# Patient Record
Sex: Female | Born: 1935
Health system: Southern US, Community
[De-identification: ages and names within clinical notes are randomized; demographics above are authoritative.]

## PROBLEM LIST (undated history)

## (undated) DIAGNOSIS — R110 Nausea: Secondary | ICD-10-CM

## (undated) DIAGNOSIS — D649 Anemia, unspecified: Secondary | ICD-10-CM

## (undated) DIAGNOSIS — K219 Gastro-esophageal reflux disease without esophagitis: Secondary | ICD-10-CM

## (undated) DIAGNOSIS — C801 Malignant (primary) neoplasm, unspecified: Secondary | ICD-10-CM

## (undated) DIAGNOSIS — M1990 Unspecified osteoarthritis, unspecified site: Secondary | ICD-10-CM

## (undated) DIAGNOSIS — T7840XA Allergy, unspecified, initial encounter: Secondary | ICD-10-CM

## (undated) DIAGNOSIS — N6012 Diffuse cystic mastopathy of left breast: Secondary | ICD-10-CM

## (undated) DIAGNOSIS — I10 Essential (primary) hypertension: Secondary | ICD-10-CM

## (undated) DIAGNOSIS — C4490 Unspecified malignant neoplasm of skin, unspecified: Secondary | ICD-10-CM

## (undated) DIAGNOSIS — K589 Irritable bowel syndrome without diarrhea: Secondary | ICD-10-CM

## (undated) DIAGNOSIS — E119 Type 2 diabetes mellitus without complications: Secondary | ICD-10-CM

## (undated) DIAGNOSIS — N6011 Diffuse cystic mastopathy of right breast: Secondary | ICD-10-CM

## (undated) DIAGNOSIS — F32A Depression, unspecified: Secondary | ICD-10-CM

## (undated) DIAGNOSIS — F329 Major depressive disorder, single episode, unspecified: Secondary | ICD-10-CM

## (undated) DIAGNOSIS — Z972 Presence of dental prosthetic device (complete) (partial): Secondary | ICD-10-CM

## (undated) DIAGNOSIS — M797 Fibromyalgia: Secondary | ICD-10-CM

## (undated) DIAGNOSIS — N3021 Other chronic cystitis with hematuria: Secondary | ICD-10-CM

## (undated) HISTORY — PX: ESOPHAGOGASTRODUODENOSCOPY: SHX1529

## (undated) HISTORY — PX: APPENDECTOMY: SHX54

## (undated) HISTORY — DX: Unspecified malignant neoplasm of skin, unspecified: C44.90

## (undated) HISTORY — PX: COLONOSCOPY: SHX174

## (undated) HISTORY — DX: Anemia, unspecified: D64.9

## (undated) HISTORY — PX: HERNIA REPAIR: SHX51

## (undated) HISTORY — PX: EYE SURGERY: SHX253

## (undated) HISTORY — PX: ABDOMINAL HYSTERECTOMY: SHX81

## (undated) HISTORY — PX: CHOLECYSTECTOMY: SHX55

---

## 1999-06-07 HISTORY — PX: BREAST BIOPSY: SHX20

## 1999-12-07 ENCOUNTER — Encounter: Admission: RE | Admit: 1999-12-07 | Discharge: 1999-12-07 | Payer: Self-pay | Admitting: General Surgery

## 1999-12-07 ENCOUNTER — Other Ambulatory Visit: Admission: RE | Admit: 1999-12-07 | Discharge: 1999-12-07 | Payer: Self-pay | Admitting: General Surgery

## 1999-12-07 ENCOUNTER — Encounter: Payer: Self-pay | Admitting: General Surgery

## 2000-12-11 ENCOUNTER — Encounter: Admission: RE | Admit: 2000-12-11 | Discharge: 2000-12-11 | Payer: Self-pay | Admitting: Radiology

## 2000-12-11 ENCOUNTER — Encounter: Payer: Self-pay | Admitting: Radiology

## 2001-12-12 ENCOUNTER — Encounter: Payer: Self-pay | Admitting: Family Medicine

## 2001-12-12 ENCOUNTER — Encounter: Admission: RE | Admit: 2001-12-12 | Discharge: 2001-12-12 | Payer: Self-pay | Admitting: Family Medicine

## 2002-12-11 ENCOUNTER — Encounter: Admission: RE | Admit: 2002-12-11 | Discharge: 2002-12-11 | Payer: Self-pay

## 2003-09-10 ENCOUNTER — Encounter: Admission: RE | Admit: 2003-09-10 | Discharge: 2003-09-10 | Payer: Self-pay | Admitting: Internal Medicine

## 2003-12-16 ENCOUNTER — Encounter: Admission: RE | Admit: 2003-12-16 | Discharge: 2003-12-16 | Payer: Self-pay | Admitting: Internal Medicine

## 2004-08-26 ENCOUNTER — Ambulatory Visit: Payer: Self-pay | Admitting: Internal Medicine

## 2004-12-16 ENCOUNTER — Ambulatory Visit: Payer: Self-pay | Admitting: Internal Medicine

## 2004-12-17 ENCOUNTER — Encounter: Admission: RE | Admit: 2004-12-17 | Discharge: 2004-12-17 | Payer: Self-pay | Admitting: Internal Medicine

## 2005-02-10 ENCOUNTER — Ambulatory Visit: Payer: Self-pay | Admitting: Unknown Physician Specialty

## 2005-02-15 ENCOUNTER — Ambulatory Visit: Payer: Self-pay | Admitting: Unknown Physician Specialty

## 2005-12-19 ENCOUNTER — Encounter: Admission: RE | Admit: 2005-12-19 | Discharge: 2005-12-19 | Payer: Self-pay | Admitting: Internal Medicine

## 2006-05-12 ENCOUNTER — Ambulatory Visit: Payer: Self-pay | Admitting: Internal Medicine

## 2006-10-16 ENCOUNTER — Ambulatory Visit: Payer: Self-pay | Admitting: Unknown Physician Specialty

## 2006-12-13 ENCOUNTER — Other Ambulatory Visit: Payer: Self-pay

## 2006-12-13 ENCOUNTER — Emergency Department: Payer: Self-pay | Admitting: Emergency Medicine

## 2006-12-21 ENCOUNTER — Encounter: Admission: RE | Admit: 2006-12-21 | Discharge: 2006-12-21 | Payer: Self-pay | Admitting: Internal Medicine

## 2007-12-24 ENCOUNTER — Encounter: Admission: RE | Admit: 2007-12-24 | Discharge: 2007-12-24 | Payer: Self-pay | Admitting: Internal Medicine

## 2008-12-24 ENCOUNTER — Encounter: Admission: RE | Admit: 2008-12-24 | Discharge: 2008-12-24 | Payer: Self-pay | Admitting: Internal Medicine

## 2009-02-12 ENCOUNTER — Ambulatory Visit: Payer: Self-pay | Admitting: Internal Medicine

## 2009-10-06 ENCOUNTER — Ambulatory Visit: Payer: Self-pay | Admitting: Unknown Physician Specialty

## 2009-11-03 ENCOUNTER — Ambulatory Visit: Payer: Self-pay | Admitting: Unknown Physician Specialty

## 2010-01-12 ENCOUNTER — Encounter: Admission: RE | Admit: 2010-01-12 | Discharge: 2010-01-12 | Payer: Self-pay | Admitting: Internal Medicine

## 2010-03-18 ENCOUNTER — Ambulatory Visit: Payer: Self-pay | Admitting: Internal Medicine

## 2010-12-22 ENCOUNTER — Other Ambulatory Visit: Payer: Self-pay | Admitting: Internal Medicine

## 2010-12-22 DIAGNOSIS — Z1231 Encounter for screening mammogram for malignant neoplasm of breast: Secondary | ICD-10-CM

## 2011-01-19 ENCOUNTER — Ambulatory Visit: Payer: Self-pay

## 2011-01-26 ENCOUNTER — Ambulatory Visit
Admission: RE | Admit: 2011-01-26 | Discharge: 2011-01-26 | Disposition: A | Discharge status: Home / Self Care | Payer: Medicare Other | Source: Ambulatory Visit | Attending: Internal Medicine | Admitting: Internal Medicine

## 2011-01-26 DIAGNOSIS — Z1231 Encounter for screening mammogram for malignant neoplasm of breast: Secondary | ICD-10-CM

## 2011-12-21 ENCOUNTER — Other Ambulatory Visit: Payer: Self-pay | Admitting: Internal Medicine

## 2011-12-21 DIAGNOSIS — Z1231 Encounter for screening mammogram for malignant neoplasm of breast: Secondary | ICD-10-CM

## 2012-02-01 ENCOUNTER — Ambulatory Visit
Admission: RE | Admit: 2012-02-01 | Discharge: 2012-02-01 | Disposition: A | Discharge status: Home / Self Care | Payer: Medicare Other | Source: Ambulatory Visit | Attending: Internal Medicine | Admitting: Internal Medicine

## 2012-02-01 DIAGNOSIS — Z1231 Encounter for screening mammogram for malignant neoplasm of breast: Secondary | ICD-10-CM

## 2012-03-22 ENCOUNTER — Other Ambulatory Visit: Payer: Self-pay | Admitting: Internal Medicine

## 2012-03-22 DIAGNOSIS — M549 Dorsalgia, unspecified: Secondary | ICD-10-CM

## 2012-03-29 ENCOUNTER — Ambulatory Visit
Admission: RE | Admit: 2012-03-29 | Discharge: 2012-03-29 | Disposition: A | Discharge status: Home / Self Care | Payer: Medicare Other | Source: Ambulatory Visit | Attending: Internal Medicine | Admitting: Internal Medicine

## 2012-03-29 DIAGNOSIS — M549 Dorsalgia, unspecified: Secondary | ICD-10-CM

## 2012-11-22 ENCOUNTER — Ambulatory Visit: Payer: Self-pay | Admitting: Internal Medicine

## 2012-12-05 ENCOUNTER — Ambulatory Visit: Payer: Self-pay | Admitting: Internal Medicine

## 2013-02-22 ENCOUNTER — Ambulatory Visit: Payer: Self-pay | Admitting: Orthopedic Surgery

## 2013-03-12 ENCOUNTER — Other Ambulatory Visit: Payer: Self-pay

## 2013-03-12 DIAGNOSIS — Z1231 Encounter for screening mammogram for malignant neoplasm of breast: Secondary | ICD-10-CM

## 2013-04-03 ENCOUNTER — Ambulatory Visit: Payer: Medicare Other

## 2013-04-05 ENCOUNTER — Ambulatory Visit
Admission: RE | Admit: 2013-04-05 | Discharge: 2013-04-05 | Disposition: A | Discharge status: Home / Self Care | Payer: Medicare Other | Source: Ambulatory Visit

## 2013-04-05 DIAGNOSIS — Z1231 Encounter for screening mammogram for malignant neoplasm of breast: Secondary | ICD-10-CM

## 2014-03-17 ENCOUNTER — Other Ambulatory Visit: Payer: Self-pay

## 2014-03-17 DIAGNOSIS — Z1231 Encounter for screening mammogram for malignant neoplasm of breast: Secondary | ICD-10-CM

## 2014-04-07 ENCOUNTER — Ambulatory Visit
Admission: RE | Admit: 2014-04-07 | Discharge: 2014-04-07 | Disposition: A | Discharge status: Home / Self Care | Payer: Commercial Managed Care - HMO | Source: Ambulatory Visit

## 2014-04-07 DIAGNOSIS — Z1231 Encounter for screening mammogram for malignant neoplasm of breast: Secondary | ICD-10-CM

## 2014-07-09 ENCOUNTER — Ambulatory Visit: Payer: Self-pay | Admitting: Internal Medicine

## 2014-12-25 ENCOUNTER — Other Ambulatory Visit: Payer: Self-pay | Admitting: Internal Medicine

## 2014-12-25 ENCOUNTER — Ambulatory Visit
Admission: RE | Admit: 2014-12-25 | Discharge: 2014-12-25 | Disposition: A | Discharge status: Home / Self Care | Payer: Medicare PPO | Source: Ambulatory Visit | Attending: Internal Medicine | Admitting: Internal Medicine

## 2014-12-25 DIAGNOSIS — M7989 Other specified soft tissue disorders: Secondary | ICD-10-CM | POA: Insufficient documentation

## 2014-12-25 DIAGNOSIS — I82812 Embolism and thrombosis of superficial veins of left lower extremities: Secondary | ICD-10-CM | POA: Insufficient documentation

## 2014-12-25 DIAGNOSIS — L97929 Non-pressure chronic ulcer of unspecified part of left lower leg with unspecified severity: Secondary | ICD-10-CM | POA: Diagnosis present

## 2015-02-05 ENCOUNTER — Other Ambulatory Visit: Payer: Self-pay | Admitting: Internal Medicine

## 2015-02-05 DIAGNOSIS — R1013 Epigastric pain: Secondary | ICD-10-CM

## 2015-02-13 ENCOUNTER — Ambulatory Visit
Admission: RE | Admit: 2015-02-13 | Discharge: 2015-02-13 | Disposition: A | Discharge status: Home / Self Care | Payer: Medicare PPO | Source: Ambulatory Visit | Attending: Internal Medicine | Admitting: Internal Medicine

## 2015-02-13 DIAGNOSIS — R1013 Epigastric pain: Secondary | ICD-10-CM | POA: Diagnosis present

## 2015-02-13 DIAGNOSIS — I700 Atherosclerosis of aorta: Secondary | ICD-10-CM | POA: Insufficient documentation

## 2015-02-13 DIAGNOSIS — Z9049 Acquired absence of other specified parts of digestive tract: Secondary | ICD-10-CM | POA: Diagnosis not present

## 2015-02-13 DIAGNOSIS — I728 Aneurysm of other specified arteries: Secondary | ICD-10-CM | POA: Diagnosis not present

## 2015-02-13 MED ORDER — IOHEXOL 300 MG/ML  SOLN
100.0000 mL | Freq: Once | INTRAMUSCULAR | Status: AC | PRN
  Administered 2015-02-13: 100 mL via INTRAVENOUS

## 2015-04-17 ENCOUNTER — Other Ambulatory Visit: Payer: Self-pay

## 2015-04-17 DIAGNOSIS — Z1231 Encounter for screening mammogram for malignant neoplasm of breast: Secondary | ICD-10-CM

## 2015-05-14 DIAGNOSIS — N3941 Urge incontinence: Secondary | ICD-10-CM | POA: Insufficient documentation

## 2015-05-14 DIAGNOSIS — N3020 Other chronic cystitis without hematuria: Secondary | ICD-10-CM | POA: Insufficient documentation

## 2015-05-25 ENCOUNTER — Ambulatory Visit
Admission: RE | Admit: 2015-05-25 | Discharge: 2015-05-25 | Disposition: A | Discharge status: Home / Self Care | Payer: Medicare PPO | Source: Ambulatory Visit

## 2015-05-25 DIAGNOSIS — Z1231 Encounter for screening mammogram for malignant neoplasm of breast: Secondary | ICD-10-CM

## 2015-07-28 ENCOUNTER — Other Ambulatory Visit: Payer: Self-pay | Admitting: Internal Medicine

## 2015-07-28 DIAGNOSIS — R1011 Right upper quadrant pain: Secondary | ICD-10-CM

## 2015-07-28 DIAGNOSIS — K5900 Constipation, unspecified: Secondary | ICD-10-CM

## 2015-07-28 DIAGNOSIS — D649 Anemia, unspecified: Secondary | ICD-10-CM

## 2015-07-30 ENCOUNTER — Other Ambulatory Visit: Payer: Self-pay | Admitting: *Deleted

## 2015-07-30 DIAGNOSIS — R1011 Right upper quadrant pain: Secondary | ICD-10-CM

## 2015-07-30 DIAGNOSIS — K219 Gastro-esophageal reflux disease without esophagitis: Secondary | ICD-10-CM | POA: Insufficient documentation

## 2015-07-31 ENCOUNTER — Ambulatory Visit: Payer: Medicare PPO

## 2015-08-07 ENCOUNTER — Ambulatory Visit
Admission: RE | Admit: 2015-08-07 | Discharge: 2015-08-07 | Disposition: A | Discharge status: Home / Self Care | Payer: Medicare PPO | Source: Ambulatory Visit | Attending: *Deleted | Admitting: *Deleted

## 2015-08-07 DIAGNOSIS — R1011 Right upper quadrant pain: Secondary | ICD-10-CM | POA: Diagnosis present

## 2015-08-07 DIAGNOSIS — Z9049 Acquired absence of other specified parts of digestive tract: Secondary | ICD-10-CM | POA: Diagnosis not present

## 2015-08-26 ENCOUNTER — Encounter: Payer: Self-pay | Admitting: *Deleted

## 2015-08-27 ENCOUNTER — Ambulatory Visit: Payer: Medicare PPO | Admitting: Anesthesiology

## 2015-08-27 ENCOUNTER — Encounter: Payer: Self-pay | Admitting: *Deleted

## 2015-08-27 ENCOUNTER — Encounter
Admission: RE | Disposition: A | Discharge status: Home / Self Care | Payer: Self-pay | Source: Ambulatory Visit | Attending: Unknown Physician Specialty

## 2015-08-27 ENCOUNTER — Ambulatory Visit
Admission: RE | Admit: 2015-08-27 | Discharge: 2015-08-27 | Disposition: A | Discharge status: Home / Self Care | Payer: Medicare PPO | Source: Ambulatory Visit | Attending: Unknown Physician Specialty | Admitting: Unknown Physician Specialty

## 2015-08-27 DIAGNOSIS — Z9049 Acquired absence of other specified parts of digestive tract: Secondary | ICD-10-CM | POA: Insufficient documentation

## 2015-08-27 DIAGNOSIS — Z90710 Acquired absence of both cervix and uterus: Secondary | ICD-10-CM | POA: Insufficient documentation

## 2015-08-27 DIAGNOSIS — Z7984 Long term (current) use of oral hypoglycemic drugs: Secondary | ICD-10-CM | POA: Diagnosis not present

## 2015-08-27 DIAGNOSIS — E119 Type 2 diabetes mellitus without complications: Secondary | ICD-10-CM | POA: Diagnosis not present

## 2015-08-27 DIAGNOSIS — R110 Nausea: Secondary | ICD-10-CM | POA: Diagnosis not present

## 2015-08-27 DIAGNOSIS — Z881 Allergy status to other antibiotic agents status: Secondary | ICD-10-CM | POA: Insufficient documentation

## 2015-08-27 DIAGNOSIS — Z825 Family history of asthma and other chronic lower respiratory diseases: Secondary | ICD-10-CM | POA: Insufficient documentation

## 2015-08-27 DIAGNOSIS — K209 Esophagitis, unspecified: Secondary | ICD-10-CM | POA: Insufficient documentation

## 2015-08-27 DIAGNOSIS — R1011 Right upper quadrant pain: Secondary | ICD-10-CM | POA: Diagnosis not present

## 2015-08-27 DIAGNOSIS — Z79899 Other long term (current) drug therapy: Secondary | ICD-10-CM | POA: Insufficient documentation

## 2015-08-27 DIAGNOSIS — K210 Gastro-esophageal reflux disease with esophagitis: Secondary | ICD-10-CM | POA: Diagnosis not present

## 2015-08-27 DIAGNOSIS — F329 Major depressive disorder, single episode, unspecified: Secondary | ICD-10-CM | POA: Insufficient documentation

## 2015-08-27 DIAGNOSIS — K219 Gastro-esophageal reflux disease without esophagitis: Secondary | ICD-10-CM | POA: Diagnosis not present

## 2015-08-27 DIAGNOSIS — K589 Irritable bowel syndrome without diarrhea: Secondary | ICD-10-CM | POA: Diagnosis not present

## 2015-08-27 DIAGNOSIS — K640 First degree hemorrhoids: Secondary | ICD-10-CM | POA: Diagnosis not present

## 2015-08-27 DIAGNOSIS — Z833 Family history of diabetes mellitus: Secondary | ICD-10-CM | POA: Diagnosis not present

## 2015-08-27 DIAGNOSIS — Z85828 Personal history of other malignant neoplasm of skin: Secondary | ICD-10-CM | POA: Insufficient documentation

## 2015-08-27 DIAGNOSIS — Z87891 Personal history of nicotine dependence: Secondary | ICD-10-CM | POA: Diagnosis not present

## 2015-08-27 DIAGNOSIS — K644 Residual hemorrhoidal skin tags: Secondary | ICD-10-CM | POA: Diagnosis not present

## 2015-08-27 DIAGNOSIS — K2960 Other gastritis without bleeding: Secondary | ICD-10-CM | POA: Insufficient documentation

## 2015-08-27 DIAGNOSIS — R1310 Dysphagia, unspecified: Secondary | ICD-10-CM | POA: Insufficient documentation

## 2015-08-27 DIAGNOSIS — Z801 Family history of malignant neoplasm of trachea, bronchus and lung: Secondary | ICD-10-CM | POA: Insufficient documentation

## 2015-08-27 DIAGNOSIS — Z7951 Long term (current) use of inhaled steroids: Secondary | ICD-10-CM | POA: Diagnosis not present

## 2015-08-27 DIAGNOSIS — Z820 Family history of epilepsy and other diseases of the nervous system: Secondary | ICD-10-CM | POA: Diagnosis not present

## 2015-08-27 DIAGNOSIS — D120 Benign neoplasm of cecum: Secondary | ICD-10-CM | POA: Insufficient documentation

## 2015-08-27 DIAGNOSIS — G43909 Migraine, unspecified, not intractable, without status migrainosus: Secondary | ICD-10-CM | POA: Diagnosis not present

## 2015-08-27 DIAGNOSIS — N3020 Other chronic cystitis without hematuria: Secondary | ICD-10-CM | POA: Diagnosis not present

## 2015-08-27 DIAGNOSIS — I10 Essential (primary) hypertension: Secondary | ICD-10-CM | POA: Diagnosis not present

## 2015-08-27 DIAGNOSIS — Z8249 Family history of ischemic heart disease and other diseases of the circulatory system: Secondary | ICD-10-CM | POA: Diagnosis not present

## 2015-08-27 DIAGNOSIS — N6019 Diffuse cystic mastopathy of unspecified breast: Secondary | ICD-10-CM | POA: Insufficient documentation

## 2015-08-27 DIAGNOSIS — M1990 Unspecified osteoarthritis, unspecified site: Secondary | ICD-10-CM | POA: Insufficient documentation

## 2015-08-27 DIAGNOSIS — Z882 Allergy status to sulfonamides status: Secondary | ICD-10-CM | POA: Diagnosis not present

## 2015-08-27 DIAGNOSIS — M797 Fibromyalgia: Secondary | ICD-10-CM | POA: Insufficient documentation

## 2015-08-27 HISTORY — DX: Malignant (primary) neoplasm, unspecified: C80.1

## 2015-08-27 HISTORY — DX: Fibromyalgia: M79.7

## 2015-08-27 HISTORY — DX: Type 2 diabetes mellitus without complications: E11.9

## 2015-08-27 HISTORY — DX: Irritable bowel syndrome, unspecified: K58.9

## 2015-08-27 HISTORY — DX: Diffuse cystic mastopathy of right breast: N60.11

## 2015-08-27 HISTORY — PX: ESOPHAGOGASTRODUODENOSCOPY (EGD) WITH PROPOFOL: SHX5813

## 2015-08-27 HISTORY — DX: Depression, unspecified: F32.A

## 2015-08-27 HISTORY — DX: Gastro-esophageal reflux disease without esophagitis: K21.9

## 2015-08-27 HISTORY — PX: COLONOSCOPY WITH PROPOFOL: SHX5780

## 2015-08-27 HISTORY — DX: Major depressive disorder, single episode, unspecified: F32.9

## 2015-08-27 HISTORY — DX: Unspecified osteoarthritis, unspecified site: M19.90

## 2015-08-27 HISTORY — DX: Diffuse cystic mastopathy of left breast: N60.12

## 2015-08-27 HISTORY — DX: Essential (primary) hypertension: I10

## 2015-08-27 HISTORY — DX: Diffuse cystic mastopathy of right breast: N60.12

## 2015-08-27 LAB — GLUCOSE, CAPILLARY: Glucose-Capillary: 171 mg/dL — ABNORMAL HIGH (ref 65–99)

## 2015-08-27 SURGERY — COLONOSCOPY WITH PROPOFOL
Anesthesia: General

## 2015-08-27 MED ORDER — FENTANYL CITRATE (PF) 100 MCG/2ML IJ SOLN
INTRAMUSCULAR | Status: DC | PRN
  Administered 2015-08-27: 50 ug via INTRAVENOUS

## 2015-08-27 MED ORDER — PROPOFOL 500 MG/50ML IV EMUL
INTRAVENOUS | Status: DC | PRN
  Administered 2015-08-27: 180 ug/kg/min via INTRAVENOUS

## 2015-08-27 MED ORDER — LIDOCAINE HCL (CARDIAC) 20 MG/ML IV SOLN
INTRAVENOUS | Status: DC | PRN
  Administered 2015-08-27: 30 mg via INTRAVENOUS

## 2015-08-27 MED ORDER — MIDAZOLAM HCL 5 MG/5ML IJ SOLN
INTRAMUSCULAR | Status: DC | PRN
  Administered 2015-08-27: 1 mg via INTRAVENOUS

## 2015-08-27 MED ORDER — PROPOFOL 10 MG/ML IV BOLUS
INTRAVENOUS | Status: DC | PRN
  Administered 2015-08-27: 50 mg via INTRAVENOUS

## 2015-08-27 MED ORDER — SODIUM CHLORIDE 0.9 % IV SOLN: INTRAVENOUS | Status: DC

## 2015-08-27 MED ORDER — SODIUM CHLORIDE 0.9 % IV SOLN
INTRAVENOUS | Status: DC
  Administered 2015-08-27: 08:00:00 via INTRAVENOUS

## 2015-08-27 NOTE — Op Note (Signed)
[facility] Gastroenterology Patient Name: Kim Ballard Procedure Date: 08/27/2015 8:48 AM MRN: [id number] Account #: [id number] Date of Birth: [birth year] Admit Type: Outpatient Age: 81 Room: [facility] ENDO ROOM 1 Gender: Female Note Status: Finalized Procedure:            Upper GI endoscopy Indications:          Dysphagia, Heartburn Providers:            Manya Silvas, MD Referring MD:         Tracie Harrier, MD (Referring MD) Medicines:            Propofol per Anesthesia Complications:        No immediate complications. Procedure:            Pre-Anesthesia Assessment:                       - After reviewing the risks and benefits, the patient                        was deemed in satisfactory condition to undergo the                        procedure.                       After obtaining informed consent, the endoscope was                        passed under direct vision. Throughout the procedure,                        the patient's blood pressure, pulse, and oxygen                        saturations were monitored continuously. The Endoscope                        was introduced through the mouth, and advanced to the                        second part of duodenum. The upper GI endoscopy was                        accomplished without difficulty. The patient tolerated                        the procedure well. Findings:      LA Grade A (one or more mucosal breaks less than 5 mm, not extending       between tops of 2 mucosal folds) esophagitis with no bleeding was found       36 cm from the incisors. Biopsies were taken with a cold forceps for       histology.      Diffuse mildly erythematous mucosa without bleeding was found in the       gastric body and in the gastric antrum. Biopsies were taken with a cold       forceps for histology. Biopsies were taken with a cold forceps for       Helicobacter pylori testing.      The examined duodenum was normal.   Due to dysphagia I passed  a guide wire into small bowel and then a 21F       Savary dilator over the wire with mild resistance. Impression:           - LA Grade A reflux esophagitis. Rule out Barrett's                        esophagus. Biopsied.                       - Erythematous mucosa in the gastric body and antrum.                        Biopsied.                       - Normal examined duodenum. Recommendation:       - Perform a colonoscopy as previously scheduled. Manya Silvas, MD 08/27/2015 9:04:53 AM This report has been signed electronically. Number of Addenda: 0 Note Initiated On: 08/27/2015 8:48 AM      [facility]

## 2015-08-27 NOTE — Anesthesia Postprocedure Evaluation (Signed)
Anesthesia Post Note  Patient: Kim Ballard  Procedure(s) Performed: Procedure(s) (LRB): COLONOSCOPY WITH PROPOFOL (N/A) ESOPHAGOGASTRODUODENOSCOPY (EGD) WITH PROPOFOL (N/A)  Patient location during evaluation: PACU Anesthesia Type: General Level of consciousness: awake and alert Pain management: pain level controlled Vital Signs Assessment: post-procedure vital signs reviewed and stable Respiratory status: spontaneous breathing and respiratory function stable Cardiovascular status: blood pressure returned to baseline and stable Anesthetic complications: no    Last Vitals:  Filed Vitals:   08/27/15 0950 08/27/15 0958  BP: 133/65 162/74  Pulse: 65 66  Temp:    Resp: 13 12    Last Pain: There were no vitals filed for this visit.               KEPHART,WILLIAM K

## 2015-08-27 NOTE — Anesthesia Preprocedure Evaluation (Signed)
Anesthesia Evaluation  Patient identified by MRN, date of birth, ID band Patient awake    Reviewed: Allergy & Precautions, NPO status , Patient's Chart, lab work & pertinent test results  History of Anesthesia Complications Negative for: history of anesthetic complications  Airway Mallampati: II       Dental  (+) Upper Dentures, Lower Dentures   Pulmonary neg pulmonary ROS, former smoker,           Cardiovascular hypertension, Pt. on medications      Neuro/Psych Depression negative neurological ROS     GI/Hepatic Neg liver ROS, GERD  Medicated and Poorly Controlled,  Endo/Other  diabetes, Type 2, Oral Hypoglycemic Agents  Renal/GU Renal disease (UTIs)negative Renal ROS     Musculoskeletal  (+) Arthritis , Osteoarthritis,  Fibromyalgia -, narcotic dependent  Abdominal   Peds  Hematology   Anesthesia Other Findings   Reproductive/Obstetrics                             Anesthesia Physical Anesthesia Plan  ASA: II  Anesthesia Plan: General   Post-op Pain Management:    Induction: Intravenous  Airway Management Planned: Nasal Cannula  Additional Equipment:   Intra-op Plan:   Post-operative Plan:   Informed Consent: I have reviewed the patients History and Physical, chart, labs and discussed the procedure including the risks, benefits and alternatives for the proposed anesthesia with the patient or authorized representative who has indicated his/her understanding and acceptance.     Plan Discussed with:   Anesthesia Plan Comments:         Anesthesia Quick Evaluation

## 2015-08-27 NOTE — Transfer of Care (Signed)
Immediate Anesthesia Transfer of Care Note  Patient: Kim Ballard  Procedure(s) Performed: Procedure(s): COLONOSCOPY WITH PROPOFOL (N/A) ESOPHAGOGASTRODUODENOSCOPY (EGD) WITH PROPOFOL (N/A)  Patient Location: PACU  Anesthesia Type:General  Level of Consciousness: sedated  Airway & Oxygen Therapy: Patient Spontanous Breathing  Post-op Assessment: Report given to RN  Post vital signs: Reviewed  Last Vitals:  Filed Vitals:   08/27/15 0934 08/27/15 0940  BP: 106/58 106/58  Pulse: 80   Temp: 36 C   Resp: 14     Complications: No apparent anesthesia complications

## 2015-08-27 NOTE — H&P (Signed)
Primary Care Physician:  Tracie Harrier, MD Primary Gastroenterologist:  Dr. Vira Agar  Pre-Procedure History & Physical: HPI:  Kim Ballard is a 81 y.o. female is here for an endoscopy and colonoscopy.   Past Medical History  Diagnosis Date  . Depression   . Diabetes mellitus without complication ([place])   . Fibromyalgia   . Fibrocystic disease of both breasts   . GERD (gastroesophageal reflux disease)   . Hypertension   . IBS (irritable bowel syndrome)   . Headache   . Arthritis   . Cancer [facility])     skin cancers    Past Surgical History  Procedure Laterality Date  . Abdominal hysterectomy    . Cholecystectomy    . Hernia repair    . Appendectomy    . Colonoscopy    . Esophagogastroduodenoscopy      Prior to Admission medications   Medication Sig Start Date End Date Taking? Authorizing Provider  albuterol (PROVENTIL HFA;VENTOLIN HFA) 108 (90 Base) MCG/ACT inhaler Inhale into the lungs every 6 (six) hours as needed for wheezing or shortness of breath.   Yes Historical Provider, MD  clidinium-chlordiazePOXIDE (LIBRAX) 5-2.5 MG capsule Take 1 capsule by mouth.   Yes Historical Provider, MD  CRANBERRY EXTRACT PO Take by mouth.   Yes Historical Provider, MD  ferrous sulfate 325 (65 FE) MG tablet Take 325 mg by mouth daily with breakfast.   Yes Historical Provider, MD  fluticasone (FLONASE) 50 MCG/ACT nasal spray Place into both nostrils daily.   Yes Historical Provider, MD  gemfibrozil (LOPID) 600 MG tablet Take 600 mg by mouth 2 (two) times daily before a meal.   Yes Historical Provider, MD  glipiZIDE (GLUCOTROL) 5 MG tablet Take by mouth daily before breakfast.   Yes Historical Provider, MD  hydrochlorothiazide (HYDRODIURIL) 25 MG tablet Take 25 mg by mouth daily.   Yes Historical Provider, MD  Lactobacillus Rhamnosus, GG, ([employer] PROBIOTIC, LACTOBACILLUS, PO) Take by mouth.   Yes Historical Provider, MD  metFORMIN (GLUCOPHAGE) 500 MG tablet Take by mouth 2 (two) times daily  with a meal.   Yes Historical Provider, MD  Multiple Vitamins-Minerals (MULTIVITAMIN WITH MINERALS) tablet Take 1 tablet by mouth daily.   Yes Historical Provider, MD  OMEGA-3 FATTY ACIDS PO Take by mouth.   Yes Historical Provider, MD  sucralfate (CARAFATE) 1 g tablet Take 1 g by mouth 4 (four) times daily -  with meals and at bedtime.   Yes Historical Provider, MD  vitamin B-12 (CYANOCOBALAMIN) 100 MCG tablet Take 100 mcg by mouth daily.   Yes Historical Provider, MD  vitamin E 100 UNIT capsule Take 50 Units by mouth daily.   Yes Historical Provider, MD    Allergies as of 08/13/2015  . (Not on File)    History reviewed. No pertinent family history.  Social History   Social History  . Marital Status: Married    Spouse Name: N/A  . Number of Children: N/A  . Years of Education: N/A   Occupational History  . Not on file.   Social History Main Topics  . Smoking status: Former Research scientist (life sciences)  . Smokeless tobacco: Not on file  . Alcohol Use: No  . Drug Use: No  . Sexual Activity: Not on file   Other Topics Concern  . Not on file   Social History Narrative    Review of Systems: See HPI, otherwise negative ROS  Physical Exam: BP 165/74 mmHg  Pulse 73  Temp(Src) 97.8 F (36.6 C) (Oral)  Resp 18  Ht 5' 7.5" (1.715 m)  Wt 70.308 kg (155 lb)  BMI 23.90 kg/m2  SpO2 100% General:   Alert,  pleasant and cooperative in NAD Head:  Normocephalic and atraumatic. Neck:  Supple; no masses or thyromegaly. Lungs:  Clear throughout to auscultation.    Heart:  Regular rate and rhythm. Abdomen:  Soft, nontender and nondistended. Normal bowel sounds, without guarding, and without rebound.   Neurologic:  Alert and  oriented x4;  grossly normal neurologically.  Impression/Plan: Kim Ballard is here for an endoscopy and colonoscopy to be performed for [facility] colon polyps AND FOR gerd, RUQ abd pain.  Risks, benefits, limitations, and alternatives regarding  endoscopy and colonoscopy have been  reviewed with the patient.  Questions have been answered.  All parties agreeable.   Gaylyn Cheers, MD  08/27/2015, 8:44 AM

## 2015-08-27 NOTE — Op Note (Signed)
[facility] Gastroenterology Patient Name: Kim Ballard Procedure Date: 08/27/2015 8:47 AM MRN: [id number] Account #: [id number] Date of Birth: 1935/04/04 Admit Type: Outpatient Age: 81 Room: [facility] ENDO ROOM 1 Gender: Female Note Status: Finalized Procedure:            Colonoscopy Indications:          Abdominal pain in the right upper quadrant Providers:            Manya Silvas, MD Referring MD:         Tracie Harrier, MD (Referring MD) Medicines:            Propofol per Anesthesia Complications:        No immediate complications. Procedure:            Pre-Anesthesia Assessment:                       - After reviewing the risks and benefits, the patient                        was deemed in satisfactory condition to undergo the                        procedure.                       After obtaining informed consent, the colonoscope was                        passed under direct vision. Throughout the procedure,                        the patient's blood pressure, pulse, and oxygen                        saturations were monitored continuously. The                        Colonoscope was introduced through the anus and                        advanced to the the cecum, identified by appendiceal                        orifice and ileocecal valve. The colonoscopy was                        performed without difficulty. The patient tolerated the                        procedure well. The quality of the bowel preparation                        was good. Findings:      A medium polyp was found in the cecum. The polyp was sessile. The polyp       was removed with a hot snare. Resection and retrieval were complete.      The exam was otherwise without abnormality.      One clip applied to the site.      External hemorrhoids were found during endoscopy. The hemorrhoids were       large and  Grade I (internal hemorrhoids that do not prolapse). Impression:           -  One medium polyp in the cecum, removed with a hot                        snare. Resected and retrieved.                       - The examination was otherwise normal. Recommendation:       - Await pathology results. Manya Silvas, MD 08/27/2015 9:32:05 AM This report has been signed electronically. Number of Addenda: 0 Note Initiated On: 08/27/2015 8:47 AM Scope Withdrawal Time: 0 hours 13 minutes 35 seconds  Total Procedure Duration: 0 hours 21 minutes 2 seconds       [facility]

## 2015-08-28 LAB — SURGICAL PATHOLOGY

## 2015-08-29 ENCOUNTER — Encounter: Payer: Self-pay | Admitting: Unknown Physician Specialty

## 2015-10-21 ENCOUNTER — Other Ambulatory Visit: Payer: Self-pay | Admitting: Unknown Physician Specialty

## 2015-10-21 DIAGNOSIS — R1084 Generalized abdominal pain: Secondary | ICD-10-CM

## 2015-10-28 ENCOUNTER — Ambulatory Visit
Admission: RE | Admit: 2015-10-28 | Discharge: 2015-10-28 | Disposition: A | Discharge status: Home / Self Care | Payer: Medicare PPO | Source: Ambulatory Visit | Attending: Unknown Physician Specialty | Admitting: Unknown Physician Specialty

## 2015-10-28 DIAGNOSIS — K5790 Diverticulosis of intestine, part unspecified, without perforation or abscess without bleeding: Secondary | ICD-10-CM | POA: Diagnosis not present

## 2015-10-28 DIAGNOSIS — Z9049 Acquired absence of other specified parts of digestive tract: Secondary | ICD-10-CM | POA: Diagnosis not present

## 2015-10-28 DIAGNOSIS — I700 Atherosclerosis of aorta: Secondary | ICD-10-CM | POA: Diagnosis not present

## 2015-10-28 DIAGNOSIS — R1084 Generalized abdominal pain: Secondary | ICD-10-CM | POA: Insufficient documentation

## 2015-10-28 DIAGNOSIS — K449 Diaphragmatic hernia without obstruction or gangrene: Secondary | ICD-10-CM | POA: Insufficient documentation

## 2015-10-28 MED ORDER — IOPAMIDOL (ISOVUE-300) INJECTION 61%
85.0000 mL | Freq: Once | INTRAVENOUS | Status: AC | PRN
  Administered 2015-10-28: 85 mL via INTRAVENOUS

## 2016-01-17 NOTE — Progress Notes (Signed)
[facility] day:  01/18/2016  Chief Complaint: Kim Ballard is a 81 y.o. female with pancytopenia who is referred by Dr. Tracie Harrier for assessment and management.  HPI:  The patient notes a history of an up and down platelet count.   She states that years ago she was sent to [facility] for thrombocytopenia.  She had an EBV infection. They did not find anything wrong. Platelet count has ranged between and 125,000 and 152,000 between 11/12/2013 and 12/15/2015 without trend.  She denied any bruising or bleeding.    She has had anemia since 12/24/2014.  Hematocrit was hovering between 32.3 - 34 until recently. She been on oral iron for the past 2 months. Iron causes constipation.  She previously took over-the-counter iron for 1 -  2 years.  She underwent EGD and colonoscopy on 08/27/2015 by Dr. Vira Agar.  She had one medium polyp in the cecum (tubulovillous adenoma).  She had LA grade A reflux esophagitis and erythematous mucosa in the gastric body and antrum.  Gastric biopsy revealed healing mucosal injury and focal intestinal metaplasia.   There was iron pill gastritis.  She states her diet is "okay".  She eats chicken or beef every other day. She eats green leafy vegetables. She denies any cravings.  CBC on 08/18/2015 revealed a hematocrit of 33, hemoglobin 11.1, MCV 92.7, platelets 152,000 WBC 5200 with an ANC of 3390.  Differential  Was unremarkable.    CBC on 12/15/2015 revealed a hematocrit 29.0, hemoglobin 10, MCV 90.6, platelets 137,000, white count 3900 with an ANC of 2390. Differential included 61% segs, 23% lymphs, 8% monocytes, 6% eosinophils, and 1% basophils. Absolute lymphocyte count was 910 (slightly low).  Abdominal and pelvic CT scan on 10/28/2015 revealed no hepatosplenomegaly.  Symptomatically, she feels tired but alright. She states that she has been under a lot of stress. Her husband has been bedridden for 3 years. She is the sole  provider. She has poor sleep.  She denies any B symptoms.  She notes fibromyalgia.     Past Medical History:  Diagnosis Date  . Anemia   . Arthritis   . Cancer ([place])    skin cancers  . Depression   . Diabetes mellitus without complication ([place])   . Fibrocystic disease of both breasts   . Fibromyalgia   . GERD (gastroesophageal reflux disease)   . Headache   . Hypertension   . IBS (irritable bowel syndrome)   . Skin cancer     Past Surgical History:  Procedure Laterality Date  . ABDOMINAL HYSTERECTOMY    . APPENDECTOMY    . CHOLECYSTECTOMY    . COLONOSCOPY    . COLONOSCOPY WITH PROPOFOL N/A 08/27/2015   Procedure: COLONOSCOPY WITH PROPOFOL;  Surgeon: Manya Silvas, MD;  Location: [facility] ENDOSCOPY;  Service: Endoscopy;  Laterality: N/A;  . ESOPHAGOGASTRODUODENOSCOPY    . ESOPHAGOGASTRODUODENOSCOPY (EGD) WITH PROPOFOL N/A 08/27/2015   Procedure: ESOPHAGOGASTRODUODENOSCOPY (EGD) WITH PROPOFOL;  Surgeon: Manya Silvas, MD;  Location: [facility] ENDOSCOPY;  Service: Endoscopy;  Laterality: N/A;  . HERNIA REPAIR      Family History  Problem Relation Age of Onset  . Hypertension Mother   . Ovarian cancer Paternal Grandmother     Social History:  reports that she has quit smoking. She does not have any smokeless tobacco history on file. She reports that she does not drink alcohol or use drugs.  She lives in [place].  Her husband has been bedridden  for 3 years. He has Parkinson's and dementia.  She is his primary caregiver.  The patient is alone today.  Allergies:  Allergies  Allergen Reactions  . Biaxin [Clarithromycin] Other (See Comments)    GI UPSET   . Brompheniramine Maleate   . Ceclor [Cefaclor]   . Ciprofloxacin   . Clinoril [Sulindac]   . Codeine Sulfate   . Diovan [Valsartan]   . Doxycycline   . Erythromycin   . Esomeprazole   . Guaifenesin & Derivatives   . Levaquin [Levofloxacin In D5w]   . Lisinopril   . Lodine [Etodolac]   . Macrodantin [Nitrofurantoin  Macrocrystal]   . Medrol [Methylprednisolone]   . Mobic [Meloxicam]   . Motrin [Ibuprofen]   . Nexium [Esomeprazole Magnesium]   . Norvasc [Amlodipine Besylate]   . Omnicef [Cefdinir]   . Penicillins   . Prozac [Fluoxetine Hcl] Other (See Comments)    Makes her feel crazy   . Pseudoephedrine   . Reglan [Metoclopramide]   . Seldane [Terfenadine]   . Toprol Xl [Metoprolol Succinate Er]   . Triamterene   . Tussionex Pennkinetic Er [Hydrocod Polst-Cpm Polst Er]   . Vibramycin [Doxycycline Calcium]     Current Medications: Current Outpatient Prescriptions  Medication Sig Dispense Refill  . albuterol (PROVENTIL HFA;VENTOLIN HFA) 108 (90 Base) MCG/ACT inhaler Inhale into the lungs every 6 (six) hours as needed for wheezing or shortness of breath.    . cephALEXin (KEFLEX) 250 MG capsule Take by mouth.    . clidinium-chlordiazePOXIDE (LIBRAX) 5-2.5 MG capsule Take 1 capsule by mouth.    Drusilla Kanner EXTRACT PO Take by mouth.    . fluticasone (FLONASE) 50 MCG/ACT nasal spray Place into both nostrils daily.    Marland Kitchen gemfibrozil (LOPID) 600 MG tablet Take 600 mg by mouth 2 (two) times daily before a meal.    . glipiZIDE (GLUCOTROL) 5 MG tablet Take by mouth daily before breakfast.    . hydrochlorothiazide (HYDRODIURIL) 25 MG tablet Take 25 mg by mouth daily.    . iron polysaccharides (NIFEREX) 150 MG capsule Take by mouth.    . Lactobacillus Rhamnosus, GG, ([employer] PROBIOTIC, LACTOBACILLUS, PO) Take by mouth.    . metFORMIN (GLUCOPHAGE) 500 MG tablet Take by mouth 2 (two) times daily with a meal.    . Multiple Vitamins-Minerals (MULTIVITAMIN WITH MINERALS) tablet Take 1 tablet by mouth daily.    . OMEGA-3 FATTY ACIDS PO Take by mouth.    . pantoprazole (PROTONIX) 40 MG tablet Take 40 mg by mouth 2 (two) times daily.    . sucralfate (CARAFATE) 1 g tablet Take 1 g by mouth 4 (four) times daily -  with meals and at bedtime.    Marland Kitchen UNABLE TO FIND TEST EVERY DAY AS DIRECTED    . vitamin B-12  (CYANOCOBALAMIN) 100 MCG tablet Take 100 mcg by mouth daily.    . vitamin E 100 UNIT capsule Take 50 Units by mouth daily.     No current facility-administered medications for this visit.     Review of Systems:  GENERAL:  Fatigue.  No fevers, sweats or weight loss. PERFORMANCE STATUS (ECOG):  1 HEENT:  Cataracts.  No visual changes, runny nose, sore throat, mouth sores or tenderness. Lungs: No shortness of breath or cough.  No hemoptysis. Cardiac:  No chest pain, palpitations, orthopnea, or PND. GI:  Constipation.  No nausea, vomiting, diarrhea, melena or hematochezia. GU:  No urgency, frequency, dysuria, or hematuria. Musculoskeletal:  Fibromyalgia.  No back pain.  No joint pain.  No muscle tenderness. Extremities:  No pain or swelling. Skin:  No rashes or skin changes. Neuro:  No headache, numbness or weakness, balance or coordination issues. Endocrine:  Diabetes.  No thyroid issues, hot flashes or night sweats. Psych:  Lots of stress.  No mood changes, depression or anxiety. Pain:  No focal pain. Review of systems:  All other systems reviewed and found to be negative.  Physical Exam: Blood pressure (!) 165/82, pulse 74, temperature (!) 96.4 F (35.8 C), temperature source Tympanic, resp. rate 18, SpO2 99 %. GENERAL:  Well developed, well nourished, sitting comfortably in the exam room in no acute distress. MENTAL STATUS:  Alert and oriented to person, place and time. HEAD:  Short dark hair.  Normocephalic, atraumatic, face symmetric, no Cushingoid features. EYES:  Green eyes.  Pupils equal round and reactive to light and accomodation.  No conjunctivitis or scleral icterus. ENT:  Oropharynx clear without lesion.  Dentures.  Tongue normal. Mucous membranes moist.  RESPIRATORY:  Clear to auscultation without rales, wheezes or rhonchi. CARDIOVASCULAR:  Regular rate and rhythm without murmur, rub or gallop. ABDOMEN:  Soft, non-tender, with active bowel sounds, and no  hepatosplenomegaly.  No masses. SKIN:  Pale.  No rashes, ulcers or lesions. EXTREMITIES: Arthritic changes.  No edema, no skin discoloration or tenderness.  No palpable cords. LYMPH NODES: No palpable cervical, supraclavicular, axillary or inguinal adenopathy  NEUROLOGICAL: Unremarkable. PSYCH:  Appropriate.   Appointment on 01/18/2016  Component Date Value Ref Range Status  . WBC 01/18/2016 4.1  3.6 - 11.0 K/uL Final  . RBC 01/18/2016 3.38* 3.80 - 5.20 MIL/uL Final  . Hemoglobin 01/18/2016 10.5* 12.0 - 16.0 g/dL Final  . HCT 01/18/2016 29.9* 35.0 - 47.0 % Final  . MCV 01/18/2016 88.3  80.0 - 100.0 fL Final  . MCH 01/18/2016 31.0  26.0 - 34.0 pg Final  . MCHC 01/18/2016 35.1  32.0 - 36.0 g/dL Final  . RDW 01/18/2016 13.1  11.5 - 14.5 % Final  . Platelets 01/18/2016 152  150 - 440 K/uL Final  . Neutrophils Relative % 01/18/2016 62%  % Final  . Neutro Abs 01/18/2016 2.6  1.4 - 6.5 K/uL Final  . Lymphocytes Relative 01/18/2016 24%  % Final  . Lymphs Abs 01/18/2016 1.0  1.0 - 3.6 K/uL Final  . Monocytes Relative 01/18/2016 8%  % Final  . Monocytes Absolute 01/18/2016 0.3  0.2 - 0.9 K/uL Final  . Eosinophils Relative 01/18/2016 5%  % Final  . Eosinophils Absolute 01/18/2016 0.2  0 - 0.7 K/uL Final  . Basophils Relative 01/18/2016 1%  % Final  . Basophils Absolute 01/18/2016 0.0  0 - 0.1 K/uL Final  . Retic Ct Pct 01/18/2016 2.8  0.4 - 3.1 % Final  . RBC. 01/18/2016 3.38* 3.80 - 5.20 MIL/uL Final  . Retic Count, Manual 01/18/2016 94.6  19.0 - 183.0 K/uL Final  . Anit Nuclear Antibody(ANA) 01/19/2016 Negative  Negative Final   Comment: (NOTE) Performed At: [facility] [place], [place] [id number] Lindon Romp MD [id number]   . Rhuematoid fact SerPl-aCnc 01/19/2016 <10.0  0.0 - 13.9 IU/mL Final   Comment: (NOTE) Performed At: [facility] [place], [place] [id number] Lindon Romp MD [id number]   . Total Protein ELP  01/19/2016 6.4  6.0 - 8.5 g/dL Final  . Albumin ELP 01/19/2016 3.7  2.9 - 4.4 g/dL Final  . Alpha-1-Globulin 01/19/2016 0.2  0.0 - 0.4 g/dL Final  .  Alpha-2-Globulin 01/19/2016 0.9  0.4 - 1.0 g/dL Final  . Beta Globulin 01/19/2016 1.3  0.7 - 1.3 g/dL Final  . Gamma Globulin 01/19/2016 0.3* 0.4 - 1.8 g/dL Final  . M-Spike, % 01/19/2016 Not Observed  Not Observed g/dL Final  . SPE Interp. 01/19/2016 Comment   Final   Comment: (NOTE) The SPE pattern reflects hypogammaglobulinemia. Lowered gamma globulin levels may be found with monoclonal gammopathies, B-cell deficiency states, protein losing diseases, or may be transient in children. Serum immunofixation and examination of the urine for Bence Jones protein may be indicated if warranted by the clinical picture. Performed At: [facility] [place], [place] [id number] Lindon Romp MD [id number]   . Comment 01/19/2016 Comment   Final   Comment: (NOTE) Protein electrophoresis scan will follow via computer, mail, or courier delivery.   Marland Kitchen GLOBULIN, TOTAL 01/19/2016 2.7  2.2 - 3.9 g/dL Corrected  . A/G Ratio 01/19/2016 1.4  0.7 - 1.7 Corrected  . Polyspecific AHG test 01/18/2016 NEG   Final  . Ferritin 01/19/2016 38  11 - 307 ng/mL Final  . Iron 01/19/2016 64  28 - 170 ug/dL Final  . TIBC 01/19/2016 462* 250 - 450 ug/dL Final  . Saturation Ratios 01/19/2016 14  10.4 - 31.8 % Final  . UIBC 01/19/2016 398  ug/dL Final  . Vitamin B-12 01/19/2016 4654* 180 - 914 pg/mL Final   Comment: (NOTE) This assay is not validated for testing neonatal or myeloproliferative syndrome specimens for Vitamin B12 levels. Performed at [facility]   . Folate 01/19/2016 39.1  >5.9 ng/mL Final  . Kappa free light chain 01/19/2016 11.7  3.3 - 19.4 mg/L Final  . Lamda free light chains 01/19/2016 10.8  5.7 - 26.3 mg/L Final  . Kappa, lamda light chain ratio 01/19/2016 1.08  0.26 - 1.65 Final   Comment: (NOTE) Performed At: [facility] [address] [place], [place] [id number] Lindon Romp MD [id number]     Assessment:  Kim Ballard is a 81 y.o. female with pancytopenia.  She has had mild thrombocytopenia.  Platelet count has ranged between  125,000 and 152,000 from 11/12/2013 to 12/15/2015 without trend.  She has had anemia since 12/24/2014.  Hematocrit was hovering between 32.3 - 34 until recently. She been on oral iron for the past 2 months. Iron causes constipation.  She previously took over-the-counter iron for 1 - 2 years.  EGD on 08/27/2015 revealed LA grade A reflux esophagitis and erythematous mucosa in the gastric body and antrum.  Gastric biopsy revealed healing mucosal injury and focal intestinal metaplasia.   There was iron pill gastritis.  Colonoscopy revealed 1 medium polyp in the cecum (tubulovillous adenoma).  Diet is fair.  She eats chicken or beef every other day. She eats green leafy vegetables. She denies any cravings.  She denies any problems with infections.  CBC on 12/15/2015 revealed a white count of 3900 with an [place] of 2390. Differential included 61% segs, 23% lymphs, 8% monocytes, 6% eosinophils, and 1% basophils.  Absolute lymphocyte count was 910 (slightly low).  She denies any new medications or herbal products.  Symptomatically, she is fatigued.   Exam reveals no adenopathy or hepatosplenomegaly.  She denies any melena or hematochezia.  Plan: 1.  Review available labs.  She has had a progressive anemia which appears related to her diet.  She has had mild thrombocytopenia dating back at least 2 years possibly due to immune mediated thrombocytopenic purpura (ITP).  She had a slightly low white count in 12/2015 without etiology.  She denies any new medications or herbal products.   We discussed a workup. 2.  Labs today:  CBC with differential, ferritin, iron studies, B12, folate retic, [place], ANA with reflex, rheumatoid factor, SPEP, free light chains. 3.  Continue oral  iron. 4.  RTC in 1-2 weeks for MD assessment and review of labs and discussion regarding direction of therapy.   Lequita Asal, MD  01/18/2016, 3:34 PM

## 2016-01-18 ENCOUNTER — Encounter: Payer: Self-pay | Admitting: Hematology and Oncology

## 2016-01-18 ENCOUNTER — Inpatient Hospital Stay: Payer: Medicare PPO | Attending: Hematology and Oncology | Admitting: Hematology and Oncology

## 2016-01-18 ENCOUNTER — Ambulatory Visit: Payer: Medicare PPO

## 2016-01-18 DIAGNOSIS — N6012 Diffuse cystic mastopathy of left breast: Secondary | ICD-10-CM | POA: Diagnosis not present

## 2016-01-18 DIAGNOSIS — N6011 Diffuse cystic mastopathy of right breast: Secondary | ICD-10-CM

## 2016-01-18 DIAGNOSIS — Z87891 Personal history of nicotine dependence: Secondary | ICD-10-CM | POA: Diagnosis not present

## 2016-01-18 DIAGNOSIS — Z85828 Personal history of other malignant neoplasm of skin: Secondary | ICD-10-CM | POA: Insufficient documentation

## 2016-01-18 DIAGNOSIS — D509 Iron deficiency anemia, unspecified: Secondary | ICD-10-CM | POA: Insufficient documentation

## 2016-01-18 DIAGNOSIS — Z7984 Long term (current) use of oral hypoglycemic drugs: Secondary | ICD-10-CM | POA: Diagnosis not present

## 2016-01-18 DIAGNOSIS — D61818 Other pancytopenia: Secondary | ICD-10-CM

## 2016-01-18 DIAGNOSIS — Z79899 Other long term (current) drug therapy: Secondary | ICD-10-CM

## 2016-01-18 DIAGNOSIS — F329 Major depressive disorder, single episode, unspecified: Secondary | ICD-10-CM | POA: Diagnosis not present

## 2016-01-18 DIAGNOSIS — K219 Gastro-esophageal reflux disease without esophagitis: Secondary | ICD-10-CM | POA: Diagnosis not present

## 2016-01-18 DIAGNOSIS — K210 Gastro-esophageal reflux disease with esophagitis: Secondary | ICD-10-CM

## 2016-01-18 DIAGNOSIS — I10 Essential (primary) hypertension: Secondary | ICD-10-CM | POA: Insufficient documentation

## 2016-01-18 DIAGNOSIS — E119 Type 2 diabetes mellitus without complications: Secondary | ICD-10-CM

## 2016-01-18 DIAGNOSIS — G4700 Insomnia, unspecified: Secondary | ICD-10-CM | POA: Insufficient documentation

## 2016-01-18 DIAGNOSIS — K5900 Constipation, unspecified: Secondary | ICD-10-CM | POA: Insufficient documentation

## 2016-01-18 DIAGNOSIS — R5383 Other fatigue: Secondary | ICD-10-CM | POA: Diagnosis not present

## 2016-01-18 DIAGNOSIS — K589 Irritable bowel syndrome without diarrhea: Secondary | ICD-10-CM | POA: Insufficient documentation

## 2016-01-18 DIAGNOSIS — M129 Arthropathy, unspecified: Secondary | ICD-10-CM

## 2016-01-18 DIAGNOSIS — Z636 Dependent relative needing care at home: Secondary | ICD-10-CM | POA: Insufficient documentation

## 2016-01-18 DIAGNOSIS — D649 Anemia, unspecified: Secondary | ICD-10-CM | POA: Insufficient documentation

## 2016-01-18 DIAGNOSIS — D696 Thrombocytopenia, unspecified: Secondary | ICD-10-CM | POA: Insufficient documentation

## 2016-01-18 DIAGNOSIS — D120 Benign neoplasm of cecum: Secondary | ICD-10-CM | POA: Diagnosis not present

## 2016-01-18 DIAGNOSIS — M797 Fibromyalgia: Secondary | ICD-10-CM | POA: Insufficient documentation

## 2016-01-18 LAB — CBC WITH DIFFERENTIAL/PLATELET
Basophils Absolute: 0 10*3/uL (ref 0–0.1)
Basophils Relative: 1 %
Eosinophils Absolute: 0.2 10*3/uL (ref 0–0.7)
Eosinophils Relative: 5 %
HCT: 29.9 % — ABNORMAL LOW (ref 35.0–47.0)
Hemoglobin: 10.5 g/dL — ABNORMAL LOW (ref 12.0–16.0)
Lymphocytes Relative: 24 %
Lymphs Abs: 1 10*3/uL (ref 1.0–3.6)
MCH: 31 pg (ref 26.0–34.0)
MCHC: 35.1 g/dL (ref 32.0–36.0)
MCV: 88.3 fL (ref 80.0–100.0)
Monocytes Absolute: 0.3 10*3/uL (ref 0.2–0.9)
Monocytes Relative: 8 %
Neutro Abs: 2.6 10*3/uL (ref 1.4–6.5)
Neutrophils Relative %: 62 %
Platelets: 152 10*3/uL (ref 150–440)
RBC: 3.38 MIL/uL — ABNORMAL LOW (ref 3.80–5.20)
RDW: 13.1 % (ref 11.5–14.5)
WBC: 4.1 10*3/uL (ref 3.6–11.0)

## 2016-01-18 LAB — DAT, POLYSPECIFIC AHG (ARMC ONLY): Polyspecific AHG test: NEGATIVE

## 2016-01-18 LAB — RETICULOCYTES
RBC.: 3.38 MIL/uL — ABNORMAL LOW (ref 3.80–5.20)
Retic Count, Absolute: 94.6 10*3/uL (ref 19.0–183.0)
Retic Ct Pct: 2.8 % (ref 0.4–3.1)

## 2016-01-18 NOTE — Progress Notes (Signed)
Patient is here for a new patient appointment. 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

## 2016-01-19 LAB — ANA W/REFLEX IF POSITIVE: Anti Nuclear Antibody(ANA): NEGATIVE

## 2016-01-19 LAB — PROTEIN ELECTROPHORESIS, SERUM
A/G Ratio: 1.4 (ref 0.7–1.7)
Albumin ELP: 3.7 g/dL (ref 2.9–4.4)
Alpha-1-Globulin: 0.2 g/dL (ref 0.0–0.4)
Alpha-2-Globulin: 0.9 g/dL (ref 0.4–1.0)
Beta Globulin: 1.3 g/dL (ref 0.7–1.3)
Gamma Globulin: 0.3 g/dL — ABNORMAL LOW (ref 0.4–1.8)
Globulin, Total: 2.7 g/dL (ref 2.2–3.9)
Total Protein ELP: 6.4 g/dL (ref 6.0–8.5)

## 2016-01-19 LAB — IRON AND TIBC
Iron: 64 ug/dL (ref 28–170)
Saturation Ratios: 14 % (ref 10.4–31.8)
TIBC: 462 ug/dL — ABNORMAL HIGH (ref 250–450)
UIBC: 398 ug/dL

## 2016-01-19 LAB — KAPPA/LAMBDA LIGHT CHAINS
Kappa free light chain: 11.7 mg/L (ref 3.3–19.4)
Kappa, lambda light chain ratio: 1.08 (ref 0.26–1.65)
Lambda free light chains: 10.8 mg/L (ref 5.7–26.3)

## 2016-01-19 LAB — FOLATE: Folate: 39.1 ng/mL

## 2016-01-19 LAB — FERRITIN: Ferritin: 38 ng/mL (ref 11–307)

## 2016-01-19 LAB — RHEUMATOID FACTOR: Rhuematoid fact SerPl-aCnc: <10 IU/mL (ref 0.0–13.9)

## 2016-01-19 LAB — VITAMIN B12: Vitamin B-12: 4654 pg/mL — ABNORMAL HIGH (ref 180–914)

## 2016-02-01 ENCOUNTER — Encounter: Payer: Self-pay | Admitting: Hematology and Oncology

## 2016-02-01 ENCOUNTER — Inpatient Hospital Stay (HOSPITAL_BASED_OUTPATIENT_CLINIC_OR_DEPARTMENT_OTHER): Payer: Medicare PPO | Admitting: Hematology and Oncology

## 2016-02-01 VITALS — BP 170/82 | HR 80 | Temp 97.0°F | Resp 18 | Wt 161.0 lb

## 2016-02-01 DIAGNOSIS — F329 Major depressive disorder, single episode, unspecified: Secondary | ICD-10-CM

## 2016-02-01 DIAGNOSIS — D61818 Other pancytopenia: Secondary | ICD-10-CM | POA: Diagnosis not present

## 2016-02-01 DIAGNOSIS — Z7984 Long term (current) use of oral hypoglycemic drugs: Secondary | ICD-10-CM

## 2016-02-01 DIAGNOSIS — G4700 Insomnia, unspecified: Secondary | ICD-10-CM

## 2016-02-01 DIAGNOSIS — Z87891 Personal history of nicotine dependence: Secondary | ICD-10-CM

## 2016-02-01 DIAGNOSIS — K219 Gastro-esophageal reflux disease without esophagitis: Secondary | ICD-10-CM

## 2016-02-01 DIAGNOSIS — M129 Arthropathy, unspecified: Secondary | ICD-10-CM

## 2016-02-01 DIAGNOSIS — M797 Fibromyalgia: Secondary | ICD-10-CM

## 2016-02-01 DIAGNOSIS — E119 Type 2 diabetes mellitus without complications: Secondary | ICD-10-CM

## 2016-02-01 DIAGNOSIS — N6011 Diffuse cystic mastopathy of right breast: Secondary | ICD-10-CM

## 2016-02-01 DIAGNOSIS — D120 Benign neoplasm of cecum: Secondary | ICD-10-CM

## 2016-02-01 DIAGNOSIS — Z79899 Other long term (current) drug therapy: Secondary | ICD-10-CM

## 2016-02-01 DIAGNOSIS — D509 Iron deficiency anemia, unspecified: Secondary | ICD-10-CM

## 2016-02-01 DIAGNOSIS — K5900 Constipation, unspecified: Secondary | ICD-10-CM | POA: Diagnosis not present

## 2016-02-01 DIAGNOSIS — K210 Gastro-esophageal reflux disease with esophagitis: Secondary | ICD-10-CM

## 2016-02-01 DIAGNOSIS — K589 Irritable bowel syndrome without diarrhea: Secondary | ICD-10-CM

## 2016-02-01 DIAGNOSIS — R5383 Other fatigue: Secondary | ICD-10-CM

## 2016-02-01 DIAGNOSIS — Z85828 Personal history of other malignant neoplasm of skin: Secondary | ICD-10-CM

## 2016-02-01 DIAGNOSIS — I10 Essential (primary) hypertension: Secondary | ICD-10-CM

## 2016-02-01 DIAGNOSIS — N6012 Diffuse cystic mastopathy of left breast: Secondary | ICD-10-CM

## 2016-02-01 DIAGNOSIS — Z636 Dependent relative needing care at home: Secondary | ICD-10-CM

## 2016-02-01 NOTE — Progress Notes (Signed)
Patient is here for follow up,  

## 2016-02-02 ENCOUNTER — Other Ambulatory Visit: Payer: Self-pay | Admitting: *Deleted

## 2016-02-02 DIAGNOSIS — D696 Thrombocytopenia, unspecified: Secondary | ICD-10-CM

## 2016-02-02 DIAGNOSIS — D509 Iron deficiency anemia, unspecified: Secondary | ICD-10-CM

## 2016-02-08 ENCOUNTER — Encounter: Payer: Self-pay | Admitting: Hematology and Oncology

## 2016-02-08 DIAGNOSIS — D509 Iron deficiency anemia, unspecified: Secondary | ICD-10-CM | POA: Insufficient documentation

## 2016-02-08 NOTE — Progress Notes (Signed)
[facility] day:  02/01/2016  Chief Complaint: Kim Ballard is a 81 y.o. female with pancytopenia who is seen for review of work-up and discussion regarding direction of therapy.  HPI:  The patient was last seen in medical oncology clinic on 01/18/2016.  At that time, she was seen for initial consultation. She had mild thrombocytopenia dating back for 2 years. Last CBC prior to evaluation revealed a low white count. She had a progressive normocytic anemia.  She underwent a workup.  CBC revealed a hematocrit 29.9, hemoglobin 10.5, MCV 88.3, platelets 152,000, white count 4100 with an ANC of 2600. Reticulocyte count was 2.8%.  ANA was negative.  Rheumatoid factor was less than 10.  SPEP revealed no monoclonal protein.  Kappa free light chains were 11.7, lambda free light chains 10.8 and a ratio of 1.08 (normal).  Coombs was negative. B12 was 4654. Folate was 39.1. Ferritin was 38 (low) . Iron studies included a saturation of 14% and TIBC of 462 (high) consistent with iron deficiency anemia.  Symptomatically, she feels "about the same".  She feels tired out because of taking care of her husband.   Past Medical History:  Diagnosis Date  . Anemia   . Arthritis   . Cancer ([place])    skin cancers  . Depression   . Diabetes mellitus without complication ([place])   . Fibrocystic disease of both breasts   . Fibromyalgia   . GERD (gastroesophageal reflux disease)   . Headache   . Hypertension   . IBS (irritable bowel syndrome)   . Skin cancer     Past Surgical History:  Procedure Laterality Date  . ABDOMINAL HYSTERECTOMY    . APPENDECTOMY    . CHOLECYSTECTOMY    . COLONOSCOPY    . COLONOSCOPY WITH PROPOFOL N/A 08/27/2015   Procedure: COLONOSCOPY WITH PROPOFOL;  Surgeon: Manya Silvas, MD;  Location: [facility] ENDOSCOPY;  Service: Endoscopy;  Laterality: N/A;  . ESOPHAGOGASTRODUODENOSCOPY    . ESOPHAGOGASTRODUODENOSCOPY (EGD) WITH PROPOFOL N/A  08/27/2015   Procedure: ESOPHAGOGASTRODUODENOSCOPY (EGD) WITH PROPOFOL;  Surgeon: Manya Silvas, MD;  Location: [facility] ENDOSCOPY;  Service: Endoscopy;  Laterality: N/A;  . HERNIA REPAIR      Family History  Problem Relation Age of Onset  . Hypertension Mother   . Ovarian cancer Paternal Grandmother     Social History:  reports that she has quit smoking. She does not have any smokeless tobacco history on file. She reports that she does not drink alcohol or use drugs.  She lives in [place].  Her husband has been bedridden for 3 years. He has Parkinson's and dementia.  She is his primary caregiver.  The patient is alone today.  Allergies:  Allergies  Allergen Reactions  . Biaxin [Clarithromycin] Other (See Comments)    GI UPSET   . Brompheniramine Maleate   . Ceclor [Cefaclor]   . Ciprofloxacin   . Clinoril [Sulindac]   . Codeine Sulfate   . Diovan [Valsartan]   . Doxycycline   . Erythromycin   . Esomeprazole   . Guaifenesin & Derivatives   . Levaquin [Levofloxacin In D5w]   . Lisinopril   . Lodine [Etodolac]   . Macrodantin [Nitrofurantoin Macrocrystal]   . Medrol [Methylprednisolone]   . Mobic [Meloxicam]   . Motrin [Ibuprofen]   . Nexium [Esomeprazole Magnesium]   . Norvasc [Amlodipine Besylate]   . Omnicef [Cefdinir]   . Penicillins   . Prozac [Fluoxetine Hcl] Other (See  Comments)    Makes her feel crazy   . Pseudoephedrine   . Reglan [Metoclopramide]   . Seldane [Terfenadine]   . Toprol Xl [Metoprolol Succinate Er]   . Triamterene   . Tussionex Pennkinetic Er [Hydrocod Polst-Cpm Polst Er]   . Vibramycin [Doxycycline Calcium]     Current Medications: Current Outpatient Prescriptions  Medication Sig Dispense Refill  . albuterol (PROVENTIL HFA;VENTOLIN HFA) 108 (90 Base) MCG/ACT inhaler Inhale into the lungs every 6 (six) hours as needed for wheezing or shortness of breath.    . cephALEXin (KEFLEX) 250 MG capsule Take by mouth.    .  clidinium-chlordiazePOXIDE (LIBRAX) 5-2.5 MG capsule Take 1 capsule by mouth.    Drusilla Kanner EXTRACT PO Take by mouth.    . fluticasone (FLONASE) 50 MCG/ACT nasal spray Place into both nostrils daily.    Marland Kitchen gemfibrozil (LOPID) 600 MG tablet Take 600 mg by mouth 2 (two) times daily before a meal.    . glipiZIDE (GLUCOTROL) 5 MG tablet Take by mouth daily before breakfast.    . hydrochlorothiazide (HYDRODIURIL) 25 MG tablet Take 25 mg by mouth daily.    . iron polysaccharides (NIFEREX) 150 MG capsule Take by mouth.    . Lactobacillus Rhamnosus, GG, ([employer] PROBIOTIC, LACTOBACILLUS, PO) Take by mouth.    . metFORMIN (GLUCOPHAGE) 500 MG tablet Take by mouth 2 (two) times daily with a meal.    . Multiple Vitamins-Minerals (MULTIVITAMIN WITH MINERALS) tablet Take 1 tablet by mouth daily.    . OMEGA-3 FATTY ACIDS PO Take by mouth.    . pantoprazole (PROTONIX) 40 MG tablet Take 40 mg by mouth 2 (two) times daily.    . sucralfate (CARAFATE) 1 g tablet Take 1 g by mouth 4 (four) times daily -  with meals and at bedtime.    Marland Kitchen UNABLE TO FIND TEST EVERY DAY AS DIRECTED    . vitamin B-12 (CYANOCOBALAMIN) 100 MCG tablet Take 100 mcg by mouth daily.    . vitamin E 100 UNIT capsule Take 50 Units by mouth daily.     No current facility-administered medications for this visit.     Review of Systems:  GENERAL:  Tired out taking care of her husband.  No fevers or sweats.  Weight up 6 pounds from 08/27/2015. PERFORMANCE STATUS (ECOG):  1 HEENT:  Cataracts.  No visual changes, runny nose, sore throat, mouth sores or tenderness. Lungs: No shortness of breath or cough.  No hemoptysis. Cardiac:  No chest pain, palpitations, orthopnea, or PND. GI:  Constipation.  No nausea, vomiting, diarrhea, melena or hematochezia. GU:  No urgency, frequency, dysuria, or hematuria. Musculoskeletal:  Fibromyalgia.  No back pain.  No joint pain.  No muscle tenderness. Extremities:  No pain or swelling. Skin:  No rashes or skin  changes. Neuro:  No headache, numbness or weakness, balance or coordination issues. Endocrine:  Diabetes.  No thyroid issues, hot flashes or night sweats. Psych:  Lots of stress.  No mood changes, depression or anxiety. Pain:  No focal pain. Review of systems:  All other systems reviewed and found to be negative.  Physical Exam: Blood pressure (!) 170/82, pulse 80, temperature 97 F (36.1 C), temperature source Tympanic, resp. rate 18, weight 161 lb 0.7 oz (73.1 kg). GENERAL:  Well developed, well nourished, sitting comfortably in the exam room in no acute distress. MENTAL STATUS:  Alert and oriented to person, place and time. HEAD:  Short dark hair.  Normocephalic, atraumatic, face symmetric, no Cushingoid features.  EYES:  Green eyes.  No conjunctivitis or scleral icterus. NEUROLOGICAL: Unremarkable. PSYCH:  Appropriate.   No visits with results within 3 Day(s) from this visit.  Latest known visit with results is:  Appointment on 01/18/2016  Component Date Value Ref Range Status  . WBC 01/18/2016 4.1  3.6 - 11.0 K/uL Final  . RBC 01/18/2016 3.38* 3.80 - 5.20 MIL/uL Final  . Hemoglobin 01/18/2016 10.5* 12.0 - 16.0 g/dL Final  . HCT 01/18/2016 29.9* 35.0 - 47.0 % Final  . MCV 01/18/2016 88.3  80.0 - 100.0 fL Final  . MCH 01/18/2016 31.0  26.0 - 34.0 pg Final  . MCHC 01/18/2016 35.1  32.0 - 36.0 g/dL Final  . RDW 01/18/2016 13.1  11.5 - 14.5 % Final  . Platelets 01/18/2016 152  150 - 440 K/uL Final  . Neutrophils Relative % 01/18/2016 62%  % Final  . Neutro Abs 01/18/2016 2.6  1.4 - 6.5 K/uL Final  . Lymphocytes Relative 01/18/2016 24%  % Final  . Lymphs Abs 01/18/2016 1.0  1.0 - 3.6 K/uL Final  . Monocytes Relative 01/18/2016 8%  % Final  . Monocytes Absolute 01/18/2016 0.3  0.2 - 0.9 K/uL Final  . Eosinophils Relative 01/18/2016 5%  % Final  . Eosinophils Absolute 01/18/2016 0.2  0 - 0.7 K/uL Final  . Basophils Relative 01/18/2016 1%  % Final  . Basophils Absolute 01/18/2016  0.0  0 - 0.1 K/uL Final  . Retic Ct Pct 01/18/2016 2.8  0.4 - 3.1 % Final  . RBC. 01/18/2016 3.38* 3.80 - 5.20 MIL/uL Final  . Retic Count, Manual 01/18/2016 94.6  19.0 - 183.0 K/uL Final  . Anit Nuclear Antibody(ANA) 01/19/2016 Negative  Negative Final   Comment: (NOTE) Performed At: [facility] [place], [place] [id number] Lindon Romp MD [id number]   . Rhuematoid fact SerPl-aCnc 01/19/2016 <10.0  0.0 - 13.9 IU/mL Final   Comment: (NOTE) Performed At: [facility] [place], [place] [id number] Lindon Romp MD BZ:[id number]   . Total Protein ELP 01/19/2016 6.4  6.0 - 8.5 g/dL Final  . Albumin ELP 01/19/2016 3.7  2.9 - 4.4 g/dL Final  . Alpha-1-Globulin 01/19/2016 0.2  0.0 - 0.4 g/dL Final  . Alpha-2-Globulin 01/19/2016 0.9  0.4 - 1.0 g/dL Final  . Beta Globulin 01/19/2016 1.3  0.7 - 1.3 g/dL Final  . Gamma Globulin 01/19/2016 0.3* 0.4 - 1.8 g/dL Final  . M-Spike, % 01/19/2016 Not Observed  Not Observed g/dL Final  . SPE Interp. 01/19/2016 Comment   Final   Comment: (NOTE) The SPE pattern reflects hypogammaglobulinemia. Lowered gamma globulin levels may be found with monoclonal gammopathies, B-cell deficiency states, protein losing diseases, or may be transient in children. Serum immunofixation and examination of the urine for Bence Jones protein may be indicated if warranted by the clinical picture. Performed At: [facility] [place], [place] [id number] Lindon Romp MD [id number]   . Comment 01/19/2016 Comment   Final   Comment: (NOTE) Protein electrophoresis scan will follow via computer, mail, or courier delivery.   Marland Kitchen GLOBULIN, TOTAL 01/19/2016 2.7  2.2 - 3.9 g/dL Corrected  . A/G Ratio 01/19/2016 1.4  0.7 - 1.7 Corrected  . Polyspecific AHG test 01/18/2016 NEG   Final  . Ferritin 01/19/2016 38  11 - 307 ng/mL Final  . Iron 01/19/2016 64  28 - 170 ug/dL Final  . TIBC 01/19/2016 462* 250  - 450 ug/dL Final  .  Saturation Ratios 01/19/2016 14  10.4 - 31.8 % Final  . UIBC 01/19/2016 398  ug/dL Final  . Vitamin B-12 01/19/2016 4654* 180 - 914 pg/mL Final   Comment: (NOTE) This assay is not validated for testing neonatal or myeloproliferative syndrome specimens for Vitamin B12 levels. Performed at [facility]   . Folate 01/19/2016 39.1  >5.9 ng/mL Final  . Kappa free light chain 01/19/2016 11.7  3.3 - 19.4 mg/L Final  . Lamda free light chains 01/19/2016 10.8  5.7 - 26.3 mg/L Final  . Kappa, lamda light chain ratio 01/19/2016 1.08  0.26 - 1.65 Final   Comment: (NOTE) Performed At: [facility] [address] [place], [place] [id number] Lindon Romp MD PI:[id number]     Assessment:  Kim Ballard is a 81 y.o. female with iron deficiency anemia.  She has had anemia since 12/24/2014.  Hematocrit previously ranged between 32.3 - 34. She been on oral iron for the past 2 months. Iron causes constipation.    EGD on 08/27/2015 revealed LA grade A reflux esophagitis and erythematous mucosa in the gastric body and antrum.  Gastric biopsy revealed healing mucosal injury and focal intestinal metaplasia.   There was iron pill gastritis.  Colonoscopy on 08/27/2015 revealed 1 medium polyp in the cecum (tubulovillous adenoma).  Diet is fair.  She eats chicken or beef every other day. She eats green leafy vegetables. She denies any cravings.  Workup on 01/19/2016 revealed a hematocrit 29.9, hemoglobin 10.5, MCV 88.3, platelets 152,000, white count 4100 with an ANC of 2600. Reticulocyte count was 2.8%.  The following studies were normal:  ANA, rheumatoid factor, SPEP, free light chain ratio, Coombs, B12, folate.  Ferritin was 38 (low) . Iron studies included a saturation of 14% and TIBC of 462 (high) consistent with iron deficiency anemia.  She has had mild thrombocytopenia possibly secondary to ITP.  Platelet count has ranged between  125,000 and 152,000 from 11/12/2013 to  present without trend.  Abdominal and pelvic CT scan on 10/28/2015 revealed no hepatosplenomegaly.  Symptomatically, she is fatigued.   Exam reveals no adenopathy or hepatosplenomegaly.  She denies any melena or hematochezia.  Plan: 1.  Review work-up.  Platelet count and WBC are normal.  She has isolated anemia.  Work-up reveals iron deficiency.  The etiology is felt secondary to her diet.  Currently, there is no evidence of bleeding. She had a EGD and colonoscopy earlier this year.  Discuss continuation of oral iron. If she is unable to replete her iron stores or she has problems tolerating oral iron, we discussed IV iron. 2.  Continue oral iron.  Ferritin goal 100. 3.  Preauth Venofer 4.  RTC in 6 weeks for labs (CBC with diff, ferritin, ESR, retic) 5.  RTC in 12 weeks for MD assessment, labs (CBC with diff, ferritin- day before) and +/- Venofer   Lequita Asal, MD  02/01/2016

## 2016-02-19 ENCOUNTER — Encounter (INDEPENDENT_AMBULATORY_CARE_PROVIDER_SITE_OTHER): Payer: Self-pay

## 2016-02-19 DIAGNOSIS — E785 Hyperlipidemia, unspecified: Secondary | ICD-10-CM | POA: Insufficient documentation

## 2016-02-19 DIAGNOSIS — E139 Other specified diabetes mellitus without complications: Secondary | ICD-10-CM

## 2016-02-19 DIAGNOSIS — E119 Type 2 diabetes mellitus without complications: Secondary | ICD-10-CM | POA: Insufficient documentation

## 2016-02-19 DIAGNOSIS — K551 Chronic vascular disorders of intestine: Secondary | ICD-10-CM | POA: Insufficient documentation

## 2016-03-07 ENCOUNTER — Other Ambulatory Visit: Payer: Self-pay

## 2016-03-07 NOTE — Addendum Note (Signed)
Addended by: Tobi Bastos on: 03/07/2016 07:07 PM   Modules accepted: Miquel Dunn

## 2016-03-07 NOTE — Telephone Encounter (Signed)
This encounter was created in error - please disregard.

## 2016-03-14 ENCOUNTER — Other Ambulatory Visit: Payer: Self-pay

## 2016-03-14 ENCOUNTER — Inpatient Hospital Stay: Payer: Medicare PPO | Attending: Hematology and Oncology

## 2016-03-14 DIAGNOSIS — D509 Iron deficiency anemia, unspecified: Secondary | ICD-10-CM

## 2016-03-14 DIAGNOSIS — Z79899 Other long term (current) drug therapy: Secondary | ICD-10-CM | POA: Diagnosis not present

## 2016-03-14 DIAGNOSIS — D696 Thrombocytopenia, unspecified: Secondary | ICD-10-CM

## 2016-03-14 LAB — CBC WITH DIFFERENTIAL/PLATELET
Basophils Absolute: 0 10*3/uL (ref 0–0.1)
Basophils Relative: 1 %
Eosinophils Absolute: 0.2 10*3/uL (ref 0–0.7)
Eosinophils Relative: 4 %
HCT: 31.8 % — ABNORMAL LOW (ref 35.0–47.0)
Hemoglobin: 11 g/dL — ABNORMAL LOW (ref 12.0–16.0)
Lymphocytes Relative: 25 %
Lymphs Abs: 1 10*3/uL (ref 1.0–3.6)
MCH: 29.9 pg (ref 26.0–34.0)
MCHC: 34.6 g/dL (ref 32.0–36.0)
MCV: 86.3 fL (ref 80.0–100.0)
Monocytes Absolute: 0.3 10*3/uL (ref 0.2–0.9)
Monocytes Relative: 8 %
Neutro Abs: 2.6 10*3/uL (ref 1.4–6.5)
Neutrophils Relative %: 62 %
Platelets: 133 10*3/uL — ABNORMAL LOW (ref 150–440)
RBC: 3.69 MIL/uL — ABNORMAL LOW (ref 3.80–5.20)
RDW: 14.5 % (ref 11.5–14.5)
WBC: 4.2 10*3/uL (ref 3.6–11.0)

## 2016-03-14 LAB — RETICULOCYTES
RBC.: 3.69 MIL/uL — ABNORMAL LOW (ref 3.80–5.20)
Retic Count, Absolute: 114.4 10*3/uL (ref 19.0–183.0)
Retic Ct Pct: 3.1 % (ref 0.4–3.1)

## 2016-03-14 LAB — FERRITIN: Ferritin: 27 ng/mL (ref 11–307)

## 2016-03-14 LAB — SEDIMENTATION RATE: Sed Rate: 56 mm/hr — ABNORMAL HIGH (ref 0–30)

## 2016-03-15 ENCOUNTER — Telehealth: Payer: Self-pay | Admitting: *Deleted

## 2016-03-15 NOTE — Telephone Encounter (Signed)
-----   Message from Lequita Asal, MD sent at 03/15/2016  3:25 AM EDT ----- Regarding: Please call patient  Hematocrit improved slightly.  Ferritin still low.  We can start IV iron or wait till f/u appt in 6 weeks.  M

## 2016-03-15 NOTE — Telephone Encounter (Signed)
I called pt and let her know about the ferritin levels low and hct improved and pt agreeable to venofer instead of waiting 6 weeks.  I told her that the scheduler would call her with appt..  It will be 2 doses and 1 week apart and pt states her husband is   In hospital and she needs appt next week to start venofer but he has appt for f/u and so I said would next Friday be ok and she was agreeable to the appt starting10/13. And pt called about the 2 appt for venofer.

## 2016-03-16 ENCOUNTER — Other Ambulatory Visit (INDEPENDENT_AMBULATORY_CARE_PROVIDER_SITE_OTHER): Payer: Self-pay | Admitting: Vascular Surgery

## 2016-03-16 DIAGNOSIS — K551 Chronic vascular disorders of intestine: Secondary | ICD-10-CM

## 2016-03-21 ENCOUNTER — Encounter (INDEPENDENT_AMBULATORY_CARE_PROVIDER_SITE_OTHER): Payer: Self-pay

## 2016-03-21 ENCOUNTER — Ambulatory Visit (INDEPENDENT_AMBULATORY_CARE_PROVIDER_SITE_OTHER): Payer: Self-pay | Admitting: Vascular Surgery

## 2016-03-21 ENCOUNTER — Encounter (INDEPENDENT_AMBULATORY_CARE_PROVIDER_SITE_OTHER): Payer: Medicare PPO

## 2016-03-25 ENCOUNTER — Inpatient Hospital Stay: Payer: Medicare PPO

## 2016-03-25 DIAGNOSIS — D509 Iron deficiency anemia, unspecified: Secondary | ICD-10-CM | POA: Diagnosis not present

## 2016-03-25 DIAGNOSIS — D500 Iron deficiency anemia secondary to blood loss (chronic): Secondary | ICD-10-CM

## 2016-03-25 MED ORDER — IRON SUCROSE 20 MG/ML IV SOLN
200.0000 mg | Freq: Once | INTRAVENOUS | Status: AC
  Administered 2016-03-25: 200 mg via INTRAVENOUS
  Filled 2016-03-25 (×2): qty 10

## 2016-03-25 MED ORDER — SODIUM CHLORIDE 0.9 % IV SOLN
Freq: Once | INTRAVENOUS | Status: AC
  Administered 2016-03-25: 14:00:00 via INTRAVENOUS
  Filled 2016-03-25: qty 1000

## 2016-03-25 MED ORDER — SODIUM CHLORIDE 0.9 % IV SOLN: 200.0000 mg | Freq: Once | INTRAVENOUS | Status: DC

## 2016-04-01 ENCOUNTER — Inpatient Hospital Stay: Payer: Medicare PPO

## 2016-04-01 VITALS — BP 164/70 | HR 80 | Temp 97.0°F | Resp 18

## 2016-04-01 DIAGNOSIS — D500 Iron deficiency anemia secondary to blood loss (chronic): Secondary | ICD-10-CM

## 2016-04-01 DIAGNOSIS — D509 Iron deficiency anemia, unspecified: Secondary | ICD-10-CM | POA: Diagnosis not present

## 2016-04-01 MED ORDER — SODIUM CHLORIDE 0.9 % IV SOLN: 200.0000 mg | Freq: Once | INTRAVENOUS | Status: DC

## 2016-04-01 MED ORDER — SODIUM CHLORIDE 0.9 % IV SOLN
Freq: Once | INTRAVENOUS | Status: AC
  Administered 2016-04-01: 14:00:00 via INTRAVENOUS
  Filled 2016-04-01: qty 1000

## 2016-04-01 MED ORDER — IRON SUCROSE 20 MG/ML IV SOLN
200.0000 mg | Freq: Once | INTRAVENOUS | Status: AC
  Administered 2016-04-01: 200 mg via INTRAVENOUS
  Filled 2016-04-01: qty 10

## 2016-04-01 MED ORDER — IRON SUCROSE 20 MG/ML IV SOLN
200.0000 mg | Freq: Once | INTRAVENOUS | Status: DC
  Filled 2016-04-01: qty 10

## 2016-04-22 ENCOUNTER — Inpatient Hospital Stay: Payer: Medicare PPO | Attending: Hematology and Oncology

## 2016-04-22 DIAGNOSIS — K219 Gastro-esophageal reflux disease without esophagitis: Secondary | ICD-10-CM | POA: Insufficient documentation

## 2016-04-22 DIAGNOSIS — F329 Major depressive disorder, single episode, unspecified: Secondary | ICD-10-CM | POA: Insufficient documentation

## 2016-04-22 DIAGNOSIS — R5383 Other fatigue: Secondary | ICD-10-CM | POA: Insufficient documentation

## 2016-04-22 DIAGNOSIS — Z79899 Other long term (current) drug therapy: Secondary | ICD-10-CM | POA: Diagnosis not present

## 2016-04-22 DIAGNOSIS — E119 Type 2 diabetes mellitus without complications: Secondary | ICD-10-CM | POA: Insufficient documentation

## 2016-04-22 DIAGNOSIS — N6011 Diffuse cystic mastopathy of right breast: Secondary | ICD-10-CM | POA: Diagnosis not present

## 2016-04-22 DIAGNOSIS — Z86010 Personal history of colonic polyps: Secondary | ICD-10-CM | POA: Insufficient documentation

## 2016-04-22 DIAGNOSIS — Z23 Encounter for immunization: Secondary | ICD-10-CM | POA: Insufficient documentation

## 2016-04-22 DIAGNOSIS — N6012 Diffuse cystic mastopathy of left breast: Secondary | ICD-10-CM | POA: Insufficient documentation

## 2016-04-22 DIAGNOSIS — Z87891 Personal history of nicotine dependence: Secondary | ICD-10-CM | POA: Insufficient documentation

## 2016-04-22 DIAGNOSIS — Z9049 Acquired absence of other specified parts of digestive tract: Secondary | ICD-10-CM | POA: Diagnosis not present

## 2016-04-22 DIAGNOSIS — I10 Essential (primary) hypertension: Secondary | ICD-10-CM | POA: Insufficient documentation

## 2016-04-22 DIAGNOSIS — M129 Arthropathy, unspecified: Secondary | ICD-10-CM | POA: Diagnosis not present

## 2016-04-22 DIAGNOSIS — Z8041 Family history of malignant neoplasm of ovary: Secondary | ICD-10-CM | POA: Insufficient documentation

## 2016-04-22 DIAGNOSIS — K589 Irritable bowel syndrome without diarrhea: Secondary | ICD-10-CM | POA: Insufficient documentation

## 2016-04-22 DIAGNOSIS — D509 Iron deficiency anemia, unspecified: Secondary | ICD-10-CM | POA: Diagnosis not present

## 2016-04-22 DIAGNOSIS — Z85828 Personal history of other malignant neoplasm of skin: Secondary | ICD-10-CM | POA: Insufficient documentation

## 2016-04-22 DIAGNOSIS — D61818 Other pancytopenia: Secondary | ICD-10-CM | POA: Insufficient documentation

## 2016-04-22 DIAGNOSIS — Z8719 Personal history of other diseases of the digestive system: Secondary | ICD-10-CM | POA: Diagnosis not present

## 2016-04-22 DIAGNOSIS — Z7984 Long term (current) use of oral hypoglycemic drugs: Secondary | ICD-10-CM | POA: Insufficient documentation

## 2016-04-22 DIAGNOSIS — D696 Thrombocytopenia, unspecified: Secondary | ICD-10-CM

## 2016-04-22 DIAGNOSIS — Z90710 Acquired absence of both cervix and uterus: Secondary | ICD-10-CM | POA: Insufficient documentation

## 2016-04-22 DIAGNOSIS — M797 Fibromyalgia: Secondary | ICD-10-CM | POA: Diagnosis not present

## 2016-04-22 DIAGNOSIS — R531 Weakness: Secondary | ICD-10-CM | POA: Insufficient documentation

## 2016-04-22 LAB — CBC WITH DIFFERENTIAL/PLATELET
Basophils Absolute: 0 10*3/uL (ref 0–0.1)
Basophils Relative: 1 %
Eosinophils Absolute: 0.1 10*3/uL (ref 0–0.7)
Eosinophils Relative: 3 %
HCT: 29.6 % — ABNORMAL LOW (ref 35.0–47.0)
Hemoglobin: 10.2 g/dL — ABNORMAL LOW (ref 12.0–16.0)
Lymphocytes Relative: 28 %
Lymphs Abs: 1 10*3/uL (ref 1.0–3.6)
MCH: 30.8 pg (ref 26.0–34.0)
MCHC: 34.5 g/dL (ref 32.0–36.0)
MCV: 89.1 fL (ref 80.0–100.0)
Monocytes Absolute: 0.3 10*3/uL (ref 0.2–0.9)
Monocytes Relative: 9 %
Neutro Abs: 2 10*3/uL (ref 1.4–6.5)
Neutrophils Relative %: 59 %
Platelets: 105 10*3/uL — ABNORMAL LOW (ref 150–440)
RBC: 3.32 MIL/uL — ABNORMAL LOW (ref 3.80–5.20)
RDW: 15.3 % — ABNORMAL HIGH (ref 11.5–14.5)
WBC: 3.4 10*3/uL — ABNORMAL LOW (ref 3.6–11.0)

## 2016-04-22 LAB — FERRITIN: Ferritin: 65 ng/mL (ref 11–307)

## 2016-04-25 ENCOUNTER — Ambulatory Visit: Payer: Medicare PPO | Admitting: Hematology and Oncology

## 2016-04-25 ENCOUNTER — Ambulatory Visit: Payer: Medicare PPO

## 2016-04-26 ENCOUNTER — Inpatient Hospital Stay: Payer: Medicare PPO

## 2016-04-26 ENCOUNTER — Encounter: Payer: Self-pay | Admitting: Hematology and Oncology

## 2016-04-26 ENCOUNTER — Inpatient Hospital Stay (HOSPITAL_BASED_OUTPATIENT_CLINIC_OR_DEPARTMENT_OTHER): Payer: Medicare PPO | Admitting: Hematology and Oncology

## 2016-04-26 ENCOUNTER — Other Ambulatory Visit: Payer: Self-pay

## 2016-04-26 VITALS — BP 146/66 | HR 72 | Temp 94.9°F | Resp 18 | Wt 157.8 lb

## 2016-04-26 DIAGNOSIS — Z0000 Encounter for general adult medical examination without abnormal findings: Secondary | ICD-10-CM

## 2016-04-26 DIAGNOSIS — D72819 Decreased white blood cell count, unspecified: Secondary | ICD-10-CM | POA: Insufficient documentation

## 2016-04-26 DIAGNOSIS — K219 Gastro-esophageal reflux disease without esophagitis: Secondary | ICD-10-CM

## 2016-04-26 DIAGNOSIS — D61818 Other pancytopenia: Secondary | ICD-10-CM | POA: Diagnosis not present

## 2016-04-26 DIAGNOSIS — D500 Iron deficiency anemia secondary to blood loss (chronic): Secondary | ICD-10-CM

## 2016-04-26 DIAGNOSIS — D509 Iron deficiency anemia, unspecified: Secondary | ICD-10-CM | POA: Diagnosis not present

## 2016-04-26 DIAGNOSIS — Z8719 Personal history of other diseases of the digestive system: Secondary | ICD-10-CM

## 2016-04-26 DIAGNOSIS — N6012 Diffuse cystic mastopathy of left breast: Secondary | ICD-10-CM

## 2016-04-26 DIAGNOSIS — Z23 Encounter for immunization: Secondary | ICD-10-CM

## 2016-04-26 DIAGNOSIS — R531 Weakness: Secondary | ICD-10-CM

## 2016-04-26 DIAGNOSIS — M129 Arthropathy, unspecified: Secondary | ICD-10-CM

## 2016-04-26 DIAGNOSIS — Z79899 Other long term (current) drug therapy: Secondary | ICD-10-CM

## 2016-04-26 DIAGNOSIS — F329 Major depressive disorder, single episode, unspecified: Secondary | ICD-10-CM

## 2016-04-26 DIAGNOSIS — Z86010 Personal history of colonic polyps: Secondary | ICD-10-CM

## 2016-04-26 DIAGNOSIS — K589 Irritable bowel syndrome without diarrhea: Secondary | ICD-10-CM

## 2016-04-26 DIAGNOSIS — Z9049 Acquired absence of other specified parts of digestive tract: Secondary | ICD-10-CM

## 2016-04-26 DIAGNOSIS — Z87891 Personal history of nicotine dependence: Secondary | ICD-10-CM

## 2016-04-26 DIAGNOSIS — I10 Essential (primary) hypertension: Secondary | ICD-10-CM

## 2016-04-26 DIAGNOSIS — Z90710 Acquired absence of both cervix and uterus: Secondary | ICD-10-CM

## 2016-04-26 DIAGNOSIS — E119 Type 2 diabetes mellitus without complications: Secondary | ICD-10-CM

## 2016-04-26 DIAGNOSIS — Z85828 Personal history of other malignant neoplasm of skin: Secondary | ICD-10-CM

## 2016-04-26 DIAGNOSIS — N6011 Diffuse cystic mastopathy of right breast: Secondary | ICD-10-CM

## 2016-04-26 DIAGNOSIS — M797 Fibromyalgia: Secondary | ICD-10-CM

## 2016-04-26 DIAGNOSIS — Z8041 Family history of malignant neoplasm of ovary: Secondary | ICD-10-CM

## 2016-04-26 DIAGNOSIS — R5383 Other fatigue: Secondary | ICD-10-CM | POA: Diagnosis not present

## 2016-04-26 DIAGNOSIS — Z7984 Long term (current) use of oral hypoglycemic drugs: Secondary | ICD-10-CM

## 2016-04-26 MED ORDER — INFLUENZA VAC SPLIT QUAD 0.5 ML IM SUSY
0.5000 mL | PREFILLED_SYRINGE | Freq: Once | INTRAMUSCULAR | Status: AC
  Administered 2016-04-26: 0.5 mL via INTRAMUSCULAR
  Filled 2016-04-26: qty 0.5

## 2016-04-26 NOTE — Progress Notes (Signed)
Patient having colonoscopy on 27th.  All vitamins on hold until after procedure. Patient states she has pain in her right abdomen.  She has adhesions from previous surgeries. Patient's husband is bedridden and under home hospice care, therefore, patient does not get much sleep at a time.

## 2016-04-26 NOTE — Progress Notes (Signed)
[facility] day:  04/26/2016   Chief Complaint: Kim Ballard is a 81 y.o. female with pancytopenia who is seen for 3 month assessment.  HPI:  The patient was last seen in medical oncology clinic on 02/01/2016.  At that time, work-up was reviewed.  She had isolated anemia.  Ferritin was 38 (low).  Iron studies included a saturation of 14% and TIBC of 462 (high) consistent with iron deficiency anemia.  Symptomatically she was fatigued.  The etiology of her anemia was felt secondary to her diet.  There was no evidence of bleeding. She had a EGD and colonoscopy in 2017.  We discussed continuation of oral iron.  If she was unable to replete her iron stores or she had problems tolerating oral iron, we discussed IV iron.  Labs on 03/14/2016 revealed a hematocrit of 31.8, hemoglobin 11.0, MCV 86.3, platelets 133,000, WBC 4200 with an ANC of 2600.  Ferritin was 27.  Retic was 3.1%.  ESR was 56.  She received Venofer 200 mg IV x 2 (03/25/2016 and 04/01/2016).  CBC on 04/22/2016 revealed a hematocrit of 29.6, hemoglobin 10.2, MCV 89.1, platelets 105,000, WBC 3400 with an ANC of 2000.  Differential was unremarkable.  Ferritin was 65.  She is scheduled to have a colonoscopy on 05/02/2016.  Symptomatcally, she feels tired/exhausted.  She notes a prior history of EBV.  She saw Dr. Ginette Pitman yesterday.  She notes being sick after "IV iron push".  She has a "lot of problem with eating".   Past Medical History:  Diagnosis Date  . Anemia   . Arthritis   . Cancer ([place])    skin cancers  . Depression   . Diabetes mellitus without complication ([place])   . Fibrocystic disease of both breasts   . Fibromyalgia   . GERD (gastroesophageal reflux disease)   . Headache   . Hypertension   . IBS (irritable bowel syndrome)   . Skin cancer     Past Surgical History:  Procedure Laterality Date  . ABDOMINAL HYSTERECTOMY    . APPENDECTOMY    . CHOLECYSTECTOMY    .  COLONOSCOPY    . COLONOSCOPY WITH PROPOFOL N/A 08/27/2015   Procedure: COLONOSCOPY WITH PROPOFOL;  Surgeon: Manya Silvas, MD;  Location: [facility] ENDOSCOPY;  Service: Endoscopy;  Laterality: N/A;  . ESOPHAGOGASTRODUODENOSCOPY    . ESOPHAGOGASTRODUODENOSCOPY (EGD) WITH PROPOFOL N/A 08/27/2015   Procedure: ESOPHAGOGASTRODUODENOSCOPY (EGD) WITH PROPOFOL;  Surgeon: Manya Silvas, MD;  Location: [facility] ENDOSCOPY;  Service: Endoscopy;  Laterality: N/A;  . HERNIA REPAIR      Family History  Problem Relation Age of Onset  . Hypertension Mother   . Ovarian cancer Paternal Grandmother   . COPD Father   . Hyperlipidemia Father   . Kidney disease Father   . Diabetes Maternal Grandmother     Social History:  reports that she has quit smoking. She has never used smokeless tobacco. She reports that she does not drink alcohol or use drugs.  She lives in [place].  Her husband has been bedridden for 3 years. He has Parkinson's and dementia.  She is his primary caregiver.  The patient is alone today.  Allergies:  Allergies  Allergen Reactions  . Biaxin [Clarithromycin] Other (See Comments)    GI UPSET   . Brompheniramine Maleate   . Ceclor [Cefaclor]   . Ciprofloxacin   . Clinoril [Sulindac]   . Codeine Sulfate   . Diovan [Valsartan]   .  Doxycycline   . Erythromycin   . Esomeprazole   . Guaifenesin & Derivatives   . Levaquin [Levofloxacin In D5w]   . Lisinopril   . Lodine [Etodolac]   . Macrodantin [Nitrofurantoin Macrocrystal]   . Medrol [Methylprednisolone]   . Mobic [Meloxicam]   . Motrin [Ibuprofen]   . Nexium [Esomeprazole Magnesium]   . Norvasc [Amlodipine Besylate]   . Omnicef [Cefdinir]   . Penicillins   . Prozac [Fluoxetine Hcl] Other (See Comments)    Makes her feel crazy   . Pseudoephedrine   . Reglan [Metoclopramide]   . Seldane [Terfenadine]   . Toprol Xl [Metoprolol Succinate Er]   . Triamterene   . Tussionex Pennkinetic Er [Hydrocod Polst-Cpm Polst Er]   .  Vibramycin [Doxycycline Calcium]     Current Medications: Current Outpatient Prescriptions  Medication Sig Dispense Refill  . albuterol (PROVENTIL HFA;VENTOLIN HFA) 108 (90 Base) MCG/ACT inhaler Inhale into the lungs every 6 (six) hours as needed for wheezing or shortness of breath.    . clidinium-chlordiazePOXIDE (LIBRAX) 5-2.5 MG capsule Take 1 capsule by mouth.    Drusilla Kanner EXTRACT PO Take by mouth.    . fluticasone (FLONASE) 50 MCG/ACT nasal spray Place into both nostrils daily.    Marland Kitchen gemfibrozil (LOPID) 600 MG tablet Take 600 mg by mouth 2 (two) times daily before a meal.    . glipiZIDE (GLUCOTROL) 5 MG tablet Take by mouth daily before breakfast.    . hydrochlorothiazide (HYDRODIURIL) 25 MG tablet Take 25 mg by mouth daily.    . Lactobacillus Rhamnosus, GG, (CVS PROBIOTIC, LACTOBACILLUS, PO) Take by mouth.    . metFORMIN (GLUCOPHAGE) 500 MG tablet Take by mouth 2 (two) times daily with a meal.    . pantoprazole (PROTONIX) 40 MG tablet Take 40 mg by mouth 2 (two) times daily.    . cephALEXin (KEFLEX) 250 MG capsule Take by mouth.    . iron polysaccharides (NIFEREX) 150 MG capsule Take by mouth.    . Multiple Vitamins-Minerals (MULTIVITAMIN WITH MINERALS) tablet Take 1 tablet by mouth daily.    . OMEGA-3 FATTY ACIDS PO Take by mouth.    . sucralfate (CARAFATE) 1 g tablet Take 1 g by mouth 4 (four) times daily -  with meals and at bedtime.    Marland Kitchen UNABLE TO FIND TEST EVERY DAY AS DIRECTED    . vitamin B-12 (CYANOCOBALAMIN) 100 MCG tablet Take 100 mcg by mouth daily.    . vitamin E 100 UNIT capsule Take 50 Units by mouth daily.     No current facility-administered medications for this visit.    Facility-Administered Medications Ordered in Other Visits  Medication Dose Route Frequency Provider Last Rate Last Dose  . iron sucrose (VENOFER) injection 200 mg  200 mg Intravenous Once Lequita Asal, MD        Review of Systems:  GENERAL:  Tired out taking care of her husband.  No  fevers or sweats.  Weight up 6 pounds from 08/27/2015. PERFORMANCE STATUS (ECOG):  1 HEENT:  Cataracts.  No visual changes, runny nose, sore throat, mouth sores or tenderness. Lungs: No shortness of breath or cough.  No hemoptysis. Cardiac:  No chest pain, palpitations, orthopnea, or PND. GI:  Constipation.  Right sided abdominal pain.  No nausea, vomiting, diarrhea, melena or hematochezia. GU:  No urgency, frequency, dysuria, or hematuria. Musculoskeletal:  Fibromyalgia.  No back pain.  No joint pain.  No muscle tenderness. Extremities:  No pain or swelling. Skin:  No rashes or skin changes. Neuro:  No headache, numbness or weakness, balance or coordination issues. Endocrine:  Diabetes.  No thyroid issues, hot flashes or night sweats. Psych:  Lots of stress.  Poor sleep.  No mood changes, depression or anxiety. Pain:  No focal pain. Review of systems:  All other systems reviewed and found to be negative.  Physical Exam: Blood pressure (!) 146/66, pulse 72, temperature (!) 94.9 F (34.9 C), temperature source Tympanic, resp. rate 18, weight 157 lb 13.6 oz (71.6 kg). GENERAL:  Well developed, well nourished, sitting comfortably in the exam room in no acute distress. MENTAL STATUS:  Alert and oriented to person, place and time. HEAD:  Short dark hair.  Normocephalic, atraumatic, face symmetric, no Cushingoid features. EYES:  Green eyes.  Pupils equal round and reactive to light and accomodation.  No conjunctivitis or scleral icterus. ENT:  Oropharynx clear without lesion.  Dentures.  Tongue normal. Mucous membranes moist.  RESPIRATORY:  Clear to auscultation without rales, wheezes or rhonchi. CARDIOVASCULAR:  Regular rate and rhythm without murmur, rub or gallop. ABDOMEN:  Soft, non-tender, with active bowel sounds, and no hepatosplenomegaly.  No masses. SKIN:  Pale.  No rashes, ulcers or lesions. EXTREMITIES: Arthritic changes.  No edema, no skin discoloration or tenderness.  No palpable  cords. LYMPH NODES: No palpable cervical, supraclavicular, axillary or inguinal adenopathy  NEUROLOGICAL: Unremarkable. PSYCH:  Appropriate.   No visits with results within 3 Day(s) from this visit.  Latest known visit with results is:  Appointment on 04/22/2016  Component Date Value Ref Range Status  . WBC 04/22/2016 3.4* 3.6 - 11.0 K/uL Final  . RBC 04/22/2016 3.32* 3.80 - 5.20 MIL/uL Final  . Hemoglobin 04/22/2016 10.2* 12.0 - 16.0 g/dL Final  . HCT 04/22/2016 29.6* 35.0 - 47.0 % Final  . MCV 04/22/2016 89.1  80.0 - 100.0 fL Final  . MCH 04/22/2016 30.8  26.0 - 34.0 pg Final  . MCHC 04/22/2016 34.5  32.0 - 36.0 g/dL Final  . RDW 04/22/2016 15.3* 11.5 - 14.5 % Final  . Platelets 04/22/2016 105* 150 - 440 K/uL Final  . Neutrophils Relative % 04/22/2016 59  % Final  . Neutro Abs 04/22/2016 2.0  1.4 - 6.5 K/uL Final  . Lymphocytes Relative 04/22/2016 28  % Final  . Lymphs Abs 04/22/2016 1.0  1.0 - 3.6 K/uL Final  . Monocytes Relative 04/22/2016 9  % Final  . Monocytes Absolute 04/22/2016 0.3  0.2 - 0.9 K/uL Final  . Eosinophils Relative 04/22/2016 3  % Final  . Eosinophils Absolute 04/22/2016 0.1  0 - 0.7 K/uL Final  . Basophils Relative 04/22/2016 1  % Final  . Basophils Absolute 04/22/2016 0.0  0 - 0.1 K/uL Final  . Ferritin 04/22/2016 65  11 - 307 ng/mL Final    Assessment:  Kim Ballard is a 81 y.o. female with iron deficiency anemia.  She has had anemia since 12/24/2014.  Hematocrit previously ranged between 32.3 - 34. She been on oral iron for the past 2 months. Iron causes constipation.    EGD on 08/27/2015 revealed LA grade A reflux esophagitis and erythematous mucosa in the gastric body and antrum.  Gastric biopsy revealed healing mucosal injury and focal intestinal metaplasia.   There was iron pill gastritis.  Colonoscopy on 08/27/2015 revealed 1 medium polyp in the cecum (tubulovillous adenoma).  Diet is fair.  She eats chicken or beef every other day. She eats green  leafy vegetables. She denies any cravings.  Workup on 01/19/2016 revealed a hematocrit 29.9, hemoglobin 10.5, MCV 88.3, platelets 152,000, white count 4100 with an ANC of 2600. Reticulocyte count was 2.8%.  The following studies were normal:  ANA, rheumatoid factor, SPEP, free light chain ratio, Coombs, B12, folate.  Ferritin was 38 (low) . Iron studies included a saturation of 14% and TIBC of 462 (high) consistent with iron deficiency anemia.  She received Venofer 200 mg IV x 2 (03/25/2016 and 04/01/2016).  She has had mild thrombocytopenia possibly secondary to ITP.  Platelet count has ranged between  125,000 and 152,000 from 11/12/2013 to present without trend.  Abdominal and pelvic CT scan on 10/28/2015 revealed no hepatosplenomegaly.  Symptomatically, she remains fatigued.   Exam reveals no adenopathy or hepatosplenomegaly.  She denies any melena or hematochezia.  Hematocrit is 29.6.  Ferritin is 65.  Plan: 1.  Review labs from 04/22/2016.  Hematocrit decreased despite iron.  Ferritin increased.  Platelet count decreased.  Discuss potential other etiologies.  She may require a bone marrow aspirate and biopsy. 2.  No IV iron today. 3.  Influenza vaccine today. 4.  Mammogram. 5.  RTC in 1 month for MD assess and labs (CBC with diff, CMP, hepatitis B core antibody total, hepatitis B surface antigen hepatitis C antibody, ESR, iron studies).   Lequita Asal, MD  04/26/2016, 12:25 PM

## 2016-04-27 ENCOUNTER — Encounter: Payer: Self-pay | Admitting: *Deleted

## 2016-05-02 ENCOUNTER — Encounter
Admission: RE | Disposition: A | Discharge status: Home / Self Care | Payer: Self-pay | Source: Ambulatory Visit | Attending: Unknown Physician Specialty

## 2016-05-02 ENCOUNTER — Ambulatory Visit: Payer: Medicare PPO | Admitting: Anesthesiology

## 2016-05-02 ENCOUNTER — Ambulatory Visit
Admission: RE | Admit: 2016-05-02 | Discharge: 2016-05-02 | Disposition: A | Discharge status: Home / Self Care | Payer: Medicare PPO | Source: Ambulatory Visit | Attending: Unknown Physician Specialty | Admitting: Unknown Physician Specialty

## 2016-05-02 DIAGNOSIS — Z1211 Encounter for screening for malignant neoplasm of colon: Secondary | ICD-10-CM | POA: Insufficient documentation

## 2016-05-02 DIAGNOSIS — I10 Essential (primary) hypertension: Secondary | ICD-10-CM | POA: Diagnosis not present

## 2016-05-02 DIAGNOSIS — Z9049 Acquired absence of other specified parts of digestive tract: Secondary | ICD-10-CM | POA: Insufficient documentation

## 2016-05-02 DIAGNOSIS — Z79899 Other long term (current) drug therapy: Secondary | ICD-10-CM | POA: Diagnosis not present

## 2016-05-02 DIAGNOSIS — D649 Anemia, unspecified: Secondary | ICD-10-CM | POA: Insufficient documentation

## 2016-05-02 DIAGNOSIS — Z7984 Long term (current) use of oral hypoglycemic drugs: Secondary | ICD-10-CM | POA: Insufficient documentation

## 2016-05-02 DIAGNOSIS — K641 Second degree hemorrhoids: Secondary | ICD-10-CM | POA: Insufficient documentation

## 2016-05-02 DIAGNOSIS — K644 Residual hemorrhoidal skin tags: Secondary | ICD-10-CM | POA: Insufficient documentation

## 2016-05-02 DIAGNOSIS — K219 Gastro-esophageal reflux disease without esophagitis: Secondary | ICD-10-CM | POA: Insufficient documentation

## 2016-05-02 DIAGNOSIS — Z85828 Personal history of other malignant neoplasm of skin: Secondary | ICD-10-CM | POA: Insufficient documentation

## 2016-05-02 DIAGNOSIS — D122 Benign neoplasm of ascending colon: Secondary | ICD-10-CM | POA: Insufficient documentation

## 2016-05-02 DIAGNOSIS — Z86010 Personal history of colonic polyps: Secondary | ICD-10-CM | POA: Diagnosis not present

## 2016-05-02 DIAGNOSIS — D120 Benign neoplasm of cecum: Secondary | ICD-10-CM | POA: Insufficient documentation

## 2016-05-02 DIAGNOSIS — E119 Type 2 diabetes mellitus without complications: Secondary | ICD-10-CM | POA: Diagnosis not present

## 2016-05-02 DIAGNOSIS — K5730 Diverticulosis of large intestine without perforation or abscess without bleeding: Secondary | ICD-10-CM | POA: Insufficient documentation

## 2016-05-02 DIAGNOSIS — Z87891 Personal history of nicotine dependence: Secondary | ICD-10-CM | POA: Diagnosis not present

## 2016-05-02 DIAGNOSIS — M797 Fibromyalgia: Secondary | ICD-10-CM | POA: Diagnosis not present

## 2016-05-02 HISTORY — DX: Allergy, unspecified, initial encounter: T78.40XA

## 2016-05-02 HISTORY — PX: COLONOSCOPY WITH PROPOFOL: SHX5780

## 2016-05-02 HISTORY — DX: Other chronic cystitis with hematuria: N30.21

## 2016-05-02 HISTORY — DX: Nausea: R11.0

## 2016-05-02 LAB — GLUCOSE, CAPILLARY: Glucose-Capillary: 235 mg/dL — ABNORMAL HIGH (ref 65–99)

## 2016-05-02 SURGERY — COLONOSCOPY WITH PROPOFOL
Anesthesia: General

## 2016-05-02 MED ORDER — LIDOCAINE 2% (20 MG/ML) 5 ML SYRINGE
INTRAMUSCULAR | Status: DC | PRN
  Administered 2016-05-02: 40 mg via INTRAVENOUS

## 2016-05-02 MED ORDER — SODIUM CHLORIDE 0.9 % IV SOLN: INTRAVENOUS | Status: DC

## 2016-05-02 MED ORDER — PROPOFOL 500 MG/50ML IV EMUL
INTRAVENOUS | Status: DC | PRN
  Administered 2016-05-02: 140 ug/kg/min via INTRAVENOUS

## 2016-05-02 MED ORDER — FENTANYL CITRATE (PF) 100 MCG/2ML IJ SOLN
INTRAMUSCULAR | Status: DC | PRN
  Administered 2016-05-02: 50 ug via INTRAVENOUS

## 2016-05-02 MED ORDER — SODIUM CHLORIDE 0.9 % IV SOLN
INTRAVENOUS | Status: DC
  Administered 2016-05-02: 08:00:00 via INTRAVENOUS

## 2016-05-02 MED ORDER — MIDAZOLAM HCL 5 MG/5ML IJ SOLN
INTRAMUSCULAR | Status: DC | PRN
  Administered 2016-05-02: 1 mg via INTRAVENOUS

## 2016-05-02 MED ORDER — PROPOFOL 10 MG/ML IV BOLUS
INTRAVENOUS | Status: DC | PRN
  Administered 2016-05-02: 100 mg via INTRAVENOUS

## 2016-05-02 NOTE — Anesthesia Postprocedure Evaluation (Signed)
Anesthesia Post Note  Patient: Kim Ballard  Procedure(s) Performed: Procedure(s) (LRB): COLONOSCOPY WITH PROPOFOL (N/A)  Patient location during evaluation: Endoscopy Anesthesia Type: General Level of consciousness: awake and alert Pain management: pain level controlled Vital Signs Assessment: post-procedure vital signs reviewed and stable Respiratory status: spontaneous breathing and respiratory function stable Cardiovascular status: stable Anesthetic complications: no    Last Vitals:  Vitals:   05/02/16 0803  BP: (!) 176/81  Pulse: 77  Resp: 18  Temp: (!) 35.7 C    Last Pain:  Vitals:   05/02/16 0803  TempSrc: Tympanic                 KEPHART,WILLIAM K

## 2016-05-02 NOTE — Op Note (Signed)
[facility] Gastroenterology Patient Name: Kim Ballard Procedure Date: 05/02/2016 8:24 AM MRN: [id number] Account #: [id number] Date of Birth: 1934-10-21 Admit Type: Outpatient Age: 81 Room: [facility] ENDO ROOM 4 Gender: Female Note Status: Finalized Procedure:            Colonoscopy Indications:          High risk colon cancer surveillance: Personal history                        of colonic polyps Providers:            Manya Silvas, MD Referring MD:         Tracie Harrier, MD (Referring MD) Medicines:            Propofol per Anesthesia Complications:        No immediate complications. Procedure:            Pre-Anesthesia Assessment:                       - After reviewing the risks and benefits, the patient                        was deemed in satisfactory condition to undergo the                        procedure.                       After obtaining informed consent, the colonoscope was                        passed under direct vision. Throughout the procedure,                        the patient's blood pressure, pulse, and oxygen                        saturations were monitored continuously. The                        Colonoscope was introduced through the anus and                        advanced to the the cecum, identified by appendiceal                        orifice and ileocecal valve. The colonoscopy was                        performed without difficulty. The patient tolerated the                        procedure well. The quality of the bowel preparation                        was good. Findings:      Two sessile polyps were found in the ascending colon and cecum. The       polyps were diminutive in size. These polyps were removed with a jumbo       cold forceps. Resection and retrieval were complete.      Multiple  small and large-mouthed diverticula were found in the sigmoid       colon, descending colon, transverse colon and ascending  colon.      External and internal hemorrhoids were found during endoscopy. The       hemorrhoids were medium-sized and Grade II (internal hemorrhoids that       prolapse but reduce spontaneously).      The exam was otherwise without abnormality. Impression:           - Two diminutive polyps in the ascending colon and in                        the cecum, removed with a jumbo cold forceps. Resected                        and retrieved.                       - Diverticulosis in the sigmoid colon, in the                        descending colon, in the transverse colon and in the                        ascending colon.                       - External and internal hemorrhoids.                       - The examination was otherwise normal. Recommendation:       - Await pathology results. Manya Silvas, MD 05/02/2016 8:52:09 AM This report has been signed electronically. Number of Addenda: 0 Note Initiated On: 05/02/2016 8:24 AM Scope Withdrawal Time: 0 hours 12 minutes 41 seconds  Total Procedure Duration: 0 hours 19 minutes 18 seconds       [facility]

## 2016-05-02 NOTE — Anesthesia Preprocedure Evaluation (Signed)
Anesthesia Evaluation  Patient identified by MRN, date of birth, ID band Patient awake    Reviewed: Allergy & Precautions, NPO status , Patient's Chart, lab work & pertinent test results  History of Anesthesia Complications Negative for: history of anesthetic complications  Airway Mallampati: II       Dental  (+) Upper Dentures, Lower Dentures   Pulmonary former smoker,  On inhalers for allergies          Cardiovascular hypertension, Pt. on medications      Neuro/Psych Depression    GI/Hepatic GERD  Medicated,  Endo/Other  diabetes, Type 2, Oral Hypoglycemic Agents  Renal/GU      Musculoskeletal  (+) Arthritis , Fibromyalgia -  Abdominal   Peds  Hematology  (+) anemia ,   Anesthesia Other Findings   Reproductive/Obstetrics                             Anesthesia Physical Anesthesia Plan  ASA: III  Anesthesia Plan: General   Post-op Pain Management:    Induction: Intravenous  Airway Management Planned: Nasal Cannula  Additional Equipment:   Intra-op Plan:   Post-operative Plan:   Informed Consent: I have reviewed the patients History and Physical, chart, labs and discussed the procedure including the risks, benefits and alternatives for the proposed anesthesia with the patient or authorized representative who has indicated his/her understanding and acceptance.     Plan Discussed with:   Anesthesia Plan Comments:         Anesthesia Quick Evaluation

## 2016-05-02 NOTE — H&P (Signed)
Primary Care Physician:  Idelle Crouch, MD Primary Gastroenterologist:  Dr. Vira Agar  Pre-Procedure History & Physical: HPI:  Kim Ballard is a 81 y.o. female is here for Kim colonoscopy.   Past Medical History:  Diagnosis Date  . Allergic state   . Anemia   . Arthritis   . Cancer ([place])    skin cancers  . Chronic cystitis with hematuria   . Depression   . Diabetes mellitus without complication ([place])   . Fibrocystic disease of both breasts   . Fibromyalgia   . GERD (gastroesophageal reflux disease)   . Hypertension   . IBS (irritable bowel syndrome)   . Nausea   . Skin cancer     Past Surgical History:  Procedure Laterality Date  . ABDOMINAL HYSTERECTOMY    . APPENDECTOMY    . CHOLECYSTECTOMY    . COLONOSCOPY    . COLONOSCOPY WITH PROPOFOL N/A 08/27/2015   Procedure: COLONOSCOPY WITH PROPOFOL;  Surgeon: Manya Silvas, MD;  Location: [facility] ENDOSCOPY;  Service: Endoscopy;  Laterality: N/A;  . ESOPHAGOGASTRODUODENOSCOPY    . ESOPHAGOGASTRODUODENOSCOPY (EGD) WITH PROPOFOL N/A 08/27/2015   Procedure: ESOPHAGOGASTRODUODENOSCOPY (EGD) WITH PROPOFOL;  Surgeon: Manya Silvas, MD;  Location: [facility] ENDOSCOPY;  Service: Endoscopy;  Laterality: N/A;  . HERNIA REPAIR      Prior to Admission medications   Medication Sig Start Date End Date Taking? Authorizing Provider  albuterol (PROVENTIL HFA;VENTOLIN HFA) 108 (90 Base) MCG/ACT inhaler Inhale into the lungs every 6 (six) hours as needed for wheezing or shortness of breath.   Yes Historical Provider, MD  beclomethasone (BECONASE-AQ) 42 MCG/SPRAY nasal spray Place into both nostrils 2 (two) times daily. Dose is for each nostril.   Yes Historical Provider, MD  clidinium-chlordiazePOXIDE (LIBRAX) 5-2.5 MG capsule Take 1 capsule by mouth.   Yes Historical Provider, MD  CRANBERRY EXTRACT PO Take by mouth.   Yes Historical Provider, MD  gemfibrozil (LOPID) 600 MG tablet Take 600 mg by mouth 2 (two) times daily before a meal.   Yes  Historical Provider, MD  glipiZIDE (GLUCOTROL) 5 MG tablet Take by mouth daily before breakfast.   Yes Historical Provider, MD  hydrochlorothiazide (HYDRODIURIL) 25 MG tablet Take 25 mg by mouth daily.   Yes Historical Provider, MD  iron polysaccharides (NIFEREX) 150 MG capsule Take by mouth. 12/22/15 12/21/16 Yes Historical Provider, MD  metFORMIN (GLUCOPHAGE) 500 MG tablet Take by mouth 2 (two) times daily with a meal.   Yes Historical Provider, MD  Multiple Vitamins-Minerals (MULTIVITAMIN WITH MINERALS) tablet Take 1 tablet by mouth daily.   Yes Historical Provider, MD  OMEGA-3 FATTY ACIDS PO Take by mouth.   Yes Historical Provider, MD  vitamin B-12 (CYANOCOBALAMIN) 100 MCG tablet Take 100 mcg by mouth daily.   Yes Historical Provider, MD  vitamin E 100 UNIT capsule Take 50 Units by mouth daily.   Yes Historical Provider, MD  cephALEXin (KEFLEX) 250 MG capsule Take by mouth. 12/03/15   Historical Provider, MD  fluticasone (FLONASE) 50 MCG/ACT nasal spray Place into both nostrils daily.    Historical Provider, MD  Lactobacillus Rhamnosus, GG, (CVS PROBIOTIC, LACTOBACILLUS, PO) Take by mouth.    Historical Provider, MD  nitrofurantoin, macrocrystal-monohydrate, (MACROBID) 100 MG capsule Take 100 mg by mouth 2 (two) times daily.    Historical Provider, MD  pantoprazole (PROTONIX) 40 MG tablet Take 40 mg by mouth 2 (two) times daily.    Historical Provider, MD  sucralfate (CARAFATE) 1 g tablet Take 1 g  by mouth 4 (four) times daily -  with meals and at bedtime.    Historical Provider, MD  UNABLE TO FIND TEST EVERY DAY AS DIRECTED 09/01/14   Historical Provider, MD    Allergies as of 04/21/2016 - Review Complete 02/19/2016  Allergen Reaction Noted  . Biaxin [clarithromycin] Other (See Comments) 08/26/2015  . Brompheniramine maleate  08/26/2015  . Ceclor [cefaclor]  08/26/2015  . Ciprofloxacin  08/26/2015  . Clinoril [sulindac]  08/26/2015  . Codeine sulfate  08/26/2015  . Diovan [valsartan]   08/26/2015  . Doxycycline  08/26/2015  . Erythromycin  08/26/2015  . Esomeprazole  08/26/2015  . Guaifenesin & derivatives  08/26/2015  . Levaquin [levofloxacin in d5w]  08/26/2015  . Lisinopril  08/26/2015  . Lodine [etodolac]  08/26/2015  . Macrodantin [nitrofurantoin macrocrystal]  08/26/2015  . Medrol [methylprednisolone]  08/26/2015  . Mobic [meloxicam]  08/26/2015  . Motrin [ibuprofen]  08/26/2015  . Nexium [esomeprazole magnesium]  08/26/2015  . Norvasc [amlodipine besylate]  08/26/2015  . Omnicef [cefdinir]  08/26/2015  . Penicillins  08/26/2015  . Prozac [fluoxetine hcl] Other (See Comments) 08/26/2015  . Pseudoephedrine  08/26/2015  . Reglan [metoclopramide]  08/26/2015  . Seldane [terfenadine]  08/26/2015  . Toprol xl [metoprolol succinate er]  08/26/2015  . Triamterene  08/26/2015  . Tussionex pennkinetic er [hydrocod polst-cpm polst er]  08/26/2015  . Vibramycin [doxycycline calcium]  08/26/2015    Family History  Problem Relation Age of Onset  . Hypertension Mother   . Ovarian cancer Paternal Grandmother   . COPD Father   . Hyperlipidemia Father   . Kidney disease Father   . Diabetes Maternal Grandmother     Social History   Social History  . Marital status: Married    Spouse name: N/A  . Number of children: N/A  . Years of education: N/A   Occupational History  . Not on file.   Social History Main Topics  . Smoking status: Former Research scientist (life sciences)  . Smokeless tobacco: Never Used  . Alcohol use No  . Drug use: No  . Sexual activity: No   Other Topics Concern  . Not on file   Social History Narrative  . No narrative on file    Review of Systems: See HPI, otherwise negative ROS  Physical Exam: BP (!) 176/81   Pulse 77   Temp (!) 96.3 F (35.7 C) (Tympanic)   Resp 18   Ht 5' 7.5" (1.715 m)   Wt 70.8 kg (156 lb)   SpO2 100%   BMI 24.07 kg/m  General:   Alert,  pleasant and cooperative in NAD Head:  Normocephalic and atraumatic. Neck:   Supple; no masses or thyromegaly. Lungs:  Clear throughout to auscultation.    Heart:  Regular rate and rhythm. Abdomen:  Soft, nontender and nondistended. Normal bowel sounds, without guarding, and without rebound.   Neurologic:  Alert and  oriented x4;  grossly normal neurologically.  Impression/Plan: Kim Ballard is here for Kim colonoscopy to be performed for follow up polyp with high grade dysplasia  Risks, benefits, limitations, and alternatives regarding  colonoscopy have been reviewed with the patient.  Questions have been answered.  All parties agreeable.   Gaylyn Cheers, MD  05/02/2016, 8:24 AM

## 2016-05-02 NOTE — Transfer of Care (Signed)
Immediate Anesthesia Transfer of Care Note  Patient: Kim Ballard  Procedure(s) Performed: Procedure(s): COLONOSCOPY WITH PROPOFOL (N/A)  Patient Location: PACU and Endoscopy Unit  Anesthesia Type:General  Level of Consciousness: sedated  Airway & Oxygen Therapy: Patient Spontanous Breathing and Patient connected to nasal cannula oxygen  Post-op Assessment: Report given to RN and Post -op Vital signs reviewed and stable  Post vital signs: Reviewed and stable  Last Vitals:  Vitals:   05/02/16 0803  BP: (!) 176/81  Pulse: 77  Resp: 18  Temp: (!) 35.7 C    Last Pain:  Vitals:   05/02/16 0803  TempSrc: Tympanic         Complications: No apparent anesthesia complications

## 2016-05-03 ENCOUNTER — Encounter: Payer: Self-pay | Admitting: Unknown Physician Specialty

## 2016-05-04 LAB — SURGICAL PATHOLOGY

## 2016-05-20 ENCOUNTER — Other Ambulatory Visit: Payer: Medicare PPO

## 2016-05-20 ENCOUNTER — Ambulatory Visit: Payer: Medicare PPO | Admitting: Hematology and Oncology

## 2016-06-14 ENCOUNTER — Ambulatory Visit
Admission: RE | Admit: 2016-06-14 | Discharge: 2016-06-14 | Disposition: A | Discharge status: Home / Self Care | Payer: Medicare HMO | Source: Ambulatory Visit | Attending: Hematology and Oncology | Admitting: Hematology and Oncology

## 2016-06-14 DIAGNOSIS — Z1231 Encounter for screening mammogram for malignant neoplasm of breast: Secondary | ICD-10-CM | POA: Insufficient documentation

## 2016-06-14 DIAGNOSIS — D61818 Other pancytopenia: Secondary | ICD-10-CM

## 2016-06-14 DIAGNOSIS — Z0000 Encounter for general adult medical examination without abnormal findings: Secondary | ICD-10-CM

## 2016-06-14 DIAGNOSIS — D500 Iron deficiency anemia secondary to blood loss (chronic): Secondary | ICD-10-CM

## 2016-06-14 DIAGNOSIS — Z23 Encounter for immunization: Secondary | ICD-10-CM

## 2016-06-16 ENCOUNTER — Encounter: Payer: Self-pay | Admitting: Hematology and Oncology

## 2016-06-16 ENCOUNTER — Inpatient Hospital Stay: Payer: Medicare HMO | Attending: Hematology and Oncology

## 2016-06-16 ENCOUNTER — Inpatient Hospital Stay (HOSPITAL_BASED_OUTPATIENT_CLINIC_OR_DEPARTMENT_OTHER): Payer: Medicare HMO | Admitting: Hematology and Oncology

## 2016-06-16 VITALS — BP 152/82 | HR 70 | Temp 94.5°F | Resp 18 | Wt 160.1 lb

## 2016-06-16 DIAGNOSIS — M797 Fibromyalgia: Secondary | ICD-10-CM | POA: Diagnosis not present

## 2016-06-16 DIAGNOSIS — N6011 Diffuse cystic mastopathy of right breast: Secondary | ICD-10-CM | POA: Insufficient documentation

## 2016-06-16 DIAGNOSIS — K589 Irritable bowel syndrome without diarrhea: Secondary | ICD-10-CM | POA: Insufficient documentation

## 2016-06-16 DIAGNOSIS — E119 Type 2 diabetes mellitus without complications: Secondary | ICD-10-CM

## 2016-06-16 DIAGNOSIS — Z79899 Other long term (current) drug therapy: Secondary | ICD-10-CM | POA: Insufficient documentation

## 2016-06-16 DIAGNOSIS — R531 Weakness: Secondary | ICD-10-CM

## 2016-06-16 DIAGNOSIS — Z90710 Acquired absence of both cervix and uterus: Secondary | ICD-10-CM | POA: Insufficient documentation

## 2016-06-16 DIAGNOSIS — Z9049 Acquired absence of other specified parts of digestive tract: Secondary | ICD-10-CM | POA: Insufficient documentation

## 2016-06-16 DIAGNOSIS — Z1231 Encounter for screening mammogram for malignant neoplasm of breast: Secondary | ICD-10-CM | POA: Insufficient documentation

## 2016-06-16 DIAGNOSIS — D509 Iron deficiency anemia, unspecified: Secondary | ICD-10-CM | POA: Diagnosis not present

## 2016-06-16 DIAGNOSIS — Z85828 Personal history of other malignant neoplasm of skin: Secondary | ICD-10-CM | POA: Diagnosis not present

## 2016-06-16 DIAGNOSIS — Z7984 Long term (current) use of oral hypoglycemic drugs: Secondary | ICD-10-CM | POA: Insufficient documentation

## 2016-06-16 DIAGNOSIS — Z86010 Personal history of colonic polyps: Secondary | ICD-10-CM | POA: Insufficient documentation

## 2016-06-16 DIAGNOSIS — R5383 Other fatigue: Secondary | ICD-10-CM | POA: Insufficient documentation

## 2016-06-16 DIAGNOSIS — N3021 Other chronic cystitis with hematuria: Secondary | ICD-10-CM

## 2016-06-16 DIAGNOSIS — N6012 Diffuse cystic mastopathy of left breast: Secondary | ICD-10-CM | POA: Diagnosis not present

## 2016-06-16 DIAGNOSIS — Z0000 Encounter for general adult medical examination without abnormal findings: Secondary | ICD-10-CM

## 2016-06-16 DIAGNOSIS — D61818 Other pancytopenia: Secondary | ICD-10-CM

## 2016-06-16 DIAGNOSIS — Z8041 Family history of malignant neoplasm of ovary: Secondary | ICD-10-CM | POA: Diagnosis not present

## 2016-06-16 DIAGNOSIS — K5730 Diverticulosis of large intestine without perforation or abscess without bleeding: Secondary | ICD-10-CM | POA: Diagnosis not present

## 2016-06-16 DIAGNOSIS — Z23 Encounter for immunization: Secondary | ICD-10-CM

## 2016-06-16 DIAGNOSIS — Z87891 Personal history of nicotine dependence: Secondary | ICD-10-CM | POA: Diagnosis not present

## 2016-06-16 DIAGNOSIS — M129 Arthropathy, unspecified: Secondary | ICD-10-CM | POA: Diagnosis not present

## 2016-06-16 DIAGNOSIS — R109 Unspecified abdominal pain: Secondary | ICD-10-CM

## 2016-06-16 DIAGNOSIS — K219 Gastro-esophageal reflux disease without esophagitis: Secondary | ICD-10-CM | POA: Insufficient documentation

## 2016-06-16 DIAGNOSIS — I10 Essential (primary) hypertension: Secondary | ICD-10-CM | POA: Diagnosis not present

## 2016-06-16 DIAGNOSIS — D500 Iron deficiency anemia secondary to blood loss (chronic): Secondary | ICD-10-CM

## 2016-06-16 DIAGNOSIS — D696 Thrombocytopenia, unspecified: Secondary | ICD-10-CM

## 2016-06-16 DIAGNOSIS — F329 Major depressive disorder, single episode, unspecified: Secondary | ICD-10-CM | POA: Diagnosis not present

## 2016-06-16 LAB — COMPREHENSIVE METABOLIC PANEL
ALT: 16 U/L (ref 14–54)
AST: 10 U/L — ABNORMAL LOW (ref 15–41)
Albumin: 4.4 g/dL (ref 3.5–5.0)
Alkaline Phosphatase: 68 U/L (ref 38–126)
Anion gap: 6 (ref 5–15)
BUN: 20 mg/dL (ref 6–20)
CO2: 27 mmol/L (ref 22–32)
Calcium: 9.4 mg/dL (ref 8.9–10.3)
Chloride: 103 mmol/L (ref 101–111)
Creatinine, Ser: 0.81 mg/dL (ref 0.44–1.00)
GFR calc Af Amer: >60 mL/min (ref >60)
GFR calc non Af Amer: >60 mL/min (ref >60)
Glucose, Bld: 254 mg/dL — ABNORMAL HIGH (ref 65–99)
Potassium: 3.9 mmol/L (ref 3.5–5.1)
Sodium: 136 mmol/L (ref 135–145)
Total Bilirubin: 0.5 mg/dL (ref 0.3–1.2)
Total Protein: 6.8 g/dL (ref 6.5–8.1)

## 2016-06-16 LAB — CBC WITH DIFFERENTIAL/PLATELET
Basophils Absolute: 0 10*3/uL (ref 0–0.1)
Basophils Relative: 1 %
Eosinophils Absolute: 0.2 10*3/uL (ref 0–0.7)
Eosinophils Relative: 5 %
HCT: 32.2 % — ABNORMAL LOW (ref 35.0–47.0)
Hemoglobin: 11.2 g/dL — ABNORMAL LOW (ref 12.0–16.0)
Lymphocytes Relative: 25 %
Lymphs Abs: 0.9 10*3/uL — ABNORMAL LOW (ref 1.0–3.6)
MCH: 31.3 pg (ref 26.0–34.0)
MCHC: 34.9 g/dL (ref 32.0–36.0)
MCV: 89.7 fL (ref 80.0–100.0)
Monocytes Absolute: 0.3 10*3/uL (ref 0.2–0.9)
Monocytes Relative: 8 %
Neutro Abs: 2.3 10*3/uL (ref 1.4–6.5)
Neutrophils Relative %: 61 %
Platelets: 113 10*3/uL — ABNORMAL LOW (ref 150–440)
RBC: 3.59 MIL/uL — ABNORMAL LOW (ref 3.80–5.20)
RDW: 13 % (ref 11.5–14.5)
WBC: 3.8 10*3/uL (ref 3.6–11.0)

## 2016-06-16 LAB — FERRITIN: Ferritin: 39 ng/mL (ref 11–307)

## 2016-06-16 LAB — IRON AND TIBC
Iron: 80 ug/dL (ref 28–170)
Saturation Ratios: 18 % (ref 10.4–31.8)
TIBC: 451 ug/dL — ABNORMAL HIGH (ref 250–450)
UIBC: 371 ug/dL

## 2016-06-16 LAB — SEDIMENTATION RATE: Sed Rate: 36 mm/hr — ABNORMAL HIGH (ref 0–30)

## 2016-06-16 NOTE — Progress Notes (Signed)
[facility] day:  06/16/2016   Chief Complaint: Kim Ballard is a 82 y.o. female with pancytopenia who is seen for 6 week assessment.  HPI:  The patient was last seen in medical oncology clinic on 04/26/2016.  At that time, she noted ongoing fatigue and poor sleep secondary to caring for her husband.  Hematocrit and platelet count had decreased despite IV iron.  We discussed evaluation for other etiologies of her low counts.  She received the influenza vaccine.  She was scheduled for colonoscopy.    Colonoscopy on 05/02/2016 by Dr. Gaylyn Cheers revealed 2 diminutive polyps in the ascending colon and in the cecum, removed with a jumbo cold forceps.  Pathology revealed tubular adenoma negative for high grade dysplasia and malignancy.  There was diverticulosis in the sigmoid colon, in the descending colon, in the transverse colon and in the ascending colon.  There were external and internal hemorrhoids.  Bilateral screening mammogram on 06/14/2016 revealed no evidence of malignancy.  Symptomatically, she notes being "tired as usual".  Her diverticulosis is worse.  She comments that her husband's health is "going downhill".   She notes some right sided abdominal discomfort felt possibly due to adhesions.   Past Medical History:  Diagnosis Date  . Allergic state   . Anemia   . Arthritis   . Cancer ([place])    skin cancers  . Chronic cystitis with hematuria   . Depression   . Diabetes mellitus without complication ([place])   . Fibrocystic disease of both breasts   . Fibromyalgia   . GERD (gastroesophageal reflux disease)   . Hypertension   . IBS (irritable bowel syndrome)   . Nausea   . Skin cancer     Past Surgical History:  Procedure Laterality Date  . ABDOMINAL HYSTERECTOMY    . APPENDECTOMY    . BREAST BIOPSY Right 2001   surgical bx   . CHOLECYSTECTOMY    . COLONOSCOPY    . COLONOSCOPY WITH PROPOFOL N/A 08/27/2015   Procedure:  COLONOSCOPY WITH PROPOFOL;  Surgeon: Manya Silvas, MD;  Location: [facility] ENDOSCOPY;  Service: Endoscopy;  Laterality: N/A;  . COLONOSCOPY WITH PROPOFOL N/A 05/02/2016   Procedure: COLONOSCOPY WITH PROPOFOL;  Surgeon: Manya Silvas, MD;  Location: [facility] ENDOSCOPY;  Service: Endoscopy;  Laterality: N/A;  . ESOPHAGOGASTRODUODENOSCOPY    . ESOPHAGOGASTRODUODENOSCOPY (EGD) WITH PROPOFOL N/A 08/27/2015   Procedure: ESOPHAGOGASTRODUODENOSCOPY (EGD) WITH PROPOFOL;  Surgeon: Manya Silvas, MD;  Location: [facility] ENDOSCOPY;  Service: Endoscopy;  Laterality: N/A;  . HERNIA REPAIR      Family History  Problem Relation Age of Onset  . Hypertension Mother   . Ovarian cancer Paternal Grandmother   . COPD Father   . Hyperlipidemia Father   . Kidney disease Father   . Diabetes Maternal Grandmother     Social History:  reports that she has quit smoking. She has never used smokeless tobacco. She reports that she does not drink alcohol or use drugs.  She lives in [place].  Her husband has been bedridden for 3 years. He has Parkinson's and dementia.  His health is declining.  She is his primary caregiver.  The patient is alone today.  Allergies:  Allergies  Allergen Reactions  . Biaxin [Clarithromycin] Other (See Comments)    GI UPSET   . Brompheniramine Maleate   . Ceclor [Cefaclor]   . Ciprofloxacin   . Clinoril [Sulindac]   . Codeine Sulfate   .  Diovan [Valsartan]   . Doxycycline   . Erythromycin   . Esomeprazole   . Fluoxetine Other (See Comments)    UNKNOWN  . Guaifenesin & Derivatives   . Levaquin [Levofloxacin In D5w]   . Lisinopril   . Lodine [Etodolac]   . Macrodantin [Nitrofurantoin Macrocrystal]   . Medrol [Methylprednisolone]   . Mobic [Meloxicam]   . Motrin [Ibuprofen]   . Nexium [Esomeprazole Magnesium]   . Norvasc [Amlodipine Besylate]   . Omnicef [Cefdinir]   . Penicillins   . Prozac [Fluoxetine Hcl] Other (See Comments)    Makes her feel crazy   .  Pseudoephedrine   . Reglan [Metoclopramide]   . Seldane [Terfenadine]   . Sulfa Antibiotics Other (See Comments)  . Toprol Xl [Metoprolol Tartrate]   . Triamterene   . Tussionex Pennkinetic Er [Hydrocod Polst-Cpm Polst Er]   . Vibramycin [Doxycycline Calcium]     Current Medications: Current Outpatient Prescriptions  Medication Sig Dispense Refill  . albuterol (PROVENTIL HFA;VENTOLIN HFA) 108 (90 Base) MCG/ACT inhaler Inhale into the lungs every 6 (six) hours as needed for wheezing or shortness of breath.    . beclomethasone (BECONASE-AQ) 42 MCG/SPRAY nasal spray Place into both nostrils 2 (two) times daily. Dose is for each nostril.    . clidinium-chlordiazePOXIDE (LIBRAX) 5-2.5 MG capsule Take 1 capsule by mouth.    Tilman Neat EXTRACT PO Take by mouth.    . fluticasone (FLONASE) 50 MCG/ACT nasal spray Place into both nostrils daily.    Marland Kitchen gemfibrozil (LOPID) 600 MG tablet Take 600 mg by mouth 2 (two) times daily before a meal.    . glipiZIDE (GLUCOTROL) 5 MG tablet Take by mouth daily before breakfast.    . hydrochlorothiazide (HYDRODIURIL) 25 MG tablet Take 25 mg by mouth daily.    . iron polysaccharides (NIFEREX) 150 MG capsule Take by mouth.    . Lactobacillus Rhamnosus, GG, ([employer] PROBIOTIC, LACTOBACILLUS, PO) Take by mouth.    . metFORMIN (GLUCOPHAGE) 500 MG tablet Take by mouth 2 (two) times daily with a meal.    . Multiple Vitamins-Minerals (MULTIVITAMIN WITH MINERALS) tablet Take 1 tablet by mouth daily.    . nitrofurantoin, macrocrystal-monohydrate, (MACROBID) 100 MG capsule Take 100 mg by mouth 2 (two) times daily.    . OMEGA-3 FATTY ACIDS PO Take by mouth.    . pantoprazole (PROTONIX) 40 MG tablet Take 40 mg by mouth 2 (two) times daily.    . sucralfate (CARAFATE) 1 g tablet Take 1 g by mouth 4 (four) times daily -  with meals and at bedtime.    . vitamin B-12 (CYANOCOBALAMIN) 100 MCG tablet Take 100 mcg by mouth daily.    . vitamin E 100 UNIT capsule Take 50 Units by mouth  daily.    . cephALEXin (KEFLEX) 250 MG capsule Take by mouth.    Marland Kitchen UNABLE TO FIND TEST EVERY DAY AS DIRECTED     No current facility-administered medications for this visit.    Facility-Administered Medications Ordered in Other Visits  Medication Dose Route Frequency Provider Last Rate Last Dose  . iron sucrose (VENOFER) injection 200 mg  200 mg Intravenous Once Rosey Bath, MD        Review of Systems:  GENERAL:  Fatigue.  No fevers or sweats.  Weight up 3 pounds from 04/26/2016. PERFORMANCE STATUS (ECOG):  1 HEENT:  Cataracts.  No visual changes, runny nose, sore throat, mouth sores or tenderness. Lungs: No shortness of breath or cough.  No  hemoptysis. Cardiac:  No chest pain, palpitations, orthopnea, or PND. GI:  Right sided abdominal pain.  Diverticulosis.  No nausea, vomiting, diarrhea, melena or hematochezia. GU:  No urgency, frequency, dysuria, or hematuria. Musculoskeletal:  Fibromyalgia.  No back pain.  No joint pain.  No muscle tenderness. Extremities:  No pain or swelling. Skin:  No rashes or skin changes. Neuro:  No headache, numbness or weakness, balance or coordination issues. Endocrine:  Diabetes.  No thyroid issues, hot flashes or night sweats. Psych:  Lots of stress.  Poor sleep.  No mood changes, depression or anxiety. Pain:  No focal pain. Review of systems:  All other systems reviewed and found to be negative.  Physical Exam: Blood pressure (!) 165/79, pulse 69, temperature (!) 94.5 F (34.7 C), temperature source Tympanic, resp. rate 18, weight 160 lb 0.9 oz (72.6 kg). GENERAL:  Well developed, well nourished, woman sitting comfortably in the exam room in no acute distress. MENTAL STATUS:  Alert and oriented to person, place and time. HEAD:  Short black hair.  Normocephalic, atraumatic, face symmetric, no Cushingoid features. EYES:  Glasses.  Blue eyes.  Pupils equal round and reactive to light and accomodation.  No conjunctivitis or scleral  icterus. ENT:  Oropharynx clear without lesion.  Dentures.  Tongue normal. Mucous membranes moist.  RESPIRATORY:  Clear to auscultation without rales, wheezes or rhonchi. CARDIOVASCULAR:  Regular rate and rhythm without murmur, rub or gallop. ABDOMEN:  Soft, non-tender, with active bowel sounds, and no hepatosplenomegaly.  No guarding or rebound tenderness.  No masses. SKIN:  Pale.  No rashes, ulcers or lesions. EXTREMITIES: Arthritic changes.  No edema, no skin discoloration or tenderness.  No palpable cords. LYMPH NODES: No palpable cervical, supraclavicular, axillary or inguinal adenopathy  NEUROLOGICAL: Unremarkable. PSYCH:  Appropriate.   Appointment on 06/16/2016  Component Date Value Ref Range Status  . WBC 06/16/2016 3.8  3.6 - 11.0 K/uL Final  . RBC 06/16/2016 3.59* 3.80 - 5.20 MIL/uL Final  . Hemoglobin 06/16/2016 11.2* 12.0 - 16.0 g/dL Final  . HCT 06/16/2016 32.2* 35.0 - 47.0 % Final  . MCV 06/16/2016 89.7  80.0 - 100.0 fL Final  . MCH 06/16/2016 31.3  26.0 - 34.0 pg Final  . MCHC 06/16/2016 34.9  32.0 - 36.0 g/dL Final  . RDW 06/16/2016 13.0  11.5 - 14.5 % Final  . Platelets 06/16/2016 113* 150 - 440 K/uL Final  . Neutrophils Relative % 06/16/2016 61  % Final  . Neutro Abs 06/16/2016 2.3  1.4 - 6.5 K/uL Final  . Lymphocytes Relative 06/16/2016 25  % Final  . Lymphs Abs 06/16/2016 0.9* 1.0 - 3.6 K/uL Final  . Monocytes Relative 06/16/2016 8  % Final  . Monocytes Absolute 06/16/2016 0.3  0.2 - 0.9 K/uL Final  . Eosinophils Relative 06/16/2016 5  % Final  . Eosinophils Absolute 06/16/2016 0.2  0 - 0.7 K/uL Final  . Basophils Relative 06/16/2016 1  % Final  . Basophils Absolute 06/16/2016 0.0  0 - 0.1 K/uL Final  . Sodium 06/16/2016 136  135 - 145 mmol/L Final  . Potassium 06/16/2016 3.9  3.5 - 5.1 mmol/L Final  . Chloride 06/16/2016 103  101 - 111 mmol/L Final  . CO2 06/16/2016 27  22 - 32 mmol/L Final  . Glucose, Bld 06/16/2016 254* 65 - 99 mg/dL Final  . BUN  06/16/2016 20  6 - 20 mg/dL Final  . Creatinine, Ser 06/16/2016 0.81  0.44 - 1.00 mg/dL Final  . Calcium  06/16/2016 9.4  8.9 - 10.3 mg/dL Final  . Total Protein 06/16/2016 6.8  6.5 - 8.1 g/dL Final  . Albumin 06/16/2016 4.4  3.5 - 5.0 g/dL Final  . AST 06/16/2016 10* 15 - 41 U/L Final  . ALT 06/16/2016 16  14 - 54 U/L Final  . Alkaline Phosphatase 06/16/2016 68  38 - 126 U/L Final  . Total Bilirubin 06/16/2016 0.5  0.3 - 1.2 mg/dL Final  . GFR calc non Af Amer 06/16/2016 >60  >60 mL/min Final  . GFR calc Af Amer 06/16/2016 >60  >60 mL/min Final   Comment: (NOTE) The eGFR has been calculated using the CKD EPI equation. This calculation has not been validated in all clinical situations. eGFR's persistently <60 mL/min signify possible Chronic Kidney Disease.   . Anion gap 06/16/2016 6  5 - 15 Final  . Sed Rate 06/16/2016 36* 0 - 30 mm/hr Final    Assessment:  Kim Ballard is a 82 y.o. female with iron deficiency anemia.  She has had anemia since 12/24/2014.  Hematocrit previously ranged between 32.3 - 34. She is on oral iron. Iron causes constipation.    EGD on 08/27/2015 revealed LA grade A reflux esophagitis and erythematous mucosa in the gastric body and antrum.  Gastric biopsy revealed healing mucosal injury and focal intestinal metaplasia.   There was iron pill gastritis.    Colonoscopy on 08/27/2015 revealed 1 medium polyp in the cecum (tubulovillous adenoma).  Colonoscopy on 05/02/2016 revealed 2 diminutive polyps in the ascending colon and in the cecum.  Pathology revealed tubular adenoma negative for high grade dysplasia and malignancy.   Diet is fair.  She eats chicken or beef every other day. She eats green leafy vegetables. She denies any cravings.  Workup on 01/19/2016 revealed a hematocrit 29.9, hemoglobin 10.5, MCV 88.3, platelets 152,000, white count 4100 with an ANC of 2600. Reticulocyte count was 2.8%.  The following studies were normal:  ANA, rheumatoid factor, SPEP,  free light chain ratio, [place], B12, folate.  Ferritin was 38 (low) . Iron studies included a saturation of 14% and TIBC of 462 (high) consistent with iron deficiency anemia.  She received Venofer 200 mg IV x 2 (03/25/2016 and 04/01/2016).  She has had mild thrombocytopenia possibly secondary to ITP.  Platelet count has ranged between 105,000 and 152,000 from 11/12/2013 to present without trend.  Abdominal and pelvic CT scan on 10/28/2015 revealed no hepatosplenomegaly.  Bilateral screening mammogram on 06/14/2016 revealed no evidence of malignancy.  Symptomatically, she remains fatigued.   She cares for her husband.  Exam reveals no adenopathy or hepatosplenomegaly.  Hematocrit has improved from 29.6 to 32.2.  Platelets have improved to 113,000.  WBC is normal.  Ferritin is 39.  Plan: 1.  Labs today:  CBC with diff, CMP, hepatitis B core antibody total, hepatitis B surface antigen, hepatitis C antibody, ESR, ferritin, iron studies. 2.  Discuss improved counts.  No need for intervention.  Continue to monitor. 3.  Continue oral iron. 4.  RTC in 3 months for labs (CBC with diff, ferritin). 5.  RTC in 6 months for MD assessment and labs (CBC with diff, ferritin, iron studies).    Lequita Asal, MD  06/16/2016, 3:25 PM

## 2016-06-16 NOTE — Progress Notes (Signed)
Patient states her last venofer infusion was a push instead of 30 minute drip.  She was very sick afterwards and does not ever want to have her venofer pushed again.

## 2016-06-17 LAB — HEPATITIS B SURFACE ANTIGEN: Hepatitis B Surface Ag: NEGATIVE

## 2016-06-17 LAB — HEPATITIS B CORE ANTIBODY, TOTAL: Hep B Core Total Ab: NEGATIVE

## 2016-06-17 LAB — HEPATITIS C ANTIBODY: HCV Ab: <0.1 s/co ratio (ref 0.0–0.9)

## 2016-06-19 ENCOUNTER — Encounter: Payer: Self-pay | Admitting: Hematology and Oncology

## 2016-06-30 DIAGNOSIS — H52223 Regular astigmatism, bilateral: Secondary | ICD-10-CM | POA: Diagnosis not present

## 2016-06-30 DIAGNOSIS — H2513 Age-related nuclear cataract, bilateral: Secondary | ICD-10-CM | POA: Diagnosis not present

## 2016-06-30 DIAGNOSIS — H5201 Hypermetropia, right eye: Secondary | ICD-10-CM | POA: Diagnosis not present

## 2016-06-30 DIAGNOSIS — E119 Type 2 diabetes mellitus without complications: Secondary | ICD-10-CM | POA: Diagnosis not present

## 2016-06-30 DIAGNOSIS — H5212 Myopia, left eye: Secondary | ICD-10-CM | POA: Diagnosis not present

## 2016-08-19 DIAGNOSIS — I10 Essential (primary) hypertension: Secondary | ICD-10-CM | POA: Diagnosis not present

## 2016-08-19 DIAGNOSIS — Z0000 Encounter for general adult medical examination without abnormal findings: Secondary | ICD-10-CM | POA: Diagnosis not present

## 2016-08-19 DIAGNOSIS — F3289 Other specified depressive episodes: Secondary | ICD-10-CM | POA: Diagnosis not present

## 2016-08-19 DIAGNOSIS — E119 Type 2 diabetes mellitus without complications: Secondary | ICD-10-CM | POA: Diagnosis not present

## 2016-08-19 DIAGNOSIS — R140 Abdominal distension (gaseous): Secondary | ICD-10-CM | POA: Diagnosis not present

## 2016-08-23 DIAGNOSIS — D61818 Other pancytopenia: Secondary | ICD-10-CM | POA: Diagnosis not present

## 2016-08-23 DIAGNOSIS — F3289 Other specified depressive episodes: Secondary | ICD-10-CM | POA: Diagnosis not present

## 2016-08-23 DIAGNOSIS — Z0000 Encounter for general adult medical examination without abnormal findings: Secondary | ICD-10-CM | POA: Diagnosis not present

## 2016-08-23 DIAGNOSIS — K219 Gastro-esophageal reflux disease without esophagitis: Secondary | ICD-10-CM | POA: Diagnosis not present

## 2016-08-23 DIAGNOSIS — E119 Type 2 diabetes mellitus without complications: Secondary | ICD-10-CM | POA: Diagnosis not present

## 2016-08-23 DIAGNOSIS — R140 Abdominal distension (gaseous): Secondary | ICD-10-CM | POA: Diagnosis not present

## 2016-09-14 ENCOUNTER — Inpatient Hospital Stay: Payer: Medicare HMO

## 2016-09-21 ENCOUNTER — Inpatient Hospital Stay: Payer: Medicare HMO | Attending: Hematology and Oncology

## 2016-09-21 DIAGNOSIS — R8290 Unspecified abnormal findings in urine: Secondary | ICD-10-CM | POA: Diagnosis not present

## 2016-09-21 DIAGNOSIS — L570 Actinic keratosis: Secondary | ICD-10-CM | POA: Diagnosis not present

## 2016-09-21 DIAGNOSIS — D500 Iron deficiency anemia secondary to blood loss (chronic): Secondary | ICD-10-CM | POA: Diagnosis not present

## 2016-09-21 DIAGNOSIS — L821 Other seborrheic keratosis: Secondary | ICD-10-CM | POA: Diagnosis not present

## 2016-09-21 DIAGNOSIS — D485 Neoplasm of uncertain behavior of skin: Secondary | ICD-10-CM | POA: Diagnosis not present

## 2016-09-21 DIAGNOSIS — Z79899 Other long term (current) drug therapy: Secondary | ICD-10-CM | POA: Insufficient documentation

## 2016-09-21 DIAGNOSIS — X32XXXA Exposure to sunlight, initial encounter: Secondary | ICD-10-CM | POA: Diagnosis not present

## 2016-09-21 DIAGNOSIS — D696 Thrombocytopenia, unspecified: Secondary | ICD-10-CM

## 2016-09-21 LAB — CBC WITH DIFFERENTIAL/PLATELET
Basophils Absolute: 0 10*3/uL (ref 0–0.1)
Basophils Relative: 1 %
Eosinophils Absolute: 0.1 10*3/uL (ref 0–0.7)
Eosinophils Relative: 3 %
HCT: 32.5 % — ABNORMAL LOW (ref 35.0–47.0)
Hemoglobin: 11.6 g/dL — ABNORMAL LOW (ref 12.0–16.0)
Lymphocytes Relative: 23 %
Lymphs Abs: 1.1 10*3/uL (ref 1.0–3.6)
MCH: 31.7 pg (ref 26.0–34.0)
MCHC: 35.8 g/dL (ref 32.0–36.0)
MCV: 88.6 fL (ref 80.0–100.0)
Monocytes Absolute: 0.3 10*3/uL (ref 0.2–0.9)
Monocytes Relative: 7 %
Neutro Abs: 3.1 10*3/uL (ref 1.4–6.5)
Neutrophils Relative %: 66 %
Platelets: 127 10*3/uL — ABNORMAL LOW (ref 150–440)
RBC: 3.66 MIL/uL — ABNORMAL LOW (ref 3.80–5.20)
RDW: 13.2 % (ref 11.5–14.5)
WBC: 4.7 10*3/uL (ref 3.6–11.0)

## 2016-09-21 LAB — FERRITIN: Ferritin: 36 ng/mL (ref 11–307)

## 2016-09-30 ENCOUNTER — Inpatient Hospital Stay: Payer: Medicare HMO

## 2016-09-30 VITALS — BP 138/74 | HR 80 | Temp 96.2°F | Resp 20

## 2016-09-30 DIAGNOSIS — D500 Iron deficiency anemia secondary to blood loss (chronic): Secondary | ICD-10-CM | POA: Diagnosis not present

## 2016-09-30 DIAGNOSIS — D508 Other iron deficiency anemias: Secondary | ICD-10-CM

## 2016-09-30 DIAGNOSIS — M7989 Other specified soft tissue disorders: Secondary | ICD-10-CM | POA: Diagnosis not present

## 2016-09-30 DIAGNOSIS — M25571 Pain in right ankle and joints of right foot: Secondary | ICD-10-CM | POA: Diagnosis not present

## 2016-09-30 DIAGNOSIS — Z79899 Other long term (current) drug therapy: Secondary | ICD-10-CM | POA: Diagnosis not present

## 2016-09-30 MED ORDER — SODIUM CHLORIDE 0.9 % IV SOLN: 200.0000 mg | Freq: Once | INTRAVENOUS | Status: DC

## 2016-09-30 MED ORDER — IRON SUCROSE 20 MG/ML IV SOLN
200.0000 mg | Freq: Once | INTRAVENOUS | Status: AC
  Administered 2016-09-30: 200 mg via INTRAVENOUS
  Filled 2016-09-30: qty 10

## 2016-09-30 MED ORDER — SODIUM CHLORIDE 0.9 % IV SOLN
Freq: Once | INTRAVENOUS | Status: AC
  Administered 2016-09-30: 14:00:00 via INTRAVENOUS
  Filled 2016-09-30: qty 1000

## 2016-10-05 DIAGNOSIS — M25571 Pain in right ankle and joints of right foot: Secondary | ICD-10-CM | POA: Diagnosis not present

## 2016-10-07 ENCOUNTER — Inpatient Hospital Stay: Payer: Medicare HMO | Attending: Hematology and Oncology

## 2016-10-07 VITALS — BP 135/73 | HR 78 | Temp 97.4°F | Resp 18

## 2016-10-07 DIAGNOSIS — D509 Iron deficiency anemia, unspecified: Secondary | ICD-10-CM

## 2016-10-07 DIAGNOSIS — Z79899 Other long term (current) drug therapy: Secondary | ICD-10-CM | POA: Insufficient documentation

## 2016-10-07 DIAGNOSIS — D500 Iron deficiency anemia secondary to blood loss (chronic): Secondary | ICD-10-CM | POA: Insufficient documentation

## 2016-10-07 MED ORDER — IRON SUCROSE 20 MG/ML IV SOLN
200.0000 mg | Freq: Once | INTRAVENOUS | Status: AC
  Administered 2016-10-07: 200 mg via INTRAVENOUS
  Filled 2016-10-07: qty 10

## 2016-10-07 MED ORDER — SODIUM CHLORIDE 0.9 % IV SOLN
Freq: Once | INTRAVENOUS | Status: AC
  Administered 2016-10-07: 14:00:00 via INTRAVENOUS
  Filled 2016-10-07: qty 1000

## 2016-10-07 MED ORDER — SODIUM CHLORIDE 0.9 % IV SOLN: 200.0000 mg | Freq: Once | INTRAVENOUS | Status: DC

## 2016-10-11 DIAGNOSIS — L851 Acquired keratosis [keratoderma] palmaris et plantaris: Secondary | ICD-10-CM | POA: Diagnosis not present

## 2016-10-11 DIAGNOSIS — E1142 Type 2 diabetes mellitus with diabetic polyneuropathy: Secondary | ICD-10-CM | POA: Diagnosis not present

## 2016-10-11 DIAGNOSIS — M7671 Peroneal tendinitis, right leg: Secondary | ICD-10-CM | POA: Diagnosis not present

## 2016-10-17 ENCOUNTER — Telehealth: Payer: Self-pay | Admitting: *Deleted

## 2016-10-17 NOTE — Telephone Encounter (Signed)
Called to inquire if she needs another iron infusion. States she was instructed to call back Friday to find out. Please advise

## 2016-10-17 NOTE — Telephone Encounter (Signed)
Kim Ballard, I called patient back with MD instructions.

## 2016-10-17 NOTE — Telephone Encounter (Signed)
Called patient to inform her that per MD she does not need iron infusion at this time.  Ferritin and hgb are good right now.  Keep next scheduled appointment and call in the meantime if she does not begin to feel better.  Patient verbalized understanding.

## 2016-10-19 ENCOUNTER — Other Ambulatory Visit: Payer: Self-pay | Admitting: Podiatry

## 2016-10-19 DIAGNOSIS — M7671 Peroneal tendinitis, right leg: Secondary | ICD-10-CM

## 2016-10-20 DIAGNOSIS — D485 Neoplasm of uncertain behavior of skin: Secondary | ICD-10-CM | POA: Diagnosis not present

## 2016-10-20 DIAGNOSIS — D0439 Carcinoma in situ of skin of other parts of face: Secondary | ICD-10-CM | POA: Diagnosis not present

## 2016-10-25 DIAGNOSIS — M9901 Segmental and somatic dysfunction of cervical region: Secondary | ICD-10-CM | POA: Diagnosis not present

## 2016-10-25 DIAGNOSIS — M5413 Radiculopathy, cervicothoracic region: Secondary | ICD-10-CM | POA: Diagnosis not present

## 2016-10-27 DIAGNOSIS — M9901 Segmental and somatic dysfunction of cervical region: Secondary | ICD-10-CM | POA: Diagnosis not present

## 2016-10-27 DIAGNOSIS — M5413 Radiculopathy, cervicothoracic region: Secondary | ICD-10-CM | POA: Diagnosis not present

## 2016-11-01 ENCOUNTER — Ambulatory Visit
Admission: RE | Admit: 2016-11-01 | Discharge: 2016-11-01 | Disposition: A | Discharge status: Home / Self Care | Payer: Medicare HMO | Source: Ambulatory Visit | Attending: Podiatry | Admitting: Podiatry

## 2016-11-01 DIAGNOSIS — M5413 Radiculopathy, cervicothoracic region: Secondary | ICD-10-CM | POA: Diagnosis not present

## 2016-11-01 DIAGNOSIS — M7671 Peroneal tendinitis, right leg: Secondary | ICD-10-CM | POA: Diagnosis not present

## 2016-11-01 DIAGNOSIS — M65871 Other synovitis and tenosynovitis, right ankle and foot: Secondary | ICD-10-CM | POA: Diagnosis not present

## 2016-11-01 DIAGNOSIS — M25471 Effusion, right ankle: Secondary | ICD-10-CM | POA: Diagnosis not present

## 2016-11-01 DIAGNOSIS — M9901 Segmental and somatic dysfunction of cervical region: Secondary | ICD-10-CM | POA: Diagnosis not present

## 2016-11-03 DIAGNOSIS — M5413 Radiculopathy, cervicothoracic region: Secondary | ICD-10-CM | POA: Diagnosis not present

## 2016-11-03 DIAGNOSIS — M9901 Segmental and somatic dysfunction of cervical region: Secondary | ICD-10-CM | POA: Diagnosis not present

## 2016-11-07 DIAGNOSIS — M5413 Radiculopathy, cervicothoracic region: Secondary | ICD-10-CM | POA: Diagnosis not present

## 2016-11-07 DIAGNOSIS — M9901 Segmental and somatic dysfunction of cervical region: Secondary | ICD-10-CM | POA: Diagnosis not present

## 2016-11-10 DIAGNOSIS — M5413 Radiculopathy, cervicothoracic region: Secondary | ICD-10-CM | POA: Diagnosis not present

## 2016-11-10 DIAGNOSIS — M9901 Segmental and somatic dysfunction of cervical region: Secondary | ICD-10-CM | POA: Diagnosis not present

## 2016-11-14 DIAGNOSIS — M5413 Radiculopathy, cervicothoracic region: Secondary | ICD-10-CM | POA: Diagnosis not present

## 2016-11-14 DIAGNOSIS — M9901 Segmental and somatic dysfunction of cervical region: Secondary | ICD-10-CM | POA: Diagnosis not present

## 2016-11-16 DIAGNOSIS — D0439 Carcinoma in situ of skin of other parts of face: Secondary | ICD-10-CM | POA: Diagnosis not present

## 2016-11-16 DIAGNOSIS — T498X5A Adverse effect of other topical agents, initial encounter: Secondary | ICD-10-CM | POA: Diagnosis not present

## 2016-11-28 DIAGNOSIS — E1142 Type 2 diabetes mellitus with diabetic polyneuropathy: Secondary | ICD-10-CM | POA: Diagnosis not present

## 2016-11-28 DIAGNOSIS — E538 Deficiency of other specified B group vitamins: Secondary | ICD-10-CM | POA: Diagnosis not present

## 2016-11-28 DIAGNOSIS — E1165 Type 2 diabetes mellitus with hyperglycemia: Secondary | ICD-10-CM | POA: Diagnosis not present

## 2016-12-15 ENCOUNTER — Inpatient Hospital Stay (HOSPITAL_BASED_OUTPATIENT_CLINIC_OR_DEPARTMENT_OTHER): Payer: Medicare HMO | Admitting: Hematology and Oncology

## 2016-12-15 ENCOUNTER — Encounter: Payer: Self-pay | Admitting: Hematology and Oncology

## 2016-12-15 ENCOUNTER — Inpatient Hospital Stay: Payer: Medicare HMO | Attending: Hematology and Oncology

## 2016-12-15 VITALS — BP 130/69 | HR 73 | Temp 96.6°F | Resp 18 | Wt 154.2 lb

## 2016-12-15 DIAGNOSIS — I10 Essential (primary) hypertension: Secondary | ICD-10-CM

## 2016-12-15 DIAGNOSIS — D696 Thrombocytopenia, unspecified: Secondary | ICD-10-CM

## 2016-12-15 DIAGNOSIS — R197 Diarrhea, unspecified: Secondary | ICD-10-CM | POA: Diagnosis not present

## 2016-12-15 DIAGNOSIS — N6012 Diffuse cystic mastopathy of left breast: Secondary | ICD-10-CM | POA: Diagnosis not present

## 2016-12-15 DIAGNOSIS — Z87891 Personal history of nicotine dependence: Secondary | ICD-10-CM

## 2016-12-15 DIAGNOSIS — E119 Type 2 diabetes mellitus without complications: Secondary | ICD-10-CM

## 2016-12-15 DIAGNOSIS — M797 Fibromyalgia: Secondary | ICD-10-CM | POA: Insufficient documentation

## 2016-12-15 DIAGNOSIS — Z86010 Personal history of colonic polyps: Secondary | ICD-10-CM | POA: Diagnosis not present

## 2016-12-15 DIAGNOSIS — Z79899 Other long term (current) drug therapy: Secondary | ICD-10-CM

## 2016-12-15 DIAGNOSIS — N6011 Diffuse cystic mastopathy of right breast: Secondary | ICD-10-CM | POA: Diagnosis not present

## 2016-12-15 DIAGNOSIS — Z8041 Family history of malignant neoplasm of ovary: Secondary | ICD-10-CM | POA: Insufficient documentation

## 2016-12-15 DIAGNOSIS — D500 Iron deficiency anemia secondary to blood loss (chronic): Secondary | ICD-10-CM | POA: Insufficient documentation

## 2016-12-15 DIAGNOSIS — Z7984 Long term (current) use of oral hypoglycemic drugs: Secondary | ICD-10-CM | POA: Insufficient documentation

## 2016-12-15 DIAGNOSIS — F329 Major depressive disorder, single episode, unspecified: Secondary | ICD-10-CM | POA: Diagnosis not present

## 2016-12-15 DIAGNOSIS — Z85828 Personal history of other malignant neoplasm of skin: Secondary | ICD-10-CM

## 2016-12-15 DIAGNOSIS — R5383 Other fatigue: Secondary | ICD-10-CM

## 2016-12-15 DIAGNOSIS — K219 Gastro-esophageal reflux disease without esophagitis: Secondary | ICD-10-CM | POA: Diagnosis not present

## 2016-12-15 DIAGNOSIS — K589 Irritable bowel syndrome without diarrhea: Secondary | ICD-10-CM | POA: Insufficient documentation

## 2016-12-15 DIAGNOSIS — Z8719 Personal history of other diseases of the digestive system: Secondary | ICD-10-CM

## 2016-12-15 LAB — CBC WITH DIFFERENTIAL/PLATELET
Basophils Absolute: 0 10*3/uL (ref 0–0.1)
Basophils Relative: 1 %
Eosinophils Absolute: 0.2 10*3/uL (ref 0–0.7)
Eosinophils Relative: 4 %
HCT: 33.2 % — ABNORMAL LOW (ref 35.0–47.0)
Hemoglobin: 11.8 g/dL — ABNORMAL LOW (ref 12.0–16.0)
Lymphocytes Relative: 23 %
Lymphs Abs: 1.1 10*3/uL (ref 1.0–3.6)
MCH: 32.2 pg (ref 26.0–34.0)
MCHC: 35.7 g/dL (ref 32.0–36.0)
MCV: 90.3 fL (ref 80.0–100.0)
Monocytes Absolute: 0.4 10*3/uL (ref 0.2–0.9)
Monocytes Relative: 8 %
Neutro Abs: 3 10*3/uL (ref 1.4–6.5)
Neutrophils Relative %: 64 %
Platelets: 119 10*3/uL — ABNORMAL LOW (ref 150–440)
RBC: 3.67 MIL/uL — ABNORMAL LOW (ref 3.80–5.20)
RDW: 13.3 % (ref 11.5–14.5)
WBC: 4.7 10*3/uL (ref 3.6–11.0)

## 2016-12-15 LAB — IRON AND TIBC
Iron: 103 ug/dL (ref 28–170)
Saturation Ratios: 23 % (ref 10.4–31.8)
TIBC: 451 ug/dL — ABNORMAL HIGH (ref 250–450)
UIBC: 348 ug/dL

## 2016-12-15 LAB — FERRITIN: Ferritin: 128 ng/mL (ref 11–307)

## 2016-12-15 NOTE — Progress Notes (Signed)
[facility] day:  12/15/2016   Chief Complaint: Kim Ballard is a 82 y.o. female with pancytopenia who is seen for 6 month assessment.  HPI:  The patient was last seen in medical oncology clinic on 06/16/2016.  At that time, she remained fatigued.   She cared for her husband.  Exam revealed no adenopathy or hepatosplenomegaly.  Hematocrit had improved from 29.6 to 32.2.  Platelets had  improved to 113,000.  WBC was normal.  Ferritin was 39.  Hepatitis B and C testing was negative.  She continued oral iron.  CBC on 09/21/2016 revealed a hematocrit 32.5, hemoglobin 11.6, MCV 88.6, platelets 127,000, white count 4700 with an ANC of 3100. Ferritin was 36.  She received Venofer on 09/30/2016 and 10/07/2016.  Symptomatically, she is felt better since her better for her. She notes some issues with a "rolling stomach". She has had some diarrhea. Her glipizide was increased. She had some "cancer spots" frozen off her face. She blistered after 5 fluorouracil cream. She continues to struggle following the loss of her husband.   Past Medical History:  Diagnosis Date  . Allergic state   . Anemia   . Arthritis   . Cancer ([place])    skin cancers  . Chronic cystitis with hematuria   . Depression   . Diabetes mellitus without complication ([place])   . Fibrocystic disease of both breasts   . Fibromyalgia   . GERD (gastroesophageal reflux disease)   . Hypertension   . IBS (irritable bowel syndrome)   . Nausea   . Skin cancer     Past Surgical History:  Procedure Laterality Date  . ABDOMINAL HYSTERECTOMY    . APPENDECTOMY    . BREAST BIOPSY Right 2001   surgical bx   . CHOLECYSTECTOMY    . COLONOSCOPY    . COLONOSCOPY WITH PROPOFOL N/A 08/27/2015   Procedure: COLONOSCOPY WITH PROPOFOL;  Surgeon: Manya Silvas, MD;  Location: [facility] ENDOSCOPY;  Service: Endoscopy;  Laterality: N/A;  . COLONOSCOPY WITH PROPOFOL N/A 05/02/2016   Procedure: COLONOSCOPY  WITH PROPOFOL;  Surgeon: Manya Silvas, MD;  Location: [facility] ENDOSCOPY;  Service: Endoscopy;  Laterality: N/A;  . ESOPHAGOGASTRODUODENOSCOPY    . ESOPHAGOGASTRODUODENOSCOPY (EGD) WITH PROPOFOL N/A 08/27/2015   Procedure: ESOPHAGOGASTRODUODENOSCOPY (EGD) WITH PROPOFOL;  Surgeon: Manya Silvas, MD;  Location: [facility] ENDOSCOPY;  Service: Endoscopy;  Laterality: N/A;  . HERNIA REPAIR      Family History  Problem Relation Age of Onset  . Hypertension Mother   . Ovarian cancer Paternal Grandmother   . COPD Father   . Hyperlipidemia Father   . Kidney disease Father   . Diabetes Maternal Grandmother     Social History:  reports that she has quit smoking. She has never used smokeless tobacco. She reports that she does not drink alcohol or use drugs.  She lives in [place].  Her husband was bedridden for 3 years. He had Parkinson's and dementia.  He died in 10/24/16.  She was his primary caregiver.  The patient is alone today.  Allergies:  Allergies  Allergen Reactions  . Biaxin [Clarithromycin] Other (See Comments)    GI UPSET   . Brompheniramine Maleate   . Ceclor [Cefaclor]   . Ciprofloxacin   . Clinoril [Sulindac]   . Codeine Sulfate   . Diovan [Valsartan]   . Doxycycline   . Erythromycin   . Esomeprazole   . Fluoxetine Other (See Comments)  UNKNOWN  . Guaifenesin & Derivatives   . Levaquin [Levofloxacin In D5w]   . Lisinopril   . Lodine [Etodolac]   . Macrodantin [Nitrofurantoin Macrocrystal]   . Medrol [Methylprednisolone]   . Mobic [Meloxicam]   . Motrin [Ibuprofen]   . Nexium [Esomeprazole Magnesium]   . Nitrofurantoin Other (See Comments)  . Norvasc [Amlodipine Besylate]   . Omnicef [Cefdinir]   . Penicillins   . Prozac [Fluoxetine Hcl] Other (See Comments)    Makes her feel crazy   . Pseudoephedrine   . Reglan [Metoclopramide]   . Seldane [Terfenadine]   . Sulfa Antibiotics Other (See Comments)  . Toprol Xl [Metoprolol Tartrate]   . Triamterene   .  Tussionex Pennkinetic Er [Hydrocod Polst-Cpm Polst Er]   . Vibramycin [Doxycycline Calcium]     Current Medications: Current Outpatient Prescriptions  Medication Sig Dispense Refill  . albuterol (PROVENTIL HFA;VENTOLIN HFA) 108 (90 Base) MCG/ACT inhaler Inhale into the lungs every 6 (six) hours as needed for wheezing or shortness of breath.    . beclomethasone (BECONASE-AQ) 42 MCG/SPRAY nasal spray Place into both nostrils 2 (two) times daily. Dose is for each nostril.    . clidinium-chlordiazePOXIDE (LIBRAX) 5-2.5 MG capsule Take 1 capsule by mouth.    Drusilla Kanner EXTRACT PO Take by mouth.    . fluticasone (FLONASE) 50 MCG/ACT nasal spray Place into both nostrils daily.    Marland Kitchen gemfibrozil (LOPID) 600 MG tablet Take 600 mg by mouth 2 (two) times daily before a meal.    . glipiZIDE (GLUCOTROL) 5 MG tablet Take by mouth daily before breakfast.    . hydrochlorothiazide (HYDRODIURIL) 25 MG tablet Take 25 mg by mouth daily.    . iron polysaccharides (NIFEREX) 150 MG capsule Take by mouth.    . iron polysaccharides (NIFEREX) 150 MG capsule Take 150 mg by mouth daily.    . Lactobacillus Rhamnosus, GG, ([employer] PROBIOTIC, LACTOBACILLUS, PO) Take by mouth.    . metFORMIN (GLUCOPHAGE-XR) 500 MG 24 hr tablet Take 500 mg by mouth 3 (three) times daily.    . Multiple Vitamins-Minerals (MULTIVITAMIN WITH MINERALS) tablet Take 1 tablet by mouth daily.    . OMEGA-3 FATTY ACIDS PO Take by mouth.    . pantoprazole (PROTONIX) 40 MG tablet Take 40 mg by mouth 2 (two) times daily.    . sucralfate (CARAFATE) 1 g tablet Take 1 g by mouth 4 (four) times daily -  with meals and at bedtime.    Marland Kitchen UNABLE TO FIND TEST EVERY DAY AS DIRECTED    . vitamin B-12 (CYANOCOBALAMIN) 100 MCG tablet Take 100 mcg by mouth daily.    . vitamin B-12 (CYANOCOBALAMIN) 1000 MCG tablet Take 1,000 mg by mouth 2 (two) times daily.    . vitamin E 100 UNIT capsule Take 50 Units by mouth daily.    . cephALEXin (KEFLEX) 250 MG capsule Take by  mouth.    . nitrofurantoin, macrocrystal-monohydrate, (MACROBID) 100 MG capsule Take 100 mg by mouth 2 (two) times daily.     No current facility-administered medications for this visit.    Facility-Administered Medications Ordered in Other Visits  Medication Dose Route Frequency Provider Last Rate Last Dose  . iron sucrose (VENOFER) injection 200 mg  200 mg Intravenous Once Lequita Asal, MD        Review of Systems:  GENERAL:  Feels "ok".  No fevers or sweats.  Weight down 6 pounds. PERFORMANCE STATUS (ECOG):  1 HEENT:  Cataracts.  No visual changes,  runny nose, sore throat, mouth sores or tenderness. Lungs: No shortness of breath or cough.  No hemoptysis. Cardiac:  No chest pain, palpitations, orthopnea, or PND. GI:  Diverticulosis.  "Rolling stomach".  Diarrhea.  No nausea, vomiting, melena or hematochezia. GU:  No urgency, frequency, dysuria, or hematuria. Musculoskeletal:  Fibromyalgia.  No back pain.  No joint pain.  No muscle tenderness. Extremities:  No pain or swelling. Skin:  Skin cancer frozen off; s/p 5FU cream.  No rashes or skin changes. Neuro:  No headache, numbness or weakness, balance or coordination issues. Endocrine:  Diabetes.  No thyroid issues, hot flashes or night sweats. Psych:  Lots of stress.  Poor sleep.  No mood changes, depression or anxiety. Pain:  No focal pain. Review of systems:  All other systems reviewed and found to be negative.  Physical Exam: Blood pressure 130/69, pulse 73, temperature (!) 96.6 F (35.9 C), temperature source Tympanic, resp. rate 18, weight 154 lb 3 oz (69.9 kg). GENERAL:  Well developed, well nourished, woman sitting comfortably in the exam room in no acute distress. MENTAL STATUS:  Alert and oriented to person, place and time. HEAD:  Short black hair.  Normocephalic, atraumatic, face symmetric, no Cushingoid features. EYES:  Glasses.  Blue eyes.  Pupils equal round and reactive to light and accomodation.  No  conjunctivitis or scleral icterus. ENT:  Oropharynx clear without lesion.  Dentures.  Tongue normal. Mucous membranes moist.  RESPIRATORY:  Clear to auscultation without rales, wheezes or rhonchi. CARDIOVASCULAR:  Regular rate and rhythm without murmur, rub or gallop. ABDOMEN:  Soft, non-tender, with active bowel sounds, and no hepatosplenomegaly.  No guarding or rebound tenderness.  No masses. SKIN:  s/p 5FU cream.  No rashes, ulcers or lesions. EXTREMITIES: Arthritic changes.  No edema, no skin discoloration or tenderness.  No palpable cords. LYMPH NODES: No palpable cervical, supraclavicular, axillary or inguinal adenopathy  NEUROLOGICAL: Unremarkable. PSYCH:  Appropriate.   Appointment on 12/15/2016  Component Date Value Ref Range Status  . WBC 12/15/2016 4.7  3.6 - 11.0 K/uL Final  . RBC 12/15/2016 3.67* 3.80 - 5.20 MIL/uL Final  . Hemoglobin 12/15/2016 11.8* 12.0 - 16.0 g/dL Final  . HCT 12/15/2016 33.2* 35.0 - 47.0 % Final  . MCV 12/15/2016 90.3  80.0 - 100.0 fL Final  . MCH 12/15/2016 32.2  26.0 - 34.0 pg Final  . MCHC 12/15/2016 35.7  32.0 - 36.0 g/dL Final  . RDW 12/15/2016 13.3  11.5 - 14.5 % Final  . Platelets 12/15/2016 119* 150 - 440 K/uL Final  . Neutrophils Relative % 12/15/2016 64  % Final  . Neutro Abs 12/15/2016 3.0  1.4 - 6.5 K/uL Final  . Lymphocytes Relative 12/15/2016 23  % Final  . Lymphs Abs 12/15/2016 1.1  1.0 - 3.6 K/uL Final  . Monocytes Relative 12/15/2016 8  % Final  . Monocytes Absolute 12/15/2016 0.4  0.2 - 0.9 K/uL Final  . Eosinophils Relative 12/15/2016 4  % Final  . Eosinophils Absolute 12/15/2016 0.2  0 - 0.7 K/uL Final  . Basophils Relative 12/15/2016 1  % Final  . Basophils Absolute 12/15/2016 0.0  0 - 0.1 K/uL Final    Assessment:  Kim Ballard is a 82 y.o. female with iron deficiency anemia.  She has had anemia since 12/24/2014.  Hematocrit previously ranged between 32.3 - 34. She was on oral iron. Iron causes constipation.    EGD on  08/27/2015 revealed LA grade A reflux esophagitis and erythematous mucosa  in the gastric body and antrum.  Gastric biopsy revealed healing mucosal injury and focal intestinal metaplasia.   There was iron pill gastritis.    Colonoscopy on 08/27/2015 revealed 1 medium polyp in the cecum (tubulovillous adenoma).  Colonoscopy on 05/02/2016 revealed 2 diminutive polyps in the ascending colon and in the cecum.  Pathology revealed tubular adenoma negative for high grade dysplasia and malignancy.   Diet is fair.  She eats chicken or beef every other day. She eats green leafy vegetables. She denies any cravings.  Workup on 01/19/2016 revealed a hematocrit 29.9, hemoglobin 10.5, MCV 88.3, platelets 152,000, white count 4100 with an ANC of 2600. Reticulocyte count was 2.8%.  The following studies were normal:  ANA, rheumatoid factor, SPEP, free light chain ratio, [place], B12, folate.  Ferritin was 38 (low) . Iron studies included a saturation of 14% and TIBC of 462 (high) consistent with iron deficiency anemia.  She received Venofer 200 mg IV x 2 (03/25/2016 and 04/01/2016) and x 2 (09/30/2016 and 10/07/2016).  She has had mild thrombocytopenia possibly secondary to ITP.  Platelet count has ranged between 105,000 and 152,000 from 11/12/2013 to present without trend.  Abdominal and pelvic CT scan on 10/28/2015 revealed no hepatosplenomegaly.  Bilateral screening mammogram on 06/14/2016 revealed no evidence of malignancy.  Symptomatically, she has struggled since the loss of her husband.  Exam reveals no adenopathy or hepatosplenomegaly.  Hematocrit has improved to 33.2.  Platelets have improved to 119,000.  WBC is normal.  Ferritin is 128.  Plan: 1.  Labs today:  CBC with diff, ferritin, iron studies. 2.  Call patient with ferritin. 3.  RTC in 3 months for labs (CBC with diff, ferritin). 4.  RTC in 6 months for MD assessment and labs (CBC with diff, ferritin, iron studies).   Lequita Asal, MD   12/15/2016, 2:24 PM

## 2016-12-15 NOTE — Progress Notes (Signed)
Patient recently saw PCP.  Metformin was changed to ER.  Patient not sleeping well.  Otherwise, no complaints.

## 2016-12-19 DIAGNOSIS — R140 Abdominal distension (gaseous): Secondary | ICD-10-CM | POA: Diagnosis not present

## 2016-12-19 DIAGNOSIS — E119 Type 2 diabetes mellitus without complications: Secondary | ICD-10-CM | POA: Diagnosis not present

## 2016-12-19 DIAGNOSIS — D61818 Other pancytopenia: Secondary | ICD-10-CM | POA: Diagnosis not present

## 2016-12-19 DIAGNOSIS — E538 Deficiency of other specified B group vitamins: Secondary | ICD-10-CM | POA: Diagnosis not present

## 2016-12-19 DIAGNOSIS — F3289 Other specified depressive episodes: Secondary | ICD-10-CM | POA: Diagnosis not present

## 2016-12-19 DIAGNOSIS — K219 Gastro-esophageal reflux disease without esophagitis: Secondary | ICD-10-CM | POA: Diagnosis not present

## 2016-12-26 DIAGNOSIS — R300 Dysuria: Secondary | ICD-10-CM | POA: Diagnosis not present

## 2016-12-26 DIAGNOSIS — Z0000 Encounter for general adult medical examination without abnormal findings: Secondary | ICD-10-CM | POA: Diagnosis not present

## 2016-12-26 DIAGNOSIS — I10 Essential (primary) hypertension: Secondary | ICD-10-CM | POA: Diagnosis not present

## 2016-12-26 DIAGNOSIS — K219 Gastro-esophageal reflux disease without esophagitis: Secondary | ICD-10-CM | POA: Diagnosis not present

## 2016-12-26 DIAGNOSIS — F3289 Other specified depressive episodes: Secondary | ICD-10-CM | POA: Diagnosis not present

## 2016-12-26 DIAGNOSIS — E119 Type 2 diabetes mellitus without complications: Secondary | ICD-10-CM | POA: Diagnosis not present

## 2017-01-05 DIAGNOSIS — L821 Other seborrheic keratosis: Secondary | ICD-10-CM | POA: Diagnosis not present

## 2017-01-05 DIAGNOSIS — D044 Carcinoma in situ of skin of scalp and neck: Secondary | ICD-10-CM | POA: Diagnosis not present

## 2017-01-11 DIAGNOSIS — H2513 Age-related nuclear cataract, bilateral: Secondary | ICD-10-CM | POA: Diagnosis not present

## 2017-01-11 DIAGNOSIS — H5212 Myopia, left eye: Secondary | ICD-10-CM | POA: Diagnosis not present

## 2017-01-11 DIAGNOSIS — H5201 Hypermetropia, right eye: Secondary | ICD-10-CM | POA: Diagnosis not present

## 2017-01-11 DIAGNOSIS — H52223 Regular astigmatism, bilateral: Secondary | ICD-10-CM | POA: Diagnosis not present

## 2017-01-11 DIAGNOSIS — H1851 Endothelial corneal dystrophy: Secondary | ICD-10-CM | POA: Diagnosis not present

## 2017-01-11 DIAGNOSIS — E119 Type 2 diabetes mellitus without complications: Secondary | ICD-10-CM | POA: Diagnosis not present

## 2017-01-25 DIAGNOSIS — H2513 Age-related nuclear cataract, bilateral: Secondary | ICD-10-CM | POA: Diagnosis not present

## 2017-02-16 DIAGNOSIS — H2511 Age-related nuclear cataract, right eye: Secondary | ICD-10-CM | POA: Diagnosis not present

## 2017-02-21 DIAGNOSIS — E1142 Type 2 diabetes mellitus with diabetic polyneuropathy: Secondary | ICD-10-CM | POA: Diagnosis not present

## 2017-02-23 DIAGNOSIS — N3020 Other chronic cystitis without hematuria: Secondary | ICD-10-CM | POA: Diagnosis not present

## 2017-02-23 DIAGNOSIS — N3941 Urge incontinence: Secondary | ICD-10-CM | POA: Diagnosis not present

## 2017-02-23 DIAGNOSIS — R339 Retention of urine, unspecified: Secondary | ICD-10-CM | POA: Diagnosis not present

## 2017-02-28 DIAGNOSIS — E538 Deficiency of other specified B group vitamins: Secondary | ICD-10-CM | POA: Diagnosis not present

## 2017-02-28 DIAGNOSIS — E11649 Type 2 diabetes mellitus with hypoglycemia without coma: Secondary | ICD-10-CM | POA: Diagnosis not present

## 2017-03-01 ENCOUNTER — Encounter: Payer: Self-pay | Admitting: *Deleted

## 2017-03-02 NOTE — Discharge Instructions (Signed)

## 2017-03-08 ENCOUNTER — Ambulatory Visit
Admission: RE | Admit: 2017-03-08 | Discharge: 2017-03-08 | Disposition: A | Discharge status: Home / Self Care | Payer: Medicare HMO | Source: Ambulatory Visit | Attending: Ophthalmology | Admitting: Ophthalmology

## 2017-03-08 ENCOUNTER — Encounter
Admission: RE | Disposition: A | Discharge status: Home / Self Care | Payer: Self-pay | Source: Ambulatory Visit | Attending: Ophthalmology

## 2017-03-08 ENCOUNTER — Ambulatory Visit: Payer: Medicare HMO | Admitting: Anesthesiology

## 2017-03-08 DIAGNOSIS — E119 Type 2 diabetes mellitus without complications: Secondary | ICD-10-CM | POA: Diagnosis not present

## 2017-03-08 DIAGNOSIS — H2511 Age-related nuclear cataract, right eye: Secondary | ICD-10-CM | POA: Insufficient documentation

## 2017-03-08 DIAGNOSIS — Z90710 Acquired absence of both cervix and uterus: Secondary | ICD-10-CM | POA: Insufficient documentation

## 2017-03-08 DIAGNOSIS — M797 Fibromyalgia: Secondary | ICD-10-CM | POA: Insufficient documentation

## 2017-03-08 DIAGNOSIS — Z9889 Other specified postprocedural states: Secondary | ICD-10-CM | POA: Insufficient documentation

## 2017-03-08 DIAGNOSIS — Z8589 Personal history of malignant neoplasm of other organs and systems: Secondary | ICD-10-CM | POA: Diagnosis not present

## 2017-03-08 DIAGNOSIS — J42 Unspecified chronic bronchitis: Secondary | ICD-10-CM | POA: Insufficient documentation

## 2017-03-08 DIAGNOSIS — Z9049 Acquired absence of other specified parts of digestive tract: Secondary | ICD-10-CM | POA: Diagnosis not present

## 2017-03-08 DIAGNOSIS — Z79899 Other long term (current) drug therapy: Secondary | ICD-10-CM | POA: Diagnosis not present

## 2017-03-08 DIAGNOSIS — D696 Thrombocytopenia, unspecified: Secondary | ICD-10-CM | POA: Insufficient documentation

## 2017-03-08 DIAGNOSIS — Z87891 Personal history of nicotine dependence: Secondary | ICD-10-CM | POA: Diagnosis not present

## 2017-03-08 DIAGNOSIS — I10 Essential (primary) hypertension: Secondary | ICD-10-CM | POA: Diagnosis not present

## 2017-03-08 HISTORY — PX: CATARACT EXTRACTION W/PHACO: SHX586

## 2017-03-08 HISTORY — DX: Presence of dental prosthetic device (complete) (partial): Z97.2

## 2017-03-08 LAB — GLUCOSE, CAPILLARY
Glucose-Capillary: 111 mg/dL — ABNORMAL HIGH (ref 65–99)
Glucose-Capillary: 161 mg/dL — ABNORMAL HIGH (ref 65–99)

## 2017-03-08 SURGERY — PHACOEMULSIFICATION, CATARACT, WITH IOL INSERTION
Anesthesia: Monitor Anesthesia Care | Laterality: Right | Wound class: Clean

## 2017-03-08 MED ORDER — MIDAZOLAM HCL 2 MG/2ML IJ SOLN
INTRAMUSCULAR | Status: DC | PRN
  Administered 2017-03-08: 1.5 mg via INTRAVENOUS

## 2017-03-08 MED ORDER — EPINEPHRINE PF 1 MG/ML IJ SOLN
INTRAOCULAR | Status: DC | PRN
  Administered 2017-03-08: 57 mL via OPHTHALMIC

## 2017-03-08 MED ORDER — LACTATED RINGERS IV SOLN: INTRAVENOUS | Status: DC

## 2017-03-08 MED ORDER — ARMC OPHTHALMIC DILATING DROPS
1.0000 "application " | OPHTHALMIC | Status: DC | PRN
  Administered 2017-03-08 (×3): 1 via OPHTHALMIC

## 2017-03-08 MED ORDER — MOXIFLOXACIN HCL 0.5 % OP SOLN
1.0000 [drp] | OPHTHALMIC | Status: DC | PRN
  Administered 2017-03-08 (×3): 1 [drp] via OPHTHALMIC

## 2017-03-08 MED ORDER — BALANCED SALT IO SOLN
INTRAOCULAR | Status: DC | PRN
  Administered 2017-03-08: 07:00:00
  Administered 2017-03-08: 1 mL

## 2017-03-08 MED ORDER — NA HYALUR & NA CHOND-NA HYALUR 0.4-0.35 ML IO KIT
PACK | INTRAOCULAR | Status: DC | PRN
  Administered 2017-03-08: 1 mL via INTRAOCULAR

## 2017-03-08 MED ORDER — BRIMONIDINE TARTRATE-TIMOLOL 0.2-0.5 % OP SOLN
OPHTHALMIC | Status: DC | PRN
  Administered 2017-03-08: 1 [drp] via OPHTHALMIC

## 2017-03-08 MED ORDER — MOXIFLOXACIN HCL 0.5 % OP SOLN
OPHTHALMIC | Status: DC | PRN
  Administered 2017-03-08: 0.2 mL via OPHTHALMIC

## 2017-03-08 MED ORDER — FENTANYL CITRATE (PF) 100 MCG/2ML IJ SOLN
INTRAMUSCULAR | Status: DC | PRN
  Administered 2017-03-08: 50 ug via INTRAVENOUS

## 2017-03-08 SURGICAL SUPPLY — 25 items
CANNULA ANT/CHMB 27GA (MISCELLANEOUS) ×2 IMPLANT
CARTRIDGE ABBOTT (MISCELLANEOUS) IMPLANT
GLOVE SURG LX 7.5 STRW (GLOVE) ×1
GLOVE SURG LX STRL 7.5 STRW (GLOVE) ×1 IMPLANT
GLOVE SURG TRIUMPH 8.0 PF LTX (GLOVE) ×2 IMPLANT
GOWN STRL REUS W/ TWL LRG LVL3 (GOWN DISPOSABLE) ×2 IMPLANT
GOWN STRL REUS W/TWL LRG LVL3 (GOWN DISPOSABLE) ×2
MARKER SKIN DUAL TIP RULER LAB (MISCELLANEOUS) ×2 IMPLANT
NDL RETROBULBAR .5 NSTRL (NEEDLE) IMPLANT
NEEDLE FILTER BLUNT 18X 1/2SAF (NEEDLE) ×1
NEEDLE FILTER BLUNT 18X1 1/2 (NEEDLE) ×1 IMPLANT
PACK CATARACT BRASINGTON (MISCELLANEOUS) ×2 IMPLANT
PACK EYE AFTER SURG (MISCELLANEOUS) ×2 IMPLANT
PACK OPTHALMIC (MISCELLANEOUS) ×2 IMPLANT
RING MALYGIN 7.0 (MISCELLANEOUS) IMPLANT
SUT ETHILON 10-0 CS-B-6CS-B-6 (SUTURE)
SUT VICRYL  9 0 (SUTURE)
SUT VICRYL 9 0 (SUTURE) IMPLANT
SUTURE EHLN 10-0 CS-B-6CS-B-6 (SUTURE) IMPLANT
SYR 3ML LL SCALE MARK (SYRINGE) ×2 IMPLANT
SYR 5ML LL (SYRINGE) ×2 IMPLANT
SYR TB 1ML LUER SLIP (SYRINGE) ×2 IMPLANT
TORIC ASTIGMATISM IOL SINGLE PIECE ACRYLIC FOLDABL (Intraocular Lens) ×2 IMPLANT
WATER STERILE IRR 250ML POUR (IV SOLUTION) ×2 IMPLANT
WIPE NON LINTING 3.25X3.25 (MISCELLANEOUS) ×2 IMPLANT

## 2017-03-08 NOTE — Anesthesia Procedure Notes (Signed)
Procedure Name: MAC Performed by: Mayme Genta Pre-anesthesia Checklist: Patient identified, Emergency Drugs available, Suction available, Timeout performed and Patient being monitored Patient Re-evaluated:Patient Re-evaluated prior to induction Oxygen Delivery Method: Nasal cannula Placement Confirmation: positive ETCO2

## 2017-03-08 NOTE — Anesthesia Postprocedure Evaluation (Signed)
Anesthesia Post Note  Patient: Kim Ballard  Procedure(s) Performed: CATARACT EXTRACTION PHACO AND INTRAOCULAR LENS PLACEMENT (IOC) RIGHT DIABETIC TORIC (Right )  Patient location during evaluation: PACU Anesthesia Type: MAC Level of consciousness: awake and alert Pain management: pain level controlled Vital Signs Assessment: post-procedure vital signs reviewed and stable Respiratory status: spontaneous breathing, nonlabored ventilation, respiratory function stable and patient connected to nasal cannula oxygen Cardiovascular status: stable and blood pressure returned to baseline Postop Assessment: no apparent nausea or vomiting Anesthetic complications: no    Alisa Graff

## 2017-03-08 NOTE — H&P (Signed)
The History and Physical notes are on paper, have been signed, and are to be scanned. The patient remains stable and unchanged from the H&P.   Previous H&P reviewed, patient examined, and there are no changes.  Kim Ballard 03/08/2017 7:39 AM

## 2017-03-08 NOTE — Transfer of Care (Signed)
Immediate Anesthesia Transfer of Care Note  Patient: Kim Ballard  Procedure(s) Performed: CATARACT EXTRACTION PHACO AND INTRAOCULAR LENS PLACEMENT (IOC) RIGHT DIABETIC TORIC (Right )  Patient Location: PACU  Anesthesia Type: MAC  Level of Consciousness: awake, alert  and patient cooperative  Airway and Oxygen Therapy: Patient Spontanous Breathing and Patient connected to supplemental oxygen  Post-op Assessment: Post-op Vital signs reviewed, Patient's Cardiovascular Status Stable, Respiratory Function Stable, Patent Airway and No signs of Nausea or vomiting  Post-op Vital Signs: Reviewed and stable  Complications: No apparent anesthesia complications

## 2017-03-08 NOTE — Op Note (Signed)
LOCATION:  [place]   PREOPERATIVE DIAGNOSIS:  Nuclear sclerotic cataract of the right eye.  H25.11   POSTOPERATIVE DIAGNOSIS:  Nuclear sclerotic cataract of the right eye.   PROCEDURE:  Phacoemulsification with Toric posterior chamber intraocular lens placement of the right eye.   LENS:   Implant Name Type Inv. Item Serial No. Manufacturer Lot No. LRB No. Used  TORIC ASTIGMATISM IOL SINGLE PIECE ACRYLIC FOLDABLE LENSE Intraocular Lens   [id number] ALCON   Right 1     [id number] 5.5 Toric intraocular lens with 1.50 diopters of cylindrical power with axis orientation at 23 degrees.   ULTRASOUND TIME: 16 % of 1 minutes, 11 seconds.  CDE 11.8   SURGEON:  Wyonia Hough, MD   ANESTHESIA: Topical with tetracaine drops and 2% Xylocaine jelly, augmented with 1% preservative-free intracameral lidocaine. .   COMPLICATIONS:  None.   DESCRIPTION OF PROCEDURE:  The patient was identified in the holding room and transported to the operating suite and placed in the supine position under the operating microscope.  The right eye was identified as the operative eye, and it was prepped and draped in the usual sterile ophthalmic fashion.    A clear-corneal paracentesis incision was made at the 12:00 position.  0.5 ml of preservative-free 1% lidocaine was injected into the anterior chamber. The anterior chamber was filled with Viscoat.  A 2.4 millimeter near clear corneal incision was then made at the 9:00 position.  A cystotome and capsulorrhexis forceps were then used to make a curvilinear capsulorrhexis.  Hydrodissection and hydrodelineation were then performed using balanced salt solution.   Phacoemulsification was then used in stop and chop fashion to remove the lens, nucleus and epinucleus.  The remaining cortex was aspirated using the irrigation and aspiration handpiece.  Provisc viscoelastic was then placed into the capsular bag to distend it for lens placement.  The Verion digital  marker was used to align the implant at the intended axis.   A Toric lens was then injected into the capsular bag.  It was rotated clockwise until the axis marks on the lens were approximately 15 degrees in the counterclockwise direction to the intended alignment.  The viscoelastic was aspirated from the eye using the irrigation aspiration handpiece.  Then, a Koch spatula through the sideport incision was used to rotate the lens in a clockwise direction until the axis markings of the intraocular lens were lined up with the Verion alignment.  Balanced salt solution was then used to hydrate the wounds. Vigamox 0.2 ml of a 1mg  per ml solution was injected into the anterior chamber for a dose of 0.2 mg of intracameral antibiotic at the completion of the case.    The eye was noted to have a physiologic pressure and there was no wound leak noted.   Timolol and Brimonidine drops were applied to the eye.  The patient was taken to the recovery room in stable condition having had no complications of anesthesia or surgery.  Kim Ballard 03/08/2017, 8:05 AM

## 2017-03-08 NOTE — Anesthesia Preprocedure Evaluation (Signed)
Anesthesia Evaluation  Patient identified by MRN, date of birth, ID band Patient awake    Reviewed: Allergy & Precautions, H&P , NPO status , Patient's Chart, lab work & pertinent test results, reviewed documented beta blocker date and time   Airway Mallampati: II  TM Distance: >3 FB Neck ROM: full    Dental no notable dental hx.    Pulmonary former smoker,    Pulmonary exam normal breath sounds clear to auscultation       Cardiovascular Exercise Tolerance: Good hypertension,  Rhythm:regular Rate:Normal     Neuro/Psych negative neurological ROS  negative psych ROS   GI/Hepatic Neg liver ROS, GERD  ,  Endo/Other  diabetes  Renal/GU negative Renal ROS  negative genitourinary   Musculoskeletal   Abdominal   Peds  Hematology  (+) anemia ,   Anesthesia Other Findings   Reproductive/Obstetrics negative OB ROS                             Anesthesia Physical Anesthesia Plan  ASA: II  Anesthesia Plan: MAC   Post-op Pain Management:    Induction:   PONV Risk Score and Plan:   Airway Management Planned:   Additional Equipment:   Intra-op Plan:   Post-operative Plan:   Informed Consent: I have reviewed the patients History and Physical, chart, labs and discussed the procedure including the risks, benefits and alternatives for the proposed anesthesia with the patient or authorized representative who has indicated his/her understanding and acceptance.   Dental Advisory Given  Plan Discussed with: CRNA  Anesthesia Plan Comments:         Anesthesia Quick Evaluation

## 2017-03-09 ENCOUNTER — Encounter: Payer: Self-pay | Admitting: Ophthalmology

## 2017-03-16 ENCOUNTER — Inpatient Hospital Stay: Payer: Medicare HMO

## 2017-03-23 ENCOUNTER — Inpatient Hospital Stay: Payer: Medicare HMO | Attending: Hematology and Oncology

## 2017-03-23 ENCOUNTER — Other Ambulatory Visit: Payer: Self-pay

## 2017-03-23 DIAGNOSIS — D500 Iron deficiency anemia secondary to blood loss (chronic): Secondary | ICD-10-CM

## 2017-03-23 LAB — CBC WITH DIFFERENTIAL/PLATELET
Basophils Absolute: 0 10*3/uL (ref 0–0.1)
Basophils Relative: 1 %
Eosinophils Absolute: 0.1 10*3/uL (ref 0–0.7)
Eosinophils Relative: 3 %
HCT: 34.6 % — ABNORMAL LOW (ref 35.0–47.0)
Hemoglobin: 12 g/dL (ref 12.0–16.0)
Lymphocytes Relative: 25 %
Lymphs Abs: 1.2 10*3/uL (ref 1.0–3.6)
MCH: 32.3 pg (ref 26.0–34.0)
MCHC: 34.7 g/dL (ref 32.0–36.0)
MCV: 93.1 fL (ref 80.0–100.0)
Monocytes Absolute: 0.3 10*3/uL (ref 0.2–0.9)
Monocytes Relative: 7 %
Neutro Abs: 3 10*3/uL (ref 1.4–6.5)
Neutrophils Relative %: 64 %
Platelets: 125 10*3/uL — ABNORMAL LOW (ref 150–440)
RBC: 3.72 MIL/uL — ABNORMAL LOW (ref 3.80–5.20)
RDW: 12.3 % (ref 11.5–14.5)
WBC: 4.7 10*3/uL (ref 3.6–11.0)

## 2017-03-23 LAB — FERRITIN: Ferritin: 94 ng/mL (ref 11–307)

## 2017-03-28 DIAGNOSIS — H2512 Age-related nuclear cataract, left eye: Secondary | ICD-10-CM | POA: Diagnosis not present

## 2017-04-14 NOTE — Discharge Instructions (Signed)

## 2017-04-17 ENCOUNTER — Encounter
Admission: RE | Disposition: A | Discharge status: Home / Self Care | Payer: Self-pay | Source: Ambulatory Visit | Attending: Ophthalmology

## 2017-04-17 ENCOUNTER — Ambulatory Visit: Payer: Medicare HMO | Admitting: Anesthesiology

## 2017-04-17 ENCOUNTER — Ambulatory Visit
Admission: RE | Admit: 2017-04-17 | Discharge: 2017-04-17 | Disposition: A | Discharge status: Home / Self Care | Payer: Medicare HMO | Source: Ambulatory Visit | Attending: Ophthalmology | Admitting: Ophthalmology

## 2017-04-17 DIAGNOSIS — E119 Type 2 diabetes mellitus without complications: Secondary | ICD-10-CM | POA: Diagnosis not present

## 2017-04-17 DIAGNOSIS — Z9889 Other specified postprocedural states: Secondary | ICD-10-CM | POA: Diagnosis not present

## 2017-04-17 DIAGNOSIS — Z90710 Acquired absence of both cervix and uterus: Secondary | ICD-10-CM | POA: Insufficient documentation

## 2017-04-17 DIAGNOSIS — K219 Gastro-esophageal reflux disease without esophagitis: Secondary | ICD-10-CM | POA: Diagnosis not present

## 2017-04-17 DIAGNOSIS — H2512 Age-related nuclear cataract, left eye: Secondary | ICD-10-CM | POA: Insufficient documentation

## 2017-04-17 DIAGNOSIS — Z79899 Other long term (current) drug therapy: Secondary | ICD-10-CM | POA: Diagnosis not present

## 2017-04-17 DIAGNOSIS — Z793 Long term (current) use of hormonal contraceptives: Secondary | ICD-10-CM | POA: Insufficient documentation

## 2017-04-17 DIAGNOSIS — Z87891 Personal history of nicotine dependence: Secondary | ICD-10-CM | POA: Diagnosis not present

## 2017-04-17 DIAGNOSIS — D649 Anemia, unspecified: Secondary | ICD-10-CM | POA: Diagnosis not present

## 2017-04-17 DIAGNOSIS — Z85828 Personal history of other malignant neoplasm of skin: Secondary | ICD-10-CM | POA: Diagnosis not present

## 2017-04-17 DIAGNOSIS — F329 Major depressive disorder, single episode, unspecified: Secondary | ICD-10-CM | POA: Diagnosis not present

## 2017-04-17 DIAGNOSIS — J42 Unspecified chronic bronchitis: Secondary | ICD-10-CM | POA: Diagnosis not present

## 2017-04-17 DIAGNOSIS — D696 Thrombocytopenia, unspecified: Secondary | ICD-10-CM | POA: Diagnosis not present

## 2017-04-17 DIAGNOSIS — I10 Essential (primary) hypertension: Secondary | ICD-10-CM | POA: Diagnosis not present

## 2017-04-17 DIAGNOSIS — Z7951 Long term (current) use of inhaled steroids: Secondary | ICD-10-CM | POA: Insufficient documentation

## 2017-04-17 DIAGNOSIS — M797 Fibromyalgia: Secondary | ICD-10-CM | POA: Diagnosis not present

## 2017-04-17 DIAGNOSIS — Z9049 Acquired absence of other specified parts of digestive tract: Secondary | ICD-10-CM | POA: Insufficient documentation

## 2017-04-17 DIAGNOSIS — M1990 Unspecified osteoarthritis, unspecified site: Secondary | ICD-10-CM | POA: Diagnosis not present

## 2017-04-17 HISTORY — PX: CATARACT EXTRACTION W/PHACO: SHX586

## 2017-04-17 LAB — GLUCOSE, CAPILLARY
Glucose-Capillary: 145 mg/dL — ABNORMAL HIGH (ref 65–99)
Glucose-Capillary: 139 mg/dL — ABNORMAL HIGH (ref 65–99)

## 2017-04-17 SURGERY — PHACOEMULSIFICATION, CATARACT, WITH IOL INSERTION
Anesthesia: Monitor Anesthesia Care | Laterality: Left | Wound class: Clean

## 2017-04-17 MED ORDER — MIDAZOLAM HCL 2 MG/2ML IJ SOLN
INTRAMUSCULAR | Status: DC | PRN
  Administered 2017-04-17: 2 mg via INTRAVENOUS

## 2017-04-17 MED ORDER — LACTATED RINGERS IV SOLN: INTRAVENOUS | Status: DC

## 2017-04-17 MED ORDER — ARMC OPHTHALMIC DILATING DROPS
1.0000 "application " | OPHTHALMIC | Status: DC | PRN
  Administered 2017-04-17 (×3): 1 via OPHTHALMIC

## 2017-04-17 MED ORDER — NA HYALUR & NA CHOND-NA HYALUR 0.4-0.35 ML IO KIT
PACK | INTRAOCULAR | Status: DC | PRN
  Administered 2017-04-17: 1 mL via INTRAOCULAR

## 2017-04-17 MED ORDER — FENTANYL CITRATE (PF) 100 MCG/2ML IJ SOLN
INTRAMUSCULAR | Status: DC | PRN
  Administered 2017-04-17: 50 ug via INTRAVENOUS

## 2017-04-17 MED ORDER — MOXIFLOXACIN HCL 0.5 % OP SOLN
OPHTHALMIC | Status: DC | PRN
  Administered 2017-04-17: 0.2 mL via OPHTHALMIC

## 2017-04-17 MED ORDER — BSS IO SOLN
INTRAOCULAR | Status: DC | PRN
  Administered 2017-04-17: 82 mL via OPHTHALMIC

## 2017-04-17 MED ORDER — MOXIFLOXACIN HCL 0.5 % OP SOLN
1.0000 [drp] | OPHTHALMIC | Status: DC | PRN
  Administered 2017-04-17 (×3): 1 [drp] via OPHTHALMIC

## 2017-04-17 MED ORDER — BALANCED SALT IO SOLN
INTRAOCULAR | Status: DC | PRN
  Administered 2017-04-17: 1 mL via INTRAMUSCULAR

## 2017-04-17 SURGICAL SUPPLY — 26 items
CANNULA ANT/CHMB 27GA (MISCELLANEOUS) ×2 IMPLANT
CARTRIDGE ABBOTT (MISCELLANEOUS) IMPLANT
GLOVE SURG LX 7.5 STRW (GLOVE) ×1
GLOVE SURG LX STRL 7.5 STRW (GLOVE) ×1 IMPLANT
GLOVE SURG TRIUMPH 8.0 PF LTX (GLOVE) ×2 IMPLANT
GOWN STRL REUS W/ TWL LRG LVL3 (GOWN DISPOSABLE) ×2 IMPLANT
GOWN STRL REUS W/TWL LRG LVL3 (GOWN DISPOSABLE) ×2
LENS IOL ACRSF IQ ULTRA 14.5 (Intraocular Lens) ×1 IMPLANT
LENS IOL ACRYSOF IQ 14.5 (Intraocular Lens) ×2 IMPLANT
MARKER SKIN DUAL TIP RULER LAB (MISCELLANEOUS) ×2 IMPLANT
NDL RETROBULBAR .5 NSTRL (NEEDLE) IMPLANT
NEEDLE FILTER BLUNT 18X 1/2SAF (NEEDLE) ×1
NEEDLE FILTER BLUNT 18X1 1/2 (NEEDLE) ×1 IMPLANT
PACK CATARACT BRASINGTON (MISCELLANEOUS) ×2 IMPLANT
PACK EYE AFTER SURG (MISCELLANEOUS) ×2 IMPLANT
PACK OPTHALMIC (MISCELLANEOUS) ×2 IMPLANT
RING MALYGIN 7.0 (MISCELLANEOUS) IMPLANT
SUT ETHILON 10-0 CS-B-6CS-B-6 (SUTURE)
SUT VICRYL  9 0 (SUTURE)
SUT VICRYL 9 0 (SUTURE) IMPLANT
SUTURE EHLN 10-0 CS-B-6CS-B-6 (SUTURE) IMPLANT
SYR 3ML LL SCALE MARK (SYRINGE) ×2 IMPLANT
SYR 5ML LL (SYRINGE) ×2 IMPLANT
SYR TB 1ML LUER SLIP (SYRINGE) ×2 IMPLANT
WATER STERILE IRR 250ML POUR (IV SOLUTION) ×2 IMPLANT
WIPE NON LINTING 3.25X3.25 (MISCELLANEOUS) ×2 IMPLANT

## 2017-04-17 NOTE — Op Note (Signed)
OPERATIVE NOTE  Kim Ballard [id number] 04/17/2017   PREOPERATIVE DIAGNOSIS:  Nuclear sclerotic cataract left eye. H25.12   POSTOPERATIVE DIAGNOSIS:    Nuclear sclerotic cataract left eye.     PROCEDURE:  Phacoemusification with posterior chamber intraocular lens placement of the left eye   LENS:   Implant Name Type Inv. Item Serial No. Manufacturer Lot No. LRB No. Used  LENS IOL ACRYSOF IQ 14.5 - [id number] Intraocular Lens LENS IOL ACRYSOF IQ 14.5 [id number] ALCON  Left 1        ULTRASOUND TIME: 8  % of 1 minutes 9 seconds, CDE 5.9  SURGEON:  Wyonia Hough, MD   ANESTHESIA:  Topical with tetracaine drops and 2% Xylocaine jelly, augmented with 1% preservative-free intracameral lidocaine.    COMPLICATIONS:  None.   DESCRIPTION OF PROCEDURE:  The patient was identified in the holding room and transported to the operating room and placed in the supine position under the operating microscope.  The left eye was identified as the operative eye and it was prepped and draped in the usual sterile ophthalmic fashion.   A 1 millimeter clear-corneal paracentesis was made at the 1:30 position.  0.5 ml of preservative-free 1% lidocaine was injected into the anterior chamber.  The anterior chamber was filled with Viscoat viscoelastic.  A 2.4 millimeter keratome was used to make a near-clear corneal incision at the 10:30 position.  .  A curvilinear capsulorrhexis was made with a cystotome and capsulorrhexis forceps.  Balanced salt solution was used to hydrodissect and hydrodelineate the nucleus.   Phacoemulsification was then used in stop and chop fashion to remove the lens nucleus and epinucleus.  The remaining cortex was then removed using the irrigation and aspiration handpiece. Provisc was then placed into the capsular bag to distend it for lens placement.  A lens was then injected into the capsular bag.  The remaining viscoelastic was aspirated.   Wounds were hydrated with  balanced salt solution.  The anterior chamber was inflated to a physiologic pressure with balanced salt solution.  No wound leaks were noted. Vigamox 0.2 ml of a 1mg  per ml solution was injected into the anterior chamber for a dose of 0.2 mg of intracameral antibiotic at the completion of the case. The patient was taken to the recovery room in stable condition without complications of anesthesia or surgery.  Kim Ballard 04/17/2017, 8:07 AM

## 2017-04-17 NOTE — Anesthesia Procedure Notes (Signed)
Performed by: Chantay Whitelock, CRNA Pre-anesthesia Checklist: Patient identified, Emergency Drugs available, Suction available, Timeout performed and Patient being monitored Patient Re-evaluated:Patient Re-evaluated prior to induction Oxygen Delivery Method: Nasal cannula Placement Confirmation: positive ETCO2       

## 2017-04-17 NOTE — H&P (Signed)
The History and Physical notes are on paper, have been signed, and are to be scanned. The patient remains stable and unchanged from the H&P.   Previous H&P reviewed, patient examined, and there are no changes.  Kim Ballard 04/17/2017 7:38 AM

## 2017-04-17 NOTE — Transfer of Care (Signed)
Immediate Anesthesia Transfer of Care Note  Patient: Kim Ballard  Procedure(s) Performed: CATARACT EXTRACTION PHACO AND INTRAOCULAR LENS PLACEMENT (IOC) LEFT DIABETIC (Left )  Patient Location: PACU  Anesthesia Type: MAC  Level of Consciousness: awake, alert  and patient cooperative  Airway and Oxygen Therapy: Patient Spontanous Breathing and Patient connected to supplemental oxygen  Post-op Assessment: Post-op Vital signs reviewed, Patient's Cardiovascular Status Stable, Respiratory Function Stable, Patent Airway and No signs of Nausea or vomiting  Post-op Vital Signs: Reviewed and stable  Complications: No apparent anesthesia complications

## 2017-04-17 NOTE — Anesthesia Preprocedure Evaluation (Addendum)
Anesthesia Evaluation  Patient identified by MRN, date of birth, ID band  Reviewed: Allergy & Precautions, NPO status , Patient's Chart, lab work & pertinent test results, reviewed documented beta blocker date and time   Airway Mallampati: II  TM Distance: >3 FB Neck ROM: Full    Dental  (+) Upper Dentures, Lower Dentures   Pulmonary former smoker,    Pulmonary exam normal breath sounds clear to auscultation       Cardiovascular hypertension, Normal cardiovascular exam Rhythm:Regular Rate:Normal     Neuro/Psych Depression    GI/Hepatic GERD  ,  Endo/Other  diabetes  Renal/GU   negative genitourinary   Musculoskeletal  (+) Arthritis ,   Abdominal Normal abdominal exam  (+)   Peds  Hematology  (+) anemia ,   Anesthesia Other Findings   Reproductive/Obstetrics                            Anesthesia Physical Anesthesia Plan  ASA: II  Anesthesia Plan: MAC   Post-op Pain Management:    Induction: Intravenous  PONV Risk Score and Plan:   Airway Management Planned: Nasal Cannula  Additional Equipment: None  Intra-op Plan:   Post-operative Plan:   Informed Consent: I have reviewed the patients History and Physical, chart, labs and discussed the procedure including the risks, benefits and alternatives for the proposed anesthesia with the patient or authorized representative who has indicated his/her understanding and acceptance.     Plan Discussed with: CRNA, Anesthesiologist and Surgeon  Anesthesia Plan Comments:         Anesthesia Quick Evaluation

## 2017-04-17 NOTE — Anesthesia Postprocedure Evaluation (Signed)
Anesthesia Post Note  Patient: Kim Ballard  Procedure(s) Performed: CATARACT EXTRACTION PHACO AND INTRAOCULAR LENS PLACEMENT (IOC) LEFT DIABETIC (Left )  Patient location during evaluation: PACU Anesthesia Type: MAC Level of consciousness: awake Pain management: pain level controlled Vital Signs Assessment: post-procedure vital signs reviewed and stable Respiratory status: spontaneous breathing Cardiovascular status: blood pressure returned to baseline Postop Assessment: no headache Anesthetic complications: no    Lavonna Monarch

## 2017-04-18 ENCOUNTER — Encounter: Payer: Self-pay | Admitting: Ophthalmology

## 2017-06-01 DIAGNOSIS — E1142 Type 2 diabetes mellitus with diabetic polyneuropathy: Secondary | ICD-10-CM | POA: Diagnosis not present

## 2017-06-01 DIAGNOSIS — E538 Deficiency of other specified B group vitamins: Secondary | ICD-10-CM | POA: Diagnosis not present

## 2017-06-01 DIAGNOSIS — E11649 Type 2 diabetes mellitus with hypoglycemia without coma: Secondary | ICD-10-CM | POA: Diagnosis not present

## 2017-06-12 DIAGNOSIS — Z23 Encounter for immunization: Secondary | ICD-10-CM | POA: Diagnosis not present

## 2017-06-12 DIAGNOSIS — D61818 Other pancytopenia: Secondary | ICD-10-CM | POA: Diagnosis not present

## 2017-06-12 DIAGNOSIS — I10 Essential (primary) hypertension: Secondary | ICD-10-CM | POA: Diagnosis not present

## 2017-06-12 DIAGNOSIS — R1011 Right upper quadrant pain: Secondary | ICD-10-CM | POA: Diagnosis not present

## 2017-06-12 DIAGNOSIS — E119 Type 2 diabetes mellitus without complications: Secondary | ICD-10-CM | POA: Diagnosis not present

## 2017-06-12 DIAGNOSIS — R195 Other fecal abnormalities: Secondary | ICD-10-CM | POA: Diagnosis not present

## 2017-06-12 DIAGNOSIS — F3289 Other specified depressive episodes: Secondary | ICD-10-CM | POA: Diagnosis not present

## 2017-06-14 DIAGNOSIS — R195 Other fecal abnormalities: Secondary | ICD-10-CM | POA: Diagnosis not present

## 2017-06-15 ENCOUNTER — Encounter: Payer: Self-pay | Admitting: Hematology and Oncology

## 2017-06-15 ENCOUNTER — Inpatient Hospital Stay (HOSPITAL_BASED_OUTPATIENT_CLINIC_OR_DEPARTMENT_OTHER): Payer: Medicare HMO | Admitting: Hematology and Oncology

## 2017-06-15 ENCOUNTER — Inpatient Hospital Stay: Payer: Medicare HMO | Attending: Hematology and Oncology

## 2017-06-15 ENCOUNTER — Other Ambulatory Visit: Payer: Self-pay

## 2017-06-15 ENCOUNTER — Telehealth: Payer: Self-pay | Admitting: *Deleted

## 2017-06-15 VITALS — BP 156/74 | HR 70 | Temp 97.0°F | Resp 12 | Ht 67.5 in | Wt 155.7 lb

## 2017-06-15 DIAGNOSIS — D509 Iron deficiency anemia, unspecified: Secondary | ICD-10-CM

## 2017-06-15 DIAGNOSIS — D696 Thrombocytopenia, unspecified: Secondary | ICD-10-CM | POA: Insufficient documentation

## 2017-06-15 DIAGNOSIS — D500 Iron deficiency anemia secondary to blood loss (chronic): Secondary | ICD-10-CM

## 2017-06-15 DIAGNOSIS — D61818 Other pancytopenia: Secondary | ICD-10-CM

## 2017-06-15 LAB — IRON AND TIBC
Iron: 93 ug/dL (ref 28–170)
Saturation Ratios: 22 % (ref 10.4–31.8)
TIBC: 429 ug/dL (ref 250–450)
UIBC: 336 ug/dL

## 2017-06-15 LAB — CBC WITH DIFFERENTIAL/PLATELET
Basophils Absolute: 0 10*3/uL (ref 0–0.1)
Basophils Relative: 1 %
Eosinophils Absolute: 0.2 10*3/uL (ref 0–0.7)
Eosinophils Relative: 5 %
HCT: 35.2 % (ref 35.0–47.0)
Hemoglobin: 11.9 g/dL — ABNORMAL LOW (ref 12.0–16.0)
Lymphocytes Relative: 24 %
Lymphs Abs: 1.2 10*3/uL (ref 1.0–3.6)
MCH: 31.6 pg (ref 26.0–34.0)
MCHC: 33.9 g/dL (ref 32.0–36.0)
MCV: 93.2 fL (ref 80.0–100.0)
Monocytes Absolute: 0.4 10*3/uL (ref 0.2–0.9)
Monocytes Relative: 8 %
Neutro Abs: 3 10*3/uL (ref 1.4–6.5)
Neutrophils Relative %: 62 %
Platelets: 142 10*3/uL — ABNORMAL LOW (ref 150–440)
RBC: 3.78 MIL/uL — ABNORMAL LOW (ref 3.80–5.20)
RDW: 12.5 % (ref 11.5–14.5)
WBC: 4.8 10*3/uL (ref 3.6–11.0)

## 2017-06-15 LAB — FERRITIN: Ferritin: 92 ng/mL (ref 11–307)

## 2017-06-15 NOTE — Progress Notes (Signed)
See follow up. 

## 2017-06-15 NOTE — Progress Notes (Signed)
[facility] day:  06/15/2017   Chief Complaint: Kim Ballard is a 83 y.o. female with mild pancytopenia who is seen for 6 month assessment.  HPI:  The patient was last seen in medical oncology clinic on 12/15/2016.  At that time, she had some issues with a "rolling stomach".   Exam revealed no adenopathy or hepatosplenomegaly.  Hematocrit had improved to 33.2.  Platelets had improved to 119,000.  WBC was normal.  Ferritin was 128.  CBC on 03/23/2017 revealed a hematocrit of 34.6, hemoglobin 12.0, MCV 93.1, platelets 125,000, WBC 4700 with an ANC of 3000.  Ferritin was 94.  She underwent right (03/08/2017) and left (04/17/2017) cataract removal.  During the interim, patient has been having "spells" with her kidneys. She was having dysuria. She had an a refill of Macrobid at the pharmacy. She took the antibiotics and her symptoms improved. Patient has a medical history of diverticulosis. She notes that she was having GI symptoms. PCP sent stool studies. She notes that she ate a salad, which caused her stomach to "roll and keep on rolling".   Patient eating well and has maintained her weight.    Past Medical History:  Diagnosis Date  . Allergic state   . Anemia   . Arthritis   . Cancer ([place])    skin cancers  . Chronic cystitis with hematuria   . Depression   . Diabetes mellitus without complication ([name])   . Fibrocystic disease of both breasts   . Fibromyalgia   . GERD (gastroesophageal reflux disease)   . Hypertension   . IBS (irritable bowel syndrome)   . Nausea   . Skin cancer   . Wears dentures    full upper and lower    Past Surgical History:  Procedure Laterality Date  . ABDOMINAL HYSTERECTOMY    . APPENDECTOMY    . BREAST BIOPSY Right 2001   surgical bx   . CATARACT EXTRACTION W/PHACO Right 03/08/2017   Procedure: CATARACT EXTRACTION PHACO AND INTRAOCULAR LENS PLACEMENT ([place]) RIGHT DIABETIC TORIC;  Surgeon: Leandrew Koyanagi, MD;  Location: [facility];  Service: Ophthalmology;  Laterality: Right;  Diabetic - oral meds  . CATARACT EXTRACTION W/PHACO Left 04/17/2017   Procedure: CATARACT EXTRACTION PHACO AND INTRAOCULAR LENS PLACEMENT ([place]) LEFT DIABETIC;  Surgeon: Leandrew Koyanagi, MD;  Location: [facility];  Service: Ophthalmology;  Laterality: Left;  diabetic-oral meds  . CHOLECYSTECTOMY    . COLONOSCOPY    . COLONOSCOPY WITH PROPOFOL N/A 08/27/2015   Procedure: COLONOSCOPY WITH PROPOFOL;  Surgeon: Manya Silvas, MD;  Location: [facility] ENDOSCOPY;  Service: Endoscopy;  Laterality: N/A;  . COLONOSCOPY WITH PROPOFOL N/A 05/02/2016   Procedure: COLONOSCOPY WITH PROPOFOL;  Surgeon: Manya Silvas, MD;  Location: [facility] ENDOSCOPY;  Service: Endoscopy;  Laterality: N/A;  . ESOPHAGOGASTRODUODENOSCOPY    . ESOPHAGOGASTRODUODENOSCOPY (EGD) WITH PROPOFOL N/A 08/27/2015   Procedure: ESOPHAGOGASTRODUODENOSCOPY (EGD) WITH PROPOFOL;  Surgeon: Manya Silvas, MD;  Location: [facility] ENDOSCOPY;  Service: Endoscopy;  Laterality: N/A;  . HERNIA REPAIR      Family History  Problem Relation Age of Onset  . Hypertension Mother   . Ovarian cancer Paternal Grandmother   . COPD Father   . Hyperlipidemia Father   . Kidney disease Father   . Diabetes Maternal Grandmother     Social History:  reports that she quit smoking about 59 years ago. she has never used smokeless tobacco. She reports that she does not  drink alcohol or use drugs.  She lives in [place].  Her husband was bedridden for 3 years. He had Parkinson's and dementia.  He died in 10-26-2016.  She was his primary caregiver.  The patient is alone today.  Allergies:  Allergies  Allergen Reactions  . Biaxin [Clarithromycin] Other (See Comments)    GI UPSET   . Brompheniramine Maleate   . Ceclor [Cefaclor]   . Ciprofloxacin   . Clinoril [Sulindac]   . Codeine Sulfate   . Diovan [Valsartan]   . Doxycycline   . Erythromycin   . Esomeprazole    . Fluoxetine Other (See Comments)    UNKNOWN  . Guaifenesin & Derivatives   . Levaquin [Levofloxacin In D5w]   . Lisinopril   . Lodine [Etodolac]   . Macrodantin [Nitrofurantoin Macrocrystal]   . Medrol [Methylprednisolone]   . Mobic [Meloxicam]   . Motrin [Ibuprofen]   . Nexium [Esomeprazole Magnesium]   . Nitrofurantoin Other (See Comments)  . Norvasc [Amlodipine Besylate]   . Omnicef [Cefdinir]   . Penicillins   . Prozac [Fluoxetine Hcl] Other (See Comments)    Makes her feel crazy   . Pseudoephedrine   . Reglan [Metoclopramide]   . Seldane [Terfenadine]   . Sulfa Antibiotics Other (See Comments)  . Toprol Xl [Metoprolol Tartrate]   . Triamterene   . Tussionex Pennkinetic Er [Hydrocod Polst-Cpm Polst Er]   . Vibramycin [Doxycycline Calcium]     Current Medications: Current Outpatient Medications  Medication Sig Dispense Refill  . albuterol (PROVENTIL HFA;VENTOLIN HFA) 108 (90 Base) MCG/ACT inhaler Inhale 1-2 puffs into the lungs every 6 (six) hours as needed for wheezing or shortness of breath.    . beclomethasone (BECONASE-AQ) 42 MCG/SPRAY nasal spray Place 2 sprays into both nostrils 2 (two) times daily. Dose is for each nostril.    . clidinium-chlordiazePOXIDE (LIBRAX) 5-2.5 MG capsule Take 1 capsule by mouth.    Drusilla Kanner EXTRACT PO Take by mouth.    . fluticasone (FLONASE) 50 MCG/ACT nasal spray Place into both nostrils daily.    Marland Kitchen gemfibrozil (LOPID) 600 MG tablet Take 600 mg by mouth 2 (two) times daily before a meal.    . glipiZIDE (GLUCOTROL XL) 2.5 MG 24 hr tablet Take 2.5 mg by mouth daily with breakfast.    . hydrochlorothiazide (HYDRODIURIL) 25 MG tablet Take 25 mg by mouth daily.    . iron polysaccharides (NIFEREX) 150 MG capsule Take 150 mg by mouth daily.    . Lactobacillus Rhamnosus, GG, ([employer] PROBIOTIC, LACTOBACILLUS, PO) Take by mouth.    . metFORMIN (GLUCOPHAGE) 500 MG tablet Take 500 mg by mouth 2 (two) times daily with a meal.    . Multiple  Vitamins-Minerals (MULTIVITAMIN WITH MINERALS) tablet Take 1 tablet by mouth daily.    . OMEGA-3 FATTY ACIDS PO Take by mouth.    . pantoprazole (PROTONIX) 40 MG tablet Take 40 mg by mouth daily.    . sucralfate (CARAFATE) 1 g tablet Take 1 g by mouth 4 (four) times daily -  with meals and at bedtime.    . vitamin B-12 (CYANOCOBALAMIN) 1000 MCG tablet Take 1,000 mcg by mouth daily.    . vitamin E 100 UNIT capsule Take by mouth daily.     No current facility-administered medications for this visit.    Facility-Administered Medications Ordered in Other Visits  Medication Dose Route Frequency Provider Last Rate Last Dose  . iron sucrose (VENOFER) injection 200 mg  200 mg Intravenous Once Corcoran,  Drue Second, MD        Review of Systems:  GENERAL:  Feels "alright".  No fevers or sweats.  Weight up 1 pound. PERFORMANCE STATUS (ECOG):  1 HEENT:  Cataracts.  No visual changes, runny nose, sore throat, mouth sores or tenderness. Lungs: No shortness of breath or cough.  No hemoptysis. Cardiac:  No chest pain, palpitations, orthopnea, or PND. GI:  Diverticulosis.  "Rolling stomach".  No nausea, vomiting, melena or hematochezia. GU:  No urgency, frequency, dysuria, or hematuria. Musculoskeletal:  Fibromyalgia.  No back pain.  No joint pain.  No muscle tenderness. Extremities:  No pain or swelling. Skin:  Skin cancer frozen off; s/p 5FU cream.  No rashes or skin changes. Neuro:  No headache, numbness or weakness, balance or coordination issues. Endocrine:  Diabetes.  No thyroid issues, hot flashes or night sweats. Psych:  Lots of stress.  Poor sleep.  No mood changes, depression or anxiety. Pain:  No focal pain. Review of systems:  All other systems reviewed and found to be negative.  Physical Exam: Blood pressure (!) 156/74, pulse 70, temperature (!) 97 F (36.1 C), temperature source Tympanic, resp. rate 12, height 5' 7.5" (1.715 m), weight 155 lb 11.2 oz (70.6 kg). GENERAL:  Well developed,  well nourished, woman sitting comfortably in the exam room in no acute distress. MENTAL STATUS:  Alert and oriented to person, place and time. HEAD:  Short black hair.  Normocephalic, atraumatic, face symmetric, no Cushingoid features. EYES:  Glasses.  Blue eyes.  Pupils equal round and reactive to light and accomodation.  No conjunctivitis or scleral icterus. ENT:  Oropharynx clear without lesion.  Dentures.  Tongue normal. Mucous membranes moist.  RESPIRATORY:  Clear to auscultation without rales, wheezes or rhonchi. CARDIOVASCULAR:  Regular rate and rhythm without murmur, rub or gallop. ABDOMEN:  Soft, non-tender, with active bowel sounds, and no hepatosplenomegaly.  No guarding or rebound tenderness.  No masses. SKIN:  Pale.  No rashes, ulcers or lesions. EXTREMITIES: Arthritic changes.  No edema, no skin discoloration or tenderness.  No palpable cords. LYMPH NODES: No palpable cervical, supraclavicular, axillary or inguinal adenopathy  NEUROLOGICAL: Unremarkable. PSYCH:  Appropriate.   Appointment on 06/15/2017  Component Date Value Ref Range Status  . WBC 06/15/2017 4.8  3.6 - 11.0 K/uL Final  . RBC 06/15/2017 3.78* 3.80 - 5.20 MIL/uL Final  . Hemoglobin 06/15/2017 11.9* 12.0 - 16.0 g/dL Final  . HCT 06/15/2017 35.2  35.0 - 47.0 % Final  . MCV 06/15/2017 93.2  80.0 - 100.0 fL Final  . MCH 06/15/2017 31.6  26.0 - 34.0 pg Final  . MCHC 06/15/2017 33.9  32.0 - 36.0 g/dL Final  . RDW 06/15/2017 12.5  11.5 - 14.5 % Final  . Platelets 06/15/2017 142* 150 - 440 K/uL Final  . Neutrophils Relative % 06/15/2017 62  % Final  . Neutro Abs 06/15/2017 3.0  1.4 - 6.5 K/uL Final  . Lymphocytes Relative 06/15/2017 24  % Final  . Lymphs Abs 06/15/2017 1.2  1.0 - 3.6 K/uL Final  . Monocytes Relative 06/15/2017 8  % Final  . Monocytes Absolute 06/15/2017 0.4  0.2 - 0.9 K/uL Final  . Eosinophils Relative 06/15/2017 5  % Final  . Eosinophils Absolute 06/15/2017 0.2  0 - 0.7 K/uL Final  . Basophils  Relative 06/15/2017 1  % Final  . Basophils Absolute 06/15/2017 0.0  0 - 0.1 K/uL Final   Performed at [facility], [place],  [place] [address]    Assessment:  VIDHI DELELLIS is a 83 y.o. female with iron deficiency anemia.  She has had anemia since 12/24/2014.  Hematocrit previously ranged between 32.3 - 34. She was on oral iron. Iron causes constipation.    EGD on 08/27/2015 revealed LA grade A reflux esophagitis and erythematous mucosa in the gastric body and antrum.  Gastric biopsy revealed healing mucosal injury and focal intestinal metaplasia.   There was iron pill gastritis.    Colonoscopy on 08/27/2015 revealed 1 medium polyp in the cecum (tubulovillous adenoma).  Colonoscopy on 05/02/2016 revealed 2 diminutive polyps in the ascending colon and in the cecum.  Pathology revealed tubular adenoma negative for high grade dysplasia and malignancy.   Diet is fair.  She eats chicken or beef every other day. She eats green leafy vegetables. She denies any cravings.  Workup on 01/19/2016 revealed a hematocrit 29.9, hemoglobin 10.5, MCV 88.3, platelets 152,000, white count 4100 with an ANC of 2600. Reticulocyte count was 2.8%.  The following studies were normal:  ANA, rheumatoid factor, SPEP, free light chain ratio, [place], B12, folate.  Ferritin was 38 (low) . Iron studies included a saturation of 14% and TIBC of 462 (high) consistent with iron deficiency anemia.  She received Venofer 200 mg IV x 2 (03/25/2016 and 04/01/2016) and x 2 (09/30/2016 and 10/07/2016).  She has had mild thrombocytopenia possibly secondary to ITP.  Platelet count has ranged between 105,000 and 152,000 from 11/12/2013 to present without trend.  Abdominal and pelvic CT scan on 10/28/2015 revealed no hepatosplenomegaly.  Bilateral screening mammogram on 06/14/2016 revealed no evidence of malignancy.  Symptomatically, patient is doing well overall.  She was recently treated for a UTI.  Her stomach  has been "rolling". Stool studies pending with her PCP.  Exam reveals no adenopathy or hepatosplenomegaly.  Hematocrit has improved to 35.2.  Platelets have improved to 142,000.  WBC is normal.  Ferritin is 92.   Plan: 1.  Labs today:  CBC with diff, ferritin, iron studies. 2.  Call patient with ferritin (results available after clinic). 3.  RTC in 3 months for labs (CBC with diff, ferritin). 4.  RTC in 6 months for MD assessment and labs (CBC with diff, ferritin, iron studies).   Honor Loh, NP  06/15/2017, 2:56 PM   I saw and evaluated the patient, participating in the key portions of the service and reviewing pertinent diagnostic studies and records.  I reviewed the nurse practitioner's note and agree with the findings and the plan.  The assessment and plan were discussed with the patient.  A few questions were asked by the patient and answered.   Nolon Stalls, MD 06/15/2017,2:56 PM

## 2017-06-15 NOTE — Telephone Encounter (Signed)
-----   Message from Lequita Asal, MD sent at 06/15/2017  4:33 PM EST ----- Regarding: Please call patient with ferritin level   ----- Message ----- From: Interface, Lab In [place] Sent: 06/15/2017   2:18 PM To: Lequita Asal, MD

## 2017-06-15 NOTE — Telephone Encounter (Signed)
Called patient and reviews ferritin results.  Patient grateful for call.

## 2017-06-18 ENCOUNTER — Encounter: Payer: Self-pay | Admitting: Hematology and Oncology

## 2017-06-22 DIAGNOSIS — K219 Gastro-esophageal reflux disease without esophagitis: Secondary | ICD-10-CM | POA: Diagnosis not present

## 2017-06-22 DIAGNOSIS — I10 Essential (primary) hypertension: Secondary | ICD-10-CM | POA: Diagnosis not present

## 2017-06-22 DIAGNOSIS — Z0000 Encounter for general adult medical examination without abnormal findings: Secondary | ICD-10-CM | POA: Diagnosis not present

## 2017-06-22 DIAGNOSIS — F3289 Other specified depressive episodes: Secondary | ICD-10-CM | POA: Diagnosis not present

## 2017-06-22 DIAGNOSIS — D509 Iron deficiency anemia, unspecified: Secondary | ICD-10-CM | POA: Diagnosis not present

## 2017-06-22 DIAGNOSIS — E119 Type 2 diabetes mellitus without complications: Secondary | ICD-10-CM | POA: Diagnosis not present

## 2017-06-22 DIAGNOSIS — R300 Dysuria: Secondary | ICD-10-CM | POA: Diagnosis not present

## 2017-06-26 DIAGNOSIS — H43813 Vitreous degeneration, bilateral: Secondary | ICD-10-CM | POA: Diagnosis not present

## 2017-06-26 DIAGNOSIS — E119 Type 2 diabetes mellitus without complications: Secondary | ICD-10-CM | POA: Diagnosis not present

## 2017-06-26 DIAGNOSIS — Z961 Presence of intraocular lens: Secondary | ICD-10-CM | POA: Diagnosis not present

## 2017-06-26 DIAGNOSIS — H52222 Regular astigmatism, left eye: Secondary | ICD-10-CM | POA: Diagnosis not present

## 2017-06-26 DIAGNOSIS — H524 Presbyopia: Secondary | ICD-10-CM | POA: Diagnosis not present

## 2017-06-26 DIAGNOSIS — H2513 Age-related nuclear cataract, bilateral: Secondary | ICD-10-CM | POA: Diagnosis not present

## 2017-06-26 DIAGNOSIS — H1851 Endothelial corneal dystrophy: Secondary | ICD-10-CM | POA: Diagnosis not present

## 2017-06-26 DIAGNOSIS — H5202 Hypermetropia, left eye: Secondary | ICD-10-CM | POA: Diagnosis not present

## 2017-06-27 ENCOUNTER — Other Ambulatory Visit: Payer: Self-pay | Admitting: Internal Medicine

## 2017-06-27 DIAGNOSIS — Z1231 Encounter for screening mammogram for malignant neoplasm of breast: Secondary | ICD-10-CM

## 2017-06-29 DIAGNOSIS — E119 Type 2 diabetes mellitus without complications: Secondary | ICD-10-CM | POA: Diagnosis not present

## 2017-06-29 DIAGNOSIS — F3289 Other specified depressive episodes: Secondary | ICD-10-CM | POA: Diagnosis not present

## 2017-06-29 DIAGNOSIS — D696 Thrombocytopenia, unspecified: Secondary | ICD-10-CM | POA: Diagnosis not present

## 2017-06-29 DIAGNOSIS — K219 Gastro-esophageal reflux disease without esophagitis: Secondary | ICD-10-CM | POA: Diagnosis not present

## 2017-06-29 DIAGNOSIS — I10 Essential (primary) hypertension: Secondary | ICD-10-CM | POA: Diagnosis not present

## 2017-06-29 DIAGNOSIS — D61818 Other pancytopenia: Secondary | ICD-10-CM | POA: Diagnosis not present

## 2017-07-06 DIAGNOSIS — M461 Sacroiliitis, not elsewhere classified: Secondary | ICD-10-CM | POA: Diagnosis not present

## 2017-07-06 DIAGNOSIS — M9903 Segmental and somatic dysfunction of lumbar region: Secondary | ICD-10-CM | POA: Diagnosis not present

## 2017-07-06 DIAGNOSIS — M9901 Segmental and somatic dysfunction of cervical region: Secondary | ICD-10-CM | POA: Diagnosis not present

## 2017-07-06 DIAGNOSIS — M542 Cervicalgia: Secondary | ICD-10-CM | POA: Diagnosis not present

## 2017-07-06 DIAGNOSIS — M9904 Segmental and somatic dysfunction of sacral region: Secondary | ICD-10-CM | POA: Diagnosis not present

## 2017-07-06 DIAGNOSIS — M545 Low back pain: Secondary | ICD-10-CM | POA: Diagnosis not present

## 2017-07-10 DIAGNOSIS — M9901 Segmental and somatic dysfunction of cervical region: Secondary | ICD-10-CM | POA: Diagnosis not present

## 2017-07-10 DIAGNOSIS — M545 Low back pain: Secondary | ICD-10-CM | POA: Diagnosis not present

## 2017-07-10 DIAGNOSIS — M9904 Segmental and somatic dysfunction of sacral region: Secondary | ICD-10-CM | POA: Diagnosis not present

## 2017-07-10 DIAGNOSIS — M9903 Segmental and somatic dysfunction of lumbar region: Secondary | ICD-10-CM | POA: Diagnosis not present

## 2017-07-10 DIAGNOSIS — M461 Sacroiliitis, not elsewhere classified: Secondary | ICD-10-CM | POA: Diagnosis not present

## 2017-07-10 DIAGNOSIS — M542 Cervicalgia: Secondary | ICD-10-CM | POA: Diagnosis not present

## 2017-07-11 DIAGNOSIS — M542 Cervicalgia: Secondary | ICD-10-CM | POA: Diagnosis not present

## 2017-07-11 DIAGNOSIS — M461 Sacroiliitis, not elsewhere classified: Secondary | ICD-10-CM | POA: Diagnosis not present

## 2017-07-11 DIAGNOSIS — M9903 Segmental and somatic dysfunction of lumbar region: Secondary | ICD-10-CM | POA: Diagnosis not present

## 2017-07-11 DIAGNOSIS — M545 Low back pain: Secondary | ICD-10-CM | POA: Diagnosis not present

## 2017-07-11 DIAGNOSIS — M9901 Segmental and somatic dysfunction of cervical region: Secondary | ICD-10-CM | POA: Diagnosis not present

## 2017-07-11 DIAGNOSIS — M9904 Segmental and somatic dysfunction of sacral region: Secondary | ICD-10-CM | POA: Diagnosis not present

## 2017-07-17 ENCOUNTER — Ambulatory Visit
Admission: RE | Admit: 2017-07-17 | Discharge: 2017-07-17 | Disposition: A | Discharge status: Home / Self Care | Payer: Medicare HMO | Source: Ambulatory Visit | Attending: Internal Medicine | Admitting: Internal Medicine

## 2017-07-17 DIAGNOSIS — Z1231 Encounter for screening mammogram for malignant neoplasm of breast: Secondary | ICD-10-CM | POA: Insufficient documentation

## 2017-09-06 DIAGNOSIS — L538 Other specified erythematous conditions: Secondary | ICD-10-CM | POA: Diagnosis not present

## 2017-09-06 DIAGNOSIS — L820 Inflamed seborrheic keratosis: Secondary | ICD-10-CM | POA: Diagnosis not present

## 2017-09-06 DIAGNOSIS — L658 Other specified nonscarring hair loss: Secondary | ICD-10-CM | POA: Diagnosis not present

## 2017-09-06 DIAGNOSIS — L821 Other seborrheic keratosis: Secondary | ICD-10-CM | POA: Diagnosis not present

## 2017-09-08 DIAGNOSIS — N343 Urethral syndrome, unspecified: Secondary | ICD-10-CM | POA: Diagnosis not present

## 2017-09-08 DIAGNOSIS — D510 Vitamin B12 deficiency anemia due to intrinsic factor deficiency: Secondary | ICD-10-CM | POA: Diagnosis not present

## 2017-09-08 DIAGNOSIS — E119 Type 2 diabetes mellitus without complications: Secondary | ICD-10-CM | POA: Diagnosis not present

## 2017-09-08 DIAGNOSIS — D61818 Other pancytopenia: Secondary | ICD-10-CM | POA: Diagnosis not present

## 2017-09-08 DIAGNOSIS — R1032 Left lower quadrant pain: Secondary | ICD-10-CM | POA: Diagnosis not present

## 2017-09-08 DIAGNOSIS — D649 Anemia, unspecified: Secondary | ICD-10-CM | POA: Diagnosis not present

## 2017-09-08 DIAGNOSIS — I10 Essential (primary) hypertension: Secondary | ICD-10-CM | POA: Diagnosis not present

## 2017-09-14 ENCOUNTER — Inpatient Hospital Stay (HOSPITAL_BASED_OUTPATIENT_CLINIC_OR_DEPARTMENT_OTHER): Payer: Medicare HMO | Admitting: Hematology and Oncology

## 2017-09-14 ENCOUNTER — Inpatient Hospital Stay: Payer: Medicare HMO | Attending: Hematology and Oncology

## 2017-09-14 ENCOUNTER — Encounter: Payer: Self-pay | Admitting: Hematology and Oncology

## 2017-09-14 ENCOUNTER — Other Ambulatory Visit: Payer: Self-pay

## 2017-09-14 VITALS — BP 155/86 | HR 78 | Resp 18 | Ht 67.5 in | Wt 154.2 lb

## 2017-09-14 DIAGNOSIS — D696 Thrombocytopenia, unspecified: Secondary | ICD-10-CM

## 2017-09-14 DIAGNOSIS — D509 Iron deficiency anemia, unspecified: Secondary | ICD-10-CM | POA: Insufficient documentation

## 2017-09-14 DIAGNOSIS — R5383 Other fatigue: Secondary | ICD-10-CM

## 2017-09-14 DIAGNOSIS — D61818 Other pancytopenia: Secondary | ICD-10-CM

## 2017-09-14 LAB — CBC WITH DIFFERENTIAL/PLATELET
Basophils Absolute: 0 10*3/uL (ref 0–0.1)
Basophils Relative: 1 %
Eosinophils Absolute: 0.2 10*3/uL (ref 0–0.7)
Eosinophils Relative: 3 %
HCT: 32.8 % — ABNORMAL LOW (ref 35.0–47.0)
Hemoglobin: 11.5 g/dL — ABNORMAL LOW (ref 12.0–16.0)
Lymphocytes Relative: 16 %
Lymphs Abs: 1.2 10*3/uL (ref 1.0–3.6)
MCH: 31.9 pg (ref 26.0–34.0)
MCHC: 35.1 g/dL (ref 32.0–36.0)
MCV: 90.9 fL (ref 80.0–100.0)
Monocytes Absolute: 0.6 10*3/uL (ref 0.2–0.9)
Monocytes Relative: 8 %
Neutro Abs: 5.5 10*3/uL (ref 1.4–6.5)
Neutrophils Relative %: 72 %
Platelets: 155 10*3/uL (ref 150–440)
RBC: 3.61 MIL/uL — ABNORMAL LOW (ref 3.80–5.20)
RDW: 12.4 % (ref 11.5–14.5)
WBC: 7.5 10*3/uL (ref 3.6–11.0)

## 2017-09-14 LAB — FERRITIN: Ferritin: 90 ng/mL (ref 11–307)

## 2017-09-14 NOTE — Progress Notes (Signed)
Patient states she has been feeling weak and lightheaded for a few weeks. Patient saw Dr. Ginette Pitman about stomach problems last week and was put on flagyl and levaquin, but stopped taking medicaiton 2 weeks ago because they made her sick. Also went to see dermatologist and froze some areas in abdomen.

## 2017-09-14 NOTE — Progress Notes (Signed)
[facility] day:  09/14/2017   Chief Complaint: Kim Ballard is a 83 y.o. female with mild pancytopenia who is seen for sick call visit.  HPI:  The patient was last seen in medical oncology clinic on 06/15/2017.  At that time, she was doing well overall.  She had been recently treated for a UTI.  Her stomach was "rolling". Stool studies were pending with her PCP.  Exam revealed no adenopathy or hepatosplenomegaly.  Hematocrit had improved to 35.2.  Platelets had improved to 142,000.  WBC was normal.  Ferritin was 92.   She returned today for counts.  CBC revealed a hematocrit of 32.8, hemoglobin 11.5, MCV 90.9, platelets 155,000, WBC 7500 with an ANC of 5500.  Ferritin is pending.  During the interim, she states that she "stays weak all of the time".  Symptoms have been worse over the past month.  She has been lightheaded.  She denies any issues with her blood pressure or blood sugar.  She describes a "spell" with her stomach.  She was seen by Dr. Ginette Pitman on 09/08/2017.  She has had diverticulitis.  She was treated with Flagyl and Levaquin.  Labs at that time included a hematocrit of 33, hemoglobin 11, MCV 94.6, platelets 135,000, WBC 5800.  Blood sugar was 219.  B12 was > 1500.  TSH was 1.047.  Urinalysis was negative.  She denies any melena or hematochezia.   Past Medical History:  Diagnosis Date  . Allergic state   . Anemia   . Arthritis   . Cancer ([place])    skin cancers  . Chronic cystitis with hematuria   . Depression   . Diabetes mellitus without complication ([place])   . Fibrocystic disease of both breasts   . Fibromyalgia   . GERD (gastroesophageal reflux disease)   . Hypertension   . IBS (irritable bowel syndrome)   . Nausea   . Skin cancer   . Wears dentures    full upper and lower    Past Surgical History:  Procedure Laterality Date  . ABDOMINAL HYSTERECTOMY    . APPENDECTOMY    . BREAST BIOPSY Right 2001   surgical bx   .  CATARACT EXTRACTION W/PHACO Right 03/08/2017   Procedure: CATARACT EXTRACTION PHACO AND INTRAOCULAR LENS PLACEMENT ([place]) RIGHT DIABETIC TORIC;  Surgeon: Leandrew Koyanagi, MD;  Location: [facility];  Service: Ophthalmology;  Laterality: Right;  Diabetic - oral meds  . CATARACT EXTRACTION W/PHACO Left 04/17/2017   Procedure: CATARACT EXTRACTION PHACO AND INTRAOCULAR LENS PLACEMENT ([place]) LEFT DIABETIC;  Surgeon: Leandrew Koyanagi, MD;  Location: [facility];  Service: Ophthalmology;  Laterality: Left;  diabetic-oral meds  . CHOLECYSTECTOMY    . COLONOSCOPY    . COLONOSCOPY WITH PROPOFOL N/A 08/27/2015   Procedure: COLONOSCOPY WITH PROPOFOL;  Surgeon: Manya Silvas, MD;  Location[facility] ENDOSCOPY;  Service: Endoscopy;  Laterality: N/A;  . COLONOSCOPY WITH PROPOFOL N/A 05/02/2016   Procedure: COLONOSCOPY WITH PROPOFOL;  Surgeon: Manya Silvas, MD;  Location: [facility] ENDOSCOPY;  Service: Endoscopy;  Laterality: N/A;  . ESOPHAGOGASTRODUODENOSCOPY    . ESOPHAGOGASTRODUODENOSCOPY (EGD) WITH PROPOFOL N/A 08/27/2015   Procedure: ESOPHAGOGASTRODUODENOSCOPY (EGD) WITH PROPOFOL;  Surgeon: Manya Silvas, MD;  Location[facility] ENDOSCOPY;  Service: Endoscopy;  Laterality: N/A;  . HERNIA REPAIR      Family History  Problem Relation Age of Onset  . Hypertension Mother   . Ovarian cancer Paternal Grandmother   . COPD Father   .  Hyperlipidemia Father   . Kidney disease Father   . Diabetes Maternal Grandmother     Social History:  reports that she quit smoking about 59 years ago. She has never used smokeless tobacco. She reports that she does not drink alcohol or use drugs.  She lives in [place].  Her husband was bedridden for 3 years. He had Parkinson's and dementia.  He died in Oct 24, 2016.  She was his primary caregiver.  The patient is alone today.  Allergies:  Allergies  Allergen Reactions  . Biaxin [Clarithromycin] Other (See Comments)    GI UPSET   . Brompheniramine Maleate    . Ceclor [Cefaclor]   . Ciprofloxacin   . Clinoril [Sulindac]   . Codeine Sulfate   . Diovan [Valsartan]   . Doxycycline   . Erythromycin   . Esomeprazole   . Fluoxetine Other (See Comments)    UNKNOWN  . Guaifenesin & Derivatives   . Levaquin [Levofloxacin In D5w]   . Lisinopril   . Lodine [Etodolac]   . Macrodantin [Nitrofurantoin Macrocrystal]   . Medrol [Methylprednisolone]   . Mobic [Meloxicam]   . Motrin [Ibuprofen]   . Nexium [Esomeprazole Magnesium]   . Nitrofurantoin Other (See Comments)  . Norvasc [Amlodipine Besylate]   . Omnicef [Cefdinir]   . Penicillins   . Prozac [Fluoxetine Hcl] Other (See Comments)    Makes her feel crazy   . Pseudoephedrine   . Reglan [Metoclopramide]   . Seldane [Terfenadine]   . Sulfa Antibiotics Other (See Comments)  . Toprol Xl [Metoprolol Tartrate]   . Triamterene   . Tussionex Pennkinetic Er [Hydrocod Polst-Cpm Polst Er]   . Vibramycin [Doxycycline Calcium]     Current Medications: Current Outpatient Medications  Medication Sig Dispense Refill  . albuterol (PROVENTIL HFA;VENTOLIN HFA) 108 (90 Base) MCG/ACT inhaler Inhale 1-2 puffs into the lungs every 6 (six) hours as needed for wheezing or shortness of breath.    . beclomethasone (BECONASE-AQ) 42 MCG/SPRAY nasal spray Place 2 sprays into both nostrils 2 (two) times daily. Dose is for each nostril.    . clidinium-chlordiazePOXIDE (LIBRAX) 5-2.5 MG capsule Take 1 capsule by mouth.    Drusilla Kanner EXTRACT PO Take by mouth.    . fluticasone (FLONASE) 50 MCG/ACT nasal spray Place into both nostrils daily.    Marland Kitchen gemfibrozil (LOPID) 600 MG tablet Take 600 mg by mouth 2 (two) times daily before a meal.    . glipiZIDE (GLUCOTROL XL) 2.5 MG 24 hr tablet Take 2.5 mg by mouth daily with breakfast.    . hydrochlorothiazide (HYDRODIURIL) 25 MG tablet Take 25 mg by mouth daily.    . iron polysaccharides (NIFEREX) 150 MG capsule Take 150 mg by mouth daily.    . Lactobacillus Rhamnosus, GG,  ([employer] PROBIOTIC, LACTOBACILLUS, PO) Take by mouth.    . metFORMIN (GLUCOPHAGE) 500 MG tablet Take 500 mg by mouth 2 (two) times daily with a meal.    . Multiple Vitamins-Minerals (MULTIVITAMIN WITH MINERALS) tablet Take 1 tablet by mouth daily.    . OMEGA-3 FATTY ACIDS PO Take by mouth.    . pantoprazole (PROTONIX) 40 MG tablet Take 40 mg by mouth daily.    . sucralfate (CARAFATE) 1 g tablet Take 1 g by mouth 4 (four) times daily -  with meals and at bedtime.    . vitamin B-12 (CYANOCOBALAMIN) 1000 MCG tablet Take 1,000 mcg by mouth daily.    . vitamin E 100 UNIT capsule Take by mouth daily.  No current facility-administered medications for this visit.    Facility-Administered Medications Ordered in Other Visits  Medication Dose Route Frequency Provider Last Rate Last Dose  . iron sucrose (VENOFER) injection 200 mg  200 mg Intravenous Once Lequita Asal, MD        Review of Systems:  GENERAL:  Feels "weak".  No fevers or sweats.  Weight down 1 pound. PERFORMANCE STATUS (ECOG):  1 HEENT:  Cataracts.  No visual changes, runny nose, sore throat, mouth sores or tenderness. Lungs: No shortness of breath or cough.  No hemoptysis. Cardiac:  No chest pain, palpitations, orthopnea, or PND. GI:  Interval diverticulitis (see HPI).  No nausea, vomiting, melena or hematochezia. GU:  No urgency, frequency, dysuria, or hematuria. Musculoskeletal:  Fibromyalgia.  No back pain.  No joint pain.  No muscle tenderness. Extremities:  No pain or swelling. Skin:  Skin cancer frozen off; s/p 5FU cream.  No rashes or skin changes. Neuro:  No headache, numbness or weakness, balance or coordination issues. Endocrine:  Diabetes.  No thyroid issues, hot flashes or night sweats. Psych:  Lots of stress.  Poor sleep.  No mood changes, depression or anxiety. Pain:  No focal pain. Review of systems:  All other systems reviewed and found to be negative.  Physical Exam: Blood pressure (!) 155/86, pulse 78,  resp. rate 18, height 5' 7.5" (1.715 m), weight 154 lb 4 oz (70 kg), SpO2 100 %. GENERAL:  Well developed, well nourished, woman sitting comfortably in the exam room in no acute distress.  She appears younger than stated age. MENTAL STATUS:  Alert and oriented to person, place and time. HEAD:  Short black hair.  Normocephalic, atraumatic, face symmetric, no Cushingoid features. EYES:  Glasses.  Blue eyes.  Pupils equal round and reactive to light and accomodation.  No conjunctivitis or scleral icterus. ENT:  Oropharynx clear without lesion.  Dentures.  Tongue normal. Mucous membranes moist.  RESPIRATORY:  Clear to auscultation without rales, wheezes or rhonchi. CARDIOVASCULAR:  Regular rate and rhythm without murmur, rub or gallop. ABDOMEN:  Soft, non-tender, with active bowel sounds, and no hepatosplenomegaly.  No guarding or rebound tenderness.  No masses. SKIN:  Pale.  No rashes, ulcers or lesions. EXTREMITIES: Arthritic changes.  No edema, no skin discoloration or tenderness.  No palpable cords. LYMPH NODES: No palpable cervical, supraclavicular, axillary or inguinal adenopathy  NEUROLOGICAL: Unremarkable. PSYCH:  Appropriate.   Orders Only on 09/14/2017  Component Date Value Ref Range Status  . Ferritin 09/14/2017 90  11 - 307 ng/mL Final   Performed at [facility], [address] [place], [place] [address]  . WBC 09/14/2017 7.5  3.6 - 11.0 K/uL Final  . RBC 09/14/2017 3.61* 3.80 - 5.20 MIL/uL Final  . Hemoglobin 09/14/2017 11.5* 12.0 - 16.0 g/dL Final  . HCT 09/14/2017 32.8* 35.0 - 47.0 % Final  . MCV 09/14/2017 90.9  80.0 - 100.0 fL Final  . MCH 09/14/2017 31.9  26.0 - 34.0 pg Final  . MCHC 09/14/2017 35.1  32.0 - 36.0 g/dL Final  . RDW 09/14/2017 12.4  11.5 - 14.5 % Final  . Platelets 09/14/2017 155  150 - 440 K/uL Final  . Neutrophils Relative % 09/14/2017 72  % Final  . Neutro Abs 09/14/2017 5.5  1.4 - 6.5 K/uL Final  . Lymphocytes Relative 09/14/2017 16  % Final   . Lymphs Abs 09/14/2017 1.2  1.0 - 3.6 K/uL Final  . Monocytes Relative 09/14/2017 8  %  Final  . Monocytes Absolute 09/14/2017 0.6  0.2 - 0.9 K/uL Final  . Eosinophils Relative 09/14/2017 3  % Final  . Eosinophils Absolute 09/14/2017 0.2  0 - 0.7 K/uL Final  . Basophils Relative 09/14/2017 1  % Final  . Basophils Absolute 09/14/2017 0.0  0 - 0.1 K/uL Final   Performed at [facility], [address] [place], [place] [address]    Assessment:  SCHERRY LAVERNE is a 83 y.o. female with iron deficiency anemia.  She has had anemia since 12/24/2014.  Hematocrit previously ranged between 32.3 - 34. She was on oral iron. Iron causes constipation.    EGD on 08/27/2015 revealed LA grade A reflux esophagitis and erythematous mucosa in the gastric body and antrum.  Gastric biopsy revealed healing mucosal injury and focal intestinal metaplasia.   There was iron pill gastritis.    Colonoscopy on 08/27/2015 revealed 1 medium polyp in the cecum (tubulovillous adenoma).  Colonoscopy on 05/02/2016 revealed 2 diminutive polyps in the ascending colon and in the cecum.  Pathology revealed tubular adenoma negative for high grade dysplasia and malignancy.   Diet is fair.  She eats chicken or beef every other day. She eats green leafy vegetables. She denies any cravings.  Workup on 01/19/2016 revealed a hematocrit 29.9, hemoglobin 10.5, MCV 88.3, platelets 152,000, white count 4100 with an ANC of 2600. Reticulocyte count was 2.8%.  The following studies were normal:  ANA, rheumatoid factor, SPEP, free light chain ratio, [place], B12, folate.  Ferritin was 38 (low) . Iron studies included a saturation of 14% and TIBC of 462 (high) consistent with iron deficiency anemia.  She received Venofer 200 mg IV x 2 (03/25/2016 and 04/01/2016) and x 2 (09/30/2016 and 10/07/2016).  Ferritin has been followed: 36 on 09/21/2016, 128 on 12/15/2016, 94 on 03/23/2017, 92 on 06/15/2017, and 90 on 09/14/2017.  She has had mild  thrombocytopenia possibly secondary to ITP.  Platelet count has ranged between 105,000 and 152,000 from 11/12/2013 to present without trend.  Abdominal and pelvic CT scan on 10/28/2015 revealed no hepatosplenomegaly.  Bilateral screening mammogram on 06/14/2016 revealed no evidence of malignancy.  Symptomatically, she feels weak.  She has had an interval episode of diverticulitis.  Exam is stable.  Hematocrit is 32.8 with a hemoglobin 11.5.  Platelets are normal at 155,000.  WBC is normal.  Ferritin is 90.   Plan: 1.  Review today's labs.  Hematocrit is 32.8.  WBC and platelet count are normal.  Ferritin is adequate. 2.  Reassurance based on today's CBC. 3.  Discuss follow-up with Dr. Ginette Pitman. 4.  RTC as previously scheduled.   Lequita Asal, MD  09/14/2017, 4:38 PM

## 2017-09-18 DIAGNOSIS — R309 Painful micturition, unspecified: Secondary | ICD-10-CM | POA: Diagnosis not present

## 2017-09-18 DIAGNOSIS — R5383 Other fatigue: Secondary | ICD-10-CM | POA: Insufficient documentation

## 2017-09-19 DIAGNOSIS — R140 Abdominal distension (gaseous): Secondary | ICD-10-CM | POA: Diagnosis not present

## 2017-09-19 DIAGNOSIS — R195 Other fecal abnormalities: Secondary | ICD-10-CM | POA: Diagnosis not present

## 2017-09-19 DIAGNOSIS — R8290 Unspecified abnormal findings in urine: Secondary | ICD-10-CM | POA: Diagnosis not present

## 2017-09-19 DIAGNOSIS — D649 Anemia, unspecified: Secondary | ICD-10-CM | POA: Diagnosis not present

## 2017-09-19 DIAGNOSIS — R300 Dysuria: Secondary | ICD-10-CM | POA: Diagnosis not present

## 2017-09-26 DIAGNOSIS — Z880 Allergy status to penicillin: Secondary | ICD-10-CM | POA: Diagnosis not present

## 2017-09-26 DIAGNOSIS — Z87891 Personal history of nicotine dependence: Secondary | ICD-10-CM | POA: Diagnosis not present

## 2017-09-26 DIAGNOSIS — R300 Dysuria: Secondary | ICD-10-CM | POA: Diagnosis not present

## 2017-09-26 DIAGNOSIS — N3941 Urge incontinence: Secondary | ICD-10-CM | POA: Diagnosis not present

## 2017-09-26 DIAGNOSIS — Z882 Allergy status to sulfonamides status: Secondary | ICD-10-CM | POA: Diagnosis not present

## 2017-09-26 DIAGNOSIS — N3020 Other chronic cystitis without hematuria: Secondary | ICD-10-CM | POA: Diagnosis not present

## 2017-09-26 DIAGNOSIS — E119 Type 2 diabetes mellitus without complications: Secondary | ICD-10-CM | POA: Diagnosis not present

## 2017-09-26 DIAGNOSIS — I10 Essential (primary) hypertension: Secondary | ICD-10-CM | POA: Diagnosis not present

## 2017-09-26 DIAGNOSIS — R339 Retention of urine, unspecified: Secondary | ICD-10-CM | POA: Diagnosis not present

## 2017-10-06 DIAGNOSIS — R300 Dysuria: Secondary | ICD-10-CM | POA: Diagnosis not present

## 2017-10-19 DIAGNOSIS — E119 Type 2 diabetes mellitus without complications: Secondary | ICD-10-CM | POA: Diagnosis not present

## 2017-10-19 DIAGNOSIS — D696 Thrombocytopenia, unspecified: Secondary | ICD-10-CM | POA: Diagnosis not present

## 2017-10-19 DIAGNOSIS — K219 Gastro-esophageal reflux disease without esophagitis: Secondary | ICD-10-CM | POA: Diagnosis not present

## 2017-10-19 DIAGNOSIS — R8290 Unspecified abnormal findings in urine: Secondary | ICD-10-CM | POA: Diagnosis not present

## 2017-10-19 DIAGNOSIS — I10 Essential (primary) hypertension: Secondary | ICD-10-CM | POA: Diagnosis not present

## 2017-10-19 DIAGNOSIS — D61818 Other pancytopenia: Secondary | ICD-10-CM | POA: Diagnosis not present

## 2017-10-19 DIAGNOSIS — F3289 Other specified depressive episodes: Secondary | ICD-10-CM | POA: Diagnosis not present

## 2017-10-23 DIAGNOSIS — M542 Cervicalgia: Secondary | ICD-10-CM | POA: Diagnosis not present

## 2017-10-23 DIAGNOSIS — M9901 Segmental and somatic dysfunction of cervical region: Secondary | ICD-10-CM | POA: Diagnosis not present

## 2017-10-23 DIAGNOSIS — M5411 Radiculopathy, occipito-atlanto-axial region: Secondary | ICD-10-CM | POA: Diagnosis not present

## 2017-10-24 DIAGNOSIS — M542 Cervicalgia: Secondary | ICD-10-CM | POA: Diagnosis not present

## 2017-10-24 DIAGNOSIS — M9901 Segmental and somatic dysfunction of cervical region: Secondary | ICD-10-CM | POA: Diagnosis not present

## 2017-10-24 DIAGNOSIS — M5411 Radiculopathy, occipito-atlanto-axial region: Secondary | ICD-10-CM | POA: Diagnosis not present

## 2017-10-25 DIAGNOSIS — N3020 Other chronic cystitis without hematuria: Secondary | ICD-10-CM | POA: Diagnosis not present

## 2017-10-26 DIAGNOSIS — I10 Essential (primary) hypertension: Secondary | ICD-10-CM | POA: Diagnosis not present

## 2017-10-26 DIAGNOSIS — M5411 Radiculopathy, occipito-atlanto-axial region: Secondary | ICD-10-CM | POA: Diagnosis not present

## 2017-10-26 DIAGNOSIS — Z0000 Encounter for general adult medical examination without abnormal findings: Secondary | ICD-10-CM | POA: Diagnosis not present

## 2017-10-26 DIAGNOSIS — M9901 Segmental and somatic dysfunction of cervical region: Secondary | ICD-10-CM | POA: Diagnosis not present

## 2017-10-26 DIAGNOSIS — D696 Thrombocytopenia, unspecified: Secondary | ICD-10-CM | POA: Diagnosis not present

## 2017-10-26 DIAGNOSIS — K580 Irritable bowel syndrome with diarrhea: Secondary | ICD-10-CM | POA: Diagnosis not present

## 2017-10-26 DIAGNOSIS — Z87440 Personal history of urinary (tract) infections: Secondary | ICD-10-CM | POA: Diagnosis not present

## 2017-10-26 DIAGNOSIS — M542 Cervicalgia: Secondary | ICD-10-CM | POA: Diagnosis not present

## 2017-10-26 DIAGNOSIS — D61818 Other pancytopenia: Secondary | ICD-10-CM | POA: Diagnosis not present

## 2017-10-26 DIAGNOSIS — E119 Type 2 diabetes mellitus without complications: Secondary | ICD-10-CM | POA: Diagnosis not present

## 2017-11-01 DIAGNOSIS — M5411 Radiculopathy, occipito-atlanto-axial region: Secondary | ICD-10-CM | POA: Diagnosis not present

## 2017-11-01 DIAGNOSIS — M542 Cervicalgia: Secondary | ICD-10-CM | POA: Diagnosis not present

## 2017-11-01 DIAGNOSIS — M9901 Segmental and somatic dysfunction of cervical region: Secondary | ICD-10-CM | POA: Diagnosis not present

## 2017-11-09 DIAGNOSIS — E1142 Type 2 diabetes mellitus with diabetic polyneuropathy: Secondary | ICD-10-CM | POA: Diagnosis not present

## 2017-11-09 DIAGNOSIS — L851 Acquired keratosis [keratoderma] palmaris et plantaris: Secondary | ICD-10-CM | POA: Diagnosis not present

## 2017-12-05 ENCOUNTER — Inpatient Hospital Stay (HOSPITAL_BASED_OUTPATIENT_CLINIC_OR_DEPARTMENT_OTHER): Payer: Medicare HMO | Admitting: Hematology and Oncology

## 2017-12-05 ENCOUNTER — Inpatient Hospital Stay: Payer: Medicare HMO | Attending: Hematology and Oncology

## 2017-12-05 ENCOUNTER — Encounter: Payer: Self-pay | Admitting: Hematology and Oncology

## 2017-12-05 VITALS — BP 125/74 | HR 62 | Temp 95.8°F | Wt 150.2 lb

## 2017-12-05 DIAGNOSIS — M797 Fibromyalgia: Secondary | ICD-10-CM | POA: Insufficient documentation

## 2017-12-05 DIAGNOSIS — D696 Thrombocytopenia, unspecified: Secondary | ICD-10-CM

## 2017-12-05 DIAGNOSIS — R5383 Other fatigue: Secondary | ICD-10-CM | POA: Insufficient documentation

## 2017-12-05 DIAGNOSIS — G8929 Other chronic pain: Secondary | ICD-10-CM

## 2017-12-05 DIAGNOSIS — D509 Iron deficiency anemia, unspecified: Secondary | ICD-10-CM

## 2017-12-05 DIAGNOSIS — D500 Iron deficiency anemia secondary to blood loss (chronic): Secondary | ICD-10-CM

## 2017-12-05 DIAGNOSIS — Z87891 Personal history of nicotine dependence: Secondary | ICD-10-CM

## 2017-12-05 DIAGNOSIS — D61818 Other pancytopenia: Secondary | ICD-10-CM

## 2017-12-05 LAB — CBC WITH DIFFERENTIAL/PLATELET
Basophils Absolute: 0 10*3/uL (ref 0–0.1)
Basophils Relative: 1 %
Eosinophils Absolute: 0.2 10*3/uL (ref 0–0.7)
Eosinophils Relative: 4 %
HCT: 32.7 % — ABNORMAL LOW (ref 35.0–47.0)
Hemoglobin: 11.5 g/dL — ABNORMAL LOW (ref 12.0–16.0)
Lymphocytes Relative: 21 %
Lymphs Abs: 1.2 10*3/uL (ref 1.0–3.6)
MCH: 32.3 pg (ref 26.0–34.0)
MCHC: 35.2 g/dL (ref 32.0–36.0)
MCV: 91.8 fL (ref 80.0–100.0)
Monocytes Absolute: 0.4 10*3/uL (ref 0.2–0.9)
Monocytes Relative: 8 %
Neutro Abs: 3.5 10*3/uL (ref 1.4–6.5)
Neutrophils Relative %: 66 %
Platelets: 128 10*3/uL — ABNORMAL LOW (ref 150–440)
RBC: 3.56 MIL/uL — ABNORMAL LOW (ref 3.80–5.20)
RDW: 12.8 % (ref 11.5–14.5)
WBC: 5.4 10*3/uL (ref 3.6–11.0)

## 2017-12-05 LAB — IRON AND TIBC
Iron: 78 ug/dL (ref 28–170)
Saturation Ratios: 18 % (ref 10.4–31.8)
TIBC: 431 ug/dL (ref 250–450)
UIBC: 353 ug/dL

## 2017-12-05 LAB — FERRITIN: Ferritin: 74 ng/mL (ref 11–307)

## 2017-12-05 NOTE — Progress Notes (Signed)
[facility] day:  12/05/2017  Chief Complaint: Kim Ballard is a 83 y.o. female with iron deficiency anemia who is seen for  6 month assessment.  HPI:  The patient was last seen in medical oncology clinic on 09/14/2017.  At that time, she was seen for a sick call visit.  She felt weak.  She had an interval episode of diverticulitis.  Exam was stable.  Hematocrit was 32.8 with a hemoglobin 11.5.  Platelets are normal at 155,000.  WBC was normal.  Ferritin was 90.   During the interim, patient has been "alright I guess". She continues to have low energy. Patient notes a "bad spell" a few weeks ago related to her diverticulosis. She denies nausea and vomiting. She has chronic fibromyalgia pain. Insomnia has improved. She notes that she now "stays sleepy".   Patient denies that she has experienced any B symptoms. She denies any interval infections. Patient advises that she maintains an adequate appetite. She is eating well. Weight today is 150 lb 4 oz (68.2 kg), which compared to her last visit to the clinic, represents a  4 pound decrease.   Patient denies pain in the clinic today.   Past Medical History:  Diagnosis Date  . Allergic state   . Anemia   . Arthritis   . Cancer ([place])    skin cancers  . Chronic cystitis with hematuria   . Depression   . Diabetes mellitus without complication ([place])   . Fibrocystic disease of both breasts   . Fibromyalgia   . GERD (gastroesophageal reflux disease)   . Hypertension   . IBS (irritable bowel syndrome)   . Nausea   . Skin cancer   . Wears dentures    full upper and lower    Past Surgical History:  Procedure Laterality Date  . ABDOMINAL HYSTERECTOMY    . APPENDECTOMY    . BREAST BIOPSY Right 2001   surgical bx   . CATARACT EXTRACTION W/PHACO Right 03/08/2017   Procedure: CATARACT EXTRACTION PHACO AND INTRAOCULAR LENS PLACEMENT ([place]) RIGHT DIABETIC TORIC;  Surgeon: Leandrew Koyanagi, MD;   Location: [facility];  Service: Ophthalmology;  Laterality: Right;  Diabetic - oral meds  . CATARACT EXTRACTION W/PHACO Left 04/17/2017   Procedure: CATARACT EXTRACTION PHACO AND INTRAOCULAR LENS PLACEMENT ([place]) LEFT DIABETIC;  Surgeon: Leandrew Koyanagi, MD;  Location: [facility];  Service: Ophthalmology;  Laterality: Left;  diabetic-oral meds  . CHOLECYSTECTOMY    . COLONOSCOPY    . COLONOSCOPY WITH PROPOFOL N/A 08/27/2015   Procedure: COLONOSCOPY WITH PROPOFOL;  Surgeon: Manya Silvas, MD;  Location: [facility] ENDOSCOPY;  Service: Endoscopy;  Laterality: N/A;  . COLONOSCOPY WITH PROPOFOL N/A 05/02/2016   Procedure: COLONOSCOPY WITH PROPOFOL;  Surgeon: Manya Silvas, MD;  Location: [facility] ENDOSCOPY;  Service: Endoscopy;  Laterality: N/A;  . ESOPHAGOGASTRODUODENOSCOPY    . ESOPHAGOGASTRODUODENOSCOPY (EGD) WITH PROPOFOL N/A 08/27/2015   Procedure: ESOPHAGOGASTRODUODENOSCOPY (EGD) WITH PROPOFOL;  Surgeon: Manya Silvas, MD;  Location: [facility] ENDOSCOPY;  Service: Endoscopy;  Laterality: N/A;  . HERNIA REPAIR      Family History  Problem Relation Age of Onset  . Hypertension Mother   . Ovarian cancer Paternal Grandmother   . COPD Father   . Hyperlipidemia Father   . Kidney disease Father   . Diabetes Maternal Grandmother     Social History:  reports that she quit smoking about 59 years ago. She has never used smokeless tobacco. She  reports that she does not drink alcohol or use drugs.  She lives in [place].  Her husband was bedridden for 3 years. He had Parkinson's and dementia.  He died in Oct 05, 2016.  She was his primary caregiver.  The patient is alone today.  Allergies:  Allergies  Allergen Reactions  . Biaxin [Clarithromycin] Other (See Comments)    GI UPSET   . Brompheniramine Maleate   . Ceclor [Cefaclor]   . Ciprofloxacin   . Clinoril [Sulindac]   . Codeine Sulfate   . Diovan [Valsartan]   . Doxycycline   . Erythromycin   . Esomeprazole   . Fluoxetine  Other (See Comments)    UNKNOWN  . Guaifenesin & Derivatives   . Levaquin [Levofloxacin In D5w]   . Lisinopril   . Lodine [Etodolac]   . Macrodantin [Nitrofurantoin Macrocrystal]   . Medrol [Methylprednisolone]   . Mobic [Meloxicam]   . Motrin [Ibuprofen]   . Nexium [Esomeprazole Magnesium]   . Nitrofurantoin Other (See Comments)  . Norvasc [Amlodipine Besylate]   . Omnicef [Cefdinir]   . Penicillins   . Prozac [Fluoxetine Hcl] Other (See Comments)    Makes her feel crazy   . Pseudoephedrine   . Reglan [Metoclopramide]   . Seldane [Terfenadine]   . Sulfa Antibiotics Other (See Comments)  . Toprol Xl [Metoprolol Tartrate]   . Triamterene   . Tussionex Pennkinetic Er [Hydrocod Polst-Cpm Polst Er]   . Vibramycin [Doxycycline Calcium]     Current Medications: Current Outpatient Medications  Medication Sig Dispense Refill  . albuterol (PROVENTIL HFA;VENTOLIN HFA) 108 (90 Base) MCG/ACT inhaler Inhale 1-2 puffs into the lungs every 6 (six) hours as needed for wheezing or shortness of breath.    . beclomethasone (BECONASE-AQ) 42 MCG/SPRAY nasal spray Place 2 sprays into both nostrils 2 (two) times daily. Dose is for each nostril.    . clidinium-chlordiazePOXIDE (LIBRAX) 5-2.5 MG capsule Take 1 capsule by mouth.    Drusilla Kanner EXTRACT PO Take by mouth.    . fluticasone (FLONASE) 50 MCG/ACT nasal spray Place into both nostrils daily.    Marland Kitchen gemfibrozil (LOPID) 600 MG tablet Take 600 mg by mouth 2 (two) times daily before a meal.    . glipiZIDE (GLUCOTROL XL) 2.5 MG 24 hr tablet Take 2.5 mg by mouth daily with breakfast.    . hydrochlorothiazide (HYDRODIURIL) 25 MG tablet Take 25 mg by mouth daily.    . iron polysaccharides (NIFEREX) 150 MG capsule Take 150 mg by mouth daily.    . Lactobacillus Rhamnosus, GG, (CVS PROBIOTIC, LACTOBACILLUS, PO) Take by mouth.    . metFORMIN (GLUCOPHAGE) 500 MG tablet Take 500 mg by mouth 2 (two) times daily with a meal.    . Multiple Vitamins-Minerals  (MULTIVITAMIN WITH MINERALS) tablet Take 1 tablet by mouth daily.    . OMEGA-3 FATTY ACIDS PO Take by mouth.    . pantoprazole (PROTONIX) 40 MG tablet Take 40 mg by mouth daily.    . sucralfate (CARAFATE) 1 g tablet Take 1 g by mouth 4 (four) times daily -  with meals and at bedtime.    . vitamin B-12 (CYANOCOBALAMIN) 1000 MCG tablet Take 1,000 mcg by mouth daily.    . vitamin E 100 UNIT capsule Take by mouth daily.     No current facility-administered medications for this visit.    Facility-Administered Medications Ordered in Other Visits  Medication Dose Route Frequency Provider Last Rate Last Dose  . iron sucrose (VENOFER) injection 200 mg  200 mg Intravenous Once Lequita Asal, MD        Review of Systems  Constitutional: Positive for malaise/fatigue and weight loss (down 4 pounds). Negative for diaphoresis and fever.  HENT: Negative.   Eyes: Negative.   Respiratory: Negative for cough, hemoptysis, sputum production and shortness of breath.   Cardiovascular: Negative for chest pain, palpitations, orthopnea, leg swelling and PND.  Gastrointestinal: Negative for abdominal pain (known diverticulosis), blood in stool, constipation, diarrhea, melena, nausea and vomiting.       Irritable bowel  Genitourinary: Negative for dysuria, frequency, hematuria and urgency.  Musculoskeletal: Positive for myalgias (chronic fibromyalgia). Negative for back pain, falls and joint pain.  Skin: Negative for itching and rash.  Neurological: Negative for dizziness, tremors, weakness and headaches.  Endo/Heme/Allergies: Does not bruise/bleed easily.       Diabetes  Psychiatric/Behavioral: Negative for depression, memory loss and suicidal ideas. The patient is nervous/anxious. The patient does not have insomnia (HYPERsomnia).   All other systems reviewed and are negative.  Performance status (ECOG): 1 - Symptomatic but completely ambulatory  Physical Exam: Blood pressure 125/74, pulse 62,  temperature (!) 95.8 F (35.4 C), temperature source Tympanic, weight 150 lb 4 oz (68.2 kg), SpO2 97 %. GENERAL:  Well developed, well nourished, woman sitting comfortably in the exam room in no acute distress.  She appears younger than stated age. MENTAL STATUS:  Alert and oriented to person, place and time. HEAD:  Black hair.  Normocephalic, atraumatic, face symmetric, no Cushingoid features. EYES:  Glasses.  Blue eyes.  Pupils equal round and reactive to light and accomodation.  No conjunctivitis or scleral icterus. ENT:  Oropharynx clear without lesion.  Tongue normal.  Dentures.  Mucous membranes moist.  RESPIRATORY:  Clear to auscultation without rales, wheezes or rhonchi. CARDIOVASCULAR:  Regular rate and rhythm without murmur, rub or gallop. ABDOMEN:  Soft, non-tender, with active bowel sounds, and no hepatosplenomegaly.  No masses. SKIN:  No rashes, ulcers or lesions. EXTREMITIES: No edema, no skin discoloration or tenderness.  No palpable cords. LYMPH NODES: No palpable cervical, supraclavicular, axillary or inguinal adenopathy  NEUROLOGICAL: Unremarkable. PSYCH:  Appropriate.    Appointment on 12/05/2017  Component Date Value Ref Range Status  . WBC 12/05/2017 5.4  3.6 - 11.0 K/uL Final  . RBC 12/05/2017 3.56* 3.80 - 5.20 MIL/uL Final  . Hemoglobin 12/05/2017 11.5* 12.0 - 16.0 g/dL Final  . HCT 12/05/2017 32.7* 35.0 - 47.0 % Final  . MCV 12/05/2017 91.8  80.0 - 100.0 fL Final  . MCH 12/05/2017 32.3  26.0 - 34.0 pg Final  . MCHC 12/05/2017 35.2  32.0 - 36.0 g/dL Final  . RDW 12/05/2017 12.8  11.5 - 14.5 % Final  . Platelets 12/05/2017 128* 150 - 440 K/uL Final  . Neutrophils Relative % 12/05/2017 66  % Final  . Neutro Abs 12/05/2017 3.5  1.4 - 6.5 K/uL Final  . Lymphocytes Relative 12/05/2017 21  % Final  . Lymphs Abs 12/05/2017 1.2  1.0 - 3.6 K/uL Final  . Monocytes Relative 12/05/2017 8  % Final  . Monocytes Absolute 12/05/2017 0.4  0.2 - 0.9 K/uL Final  . Eosinophils  Relative 12/05/2017 4  % Final  . Eosinophils Absolute 12/05/2017 0.2  0 - 0.7 K/uL Final  . Basophils Relative 12/05/2017 1  % Final  . Basophils Absolute 12/05/2017 0.0  0 - 0.1 K/uL Final   Performed at [facility], [address], [place], [place] [address]  Assessment:  Kim Ballard is a 83 y.o. female with iron deficiency anemia.  She has had anemia since 12/24/2014.  Hematocrit previously ranged between 32.3 - 34. She was on oral iron. Iron causes constipation.    EGD on 08/27/2015 revealed LA grade A reflux esophagitis and erythematous mucosa in the gastric body and antrum.  Gastric biopsy revealed healing mucosal injury and focal intestinal metaplasia.   There was iron pill gastritis.    Colonoscopy on 08/27/2015 revealed 1 medium polyp in the cecum (tubulovillous adenoma).  Colonoscopy on 05/02/2016 revealed 2 diminutive polyps in the ascending colon and in the cecum.  Pathology revealed tubular adenoma negative for high grade dysplasia and malignancy.   Diet is fair.  She eats chicken or beef every other day. She eats green leafy vegetables. She denies any cravings.  Workup on 01/19/2016 revealed a hematocrit 29.9, hemoglobin 10.5, MCV 88.3, platelets 152,000, white count 4100 with an ANC of 2600. Reticulocyte count was 2.8%.  The following studies were normal:  ANA, rheumatoid factor, SPEP, free light chain ratio, Coom[place], B12, folate.  Ferritin was 38 (low) . Iron studies included a saturation of 14% and TIBC of 462 (high) consistent with iron deficiency anemia.  She received Venofer 200 mg IV x 2 (03/25/2016 and 04/01/2016) and x 2 (09/30/2016 and 10/07/2016).  Ferritin has been followed: 36 on 09/21/2016, 128 on 12/15/2016, 94 on 03/23/2017, 92 on 06/15/2017, 90 on 09/14/2017, and 74 on 12/05/2017.  She has had mild thrombocytopenia possibly secondary to ITP.  Platelet count has ranged between 105,000 and 152,000 from 11/12/2013 to present without trend.  Abdominal  and pelvic CT scan on 10/28/2015 revealed no hepatosplenomegaly.  Bilateral screening mammogram on 06/14/2016 revealed no evidence of malignancy.  Symptomatically, she continues to be fatigued.  She has had an interval episode of diverticulitis that did not require treatment.  Exam is stable.  Hematocrit is 32.7 with a hemoglobin 11.5.  Platelets are 128,000.  WBC is normal.  Ferritin is 74.   Plan: 1.  Labs today: CBC with diff, ferritin, iron studies. 2.  Call patient with ferritin (results available after clinic). 3.  RTC in 3 months for labs (CBC with diff, ferritin). 4.  RTC in 6 months for MD assessment and labs (CBC with diff, ferritin, iron studies).   Honor Loh, NP  12/05/2017,12:06 PM   I saw and evaluated the patient, participating in the key portions of the service and reviewing pertinent diagnostic studies and records.  I reviewed the nurse practitioner's note and agree with the findings and the plan.  The assessment and plan were discussed with the patient.  A few questions were asked by the patient and answered.   Nolon Stalls, MD 12/05/2017,12:06 PM

## 2017-12-05 NOTE — Progress Notes (Signed)
Patient states she had a bout of diarrhea over the past week.  States she is much better now.  Also continues to be fatigued and sleepy all the time.

## 2017-12-12 ENCOUNTER — Ambulatory Visit: Payer: Medicare HMO | Admitting: Urology

## 2017-12-12 ENCOUNTER — Encounter: Payer: Self-pay | Admitting: Urology

## 2017-12-12 VITALS — BP 183/73 | HR 70 | Ht 67.5 in | Wt 152.0 lb

## 2017-12-12 DIAGNOSIS — N390 Urinary tract infection, site not specified: Secondary | ICD-10-CM | POA: Diagnosis not present

## 2017-12-12 DIAGNOSIS — Z87440 Personal history of urinary (tract) infections: Secondary | ICD-10-CM | POA: Diagnosis not present

## 2017-12-12 DIAGNOSIS — N3020 Other chronic cystitis without hematuria: Secondary | ICD-10-CM | POA: Diagnosis not present

## 2017-12-12 DIAGNOSIS — R339 Retention of urine, unspecified: Secondary | ICD-10-CM | POA: Diagnosis not present

## 2017-12-12 LAB — MICROSCOPIC EXAMINATION
Bacteria, UA: NONE SEEN
Epithelial Cells (non renal): NONE SEEN /hpf (ref 0–10)
RBC, UA: NONE SEEN /hpf (ref 0–2)

## 2017-12-12 LAB — URINALYSIS, COMPLETE
Bilirubin, UA: NEGATIVE
Glucose, UA: NEGATIVE
Ketones, UA: NEGATIVE
Leukocytes, UA: NEGATIVE
Nitrite, UA: NEGATIVE
Protein, UA: NEGATIVE
RBC, UA: NEGATIVE
Specific Gravity, UA: 1.015 (ref 1.005–1.030)
Urobilinogen, Ur: 0.2 mg/dL (ref 0.2–1.0)
pH, UA: 5.5 (ref 5.0–7.5)

## 2017-12-14 ENCOUNTER — Encounter: Payer: Self-pay | Admitting: Urology

## 2017-12-14 NOTE — Progress Notes (Signed)
12/12/2017 10:25 AM   Kim Ballard 1935/04/25 [id number]  Referring provider: Tracie Harrier, MD [address] [facility] [place], [place] [address]  Chief Complaint  Patient presents with  . Establish Care    HPI: Kim Ballard is an 83 year old female who presents for transfer of care.  She has been followed by Dr. Jacqlyn Larsen for recurrent urinary tract infections for several years.  She last saw him in April 2016 at [facility].  She had an E. coli infection at that time which was treated.  A follow-up urine culture at [facility] in May 2019 grew 25,000-50,000 E. coli however she was asymptomatic and a follow-up culture at [facility] was negative.  She presently has no complaints.  She denies dysuria or gross hematuria.   PMH: Past Medical History:  Diagnosis Date  . Allergic state   . Anemia   . Arthritis   . Cancer ([place])    skin cancers  . Chronic cystitis with hematuria   . Depression   . Diabetes mellitus without complication ([place])   . Fibrocystic disease of both breasts   . Fibromyalgia   . GERD (gastroesophageal reflux disease)   . Hypertension   . IBS (irritable bowel syndrome)   . Nausea   . Skin cancer   . Wears dentures    full upper and lower    Surgical History: Past Surgical History:  Procedure Laterality Date  . ABDOMINAL HYSTERECTOMY    . APPENDECTOMY    . BREAST BIOPSY Right 2001   surgical bx   . CATARACT EXTRACTION W/PHACO Right 03/08/2017   Procedure: CATARACT EXTRACTION PHACO AND INTRAOCULAR LENS PLACEMENT ([place]) RIGHT DIABETIC TORIC;  Surgeon: Leandrew Koyanagi, MD;  Location: [place];  Service: Ophthalmology;  Laterality: Right;  Diabetic - oral meds  . CATARACT EXTRACTION W/PHACO Left 04/17/2017   Procedure: CATARACT EXTRACTION PHACO AND INTRAOCULAR LENS PLACEMENT ([place]) LEFT DIABETIC;  Surgeon: Leandrew Koyanagi, MD;  Location: [place];  Service: Ophthalmology;  Laterality: Left;  diabetic-oral meds  .  CHOLECYSTECTOMY    . COLONOSCOPY    . COLONOSCOPY WITH PROPOFOL N/A 08/27/2015   Procedure: COLONOSCOPY WITH PROPOFOL;  Surgeon: Manya Silvas, MD;  Location: [facility] ENDOSCOPY;  Service: Endoscopy;  Laterality: N/A;  . COLONOSCOPY WITH PROPOFOL N/A 05/02/2016   Procedure: COLONOSCOPY WITH PROPOFOL;  Surgeon: Manya Silvas, MD;  Location: [facility] ENDOSCOPY;  Service: Endoscopy;  Laterality: N/A;  . ESOPHAGOGASTRODUODENOSCOPY    . ESOPHAGOGASTRODUODENOSCOPY (EGD) WITH PROPOFOL N/A 08/27/2015   Procedure: ESOPHAGOGASTRODUODENOSCOPY (EGD) WITH PROPOFOL;  Surgeon: Manya Silvas, MD;  Location: [facility] ENDOSCOPY;  Service: Endoscopy;  Laterality: N/A;  . HERNIA REPAIR      Home Medications:  Allergies as of 12/12/2017      Reactions   Biaxin [clarithromycin] Other (See Comments)   GI UPSET   Brompheniramine Maleate    Ceclor [cefaclor]    Ciprofloxacin    Clinoril [sulindac]    Codeine Sulfate    Diovan [valsartan]    Doxycycline    Erythromycin    Esomeprazole    Fluoxetine Other (See Comments)   UNKNOWN   Guaifenesin & Derivatives    Levaquin [levofloxacin In D5w]    Lisinopril    Lodine [etodolac]    Macrodantin [nitrofurantoin Macrocrystal]    Medrol [methylprednisolone]    Mobic [meloxicam]    Motrin [ibuprofen]    Nexium [esomeprazole Magnesium]    Nitrofurantoin Other (See Comments)   Norvasc [amlodipine Besylate]    Omnicef [  cefdinir]    Penicillins    Prozac [fluoxetine Hcl] Other (See Comments)   Makes her feel crazy   Pseudoephedrine    Reglan [metoclopramide]    Seldane [terfenadine]    Sulfa Antibiotics Other (See Comments)   Toprol Xl [metoprolol Tartrate]    Triamterene    Tussionex Pennkinetic Er [hydrocod Polst-cpm Polst Er]    Vibramycin [doxycycline Calcium]       Medication List        Accurate as of 12/12/17 11:59 PM. Always use your most recent med list.          clidinium-chlordiazePOXIDE 5-2.5 MG capsule Commonly known as:  LIBRAX Take 1  capsule by mouth.   CRANBERRY EXTRACT PO Take by mouth.   CVS PROBIOTIC (LACTOBACILLUS) PO Take by mouth.   FLONASE 50 MCG/ACT nasal spray Generic drug:  fluticasone Place into both nostrils daily.   gemfibrozil 600 MG tablet Commonly known as:  LOPID Take 600 mg by mouth 2 (two) times daily before a meal.   glipiZIDE 2.5 MG 24 hr tablet Commonly known as:  GLUCOTROL XL Take 2.5 mg by mouth daily with breakfast.   hydrochlorothiazide 25 MG tablet Commonly known as:  HYDRODIURIL Take 25 mg by mouth daily.   iron polysaccharides 150 MG capsule Commonly known as:  NIFEREX Take 150 mg by mouth daily.   metFORMIN 500 MG tablet Commonly known as:  GLUCOPHAGE Take 500 mg by mouth 2 (two) times daily with a meal.   multivitamin with minerals tablet Take 1 tablet by mouth daily.   OMEGA-3 FATTY ACIDS PO Take by mouth.   pantoprazole 40 MG tablet Commonly known as:  PROTONIX Take 40 mg by mouth daily.   sucralfate 1 g tablet Commonly known as:  CARAFATE Take 1 g by mouth 4 (four) times daily -  with meals and at bedtime.   vitamin B-12 1000 MCG tablet Commonly known as:  CYANOCOBALAMIN Take 1,000 mcg by mouth daily.   vitamin E 100 UNIT capsule Take by mouth daily.       Allergies:  Allergies  Allergen Reactions  . Biaxin [Clarithromycin] Other (See Comments)    GI UPSET   . Brompheniramine Maleate   . Ceclor [Cefaclor]   . Ciprofloxacin   . Clinoril [Sulindac]   . Codeine Sulfate   . Diovan [Valsartan]   . Doxycycline   . Erythromycin   . Esomeprazole   . Fluoxetine Other (See Comments)    UNKNOWN  . Guaifenesin & Derivatives   . Levaquin [Levofloxacin In D5w]   . Lisinopril   . Lodine [Etodolac]   . Macrodantin [Nitrofurantoin Macrocrystal]   . Medrol [Methylprednisolone]   . Mobic [Meloxicam]   . Motrin [Ibuprofen]   . Nexium [Esomeprazole Magnesium]   . Nitrofurantoin Other (See Comments)  . Norvasc [Amlodipine Besylate]   . Omnicef  [Cefdinir]   . Penicillins   . Prozac [Fluoxetine Hcl] Other (See Comments)    Makes her feel crazy   . Pseudoephedrine   . Reglan [Metoclopramide]   . Seldane [Terfenadine]   . Sulfa Antibiotics Other (See Comments)  . Toprol Xl [Metoprolol Tartrate]   . Triamterene   . Tussionex Pennkinetic Er [Hydrocod Polst-Cpm Polst Er]   . Vibramycin [Doxycycline Calcium]     Family History: Family History  Problem Relation Age of Onset  . Hypertension Mother   . Ovarian cancer Paternal Grandmother   . COPD Father   . Hyperlipidemia Father   . Kidney disease Father   .  Diabetes Maternal Grandmother     Social History:  reports that she quit smoking about 59 years ago. She has never used smokeless tobacco. She reports that she does not drink alcohol or use drugs.  ROS: UROLOGY Frequent Urination?: Yes Hard to postpone urination?: No Burning/pain with urination?: Yes Get up at night to urinate?: Yes Leakage of urine?: Yes Urine stream starts and stops?: No Trouble starting stream?: No Do you have to strain to urinate?: No Blood in urine?: No Urinary tract infection?: Yes Sexually transmitted disease?: No Injury to kidneys or bladder?: No Painful intercourse?: No Weak stream?: No Currently pregnant?: No Vaginal bleeding?: No Last menstrual period?: Hysterectomy   Gastrointestinal Nausea?: No Vomiting?: No Indigestion/heartburn?: No Diarrhea?: Yes Constipation?: Yes  Constitutional Fever: No Night sweats?: No Weight loss?: No Fatigue?: Yes  Skin Skin rash/lesions?: No Itching?: No  Eyes Blurred vision?: No Double vision?: No  Ears/Nose/Throat Sore throat?: No Sinus problems?: Yes  Hematologic/Lymphatic Swollen glands?: No Easy bruising?: No  Cardiovascular Leg swelling?: No Chest pain?: No  Respiratory Cough?: No Shortness of breath?: No  Endocrine Excessive thirst?: No  Musculoskeletal Back pain?: No Joint pain?:  Yes  Neurological Headaches?: No Dizziness?: No  Psychologic Depression?: No Anxiety?: No  Physical Exam: BP (!) 183/73 (BP Location: Left Arm, Patient Position: Sitting, Cuff Size: Normal)   Pulse 70   Ht 5' 7.5" (1.715 m)   Wt 152 lb (68.9 kg)   BMI 23.46 kg/m   Constitutional:  Alert and oriented, No acute distress. HEENT: [place] AT, moist mucus membranes.  Trachea midline, no masses. Cardiovascular: No clubbing, cyanosis, or edema. Respiratory: Normal respiratory effort, no increased work of breathing. GI: Abdomen is soft, nontender, nondistended, no abdominal masses GU: No CVA tenderness Lymph: No cervical or inguinal lymphadenopathy. Skin: No rashes, bruises or suspicious lesions. Neurologic: Grossly intact, no focal deficits, moving all 4 extremities. Psychiatric: Normal mood and affect.  Laboratory Data:  Urinalysis Dipstick/microscopy negative  Assessment & Plan:   83 year old female with recurrent UTIs.  Urinalysis today is clear and she is asymptomatic.  Will see her back in 6 months and she was instructed to call earlier as needed for UTI symptoms.    Return in about 6 months (around 06/14/2018) for Recheck.  Abbie Sons, [place] [address], [place], [place] [address] [phone]

## 2017-12-15 DIAGNOSIS — K529 Noninfective gastroenteritis and colitis, unspecified: Secondary | ICD-10-CM | POA: Diagnosis not present

## 2017-12-21 ENCOUNTER — Other Ambulatory Visit
Admission: RE | Admit: 2017-12-21 | Discharge: 2017-12-21 | Disposition: A | Discharge status: Home / Self Care | Payer: Medicare HMO | Source: Ambulatory Visit | Attending: Student | Admitting: Student

## 2017-12-21 DIAGNOSIS — K529 Noninfective gastroenteritis and colitis, unspecified: Secondary | ICD-10-CM | POA: Insufficient documentation

## 2017-12-21 LAB — GASTROINTESTINAL PANEL BY PCR, STOOL (REPLACES STOOL CULTURE)

## 2017-12-21 LAB — C DIFFICILE QUICK SCREEN W PCR REFLEX
C Diff antigen: NEGATIVE
C Diff interpretation: NOT DETECTED
C Diff toxin: NEGATIVE

## 2017-12-26 LAB — CALPROTECTIN, FECAL: Calprotectin, Fecal: <16 ug/g (ref 0–120)

## 2017-12-27 LAB — PANCREATIC ELASTASE, FECAL: Pancreatic Elastase-1, Stool: >500 ug Elast./g (ref >200)

## 2017-12-28 DIAGNOSIS — K5900 Constipation, unspecified: Secondary | ICD-10-CM | POA: Diagnosis not present

## 2017-12-29 ENCOUNTER — Other Ambulatory Visit: Payer: Self-pay | Admitting: Student

## 2017-12-29 DIAGNOSIS — R6881 Early satiety: Secondary | ICD-10-CM

## 2017-12-29 DIAGNOSIS — R1011 Right upper quadrant pain: Secondary | ICD-10-CM

## 2017-12-29 DIAGNOSIS — K5900 Constipation, unspecified: Secondary | ICD-10-CM | POA: Diagnosis not present

## 2017-12-29 DIAGNOSIS — R1012 Left upper quadrant pain: Secondary | ICD-10-CM

## 2018-01-03 ENCOUNTER — Ambulatory Visit
Admission: RE | Admit: 2018-01-03 | Discharge: 2018-01-03 | Disposition: A | Discharge status: Home / Self Care | Payer: Medicare HMO | Source: Ambulatory Visit | Attending: Student | Admitting: Student

## 2018-01-03 DIAGNOSIS — K228 Other specified diseases of esophagus: Secondary | ICD-10-CM | POA: Insufficient documentation

## 2018-01-03 DIAGNOSIS — R6881 Early satiety: Secondary | ICD-10-CM

## 2018-01-03 DIAGNOSIS — R1012 Left upper quadrant pain: Secondary | ICD-10-CM | POA: Diagnosis not present

## 2018-01-03 DIAGNOSIS — R1011 Right upper quadrant pain: Secondary | ICD-10-CM

## 2018-01-03 DIAGNOSIS — R109 Unspecified abdominal pain: Secondary | ICD-10-CM | POA: Diagnosis not present

## 2018-01-08 DIAGNOSIS — Z9181 History of falling: Secondary | ICD-10-CM | POA: Diagnosis not present

## 2018-01-08 DIAGNOSIS — M25511 Pain in right shoulder: Secondary | ICD-10-CM | POA: Diagnosis not present

## 2018-01-08 DIAGNOSIS — M19011 Primary osteoarthritis, right shoulder: Secondary | ICD-10-CM | POA: Diagnosis not present

## 2018-01-16 DIAGNOSIS — M9903 Segmental and somatic dysfunction of lumbar region: Secondary | ICD-10-CM | POA: Diagnosis not present

## 2018-01-16 DIAGNOSIS — M461 Sacroiliitis, not elsewhere classified: Secondary | ICD-10-CM | POA: Diagnosis not present

## 2018-01-16 DIAGNOSIS — M546 Pain in thoracic spine: Secondary | ICD-10-CM | POA: Diagnosis not present

## 2018-01-16 DIAGNOSIS — M542 Cervicalgia: Secondary | ICD-10-CM | POA: Diagnosis not present

## 2018-01-16 DIAGNOSIS — M545 Low back pain: Secondary | ICD-10-CM | POA: Diagnosis not present

## 2018-01-16 DIAGNOSIS — G44209 Tension-type headache, unspecified, not intractable: Secondary | ICD-10-CM | POA: Diagnosis not present

## 2018-01-16 DIAGNOSIS — M9902 Segmental and somatic dysfunction of thoracic region: Secondary | ICD-10-CM | POA: Diagnosis not present

## 2018-01-16 DIAGNOSIS — M9904 Segmental and somatic dysfunction of sacral region: Secondary | ICD-10-CM | POA: Diagnosis not present

## 2018-01-16 DIAGNOSIS — M9901 Segmental and somatic dysfunction of cervical region: Secondary | ICD-10-CM | POA: Diagnosis not present

## 2018-01-17 DIAGNOSIS — M25551 Pain in right hip: Secondary | ICD-10-CM | POA: Diagnosis not present

## 2018-01-17 DIAGNOSIS — M7061 Trochanteric bursitis, right hip: Secondary | ICD-10-CM | POA: Diagnosis not present

## 2018-01-22 ENCOUNTER — Other Ambulatory Visit: Payer: Self-pay | Admitting: Physician Assistant

## 2018-01-22 DIAGNOSIS — M25551 Pain in right hip: Secondary | ICD-10-CM

## 2018-01-25 DIAGNOSIS — M546 Pain in thoracic spine: Secondary | ICD-10-CM | POA: Diagnosis not present

## 2018-01-25 DIAGNOSIS — M9902 Segmental and somatic dysfunction of thoracic region: Secondary | ICD-10-CM | POA: Diagnosis not present

## 2018-01-25 DIAGNOSIS — M9904 Segmental and somatic dysfunction of sacral region: Secondary | ICD-10-CM | POA: Diagnosis not present

## 2018-01-25 DIAGNOSIS — M9903 Segmental and somatic dysfunction of lumbar region: Secondary | ICD-10-CM | POA: Diagnosis not present

## 2018-01-25 DIAGNOSIS — M545 Low back pain: Secondary | ICD-10-CM | POA: Diagnosis not present

## 2018-01-25 DIAGNOSIS — M461 Sacroiliitis, not elsewhere classified: Secondary | ICD-10-CM | POA: Diagnosis not present

## 2018-02-07 DIAGNOSIS — D649 Anemia, unspecified: Secondary | ICD-10-CM | POA: Diagnosis not present

## 2018-02-07 DIAGNOSIS — Z9181 History of falling: Secondary | ICD-10-CM | POA: Diagnosis not present

## 2018-02-14 DIAGNOSIS — H60501 Unspecified acute noninfective otitis externa, right ear: Secondary | ICD-10-CM | POA: Diagnosis not present

## 2018-02-14 DIAGNOSIS — R8290 Unspecified abnormal findings in urine: Secondary | ICD-10-CM | POA: Diagnosis not present

## 2018-02-14 DIAGNOSIS — I10 Essential (primary) hypertension: Secondary | ICD-10-CM | POA: Diagnosis not present

## 2018-02-14 DIAGNOSIS — E119 Type 2 diabetes mellitus without complications: Secondary | ICD-10-CM | POA: Diagnosis not present

## 2018-02-14 DIAGNOSIS — F3289 Other specified depressive episodes: Secondary | ICD-10-CM | POA: Diagnosis not present

## 2018-02-14 DIAGNOSIS — D696 Thrombocytopenia, unspecified: Secondary | ICD-10-CM | POA: Diagnosis not present

## 2018-02-15 DIAGNOSIS — H6123 Impacted cerumen, bilateral: Secondary | ICD-10-CM | POA: Diagnosis not present

## 2018-02-15 DIAGNOSIS — R42 Dizziness and giddiness: Secondary | ICD-10-CM | POA: Diagnosis not present

## 2018-02-15 DIAGNOSIS — H60549 Acute eczematoid otitis externa, unspecified ear: Secondary | ICD-10-CM | POA: Diagnosis not present

## 2018-02-19 ENCOUNTER — Ambulatory Visit (INDEPENDENT_AMBULATORY_CARE_PROVIDER_SITE_OTHER): Payer: Medicare HMO

## 2018-02-19 VITALS — BP 171/67 | HR 84

## 2018-02-19 DIAGNOSIS — N390 Urinary tract infection, site not specified: Secondary | ICD-10-CM

## 2018-02-19 LAB — URINALYSIS, COMPLETE
Bilirubin, UA: NEGATIVE
Glucose, UA: NEGATIVE
Ketones, UA: NEGATIVE
Nitrite, UA: NEGATIVE
Protein, UA: NEGATIVE
RBC, UA: NEGATIVE
Specific Gravity, UA: 1.02 (ref 1.005–1.030)
Urobilinogen, Ur: 0.2 mg/dL (ref 0.2–1.0)
pH, UA: 5 (ref 5.0–7.5)

## 2018-02-19 LAB — MICROSCOPIC EXAMINATION: RBC, UA: NONE SEEN /hpf (ref 0–2)

## 2018-02-19 NOTE — Progress Notes (Signed)
Patient present to the office today complaining of urinary frequency, dysuria, and lower abd pain. UA today will be sent for culture and will call with results. UA today is not grossly positive will wait on culture to treat. Patient verbalized understanding

## 2018-02-21 ENCOUNTER — Telehealth: Payer: Self-pay | Admitting: Urology

## 2018-02-21 LAB — CULTURE, URINE COMPREHENSIVE

## 2018-02-21 NOTE — Telephone Encounter (Signed)
I called pt, her culture is negative. Larene [place] advised pt to seek medical attention from ED. She denied this and said she would call her primary care office.

## 2018-02-21 NOTE — Telephone Encounter (Signed)
Pt states she is having bladder pain, lower abdominal pain.  Pt is running a fever of 101 and chills.  She dropped a urine sample off Monday.  Please give pt a call [phone]

## 2018-03-07 ENCOUNTER — Other Ambulatory Visit: Payer: Self-pay

## 2018-03-07 ENCOUNTER — Telehealth: Payer: Self-pay | Admitting: *Deleted

## 2018-03-07 ENCOUNTER — Inpatient Hospital Stay: Payer: Medicare HMO | Attending: Hematology and Oncology

## 2018-03-07 DIAGNOSIS — K5900 Constipation, unspecified: Secondary | ICD-10-CM | POA: Insufficient documentation

## 2018-03-07 DIAGNOSIS — R10811 Right upper quadrant abdominal tenderness: Secondary | ICD-10-CM | POA: Insufficient documentation

## 2018-03-07 DIAGNOSIS — R197 Diarrhea, unspecified: Secondary | ICD-10-CM | POA: Diagnosis not present

## 2018-03-07 DIAGNOSIS — R161 Splenomegaly, not elsewhere classified: Secondary | ICD-10-CM | POA: Insufficient documentation

## 2018-03-07 DIAGNOSIS — D500 Iron deficiency anemia secondary to blood loss (chronic): Secondary | ICD-10-CM

## 2018-03-07 DIAGNOSIS — Z87891 Personal history of nicotine dependence: Secondary | ICD-10-CM | POA: Insufficient documentation

## 2018-03-07 DIAGNOSIS — D696 Thrombocytopenia, unspecified: Secondary | ICD-10-CM | POA: Diagnosis not present

## 2018-03-07 LAB — CBC WITH DIFFERENTIAL/PLATELET
Basophils Absolute: 0 10*3/uL (ref 0–0.1)
Basophils Relative: 0 %
Eosinophils Absolute: 0.2 10*3/uL (ref 0–0.7)
Eosinophils Relative: 4 %
HCT: 29.5 % — ABNORMAL LOW (ref 35.0–47.0)
Hemoglobin: 10.2 g/dL — ABNORMAL LOW (ref 12.0–16.0)
Lymphocytes Relative: 23 %
Lymphs Abs: 1 10*3/uL (ref 1.0–3.6)
MCH: 32 pg (ref 26.0–34.0)
MCHC: 34.4 g/dL (ref 32.0–36.0)
MCV: 93.1 fL (ref 80.0–100.0)
Monocytes Absolute: 0.4 10*3/uL (ref 0.2–0.9)
Monocytes Relative: 9 %
Neutro Abs: 2.7 10*3/uL (ref 1.4–6.5)
Neutrophils Relative %: 64 %
Platelets: 135 10*3/uL — ABNORMAL LOW (ref 150–440)
RBC: 3.17 MIL/uL — ABNORMAL LOW (ref 3.80–5.20)
RDW: 13.1 % (ref 11.5–14.5)
WBC: 4.3 10*3/uL (ref 3.6–11.0)

## 2018-03-07 LAB — FERRITIN: Ferritin: 55 ng/mL (ref 11–307)

## 2018-03-07 NOTE — Telephone Encounter (Signed)
-----   Message from Lequita Asal, MD sent at 03/07/2018  4:59 PM EDT ----- Regarding: Please call patient  Hematocrit has dropped, but iron stores "appear normal"  (? falsely elevated if patient sick).  Any bleeding?  Find out what is happening.  She will likely need to be seen in the symptom management clinic.  M

## 2018-03-07 NOTE — Telephone Encounter (Signed)
Called patient to inquire if she is having any bleeding.  Her counts have dropped significantly.  She did state to me that she has been having problems with her stomach and has not been feeling well.  She is having some frank blood on the tissue after having a stool.  Dr. Mike Gip would like her to be seen in [facility] tomorrow.  Will send message to scheduling.

## 2018-03-08 ENCOUNTER — Other Ambulatory Visit: Payer: Self-pay | Admitting: *Deleted

## 2018-03-08 ENCOUNTER — Encounter: Payer: Self-pay | Admitting: Oncology

## 2018-03-08 ENCOUNTER — Inpatient Hospital Stay: Payer: Medicare HMO

## 2018-03-08 ENCOUNTER — Inpatient Hospital Stay (HOSPITAL_BASED_OUTPATIENT_CLINIC_OR_DEPARTMENT_OTHER): Payer: Medicare HMO | Admitting: Oncology

## 2018-03-08 ENCOUNTER — Other Ambulatory Visit: Payer: Self-pay

## 2018-03-08 VITALS — BP 163/83 | HR 63 | Temp 97.3°F | Resp 18 | Wt 150.0 lb

## 2018-03-08 DIAGNOSIS — R161 Splenomegaly, not elsewhere classified: Secondary | ICD-10-CM

## 2018-03-08 DIAGNOSIS — D696 Thrombocytopenia, unspecified: Secondary | ICD-10-CM | POA: Diagnosis not present

## 2018-03-08 DIAGNOSIS — D500 Iron deficiency anemia secondary to blood loss (chronic): Secondary | ICD-10-CM | POA: Diagnosis not present

## 2018-03-08 DIAGNOSIS — D508 Other iron deficiency anemias: Secondary | ICD-10-CM

## 2018-03-08 DIAGNOSIS — R197 Diarrhea, unspecified: Secondary | ICD-10-CM | POA: Diagnosis not present

## 2018-03-08 DIAGNOSIS — Z87891 Personal history of nicotine dependence: Secondary | ICD-10-CM

## 2018-03-08 DIAGNOSIS — K5900 Constipation, unspecified: Secondary | ICD-10-CM

## 2018-03-08 DIAGNOSIS — R10811 Right upper quadrant abdominal tenderness: Secondary | ICD-10-CM | POA: Diagnosis not present

## 2018-03-08 LAB — CBC WITH DIFFERENTIAL/PLATELET
Basophils Absolute: 0 10*3/uL (ref 0–0.1)
Basophils Relative: 1 %
Eosinophils Absolute: 0.2 10*3/uL (ref 0–0.7)
Eosinophils Relative: 5 %
HCT: 32.3 % — ABNORMAL LOW (ref 35.0–47.0)
Hemoglobin: 11.1 g/dL — ABNORMAL LOW (ref 12.0–16.0)
Lymphocytes Relative: 18 %
Lymphs Abs: 0.8 10*3/uL — ABNORMAL LOW (ref 1.0–3.6)
MCH: 32 pg (ref 26.0–34.0)
MCHC: 34.2 g/dL (ref 32.0–36.0)
MCV: 93.5 fL (ref 80.0–100.0)
Monocytes Absolute: 0.4 10*3/uL (ref 0.2–0.9)
Monocytes Relative: 8 %
Neutro Abs: 3.1 10*3/uL (ref 1.4–6.5)
Neutrophils Relative %: 68 %
Platelets: 143 10*3/uL — ABNORMAL LOW (ref 150–440)
RBC: 3.45 MIL/uL — ABNORMAL LOW (ref 3.80–5.20)
RDW: 13.2 % (ref 11.5–14.5)
WBC: 4.6 10*3/uL (ref 3.6–11.0)

## 2018-03-08 LAB — IRON AND TIBC
Iron: 69 ug/dL (ref 28–170)
Saturation Ratios: 16 % (ref 10.4–31.8)
TIBC: 423 ug/dL (ref 250–450)
UIBC: 354 ug/dL

## 2018-03-08 LAB — RETICULOCYTES
RBC.: 3.33 MIL/uL — ABNORMAL LOW (ref 3.80–5.20)
Retic Count, Absolute: 69.9 10*3/uL (ref 19.0–183.0)
Retic Ct Pct: 2.1 % (ref 0.4–3.1)

## 2018-03-08 LAB — SEDIMENTATION RATE: Sed Rate: 51 mm/hr — ABNORMAL HIGH (ref 0–30)

## 2018-03-08 NOTE — Progress Notes (Signed)
Symptom Management Kim Ballard  [phone] [phone]  Patient Care Team: Tracie Harrier, MD as PCP - General (Internal Medicine) Tracie Harrier, MD as Physician Assistant (Internal Medicine) Lequita Asal, MD as Consulting Physician (Hematology and Oncology)   Name of the patient: Kim Ballard  [id number]  21-Feb-1935   Date of visit: 03/09/18  Diagnosis- Iron Deficiency Anemia  Chief complaint/ Reason for visit- Anemia  Hematology History: Patient was last seen by primary hematologist Dr. Mike Gip on 12/05/2017 which she admitted to feeling "all right".  She continued to have low energy and recent exacerbation of diverticulosis.  She denies any nausea or vomiting.  She complained of chronic fibromyalgia pain and " sleepiness".  She maintained a good appetite despite a 4 pound weight loss.  She denied any pain.  She is scheduled to return to clinic in approximately 3 months for labs in 6 months with labs and MD assessment.  Kim Ballard, 83 year old female, with iron deficiency anemia since 12/24/2014.  She was previously on oral iron but suffered significant constipation.  EGD on 08/27/2015 revealed LA grade A reflux esophagitis and erythematous mucosa in the gastric body and antrum.  Gastric biopsy revealed healing mucosal injury and focal intestinal metaplasia.  There was iron pill gastritis.  Colonoscopy on 08/27/2015 revealed one medium polyp in the cecum (tuboluvillous adenoma).  Colonoscopy on 05/02/2016 revealed 2 diminutive polyps in the ascending colon and cecum.  Pathology revealed tubular adenoma negative for high-grade dysplasia and/or malignancy.  Workup on 01/19/2016 revealed a hematocrit 29.9, hemoglobin 10.5, MCV 88.3, platelets 152,000, white count 4100 with an ANC of 2600. Reticulocyte count was 2.8%.  The following studies were normal:  ANA, rheumatoid factor, SPEP, free light chain ratio, [place], B12, folate.   Ferritin was 38 (low) . Iron studies included a saturation of 14% and TIBC of 462 (high) consistent with iron deficiency anemia.  She received Venofer 200 mg IV x 2 (03/25/2016, 04/01/2016) and x 2 (09/30/2016, 10/07/2016).   She has mild thrombocytopenia possibly secondary to ITP.  Platelet count has ranged from 105,008 152006/02/2014 to present without trend.  Abdominal pelvic CT on 10/28/2015 revealed no hepatosplenomegaly.  Bilateral screening mammogram on 06/14/2016 revealed no evidence of malignancy.  She is followed by Dr. Vira Agar, GI for history of chronic diarrhea.  GI panel C. difficile, calprotectin and pancreatic elastase were negative.  Upper GI Series with barium revealed mild changes of presbyesophagus, no evidence of obstruction at GE junction, mesh donut anti-reflex chin device intact, no GERD, normal-appearing stomach and duodenum, normal-appearing jejunum and ileum. TTP over normal-appearing terminal ileum.   Interval history- Kim Ballard, 83 year old female, who presents to symptom management clinic for evaluation of anemia. Anemia was found by routine CBC. Hematocrit found to be significantly lower then 3 months prior.  It has been present for several years but recently worsened. Associated signs & symptoms: chronic stable abdominal pain, intermittent dizziness/lightheadedness and fatigue. Previous work-up as above. Previous treatments have included IV venofer and PPI. Today, patient is stable. Continues to have symptoms of intermittent abdominal pain, dizziness/lightheadedness and fatigue. Denies any additional  neurologic complaints. Denies recent fevers or illnesses. Denies any easy bleeding or bruising. Reports good appetite and denies weight loss. Denies chest pain. Denies any nausea or vomiting. Denies urinary complaints. Patient offers no further specific complaints today.  ECOG FS:1 - Symptomatic but completely ambulatory  Review of systems- Review of Systems  Constitutional:  Positive for malaise/fatigue. Negative  for chills, fever and weight loss.       Weight gain  HENT: Negative for congestion, ear pain and tinnitus.   Eyes: Negative.  Negative for blurred vision and double vision.  Respiratory: Negative.  Negative for cough, sputum production and shortness of breath.   Cardiovascular: Negative.  Negative for chest pain, palpitations and leg swelling.  Gastrointestinal: Positive for abdominal pain, constipation and diarrhea. Negative for blood in stool, nausea and vomiting.       History of acid reflux and IBS  Genitourinary: Negative for dysuria, frequency and urgency.  Musculoskeletal: Negative for back pain and falls.  Skin: Negative.  Negative for rash.  Neurological: Positive for dizziness (Intermittent) and weakness. Negative for headaches.  Endo/Heme/Allergies: Negative.  Does not bruise/bleed easily.  Psychiatric/Behavioral: Negative.  Negative for depression. The patient is not nervous/anxious and does not have insomnia.      Current treatment- s/p IV ferritin infusion. Last given 10/07/16.   Allergies  Allergen Reactions  . Biaxin [Clarithromycin] Other (See Comments)    GI UPSET   . Brompheniramine Maleate   . Ceclor [Cefaclor]   . Ciprofloxacin   . Clinoril [Sulindac]   . Codeine Sulfate   . Diovan [Valsartan]   . Doxycycline   . Erythromycin   . Esomeprazole   . Fluoxetine Other (See Comments)    UNKNOWN  . Guaifenesin & Derivatives   . Levaquin [Levofloxacin In D5w]   . Lisinopril   . Lodine [Etodolac]   . Macrodantin [Nitrofurantoin Macrocrystal]   . Medrol [Methylprednisolone]   . Mobic [Meloxicam]   . Motrin [Ibuprofen]   . Nexium [Esomeprazole Magnesium]   . Nitrofurantoin Other (See Comments)  . Norvasc [Amlodipine Besylate]   . Omnicef [Cefdinir]   . Penicillins   . Prozac [Fluoxetine Hcl] Other (See Comments)    Makes her feel crazy   . Pseudoephedrine   . Reglan [Metoclopramide]   . Seldane [Terfenadine]   .  Sulfa Antibiotics Other (See Comments)  . Toprol Xl [Metoprolol Tartrate]   . Triamterene   . Tussionex Pennkinetic Er [Hydrocod Polst-Cpm Polst Er]   . Vibramycin [Doxycycline Calcium]     Past Medical History:  Diagnosis Date  . Allergic state   . Anemia   . Arthritis   . Cancer ([place])    skin cancers  . Chronic cystitis with hematuria   . Depression   . Diabetes mellitus without complication ([place])   . Fibrocystic disease of both breasts   . Fibromyalgia   . GERD (gastroesophageal reflux disease)   . Hypertension   . IBS (irritable bowel syndrome)   . Nausea   . Skin cancer   . Wears dentures    full upper and lower    Past Surgical History:  Procedure Laterality Date  . ABDOMINAL HYSTERECTOMY    . APPENDECTOMY    . BREAST BIOPSY Right 2001   surgical bx   . CATARACT EXTRACTION W/PHACO Right 03/08/2017   Procedure: CATARACT EXTRACTION PHACO AND INTRAOCULAR LENS PLACEMENT ([place]) RIGHT DIABETIC TORIC;  Surgeon: Leandrew Koyanagi, MD;  Location: [facility];  Service: Ophthalmology;  Laterality: Right;  Diabetic - oral meds  . CATARACT EXTRACTION W/PHACO Left 04/17/2017   Procedure: CATARACT EXTRACTION PHACO AND INTRAOCULAR LENS PLACEMENT ([place]) LEFT DIABETIC;  Surgeon: Leandrew Koyanagi, MD;  Location: [place];  Service: Ophthalmology;  Laterality: Left;  diabetic-oral meds  . CHOLECYSTECTOMY    . COLONOSCOPY    . COLONOSCOPY WITH PROPOFOL N/A 08/27/2015  Procedure: COLONOSCOPY WITH PROPOFOL;  Surgeon: Manya Silvas, MD;  Location[facility] ENDOSCOPY;  Service: Endoscopy;  Laterality: N/A;  . COLONOSCOPY WITH PROPOFOL N/A 05/02/2016   Procedure: COLONOSCOPY WITH PROPOFOL;  Surgeon: Manya Silvas, MD;  Location[facility] ENDOSCOPY;  Service: Endoscopy;  Laterality: N/A;  . ESOPHAGOGASTRODUODENOSCOPY    . ESOPHAGOGASTRODUODENOSCOPY (EGD) WITH PROPOFOL N/A 08/27/2015   Procedure: ESOPHAGOGASTRODUODENOSCOPY (EGD) WITH PROPOFOL;  Surgeon: Manya Silvas,  MD;  Location: [facility] ENDOSCOPY;  Service: Endoscopy;  Laterality: N/A;  . HERNIA REPAIR      Social History   Socioeconomic History  . Marital status: Married    Spouse name: Not on file  . Number of children: Not on file  . Years of education: Not on file  . Highest education level: Not on file  Occupational History  . Not on file  Social Needs  . Financial resource strain: Not on file  . Food insecurity:    Worry: Not on file    Inability: Not on file  . Transportation needs:    Medical: Not on file    Non-medical: Not on file  Tobacco Use  . Smoking status: Former Smoker    Last attempt to quit: 1960    Years since quitting: 59.7  . Smokeless tobacco: Never Used  Substance and Sexual Activity  . Alcohol use: No  . Drug use: No  . Sexual activity: Never  Lifestyle  . Physical activity:    Days per week: Not on file    Minutes per session: Not on file  . Stress: Not on file  Relationships  . Social connections:    Talks on phone: Not on file    Gets together: Not on file    Attends religious service: Not on file    Active member of club or organization: Not on file    Attends meetings of clubs or organizations: Not on file    Relationship status: Not on file  . Intimate partner violence:    Fear of current or ex partner: Not on file    Emotionally abused: Not on file    Physically abused: Not on file    Forced sexual activity: Not on file  Other Topics Concern  . Not on file  Social History Narrative  . Not on file    Family History  Problem Relation Age of Onset  . Hypertension Mother   . Ovarian cancer Paternal Grandmother   . COPD Father   . Hyperlipidemia Father   . Kidney disease Father   . Diabetes Maternal Grandmother      Current Outpatient Medications:  .  clidinium-chlordiazePOXIDE (LIBRAX) 5-2.5 MG capsule, Take 1 capsule by mouth., Disp: , Rfl:  .  CRANBERRY EXTRACT PO, Take by mouth., Disp: , Rfl:  .  fluticasone (FLONASE) 50 MCG/ACT  nasal spray, Place into both nostrils daily., Disp: , Rfl:  .  gemfibrozil (LOPID) 600 MG tablet, Take 600 mg by mouth 2 (two) times daily before a meal., Disp: , Rfl:  .  glipiZIDE (GLUCOTROL XL) 2.5 MG 24 hr tablet, Take 2.5 mg by mouth daily with breakfast., Disp: , Rfl:  .  hydrochlorothiazide (HYDRODIURIL) 25 MG tablet, Take 25 mg by mouth daily., Disp: , Rfl:  .  iron polysaccharides (NIFEREX) 150 MG capsule, Take 150 mg by mouth daily., Disp: , Rfl:  .  Lactobacillus Rhamnosus, GG, ([employer] PROBIOTIC, LACTOBACILLUS, PO), Take by mouth., Disp: , Rfl:  .  metFORMIN (GLUCOPHAGE) 500 MG tablet, Take  500 mg by mouth 2 (two) times daily with a meal., Disp: , Rfl:  .  Multiple Vitamins-Minerals (MULTIVITAMIN WITH MINERALS) tablet, Take 1 tablet by mouth daily., Disp: , Rfl:  .  OMEGA-3 FATTY ACIDS PO, Take by mouth., Disp: , Rfl:  .  pantoprazole (PROTONIX) 40 MG tablet, Take 40 mg by mouth daily., Disp: , Rfl:  .  sucralfate (CARAFATE) 1 g tablet, Take 1 g by mouth 4 (four) times daily -  with meals and at bedtime., Disp: , Rfl:  .  vitamin B-12 (CYANOCOBALAMIN) 1000 MCG tablet, Take 1,000 mcg by mouth daily., Disp: , Rfl:  .  vitamin E 100 UNIT capsule, Take by mouth daily., Disp: , Rfl:  No current facility-administered medications for this visit.   Facility-Administered Medications Ordered in Other Visits:  .  iron sucrose (VENOFER) injection 200 mg, 200 mg, Intravenous, Once, Lequita Asal, MD  Physical exam:  Vitals:   03/08/18 1037  BP: (!) 163/83  Pulse: 63  Resp: 18  Temp: (!) 97.3 F (36.3 C)  TempSrc: Tympanic  Weight: 150 lb (68 kg)   Physical Exam  Constitutional: She is oriented to person, place, and time. Vital signs are normal. She appears well-developed and well-nourished.  HENT:  Head: Normocephalic and atraumatic.  Eyes: Pupils are equal, round, and reactive to light.  Neck: Normal range of motion.  Cardiovascular: Normal rate, regular rhythm and normal heart  sounds.  No murmur heard. Pulmonary/Chest: Effort normal and breath sounds normal. She has no wheezes.  Abdominal: Soft. Normal appearance and bowel sounds are normal. She exhibits no distension and no ascites. There is tenderness in the right upper quadrant.  Musculoskeletal: Normal range of motion. She exhibits no edema.  Neurological: She is alert and oriented to person, place, and time.  Skin: Skin is warm and dry. No rash noted.  Psychiatric: Judgment normal.  Nursing note and vitals reviewed.    CMP Latest Ref Rng & Units 06/16/2016  Glucose 65 - 99 mg/dL 254(H)  BUN 6 - 20 mg/dL 20  Creatinine 0.44 - 1.00 mg/dL 0.81  Sodium 135 - 145 mmol/L 136  Potassium 3.5 - 5.1 mmol/L 3.9  Chloride 101 - 111 mmol/L 103  CO2 22 - 32 mmol/L 27  Calcium 8.9 - 10.3 mg/dL 9.4  Total Protein 6.5 - 8.1 g/dL 6.8  Total Bilirubin 0.3 - 1.2 mg/dL 0.5  Alkaline Phos 38 - 126 U/L 68  AST 15 - 41 U/L 10(L)  ALT 14 - 54 U/L 16   CBC Latest Ref Rng & Units 03/08/2018  WBC 3.6 - 11.0 K/uL 4.6  Hemoglobin 12.0 - 16.0 g/dL 11.1(L)  Hematocrit 35.0 - 47.0 % 32.3(L)  Platelets 150 - 440 K/uL 143(L)    No images are attached to the encounter.  No results found.  Assessment and plan- Patient is a 83 y.o. female who presents for assessment after abnormal 5-month labs.  1.  Iron deficiency anemia/splenomegaly: S/p 4 IV Venofer infusions.  Last given in May 2018.  Prior to this visit, labs have been holding stable.  Repeat labs from yesterday reveal a significant increase in hematocrit and hemoglobin. Iron stores 55, iron saturations 16%. Patient denies any bleeding admits to occasional " blood streaks" on toilet paper after bowel movements.  Denies melena or hematochezia.  Given labs have improved, and she denies any bleeding or any new concerns we will have her return to clinic as scheduled to see Dr. Mike Gip in November. Dr. Alcus Dad  notified of assessment and findings today. Not on oral iron d/t  constipation. Most recent CT scan from 2017 did not reveal any splenomegaly.    2.  Right upper quadrant tenderness: Followed by Dr. Vira Agar GI. Has hx of adenomatous colon polyps, gerd and chronic diarrhea. New complaint of early satiety.  Continued to be concerned about possible adhesions from her mesh implant.  Had CT abdomen/pelvis in 2017 for similar concerns that did not reveal any issues.  She has opted to hold off on further CT imaging at this time but was agreeable to [facility].  Completed on 01/03/2018.  It did not reveal any new acute issues or issues with mesh.  Area of tenderness was reproduced with imaging revealing normal-appearing terminal ileum.    3. Intermittent Diarrhea and constipation: Had recent stool study which has long-standing history of chronic fluctuating diarrhea and constipation and GERD.  Currently on Librax PRN and probiotic.  Recently prescribed Bentyl. Had stool study showing constipation.  Instructed OTC 1-2 capfuls MiraLAX daily to assist with softer and easier bowel movements.  No new complaints at this time.    4. Thrombocytopenia: Stable.  Improving. Platelets today are 143.   Visit Diagnosis 1. Iron deficiency anemia due to chronic blood loss   2. Thrombocytopenia (HCC)   3. Other iron deficiency anemia     Patient expressed understanding and was in agreement with this plan. She also understands that She can call clinic at any time with any questions, concerns, or complaints.   Greater than 50% was spent in counseling and coordination of care with this patient including but not limited to discussion of the relevant topics above (See A&P) including, but not limited to diagnosis and management of acute and chronic medical conditions.    Thank you for allowing me to participate in the care of this very pleasant patient.   Faythe Casa, NP 03/09/2018 9:46 AM

## 2018-03-08 NOTE — Telephone Encounter (Signed)
Assessed patient today. She is doing well. Labs from yesterday have improved dramatically especially hematocrit. She has intermittent abdominal pain that is not new. Has history of IBS and abdominal adhesion; followed by Dr. Tiffany Kocher. Has follow-up in the next month. Denies rectal bleeding but has occasional blood after she wipes.Hx of hemorrhoids.  States on occasion she has diarrhea/constipation. This is also not new. Platlet count has improved. No dizziness or weakness. Labs appear stable. She is scheduled for follow-up with you in November.   Faythe Casa, NP 03/08/2018 12:34 PM

## 2018-03-28 DIAGNOSIS — J029 Acute pharyngitis, unspecified: Secondary | ICD-10-CM | POA: Diagnosis not present

## 2018-04-30 DIAGNOSIS — R300 Dysuria: Secondary | ICD-10-CM | POA: Diagnosis not present

## 2018-05-10 DIAGNOSIS — D696 Thrombocytopenia, unspecified: Secondary | ICD-10-CM | POA: Diagnosis not present

## 2018-05-10 DIAGNOSIS — D649 Anemia, unspecified: Secondary | ICD-10-CM | POA: Diagnosis not present

## 2018-05-10 DIAGNOSIS — Z87440 Personal history of urinary (tract) infections: Secondary | ICD-10-CM | POA: Diagnosis not present

## 2018-05-10 DIAGNOSIS — K580 Irritable bowel syndrome with diarrhea: Secondary | ICD-10-CM | POA: Diagnosis not present

## 2018-05-10 DIAGNOSIS — D61818 Other pancytopenia: Secondary | ICD-10-CM | POA: Diagnosis not present

## 2018-05-10 DIAGNOSIS — E119 Type 2 diabetes mellitus without complications: Secondary | ICD-10-CM | POA: Diagnosis not present

## 2018-05-10 DIAGNOSIS — I10 Essential (primary) hypertension: Secondary | ICD-10-CM | POA: Diagnosis not present

## 2018-05-16 DIAGNOSIS — E1165 Type 2 diabetes mellitus with hyperglycemia: Secondary | ICD-10-CM | POA: Diagnosis not present

## 2018-05-16 DIAGNOSIS — D649 Anemia, unspecified: Secondary | ICD-10-CM | POA: Diagnosis not present

## 2018-05-16 DIAGNOSIS — I10 Essential (primary) hypertension: Secondary | ICD-10-CM | POA: Diagnosis not present

## 2018-05-16 DIAGNOSIS — D696 Thrombocytopenia, unspecified: Secondary | ICD-10-CM | POA: Diagnosis not present

## 2018-05-16 DIAGNOSIS — N390 Urinary tract infection, site not specified: Secondary | ICD-10-CM | POA: Diagnosis not present

## 2018-05-17 DIAGNOSIS — R1031 Right lower quadrant pain: Secondary | ICD-10-CM | POA: Diagnosis not present

## 2018-05-17 DIAGNOSIS — I10 Essential (primary) hypertension: Secondary | ICD-10-CM | POA: Diagnosis not present

## 2018-05-17 DIAGNOSIS — R1032 Left lower quadrant pain: Secondary | ICD-10-CM | POA: Diagnosis not present

## 2018-05-17 DIAGNOSIS — E1165 Type 2 diabetes mellitus with hyperglycemia: Secondary | ICD-10-CM | POA: Diagnosis not present

## 2018-05-17 DIAGNOSIS — N390 Urinary tract infection, site not specified: Secondary | ICD-10-CM | POA: Diagnosis not present

## 2018-05-17 DIAGNOSIS — R5383 Other fatigue: Secondary | ICD-10-CM | POA: Diagnosis not present

## 2018-05-17 DIAGNOSIS — Z0000 Encounter for general adult medical examination without abnormal findings: Secondary | ICD-10-CM | POA: Diagnosis not present

## 2018-05-17 DIAGNOSIS — D696 Thrombocytopenia, unspecified: Secondary | ICD-10-CM | POA: Diagnosis not present

## 2018-05-17 DIAGNOSIS — R5381 Other malaise: Secondary | ICD-10-CM | POA: Diagnosis not present

## 2018-05-22 ENCOUNTER — Inpatient Hospital Stay: Payer: Medicare HMO | Attending: Hematology and Oncology | Admitting: Hematology and Oncology

## 2018-05-22 ENCOUNTER — Inpatient Hospital Stay: Payer: Medicare HMO

## 2018-05-22 ENCOUNTER — Other Ambulatory Visit: Payer: Self-pay | Admitting: Hematology and Oncology

## 2018-05-22 VITALS — BP 172/78 | HR 74 | Temp 95.9°F | Resp 18 | Wt 154.3 lb

## 2018-05-22 VITALS — BP 172/77 | HR 80

## 2018-05-22 DIAGNOSIS — Z87891 Personal history of nicotine dependence: Secondary | ICD-10-CM | POA: Insufficient documentation

## 2018-05-22 DIAGNOSIS — D500 Iron deficiency anemia secondary to blood loss (chronic): Secondary | ICD-10-CM

## 2018-05-22 DIAGNOSIS — D509 Iron deficiency anemia, unspecified: Secondary | ICD-10-CM

## 2018-05-22 DIAGNOSIS — M545 Low back pain: Secondary | ICD-10-CM

## 2018-05-22 DIAGNOSIS — D696 Thrombocytopenia, unspecified: Secondary | ICD-10-CM | POA: Insufficient documentation

## 2018-05-22 DIAGNOSIS — E119 Type 2 diabetes mellitus without complications: Secondary | ICD-10-CM

## 2018-05-22 MED ORDER — SODIUM CHLORIDE 0.9 % IV SOLN
Freq: Once | INTRAVENOUS | Status: AC
  Administered 2018-05-22: 10:00:00 via INTRAVENOUS
  Filled 2018-05-22: qty 250

## 2018-05-22 MED ORDER — SODIUM CHLORIDE 0.9 % IV SOLN: 200.0000 mg | INTRAVENOUS | Status: DC

## 2018-05-22 MED ORDER — IRON SUCROSE 20 MG/ML IV SOLN
200.0000 mg | Freq: Once | INTRAVENOUS | Status: AC
  Administered 2018-05-22: 200 mg via INTRAVENOUS
  Filled 2018-05-22: qty 5

## 2018-05-22 NOTE — Progress Notes (Signed)
[facility] day:  05/22/2018  Chief Complaint: Kim Ballard is a 83 y.o. female with iron deficiency anemia who is seen for  5 month assessment.  HPI:  The patient was last seen in medical oncology clinic on 12/05/2017.  At that time,  she continued to be fatigued.  She had an interval episode of diverticulitis that did not require treatment.  Exam was stable.  Hematocrit was 32.7 with a hemoglobin 11.5.  Platelets were 128,000.  WBC was normal.  Ferritin was 74.   CBC has been followed: 03/07/2018: Hematocrit 29.5, hemoglobin 10.2, MCV 93.1, platelets 135,000, WBC 4300 with an ANC of 2700.  Ferritin was 55. 05/10/2018:  Hematocrit 32.1, hemoglobin 10.7, MCV 96.1, platelets 126,0000, WBC 6300 with an ANC of 4770. Creatinine was 0.9.  LFTs were normal.  Ferritin was 38.  TSH was 1.642.  During the interim, patient is fatigued. She states, "all I want to do is sleep all of the time". Patient currently on a course of ceftin for an E.coli UTI. Patient denies bleeding; no hematochezia, melena, or gross hematuria. She remains on oral iron once daily. Patient denies that she has experienced any B symptoms. She denies any interval infections.   Patient advises that she maintains an adequate appetite. She is eating well. Weight today is 154 lb 5 oz (70 kg), which compared to her last visit to the clinic, represents a 4 pound increase.   Patient complains of pain rated 10/10 (lower back) in the clinic today. Patient radiates into her BILATERAL lower extremities. She states, "I need to see some kind of orthopedic specialist".    Past Medical History:  Diagnosis Date  . Allergic state   . Anemia   . Arthritis   . Cancer ([place])    skin cancers  . Chronic cystitis with hematuria   . Depression   . Diabetes mellitus without complication ([place])   . Fibrocystic disease of both breasts   . Fibromyalgia   . GERD (gastroesophageal reflux disease)   .  Hypertension   . IBS (irritable bowel syndrome)   . Nausea   . Skin cancer   . Wears dentures    full upper and lower    Past Surgical History:  Procedure Laterality Date  . ABDOMINAL HYSTERECTOMY    . APPENDECTOMY    . BREAST BIOPSY Right 2001   surgical bx   . CATARACT EXTRACTION W/PHACO Right 03/08/2017   Procedure: CATARACT EXTRACTION PHACO AND INTRAOCULAR LENS PLACEMENT ([place]) RIGHT DIABETIC TORIC;  Surgeon: Leandrew Koyanagi, MD;  Location: [facility];  Service: Ophthalmology;  Laterality: Right;  Diabetic - oral meds  . CATARACT EXTRACTION W/PHACO Left 04/17/2017   Procedure: CATARACT EXTRACTION PHACO AND INTRAOCULAR LENS PLACEMENT ([place]) LEFT DIABETIC;  Surgeon: Leandrew Koyanagi, MD;  Location: [facility];  Service: Ophthalmology;  Laterality: Left;  diabetic-oral meds  . CHOLECYSTECTOMY    . COLONOSCOPY    . COLONOSCOPY WITH PROPOFOL N/A 08/27/2015   Procedure: COLONOSCOPY WITH PROPOFOL;  Surgeon: Manya Silvas, MD;  Location: [facility] ENDOSCOPY;  Service: Endoscopy;  Laterality: N/A;  . COLONOSCOPY WITH PROPOFOL N/A 05/02/2016   Procedure: COLONOSCOPY WITH PROPOFOL;  Surgeon: Manya Silvas, MD;  Location: [facility] ENDOSCOPY;  Service: Endoscopy;  Laterality: N/A;  . ESOPHAGOGASTRODUODENOSCOPY    . ESOPHAGOGASTRODUODENOSCOPY (EGD) WITH PROPOFOL N/A 08/27/2015   Procedure: ESOPHAGOGASTRODUODENOSCOPY (EGD) WITH PROPOFOL;  Surgeon: Manya Silvas, MD;  Location: [facility] ENDOSCOPY;  Service: Endoscopy;  Laterality: N/A;  . HERNIA REPAIR      Family History  Problem Relation Age of Onset  . Hypertension Mother   . Ovarian cancer Paternal Grandmother   . COPD Father   . Hyperlipidemia Father   . Kidney disease Father   . Diabetes Maternal Grandmother     Social History:  reports that she quit smoking about 60 years ago. She has never used smokeless tobacco. She reports that she does not drink alcohol or use drugs.  She lives in [place].  Her husband was  bedridden for 3 years. He had Parkinson's and dementia.  He died in 2016-10-12.  She was his primary caregiver.  The patient is alone today.  Allergies:  Allergies  Allergen Reactions  . Biaxin [Clarithromycin] Other (See Comments)    GI UPSET   . Brompheniramine Maleate   . Ceclor [Cefaclor]   . Ciprofloxacin   . Clinoril [Sulindac]   . Codeine Sulfate   . Diovan [Valsartan]   . Doxycycline   . Erythromycin   . Esomeprazole   . Fluoxetine Other (See Comments)    UNKNOWN  . Guaifenesin & Derivatives   . Levaquin [Levofloxacin In D5w]   . Lisinopril   . Lodine [Etodolac]   . Macrodantin [Nitrofurantoin Macrocrystal]   . Medrol [Methylprednisolone]   . Mobic [Meloxicam]   . Motrin [Ibuprofen]   . Nexium [Esomeprazole Magnesium]   . Nitrofurantoin Other (See Comments)  . Norvasc [Amlodipine Besylate]   . Omnicef [Cefdinir]   . Penicillins   . Prozac [Fluoxetine Hcl] Other (See Comments)    Makes her feel crazy   . Pseudoephedrine   . Reglan [Metoclopramide]   . Seldane [Terfenadine]   . Sulfa Antibiotics Other (See Comments)  . Toprol Xl [Metoprolol Tartrate]   . Triamterene   . Tussionex Pennkinetic Er [Hydrocod Polst-Cpm Polst Er]   . Vibramycin [Doxycycline Calcium]     Current Medications: Current Outpatient Medications  Medication Sig Dispense Refill  . Ascorbic Acid (VITAMIN C) 1000 MG tablet Take 1,000 mg by mouth 2 times daily at 12 noon and 4 pm.    . cefUROXime (CEFTIN) 250 MG tablet Take 250 mg by mouth 2 times daily at 12 noon and 4 pm.    . CRANBERRY EXTRACT PO Take by mouth.    . fluticasone (FLONASE) 50 MCG/ACT nasal spray Place into both nostrils daily.    Marland Kitchen gemfibrozil (LOPID) 600 MG tablet Take 600 mg by mouth 2 (two) times daily before a meal.    . glipiZIDE (GLUCOTROL XL) 2.5 MG 24 hr tablet Take 2.5 mg by mouth daily with breakfast.    . hydrochlorothiazide (HYDRODIURIL) 25 MG tablet Take 25 mg by mouth daily.    . iron polysaccharides (NIFEREX)  150 MG capsule Take 150 mg by mouth daily.    . Lactobacillus Rhamnosus, GG, ([employer] PROBIOTIC, LACTOBACILLUS, PO) Take by mouth.    . metFORMIN (GLUCOPHAGE) 500 MG tablet Take 500 mg by mouth 2 (two) times daily with a meal.    . Multiple Vitamins-Minerals (MULTIVITAMIN WITH MINERALS) tablet Take 1 tablet by mouth daily.    . OMEGA-3 FATTY ACIDS PO Take by mouth.    . pantoprazole (PROTONIX) 40 MG tablet Take 40 mg by mouth daily.    . vitamin B-12 (CYANOCOBALAMIN) 1000 MCG tablet Take 1,000 mcg by mouth daily.    . vitamin E 100 UNIT capsule Take by mouth daily.    . clidinium-chlordiazePOXIDE (LIBRAX) 5-2.5 MG capsule Take 1  capsule by mouth.     No current facility-administered medications for this visit.    Facility-Administered Medications Ordered in Other Visits  Medication Dose Route Frequency Provider Last Rate Last Dose  . iron sucrose (VENOFER) injection 200 mg  200 mg Intravenous Once Lequita Asal, MD        Review of Systems  Constitutional: Positive for malaise/fatigue. Negative for diaphoresis, fever and weight loss (up 4 pounds).  HENT: Negative.   Eyes: Negative.   Respiratory: Negative for cough, hemoptysis, sputum production and shortness of breath.   Cardiovascular: Negative for chest pain, palpitations, orthopnea, leg swelling and PND.  Gastrointestinal: Positive for abdominal pain (bloating; IBS with known diverticulosis). Negative for blood in stool, constipation, diarrhea, melena, nausea and vomiting.  Genitourinary: Positive for dysuria and frequency. Negative for hematuria and urgency.       Current UTI - on course of ceftin  Musculoskeletal: Positive for back pain (+) radicular into BLE and myalgias (fibromyalgia). Negative for falls and joint pain.  Skin: Negative for itching and rash.  Neurological: Negative for dizziness, tremors, weakness and headaches.  Endo/Heme/Allergies: Does not bruise/bleed easily.       Diabetes  Psychiatric/Behavioral:  Negative for depression, memory loss and suicidal ideas. The patient is not nervous/anxious and does not have insomnia (HYPERsomnia).   All other systems reviewed and are negative.  Performance status (ECOG): 1 - Symptomatic but completely ambulatory  Vital Signs BP (!) 172/78 (BP Location: Left Arm, Patient Position: Sitting)   Pulse 74   Temp (!) 95.9 F (35.5 C) (Tympanic)   Resp 18   Wt 154 lb 5 oz (70 kg)   BMI 23.81 kg/m   Physical Exam  Constitutional: She is oriented to person, place, and time and well-developed, well-nourished, and in no distress. No distress.  HENT:  Head: Normocephalic and atraumatic.  Mouth/Throat: Oropharynx is clear and moist and mucous membranes are normal. No oropharyngeal exudate.  Short black hair.  Eyes: Pupils are equal, round, and reactive to light. Conjunctivae and EOM are normal. No scleral icterus.  Hazel eyes.  Neck: Normal range of motion. Neck supple. No JVD present.  Cardiovascular: Normal rate, regular rhythm, normal heart sounds and intact distal pulses. Exam reveals no gallop and no friction rub.  No murmur heard. Pulmonary/Chest: Effort normal and breath sounds normal. No respiratory distress. She has no wheezes. She has no rales.  Abdominal: Soft. Bowel sounds are normal. She exhibits no distension and no mass. There is no abdominal tenderness. There is no rebound and no guarding.  Musculoskeletal: Normal range of motion.        General: No tenderness or edema.  Lymphadenopathy:    She has no cervical adenopathy.  Neurological: She is alert and oriented to person, place, and time.  Skin: Skin is warm and dry. No rash noted. She is not diaphoretic. No erythema.  Psychiatric: Mood, affect and judgment normal.  Nursing note and vitals reviewed.   No visits with results within 3 Day(s) from this visit.  Latest known visit with results is:  Orders Only on 03/08/2018  Component Date Value Ref Range Status  . Iron 03/08/2018 69  28  - 170 ug/dL Final  . TIBC 03/08/2018 423  250 - 450 ug/dL Final  . Saturation Ratios 03/08/2018 16  10.4 - 31.8 % Final  . UIBC 03/08/2018 354  ug/dL Final   Performed at [facility], [address] [place], [place] [address]  . Sed Rate 03/08/2018  51* 0 - 30 mm/hr Final   Performed at [facility], [address] [place], [place] [address]  . WBC 03/08/2018 4.6  3.6 - 11.0 K/uL Final  . RBC 03/08/2018 3.45* 3.80 - 5.20 MIL/uL Final  . Hemoglobin 03/08/2018 11.1* 12.0 - 16.0 g/dL Final  . HCT 03/08/2018 32.3* 35.0 - 47.0 % Final  . MCV 03/08/2018 93.5  80.0 - 100.0 fL Final  . MCH 03/08/2018 32.0  26.0 - 34.0 pg Final  . MCHC 03/08/2018 34.2  32.0 - 36.0 g/dL Final  . RDW 03/08/2018 13.2  11.5 - 14.5 % Final  . Platelets 03/08/2018 143* 150 - 440 K/uL Final  . Neutrophils Relative % 03/08/2018 68  % Final  . Neutro Abs 03/08/2018 3.1  1.4 - 6.5 K/uL Final  . Lymphocytes Relative 03/08/2018 18  % Final  . Lymphs Abs 03/08/2018 0.8* 1.0 - 3.6 K/uL Final  . Monocytes Relative 03/08/2018 8  % Final  . Monocytes Absolute 03/08/2018 0.4  0.2 - 0.9 K/uL Final  . Eosinophils Relative 03/08/2018 5  % Final  . Eosinophils Absolute 03/08/2018 0.2  0 - 0.7 K/uL Final  . Basophils Relative 03/08/2018 1  % Final  . Basophils Absolute 03/08/2018 0.0  0 - 0.1 K/uL Final   Performed at [facility], [address], [place], [place] [address]  . Retic Ct Pct 03/08/2018 2.1  0.4 - 3.1 % Final  . RBC. 03/08/2018 3.33* 3.80 - 5.20 MIL/uL Final  . Retic Count, Absolute 03/08/2018 69.9  19.0 - 183.0 K/uL Final   Performed at [facility], [address] [place], [place] [address]    Assessment:  Kim Ballard is a 83 y.o. female with iron deficiency anemia.  She has had anemia since 12/24/2014.  Hematocrit previously ranged between 32.3 - 34. She was on oral iron. Iron causes constipation.    EGD on 08/27/2015 revealed LA grade A reflux esophagitis and  erythematous mucosa in the gastric body and antrum.  Gastric biopsy revealed healing mucosal injury and focal intestinal metaplasia.   There was iron pill gastritis.    Colonoscopy on 08/27/2015 revealed 1 medium polyp in the cecum (tubulovillous adenoma).  Colonoscopy on 05/02/2016 revealed 2 diminutive polyps in the ascending colon and in the cecum.  Pathology revealed tubular adenoma negative for high grade dysplasia and malignancy.   Diet is fair.  She eats chicken or beef every other day. She eats green leafy vegetables. She denies any cravings.  Workup on 01/19/2016 revealed a hematocrit 29.9, hemoglobin 10.5, MCV 88.3, platelets 152,000, white count 4100 with an ANC of 2600. Reticulocyte count was 2.8%.  The following studies were normal:  ANA, rheumatoid factor, SPEP, free light chain ratio, [place], B12, folate.  Ferritin was 38 (low) . Iron studies included a saturation of 14% and TIBC of 462 (high) consistent with iron deficiency anemia.  She received Venofer 200 mg IV x 2 (03/25/2016 and 04/01/2016) and x 2 (09/30/2016 and 10/07/2016).  Ferritin has been followed: 36 on 09/21/2016, 128 on 12/15/2016, 94 on 03/23/2017, 92 on 06/15/2017, 90 on 09/14/2017, 74 on 12/05/2017, 55 on 03/07/2018, and 38 on 05/10/2018.  She has had mild thrombocytopenia possibly secondary to ITP.  Platelet count has ranged between 105,000 and 152,000 from 11/12/2013 to present without trend.  Abdominal and pelvic CT scan on 10/28/2015 revealed no hepatosplenomegaly.  Bilateral screening mammogram on 06/14/2016 revealed no evidence of malignancy.  Symptomatically, she is fatigued. She notes that  all she wants to do is sleep. Chronic fibromyalgia with (+) lower back pain. Currently on course of oral ceftin for UTI. No B symptoms. Patient denies bleeding; no hematochezia, melena, or gross hematuria. Exam is unremarkable.   Plan: 1. Review interval labs from PCP. 2. Iron deficiency anemia  Hemoglobin 10.7,  hematocrit 32.1, MCV 96.1.  Ferritin has drifted down to 28 ng/mL.   She has been on oral iron, however her PCP has suggested iron infusions.   Discuss follow up with GI for consideration of VCE.  Schedule appointment with Dr. Vira Agar.   Patient refuses to be seen by NP Jerelene Redden).   Venofer 200 mg IV today, then weekly x 2 (total of 3 infusions).  3. Thrombocytopenia  Platelets slightly low at 126,000 on 05/10/2018.   Discuss consideration of bone marrow sampling if counts decline significantly.  Recommending medical surveillance at this juncture.  4. RTC in 3 months for labs (CBC with diff, ferritin). 5. RTC in 6 months for MD assessment and labs (CBC with diff, ferritin, iron studies).   Honor Loh, NP  05/22/2018,1:36 PM   I saw and evaluated the patient, participating in the key portions of the service and reviewing pertinent diagnostic studies and records.  I reviewed the nurse practitioner's note and agree with the findings and the plan.  The assessment and plan were discussed with the patient.  Several questions were asked by the patient and answered.   Nolon Stalls, MD 05/22/2018,1:36 PM

## 2018-05-22 NOTE — Progress Notes (Signed)
Patient here today as referral back from Dr. Ginette Pitman.  Patient c/o back pain.  States she feels bloated all the time  Eating and sleeping well.  Some nausea.  On antibiotics now for kidney infection.

## 2018-05-28 ENCOUNTER — Inpatient Hospital Stay: Payer: Medicare HMO

## 2018-05-29 ENCOUNTER — Ambulatory Visit: Payer: Medicare HMO

## 2018-06-05 ENCOUNTER — Inpatient Hospital Stay: Payer: Medicare HMO

## 2018-06-05 ENCOUNTER — Encounter: Payer: Self-pay | Admitting: Hematology and Oncology

## 2018-06-05 VITALS — BP 171/81 | HR 71 | Temp 98.4°F | Resp 18

## 2018-06-05 DIAGNOSIS — M545 Low back pain: Secondary | ICD-10-CM | POA: Diagnosis not present

## 2018-06-05 DIAGNOSIS — D508 Other iron deficiency anemias: Secondary | ICD-10-CM

## 2018-06-05 DIAGNOSIS — D696 Thrombocytopenia, unspecified: Secondary | ICD-10-CM | POA: Diagnosis not present

## 2018-06-05 DIAGNOSIS — Z87891 Personal history of nicotine dependence: Secondary | ICD-10-CM | POA: Diagnosis not present

## 2018-06-05 DIAGNOSIS — E119 Type 2 diabetes mellitus without complications: Secondary | ICD-10-CM | POA: Diagnosis not present

## 2018-06-05 DIAGNOSIS — D509 Iron deficiency anemia, unspecified: Secondary | ICD-10-CM | POA: Diagnosis not present

## 2018-06-05 MED ORDER — SODIUM CHLORIDE 0.9 % IV SOLN
Freq: Once | INTRAVENOUS | Status: AC
  Administered 2018-06-05: 14:00:00 via INTRAVENOUS
  Filled 2018-06-05: qty 250

## 2018-06-05 MED ORDER — IRON SUCROSE 20 MG/ML IV SOLN
200.0000 mg | Freq: Once | INTRAVENOUS | Status: AC
  Administered 2018-06-05: 200 mg via INTRAVENOUS
  Filled 2018-06-05: qty 10

## 2018-06-07 DIAGNOSIS — M5441 Lumbago with sciatica, right side: Secondary | ICD-10-CM | POA: Diagnosis not present

## 2018-06-07 DIAGNOSIS — M5136 Other intervertebral disc degeneration, lumbar region: Secondary | ICD-10-CM | POA: Diagnosis not present

## 2018-06-07 DIAGNOSIS — M533 Sacrococcygeal disorders, not elsewhere classified: Secondary | ICD-10-CM | POA: Diagnosis not present

## 2018-06-07 DIAGNOSIS — M76892 Other specified enthesopathies of left lower limb, excluding foot: Secondary | ICD-10-CM | POA: Diagnosis not present

## 2018-06-07 DIAGNOSIS — M47816 Spondylosis without myelopathy or radiculopathy, lumbar region: Secondary | ICD-10-CM | POA: Diagnosis not present

## 2018-06-07 DIAGNOSIS — M5442 Lumbago with sciatica, left side: Secondary | ICD-10-CM | POA: Diagnosis not present

## 2018-06-07 DIAGNOSIS — M7062 Trochanteric bursitis, left hip: Secondary | ICD-10-CM | POA: Diagnosis not present

## 2018-06-07 DIAGNOSIS — G8929 Other chronic pain: Secondary | ICD-10-CM | POA: Diagnosis not present

## 2018-06-11 ENCOUNTER — Ambulatory Visit: Payer: Medicare HMO | Admitting: Hematology and Oncology

## 2018-06-11 ENCOUNTER — Ambulatory Visit: Payer: Medicare HMO

## 2018-06-11 ENCOUNTER — Inpatient Hospital Stay: Payer: Medicare HMO | Attending: Hematology and Oncology

## 2018-06-11 ENCOUNTER — Inpatient Hospital Stay: Payer: Medicare HMO

## 2018-06-11 ENCOUNTER — Other Ambulatory Visit: Payer: Medicare HMO

## 2018-06-11 ENCOUNTER — Inpatient Hospital Stay (HOSPITAL_BASED_OUTPATIENT_CLINIC_OR_DEPARTMENT_OTHER): Payer: Medicare HMO | Admitting: Hematology and Oncology

## 2018-06-11 VITALS — BP 179/82 | HR 74 | Temp 96.8°F | Resp 18 | Wt 153.1 lb

## 2018-06-11 DIAGNOSIS — D696 Thrombocytopenia, unspecified: Secondary | ICD-10-CM

## 2018-06-11 DIAGNOSIS — Z87891 Personal history of nicotine dependence: Secondary | ICD-10-CM | POA: Diagnosis not present

## 2018-06-11 DIAGNOSIS — M545 Low back pain: Secondary | ICD-10-CM | POA: Diagnosis not present

## 2018-06-11 DIAGNOSIS — E119 Type 2 diabetes mellitus without complications: Secondary | ICD-10-CM | POA: Diagnosis not present

## 2018-06-11 DIAGNOSIS — D509 Iron deficiency anemia, unspecified: Secondary | ICD-10-CM | POA: Insufficient documentation

## 2018-06-11 DIAGNOSIS — D500 Iron deficiency anemia secondary to blood loss (chronic): Secondary | ICD-10-CM

## 2018-06-11 DIAGNOSIS — D508 Other iron deficiency anemias: Secondary | ICD-10-CM

## 2018-06-11 LAB — IRON AND TIBC
Iron: 113 ug/dL (ref 28–170)
Saturation Ratios: 25 % (ref 10.4–31.8)
TIBC: 457 ug/dL — ABNORMAL HIGH (ref 250–450)
UIBC: 344 ug/dL

## 2018-06-11 LAB — CBC WITH DIFFERENTIAL/PLATELET
Abs Immature Granulocytes: 0.03 10*3/uL (ref 0.00–0.07)
Basophils Absolute: 0.1 10*3/uL (ref 0.0–0.1)
Basophils Relative: 1 %
Eosinophils Absolute: 0.2 10*3/uL (ref 0.0–0.5)
Eosinophils Relative: 3 %
HCT: 32.5 % — ABNORMAL LOW (ref 36.0–46.0)
Hemoglobin: 10.9 g/dL — ABNORMAL LOW (ref 12.0–15.0)
Immature Granulocytes: 1 %
Lymphocytes Relative: 20 %
Lymphs Abs: 1 10*3/uL (ref 0.7–4.0)
MCH: 31.8 pg (ref 26.0–34.0)
MCHC: 33.5 g/dL (ref 30.0–36.0)
MCV: 94.8 fL (ref 80.0–100.0)
Monocytes Absolute: 0.4 10*3/uL (ref 0.1–1.0)
Monocytes Relative: 7 %
Neutro Abs: 3.4 10*3/uL (ref 1.7–7.7)
Neutrophils Relative %: 68 %
Platelets: 131 10*3/uL — ABNORMAL LOW (ref 150–400)
RBC: 3.43 MIL/uL — ABNORMAL LOW (ref 3.87–5.11)
RDW: 13.1 % (ref 11.5–15.5)
WBC: 5 10*3/uL (ref 4.0–10.5)
nRBC: 0 % (ref 0.0–0.2)

## 2018-06-11 LAB — FERRITIN: Ferritin: 161 ng/mL (ref 11–307)

## 2018-06-11 MED ORDER — IRON SUCROSE 20 MG/ML IV SOLN
200.0000 mg | Freq: Once | INTRAVENOUS | Status: AC
  Administered 2018-06-11: 200 mg via INTRAVENOUS
  Filled 2018-06-11: qty 10

## 2018-06-11 MED ORDER — SODIUM CHLORIDE 0.9 % IV SOLN
INTRAVENOUS | Status: DC
  Administered 2018-06-11: 15:00:00 via INTRAVENOUS
  Filled 2018-06-11: qty 250

## 2018-06-11 NOTE — Progress Notes (Signed)
Pt. Here for f/u. Denies any complaints at this time.

## 2018-06-11 NOTE — Progress Notes (Signed)
[place] [facility] day:  06/11/2018  Chief Complaint: Kim Ballard is a 84 y.o. female with iron deficiency anemia who is seen for 1 month assessment.  HPI:  The patient was last seen in medical oncology clinic on 05/22/2018.  At that time,  she was fatigued.  All she wanted to do was sleep. Chronic fibromyalgia with (+) lower back pain. She was on course of oral ceftin for UTI. No B symptoms. Patient denied bleeding; no hematochezia, melena, or gross hematuria. Exam was unremarkable.   Patient scheduled to receive Venofer x 3.  She received Venofer on 05/22/2018 and 06/05/2018.  During the interim, patient feeling a bit better following her iron infusions to date. No shortness of breath. Patient denies bleeding; no hematochezia, melena, or gross hematuria. Patient denies that she has experienced any B symptoms. She denies any interval infections.   She notes that she has been seen by orthopedics Mack Guise, MD) and had an injection in her hip. No change in her pain following the injection. Patient notes that she was advised that her issue was in her back and that further interventions would be discussed in the future.   Patient advises that she maintains an adequate appetite. She is eating well. Weight today is 153 lb 1.8 oz (69.4 kg), which compared to her last visit to the clinic, represents a 1 pound decrease.   Patient complains of pain rated 10/10 in the clinic today.   Past Medical History:  Diagnosis Date  . Allergic state   . Anemia   . Arthritis   . Cancer ([place])    skin cancers  . Chronic cystitis with hematuria   . Depression   . Diabetes mellitus without complication ([place])   . Fibrocystic disease of both breasts   . Fibromyalgia   . GERD (gastroesophageal reflux disease)   . Hypertension   . IBS (irritable bowel syndrome)   . Nausea   . Skin cancer   . Wears dentures    full upper and lower    Past Surgical History:  Procedure  Laterality Date  . ABDOMINAL HYSTERECTOMY    . APPENDECTOMY    . BREAST BIOPSY Right 2001   surgical bx   . CATARACT EXTRACTION W/PHACO Right 03/08/2017   Procedure: CATARACT EXTRACTION PHACO AND INTRAOCULAR LENS PLACEMENT ([place]) RIGHT DIABETIC TORIC;  Surgeon: Leandrew Koyanagi, MD;  Location: [facility];  Service: Ophthalmology;  Laterality: Right;  Diabetic - oral meds  . CATARACT EXTRACTION W/PHACO Left 04/17/2017   Procedure: CATARACT EXTRACTION PHACO AND INTRAOCULAR LENS PLACEMENT ([place]) LEFT DIABETIC;  Surgeon: Leandrew Koyanagi, MD;  Location: [facility];  Service: Ophthalmology;  Laterality: Left;  diabetic-oral meds  . CHOLECYSTECTOMY    . COLONOSCOPY    . COLONOSCOPY WITH PROPOFOL N/A 08/27/2015   Procedure: COLONOSCOPY WITH PROPOFOL;  Surgeon: Manya Silvas, MD;  Location[facility] ENDOSCOPY;  Service: Endoscopy;  Laterality: N/A;  . COLONOSCOPY WITH PROPOFOL N/A 05/02/2016   Procedure: COLONOSCOPY WITH PROPOFOL;  Surgeon: Manya Silvas, MD;  Location: [facility] ENDOSCOPY;  Service: Endoscopy;  Laterality: N/A;  . ESOPHAGOGASTRODUODENOSCOPY    . ESOPHAGOGASTRODUODENOSCOPY (EGD) WITH PROPOFOL N/A 08/27/2015   Procedure: ESOPHAGOGASTRODUODENOSCOPY (EGD) WITH PROPOFOL;  Surgeon: Manya Silvas, MD;  Location: [facility] ENDOSCOPY;  Service: Endoscopy;  Laterality: N/A;  . HERNIA REPAIR      Family History  Problem Relation Age of Onset  . Hypertension Mother   . Ovarian cancer Paternal Grandmother   .  COPD Father   . Hyperlipidemia Father   . Kidney disease Father   . Diabetes Maternal Grandmother     Social History:  reports that she quit smoking about 60 years ago. She has never used smokeless tobacco. She reports that she does not drink alcohol or use drugs.  She lives in [place].  Her husband was bedridden for 3 years. He had Parkinson's and dementia.  He died in 2016/10/21.  She was his primary caregiver.  The patient is alone today.  Allergies:  Allergies   Allergen Reactions  . Biaxin [Clarithromycin] Other (See Comments)    GI UPSET   . Brompheniramine Maleate   . Ceclor [Cefaclor]   . Ciprofloxacin   . Clinoril [Sulindac]   . Codeine Sulfate   . Diovan [Valsartan]   . Doxycycline   . Erythromycin   . Esomeprazole   . Fluoxetine Other (See Comments)    UNKNOWN  . Guaifenesin & Derivatives   . Levaquin [Levofloxacin In D5w]   . Lisinopril   . Lodine [Etodolac]   . Macrodantin [Nitrofurantoin Macrocrystal]   . Medrol [Methylprednisolone]   . Mobic [Meloxicam]   . Motrin [Ibuprofen]   . Nexium [Esomeprazole Magnesium]   . Nitrofurantoin Other (See Comments)  . Norvasc [Amlodipine Besylate]   . Omnicef [Cefdinir]   . Penicillins   . Prozac [Fluoxetine Hcl] Other (See Comments)    Makes her feel crazy   . Pseudoephedrine   . Reglan [Metoclopramide]   . Seldane [Terfenadine]   . Sulfa Antibiotics Other (See Comments)  . Toprol Xl [Metoprolol Tartrate]   . Triamterene   . Tussionex Pennkinetic Er [Hydrocod Polst-Cpm Polst Er]   . Vibramycin [Doxycycline Calcium]     Current Medications: Current Outpatient Medications  Medication Sig Dispense Refill  . Ascorbic Acid (VITAMIN C) 1000 MG tablet Take 1,000 mg by mouth 2 times daily at 12 noon and 4 pm.    . cholecalciferol (VITAMIN D3) 25 MCG (1000 UT) tablet Take 1,000 Units by mouth 2 (two) times daily.    . clidinium-chlordiazePOXIDE (LIBRAX) 5-2.5 MG capsule Take 1 capsule by mouth.    . Cranberry 405 MG CAPS Take 2 capsules by mouth 2 (two) times daily.     . ferrous sulfate 325 (65 FE) MG tablet Take 325 mg by mouth 2 (two) times daily with a meal.    . fluticasone (FLONASE) 50 MCG/ACT nasal spray Place into both nostrils daily.    Marland Kitchen gemfibrozil (LOPID) 600 MG tablet Take 600 mg by mouth 2 (two) times daily before a meal.    . glipiZIDE (GLUCOTROL XL) 2.5 MG 24 hr tablet Take 2.5 mg by mouth daily with breakfast.    . hydrochlorothiazide (HYDRODIURIL) 25 MG tablet Take  25 mg by mouth daily.    . Lactobacillus Rhamnosus, GG, (CVS PROBIOTIC, LACTOBACILLUS, PO) Take by mouth.    . metFORMIN (GLUCOPHAGE) 500 MG tablet Take 500 mg by mouth 2 (two) times daily with a meal.    . Multiple Vitamins-Minerals (MULTIVITAMIN WITH MINERALS) tablet Take 1 tablet by mouth daily.    . OMEGA-3 FATTY ACIDS PO Take by mouth.    . pantoprazole (PROTONIX) 40 MG tablet Take 40 mg by mouth daily.    . vitamin B-12 (CYANOCOBALAMIN) 1000 MCG tablet Take 1,000 mcg by mouth daily.    . vitamin E 100 UNIT capsule Take by mouth daily.     No current facility-administered medications for this visit.    Facility-Administered Medications Ordered  in Other Visits  Medication Dose Route Frequency Provider Last Rate Last Dose  . iron sucrose (VENOFER) injection 200 mg  200 mg Intravenous Once Lequita Asal, MD        Review of Systems  Constitutional: Positive for weight loss (1 pound). Negative for chills, diaphoresis, fever and malaise/fatigue.       Feeling a "bit better".  HENT: Negative.  Negative for congestion, ear pain, nosebleeds, sinus pain and sore throat.   Eyes: Negative.  Negative for blurred vision, double vision, photophobia and pain.  Respiratory: Negative.  Negative for cough, hemoptysis, sputum production and shortness of breath.   Cardiovascular: Negative.  Negative for chest pain, palpitations, orthopnea, leg swelling and PND.  Gastrointestinal: Negative for abdominal pain, blood in stool, constipation, diarrhea, melena, nausea and vomiting.       IBS.  Diverticulosis.  Genitourinary: Negative for dysuria, frequency and hematuria.  Musculoskeletal: Positive for back pain (+) radicular into BLE and myalgias (fibromyalgia). Negative for falls, joint pain (s/p hip injection) and neck pain.  Skin: Negative.  Negative for itching and rash.  Neurological: Negative.  Negative for dizziness, tremors, sensory change, speech change, focal weakness, weakness and headaches.   Endo/Heme/Allergies: Does not bruise/bleed easily.       Diabetes.  Psychiatric/Behavioral: Negative for depression and memory loss. The patient is not nervous/anxious and does not have insomnia.   All other systems reviewed and are negative.  Performance status (ECOG): 1  Vital Signs BP (!) 179/82 (BP Location: Left Arm, Patient Position: Sitting)   Pulse 74   Temp (!) 96.8 F (36 C) (Tympanic)   Resp 18   Wt 153 lb 1.8 oz (69.4 kg)   BMI 23.63 kg/m   Physical Exam  Constitutional: She is oriented to person, place, and time and well-developed, well-nourished, and in no distress. No distress.  HENT:  Head: Normocephalic and atraumatic.  Mouth/Throat: Oropharynx is clear and moist and mucous membranes are normal. No oropharyngeal exudate.  Short black hair.  Eyes: Pupils are equal, round, and reactive to light. Conjunctivae and EOM are normal. No scleral icterus.  Hazel eyes.  Neck: Normal range of motion. Neck supple. No JVD present.  Cardiovascular: Normal rate, regular rhythm, normal heart sounds and intact distal pulses. Exam reveals no gallop and no friction rub.  No murmur heard. Pulmonary/Chest: Effort normal and breath sounds normal. No respiratory distress. She has no wheezes. She has no rales.  Abdominal: Soft. Bowel sounds are normal. She exhibits no distension and no mass. There is no abdominal tenderness. There is no rebound.  Musculoskeletal: Normal range of motion.        General: No tenderness or edema.  Lymphadenopathy:    She has no cervical adenopathy.  Neurological: She is alert and oriented to person, place, and time. Gait normal.  Skin: Skin is warm and dry. No rash noted. She is not diaphoretic. No erythema. No pallor.  Psychiatric: Mood, affect and judgment normal.  Nursing note and vitals reviewed.   Appointment on 06/11/2018  Component Date Value Ref Range Status  . WBC 06/11/2018 5.0  4.0 - 10.5 K/uL Final  . RBC 06/11/2018 3.43* 3.87 - 5.11  MIL/uL Final  . Hemoglobin 06/11/2018 10.9* 12.0 - 15.0 g/dL Final  . HCT 06/11/2018 32.5* 36.0 - 46.0 % Final  . MCV 06/11/2018 94.8  80.0 - 100.0 fL Final  . MCH 06/11/2018 31.8  26.0 - 34.0 pg Final  . MCHC 06/11/2018 33.5  30.0 - 36.0  g/dL Final  . RDW 06/11/2018 13.1  11.5 - 15.5 % Final  . Platelets 06/11/2018 131* 150 - 400 K/uL Final  . nRBC 06/11/2018 0.0  0.0 - 0.2 % Final  . Neutrophils Relative % 06/11/2018 68  % Final  . Neutro Abs 06/11/2018 3.4  1.7 - 7.7 K/uL Final  . Lymphocytes Relative 06/11/2018 20  % Final  . Lymphs Abs 06/11/2018 1.0  0.7 - 4.0 K/uL Final  . Monocytes Relative 06/11/2018 7  % Final  . Monocytes Absolute 06/11/2018 0.4  0.1 - 1.0 K/uL Final  . Eosinophils Relative 06/11/2018 3  % Final  . Eosinophils Absolute 06/11/2018 0.2  0.0 - 0.5 K/uL Final  . Basophils Relative 06/11/2018 1  % Final  . Basophils Absolute 06/11/2018 0.1  0.0 - 0.1 K/uL Final  . Immature Granulocytes 06/11/2018 1  % Final  . Abs Immature Granulocytes 06/11/2018 0.03  0.00 - 0.07 K/uL Final   Performed at [facility], [address], [place], [place] [address]    Assessment:  Kim Ballard is a 84 y.o. female with iron deficiency anemia.  She has had anemia since 12/24/2014.  Hematocrit previously ranged between 32.3 - 34. She was on oral iron. Iron causes constipation.    EGD on 08/27/2015 revealed LA grade A reflux esophagitis and erythematous mucosa in the gastric body and antrum.  Gastric biopsy revealed healing mucosal injury and focal intestinal metaplasia.   There was iron pill gastritis.    Colonoscopy on 08/27/2015 revealed 1 medium polyp in the cecum (tubulovillous adenoma).  Colonoscopy on 05/02/2016 revealed 2 diminutive polyps in the ascending colon and in the cecum.  Pathology revealed tubular adenoma negative for high grade dysplasia and malignancy.   Diet is fair.  She eats chicken or beef every other day. She eats green leafy vegetables. She denies  any cravings.  Workup on 01/19/2016 revealed a hematocrit 29.9, hemoglobin 10.5, MCV 88.3, platelets 152,000, white count 4100 with an ANC of 2600. Reticulocyte count was 2.8%.  The following studies were normal:  ANA, rheumatoid factor, SPEP, free light chain ratio, [place], B12, folate.  Ferritin was 38 (low) . Iron studies included a saturation of 14% and TIBC of 462 (high) consistent with iron deficiency anemia.  She received Venofer 200 mg IV x 2 (03/25/2016 and 04/01/2016) and x 2 (09/30/2016 and 10/07/2016).  Ferritin has been followed: 36 on 09/21/2016, 128 on 12/15/2016, 94 on 03/23/2017, 92 on 06/15/2017, 90 on 09/14/2017, 74 on 12/05/2017, 55 on 03/07/2018, 38 on 05/10/2018, and 161 on 06/11/2018.  She has had mild thrombocytopenia felt secondary to ITP.  Platelet count has ranged between 105,000 and 152,000 from 11/12/2013 to present without trend.  Abdominal and pelvic CT scan on 10/28/2015 revealed no hepatosplenomegaly.  Bilateral screening mammogram on 06/14/2016 revealed no evidence of malignancy.  Symptomatically, she is having issues with back pain.  Exam is stable.  Hemoglobin is 10.9.  Platelets 131,000.  Plan: 1. Review interval labs from PCP. 2. Iron deficiency anemia Hemoglobin 10.9, hematocrit 32.5, MCV 94.9. Patient previously scheduled for 3 Venofer infusions. She has received 2 Venofer infusions to date. Venofer 200 mg IV today. Discuss plan for follow-up with Dr. Vira Agar. Consider capsule study. 3. Thrombocytopenia Platelet count is 131,000 (mildly low). Discuss prior plan for marrow if counts decline. Continue surveillance. 4. Discuss transition to [place]. 5. RTC in 3 months for labs (CBC with diff, BMP, SPEP, ferritin). 6. RTC in 6 months for MD assessment,  labs (CBC with diff, ferritin, iron studies), and +/- Venofer.   Honor Loh, NP  06/11/2018,2:16 PM   I saw and evaluated the patient, participating in the key portions of the service and reviewing  pertinent diagnostic studies and records.  I reviewed the nurse practitioner's note and agree with the findings and the plan.  The assessment and plan were discussed with the patient.  A few questions were asked by the patient and answered.   Nolon Stalls, MD 06/11/2018,2:16 PM

## 2018-06-12 ENCOUNTER — Ambulatory Visit: Payer: Medicare HMO

## 2018-06-14 ENCOUNTER — Encounter: Payer: Self-pay | Admitting: Urology

## 2018-06-14 ENCOUNTER — Ambulatory Visit (INDEPENDENT_AMBULATORY_CARE_PROVIDER_SITE_OTHER): Payer: Medicare HMO | Admitting: Urology

## 2018-06-14 VITALS — BP 174/82 | HR 84 | Ht 67.5 in | Wt 153.6 lb

## 2018-06-14 DIAGNOSIS — N390 Urinary tract infection, site not specified: Secondary | ICD-10-CM | POA: Diagnosis not present

## 2018-06-14 LAB — MICROSCOPIC EXAMINATION
Bacteria, UA: NONE SEEN
RBC, UA: NONE SEEN /hpf (ref 0–2)

## 2018-06-14 LAB — URINALYSIS, COMPLETE
Bilirubin, UA: NEGATIVE
Glucose, UA: NEGATIVE
Ketones, UA: NEGATIVE
Nitrite, UA: NEGATIVE
RBC, UA: NEGATIVE
Specific Gravity, UA: 1.02 (ref 1.005–1.030)
Urobilinogen, Ur: 0.2 mg/dL (ref 0.2–1.0)
pH, UA: 5.5 (ref 5.0–7.5)

## 2018-06-14 NOTE — Patient Instructions (Signed)

## 2018-06-15 ENCOUNTER — Encounter: Payer: Self-pay | Admitting: Urology

## 2018-06-15 NOTE — Progress Notes (Signed)
06/14/2018 7:35 AM   Kim Ballard December 10, 1934 [id number]  Referring provider: Tracie Harrier, MD [address] [facility] [place], [place] [address]  Chief Complaint  Patient presents with  . Recurrent UTI    HPI: 84 year old female presents for follow-up of recurrent UTIs.  She was initially seen by me in July 2019.  She was seen here in September 2019 with complaints of frequency, dysuria and lower abdominal pain.  Urine culture was negative.  She was seen at [facility] with similar symptoms and urine culture was again negative.  She was seen back at [place] on 12/11 and urine culture grew 25,000 colonies of E. coli.  She was treated with Ceftin.  She presently has no voiding complaints.  She is on cranberry extract.  She has multiple antibiotic sensitivities.   PMH: Past Medical History:  Diagnosis Date  . Allergic state   . Anemia   . Arthritis   . Cancer ([place])    skin cancers  . Chronic cystitis with hematuria   . Depression   . Diabetes mellitus without complication ([place])   . Fibrocystic disease of both breasts   . Fibromyalgia   . GERD (gastroesophageal reflux disease)   . Hypertension   . IBS (irritable bowel syndrome)   . Nausea   . Skin cancer   . Wears dentures    full upper and lower    Surgical History: Past Surgical History:  Procedure Laterality Date  . ABDOMINAL HYSTERECTOMY    . APPENDECTOMY    . BREAST BIOPSY Right 2001   surgical bx   . CATARACT EXTRACTION W/PHACO Right 03/08/2017   Procedure: CATARACT EXTRACTION PHACO AND INTRAOCULAR LENS PLACEMENT ([place]) RIGHT DIABETIC TORIC;  Surgeon: Leandrew Koyanagi, MD;  Location: [facility];  Service: Ophthalmology;  Laterality: Right;  Diabetic - oral meds  . CATARACT EXTRACTION W/PHACO Left 04/17/2017   Procedure: CATARACT EXTRACTION PHACO AND INTRAOCULAR LENS PLACEMENT ([place]) LEFT DIABETIC;  Surgeon: Leandrew Koyanagi, MD;  Location: [facility];  Service:  Ophthalmology;  Laterality: Left;  diabetic-oral meds  . CHOLECYSTECTOMY    . COLONOSCOPY    . COLONOSCOPY WITH PROPOFOL N/A 08/27/2015   Procedure: COLONOSCOPY WITH PROPOFOL;  Surgeon: Manya Silvas, MD;  Location: [facility] ENDOSCOPY;  Service: Endoscopy;  Laterality: N/A;  . COLONOSCOPY WITH PROPOFOL N/A 05/02/2016   Procedure: COLONOSCOPY WITH PROPOFOL;  Surgeon: Manya Silvas, MD;  Location: [facility] ENDOSCOPY;  Service: Endoscopy;  Laterality: N/A;  . ESOPHAGOGASTRODUODENOSCOPY    . ESOPHAGOGASTRODUODENOSCOPY (EGD) WITH PROPOFOL N/A 08/27/2015   Procedure: ESOPHAGOGASTRODUODENOSCOPY (EGD) WITH PROPOFOL;  Surgeon: Manya Silvas, MD;  Location: [facility] END[facility]COPY;  Service: Endoscopy;  Laterality: N/A;  . HERNIA REPAIR      Home Medications:  Allergies as of 06/14/2018      Reactions   Biaxin [clarithromycin] Other (See Comments)   GI UPSET   Brompheniramine Maleate    Ceclor [cefaclor]    Ciprofloxacin    Clinoril [sulindac]    Codeine Sulfate    Diovan [valsartan]    Doxycycline    Erythromycin    Esomeprazole    Fluoxetine Other (See Comments)   UNKNOWN   Guaifenesin & Derivatives    Levaquin [levofloxacin In D5w]    Lisinopril    Lodine [etodolac]    Macrodantin [nitrofurantoin Macrocrystal]    Medrol [methylprednisolone]    Mobic [meloxicam]    Motrin [ibuprofen]    Nexium [esomeprazole Magnesium]    Nitrofurantoin Other (See Comments)  Norvasc [amlodipine Besylate]    Omnicef [cefdinir]    Penicillins    Prozac [fluoxetine Hcl] Other (See Comments)   Makes her feel crazy   Pseudoephedrine    Reglan [metoclopramide]    Seldane [terfenadine]    Sulfa Antibiotics Other (See Comments)   Toprol Xl [metoprolol Tartrate]    Triamterene    Tussionex Pennkinetic Er [hydrocod Polst-cpm Polst Er]    Vibramycin [doxycycline Calcium]       Medication List       Accurate as of June 14, 2018 11:59 PM. Always use your most recent med list.        cholecalciferol 25  MCG (1000 UT) tablet Commonly known as:  VITAMIN D3 Take 1,000 Units by mouth 2 (two) times daily.   clidinium-chlordiazePOXIDE 5-2.5 MG capsule Commonly known as:  LIBRAX Take 1 capsule by mouth.   Cranberry 405 MG Caps Take 2 capsules by mouth 2 (two) times daily.   CVS PROBIOTIC (LACTOBACILLUS) PO Take by mouth.   ferrous sulfate 325 (65 FE) MG tablet Take 325 mg by mouth 2 (two) times daily with a meal.   FLONASE 50 MCG/ACT nasal spray Generic drug:  fluticasone Place into both nostrils daily.   gemfibrozil 600 MG tablet Commonly known as:  LOPID Take 600 mg by mouth 2 (two) times daily before a meal.   glipiZIDE 2.5 MG 24 hr tablet Commonly known as:  GLUCOTROL XL Take 2.5 mg by mouth daily with breakfast.   hydrochlorothiazide 25 MG tablet Commonly known as:  HYDRODIURIL Take 25 mg by mouth daily.   metFORMIN 500 MG tablet Commonly known as:  GLUCOPHAGE Take 500 mg by mouth 2 (two) times daily with a meal.   multivitamin with minerals tablet Take 1 tablet by mouth daily.   OMEGA-3 FATTY ACIDS PO Take by mouth.   pantoprazole 40 MG tablet Commonly known as:  PROTONIX Take 40 mg by mouth daily.   vitamin B-12 1000 MCG tablet Commonly known as:  CYANOCOBALAMIN Take 1,000 mcg by mouth daily.   vitamin C 1000 MG tablet Take 1,000 mg by mouth 2 times daily at 12 noon and 4 pm.   vitamin E 100 UNIT capsule Take by mouth daily.       Allergies:  Allergies  Allergen Reactions  . Biaxin [Clarithromycin] Other (See Comments)    GI UPSET   . Brompheniramine Maleate   . Ceclor [Cefaclor]   . Ciprofloxacin   . Clinoril [Sulindac]   . Codeine Sulfate   . Diovan [Valsartan]   . Doxycycline   . Erythromycin   . Esomeprazole   . Fluoxetine Other (See Comments)    UNKNOWN  . Guaifenesin & Derivatives   . Levaquin [Levofloxacin In D5w]   . Lisinopril   . Lodine [Etodolac]   . Macrodantin [Nitrofurantoin Macrocrystal]   . Medrol [Methylprednisolone]    . Mobic [Meloxicam]   . Motrin [Ibuprofen]   . Nexium [Esomeprazole Magnesium]   . Nitrofurantoin Other (See Comments)  . Norvasc [Amlodipine Besylate]   . Omnicef [Cefdinir]   . Penicillins   . Prozac [Fluoxetine Hcl] Other (See Comments)    Makes her feel crazy   . Pseudoephedrine   . Reglan [Metoclopramide]   . Seldane [Terfenadine]   . Sulfa Antibiotics Other (See Comments)  . Toprol Xl [Metoprolol Tartrate]   . Triamterene   . Tussionex Pennkinetic Er [Hydrocod Polst-Cpm Polst Er]   . Vibramycin [Doxycycline Calcium]     Family History: Family History  Problem Relation Age of Onset  . Hypertension Mother   . Ovarian cancer Paternal Grandmother   . COPD Father   . Hyperlipidemia Father   . Kidney disease Father   . Diabetes Maternal Grandmother     Social History:  reports that she quit smoking about 60 years ago. She has never used smokeless tobacco. She reports that she does not drink alcohol or use drugs.  ROS: UROLOGY Frequent Urination?: No Hard to postpone urination?: No Burning/pain with urination?: No Get up at night to urinate?: Yes Leakage of urine?: No Urine stream starts and stops?: No Trouble starting stream?: No Do you have to strain to urinate?: Yes Blood in urine?: No Urinary tract infection?: No Sexually transmitted disease?: No Injury to kidneys or bladder?: No Painful intercourse?: No Weak stream?: No Currently pregnant?: No Vaginal bleeding?: No Last menstrual period?: Postmenopausal  Gastrointestinal Nausea?: No Vomiting?: No Indigestion/heartburn?: No Diarrhea?: No Constipation?: No  Constitutional Fever: No Night sweats?: No Weight loss?: No Fatigue?: Yes  Skin Skin rash/lesions?: No Itching?: No  Eyes Blurred vision?: No Double vision?: No  Ears/Nose/Throat Sore throat?: No Sinus problems?: Yes  Hematologic/Lymphatic Swollen glands?: No Easy bruising?: No  Cardiovascular Leg swelling?: No Chest pain?:  No  Respiratory Cough?: No Shortness of breath?: No  Endocrine Excessive thirst?: No  Musculoskeletal Back pain?: Yes Joint pain?: No  Neurological Headaches?: No Dizziness?: No  Psychologic Depression?: No Anxiety?: No  Physical Exam: BP (!) 174/82 (BP Location: Left Arm, Patient Position: Sitting, Cuff Size: Large)   Pulse 84   Ht 5' 7.5" (1.715 m)   Wt 153 lb 9.6 oz (69.7 kg)   BMI 23.70 kg/m   Constitutional:  Alert and oriented, No acute distress. HEENT: [place] AT, moist mucus membranes.  Trachea midline, no masses. Cardiovascular: No clubbing, cyanosis, or edema. Respiratory: Normal respiratory effort, no increased work of breathing. GI: Abdomen is soft, nontender, nondistended, no abdominal masses GU: No CVA tenderness Lymph: No cervical or inguinal lymphadenopathy. Skin: No rashes, bruises or suspicious lesions. Neurologic: Grossly intact, no focal deficits, moving all 4 extremities. Psychiatric: Normal mood and affect.  Laboratory Data:  Urinalysis Dipstick 2+ leukocytes microscopy 11-30 WBC   Assessment & Plan:    1. Recurrent UTI Urinalysis today does show pyuria however she has no significant symptoms.  Would avoid treatment asymptomatic bacteriuria.  A urine culture was ordered in the event she develops symptoms.    Abbie Sons, [place] [address] [place], [place] [address] [phone]

## 2018-06-18 ENCOUNTER — Telehealth: Payer: Self-pay

## 2018-06-18 ENCOUNTER — Other Ambulatory Visit: Payer: Medicare HMO

## 2018-06-18 DIAGNOSIS — N390 Urinary tract infection, site not specified: Secondary | ICD-10-CM

## 2018-06-18 NOTE — Telephone Encounter (Signed)
Called pt informed her that there was not enough urine for a cx from her initial specimen, asked pt to come into office for another urine sample for cx only. Pt gave verbal understanding.

## 2018-06-19 DIAGNOSIS — E1142 Type 2 diabetes mellitus with diabetic polyneuropathy: Secondary | ICD-10-CM | POA: Diagnosis not present

## 2018-06-19 DIAGNOSIS — L851 Acquired keratosis [keratoderma] palmaris et plantaris: Secondary | ICD-10-CM | POA: Diagnosis not present

## 2018-06-20 ENCOUNTER — Other Ambulatory Visit: Payer: Self-pay | Admitting: Internal Medicine

## 2018-06-20 DIAGNOSIS — Z1231 Encounter for screening mammogram for malignant neoplasm of breast: Secondary | ICD-10-CM

## 2018-06-21 DIAGNOSIS — M546 Pain in thoracic spine: Secondary | ICD-10-CM | POA: Diagnosis not present

## 2018-06-21 DIAGNOSIS — M9904 Segmental and somatic dysfunction of sacral region: Secondary | ICD-10-CM | POA: Diagnosis not present

## 2018-06-21 DIAGNOSIS — M545 Low back pain: Secondary | ICD-10-CM | POA: Diagnosis not present

## 2018-06-21 DIAGNOSIS — M9902 Segmental and somatic dysfunction of thoracic region: Secondary | ICD-10-CM | POA: Diagnosis not present

## 2018-06-21 DIAGNOSIS — M461 Sacroiliitis, not elsewhere classified: Secondary | ICD-10-CM | POA: Diagnosis not present

## 2018-06-21 DIAGNOSIS — M9903 Segmental and somatic dysfunction of lumbar region: Secondary | ICD-10-CM | POA: Diagnosis not present

## 2018-06-21 LAB — CULTURE, URINE COMPREHENSIVE

## 2018-06-22 ENCOUNTER — Ambulatory Visit: Payer: Medicare HMO | Admitting: Hematology and Oncology

## 2018-06-25 DIAGNOSIS — M461 Sacroiliitis, not elsewhere classified: Secondary | ICD-10-CM | POA: Diagnosis not present

## 2018-06-25 DIAGNOSIS — M545 Low back pain: Secondary | ICD-10-CM | POA: Diagnosis not present

## 2018-06-25 DIAGNOSIS — M9902 Segmental and somatic dysfunction of thoracic region: Secondary | ICD-10-CM | POA: Diagnosis not present

## 2018-06-25 DIAGNOSIS — M9903 Segmental and somatic dysfunction of lumbar region: Secondary | ICD-10-CM | POA: Diagnosis not present

## 2018-06-25 DIAGNOSIS — M546 Pain in thoracic spine: Secondary | ICD-10-CM | POA: Diagnosis not present

## 2018-06-25 DIAGNOSIS — M9904 Segmental and somatic dysfunction of sacral region: Secondary | ICD-10-CM | POA: Diagnosis not present

## 2018-06-27 DIAGNOSIS — R1031 Right lower quadrant pain: Secondary | ICD-10-CM | POA: Diagnosis not present

## 2018-06-27 DIAGNOSIS — Z01812 Encounter for preprocedural laboratory examination: Secondary | ICD-10-CM | POA: Diagnosis not present

## 2018-06-27 DIAGNOSIS — R1032 Left lower quadrant pain: Secondary | ICD-10-CM | POA: Diagnosis not present

## 2018-06-28 ENCOUNTER — Other Ambulatory Visit: Payer: Self-pay | Admitting: Unknown Physician Specialty

## 2018-06-28 DIAGNOSIS — M9904 Segmental and somatic dysfunction of sacral region: Secondary | ICD-10-CM | POA: Diagnosis not present

## 2018-06-28 DIAGNOSIS — M9903 Segmental and somatic dysfunction of lumbar region: Secondary | ICD-10-CM | POA: Diagnosis not present

## 2018-06-28 DIAGNOSIS — M545 Low back pain: Secondary | ICD-10-CM | POA: Diagnosis not present

## 2018-06-28 DIAGNOSIS — R1032 Left lower quadrant pain: Principal | ICD-10-CM

## 2018-06-28 DIAGNOSIS — M461 Sacroiliitis, not elsewhere classified: Secondary | ICD-10-CM | POA: Diagnosis not present

## 2018-06-28 DIAGNOSIS — M9902 Segmental and somatic dysfunction of thoracic region: Secondary | ICD-10-CM | POA: Diagnosis not present

## 2018-06-28 DIAGNOSIS — M546 Pain in thoracic spine: Secondary | ICD-10-CM | POA: Diagnosis not present

## 2018-06-28 DIAGNOSIS — R1031 Right lower quadrant pain: Secondary | ICD-10-CM

## 2018-07-02 DIAGNOSIS — M9902 Segmental and somatic dysfunction of thoracic region: Secondary | ICD-10-CM | POA: Diagnosis not present

## 2018-07-02 DIAGNOSIS — M9903 Segmental and somatic dysfunction of lumbar region: Secondary | ICD-10-CM | POA: Diagnosis not present

## 2018-07-02 DIAGNOSIS — M461 Sacroiliitis, not elsewhere classified: Secondary | ICD-10-CM | POA: Diagnosis not present

## 2018-07-02 DIAGNOSIS — M546 Pain in thoracic spine: Secondary | ICD-10-CM | POA: Diagnosis not present

## 2018-07-02 DIAGNOSIS — M9904 Segmental and somatic dysfunction of sacral region: Secondary | ICD-10-CM | POA: Diagnosis not present

## 2018-07-02 DIAGNOSIS — M545 Low back pain: Secondary | ICD-10-CM | POA: Diagnosis not present

## 2018-07-12 DIAGNOSIS — M9903 Segmental and somatic dysfunction of lumbar region: Secondary | ICD-10-CM | POA: Diagnosis not present

## 2018-07-12 DIAGNOSIS — M461 Sacroiliitis, not elsewhere classified: Secondary | ICD-10-CM | POA: Diagnosis not present

## 2018-07-12 DIAGNOSIS — M545 Low back pain: Secondary | ICD-10-CM | POA: Diagnosis not present

## 2018-07-12 DIAGNOSIS — M9904 Segmental and somatic dysfunction of sacral region: Secondary | ICD-10-CM | POA: Diagnosis not present

## 2018-07-12 DIAGNOSIS — M9902 Segmental and somatic dysfunction of thoracic region: Secondary | ICD-10-CM | POA: Diagnosis not present

## 2018-07-12 DIAGNOSIS — M546 Pain in thoracic spine: Secondary | ICD-10-CM | POA: Diagnosis not present

## 2018-07-16 ENCOUNTER — Ambulatory Visit
Admission: RE | Admit: 2018-07-16 | Discharge: 2018-07-16 | Disposition: A | Discharge status: Home / Self Care | Payer: Medicare HMO | Source: Ambulatory Visit | Attending: Unknown Physician Specialty | Admitting: Unknown Physician Specialty

## 2018-07-16 DIAGNOSIS — R1031 Right lower quadrant pain: Secondary | ICD-10-CM

## 2018-07-16 DIAGNOSIS — R1032 Left lower quadrant pain: Secondary | ICD-10-CM | POA: Diagnosis not present

## 2018-07-16 DIAGNOSIS — R197 Diarrhea, unspecified: Secondary | ICD-10-CM | POA: Diagnosis not present

## 2018-07-16 LAB — POCT I-STAT CREATININE: Creatinine, Ser: 0.8 mg/dL (ref 0.44–1.00)

## 2018-07-16 MED ORDER — IOPAMIDOL (ISOVUE-300) INJECTION 61%
100.0000 mL | Freq: Once | INTRAVENOUS | Status: AC | PRN
  Administered 2018-07-16: 100 mL via INTRAVENOUS

## 2018-07-24 ENCOUNTER — Ambulatory Visit
Admission: RE | Admit: 2018-07-24 | Discharge: 2018-07-24 | Disposition: A | Discharge status: Home / Self Care | Payer: Medicare HMO | Source: Ambulatory Visit | Attending: Internal Medicine | Admitting: Internal Medicine

## 2018-07-24 DIAGNOSIS — Z1231 Encounter for screening mammogram for malignant neoplasm of breast: Secondary | ICD-10-CM | POA: Insufficient documentation

## 2018-07-30 DIAGNOSIS — M461 Sacroiliitis, not elsewhere classified: Secondary | ICD-10-CM | POA: Diagnosis not present

## 2018-07-30 DIAGNOSIS — M545 Low back pain: Secondary | ICD-10-CM | POA: Diagnosis not present

## 2018-07-30 DIAGNOSIS — M9902 Segmental and somatic dysfunction of thoracic region: Secondary | ICD-10-CM | POA: Diagnosis not present

## 2018-07-30 DIAGNOSIS — M9903 Segmental and somatic dysfunction of lumbar region: Secondary | ICD-10-CM | POA: Diagnosis not present

## 2018-07-30 DIAGNOSIS — M546 Pain in thoracic spine: Secondary | ICD-10-CM | POA: Diagnosis not present

## 2018-07-30 DIAGNOSIS — M9904 Segmental and somatic dysfunction of sacral region: Secondary | ICD-10-CM | POA: Diagnosis not present

## 2018-08-21 ENCOUNTER — Other Ambulatory Visit: Payer: Medicare HMO

## 2018-08-26 ENCOUNTER — Encounter: Payer: Self-pay | Admitting: Hematology and Oncology

## 2018-09-10 ENCOUNTER — Inpatient Hospital Stay: Payer: Medicare HMO

## 2018-09-10 ENCOUNTER — Ambulatory Visit: Payer: Medicare HMO | Admitting: Hematology and Oncology

## 2018-09-10 DIAGNOSIS — D696 Thrombocytopenia, unspecified: Secondary | ICD-10-CM | POA: Diagnosis not present

## 2018-09-10 DIAGNOSIS — Z0000 Encounter for general adult medical examination without abnormal findings: Secondary | ICD-10-CM | POA: Diagnosis not present

## 2018-09-10 DIAGNOSIS — K219 Gastro-esophageal reflux disease without esophagitis: Secondary | ICD-10-CM | POA: Diagnosis not present

## 2018-09-10 DIAGNOSIS — E1165 Type 2 diabetes mellitus with hyperglycemia: Secondary | ICD-10-CM | POA: Diagnosis not present

## 2018-09-10 DIAGNOSIS — I10 Essential (primary) hypertension: Secondary | ICD-10-CM | POA: Diagnosis not present

## 2018-09-10 DIAGNOSIS — R1011 Right upper quadrant pain: Secondary | ICD-10-CM | POA: Diagnosis not present

## 2018-09-10 DIAGNOSIS — F411 Generalized anxiety disorder: Secondary | ICD-10-CM | POA: Diagnosis not present

## 2018-09-10 DIAGNOSIS — R1031 Right lower quadrant pain: Secondary | ICD-10-CM | POA: Diagnosis not present

## 2018-09-10 DIAGNOSIS — R5383 Other fatigue: Secondary | ICD-10-CM | POA: Diagnosis not present

## 2018-09-10 DIAGNOSIS — R5381 Other malaise: Secondary | ICD-10-CM | POA: Diagnosis not present

## 2018-09-10 DIAGNOSIS — N390 Urinary tract infection, site not specified: Secondary | ICD-10-CM | POA: Diagnosis not present

## 2018-09-10 DIAGNOSIS — R1032 Left lower quadrant pain: Secondary | ICD-10-CM | POA: Diagnosis not present

## 2018-09-10 DIAGNOSIS — D61818 Other pancytopenia: Secondary | ICD-10-CM | POA: Diagnosis not present

## 2018-09-11 ENCOUNTER — Telehealth: Payer: Self-pay

## 2018-09-11 ENCOUNTER — Other Ambulatory Visit: Payer: Self-pay | Admitting: Hematology and Oncology

## 2018-09-11 ENCOUNTER — Other Ambulatory Visit: Payer: Self-pay

## 2018-09-11 ENCOUNTER — Inpatient Hospital Stay: Payer: Medicare HMO | Attending: Hematology and Oncology

## 2018-09-11 DIAGNOSIS — D696 Thrombocytopenia, unspecified: Secondary | ICD-10-CM | POA: Insufficient documentation

## 2018-09-11 DIAGNOSIS — F411 Generalized anxiety disorder: Secondary | ICD-10-CM | POA: Diagnosis not present

## 2018-09-11 DIAGNOSIS — R1011 Right upper quadrant pain: Secondary | ICD-10-CM | POA: Diagnosis not present

## 2018-09-11 DIAGNOSIS — E1165 Type 2 diabetes mellitus with hyperglycemia: Secondary | ICD-10-CM | POA: Diagnosis not present

## 2018-09-11 DIAGNOSIS — D500 Iron deficiency anemia secondary to blood loss (chronic): Secondary | ICD-10-CM

## 2018-09-11 DIAGNOSIS — D509 Iron deficiency anemia, unspecified: Secondary | ICD-10-CM | POA: Insufficient documentation

## 2018-09-11 DIAGNOSIS — D61818 Other pancytopenia: Secondary | ICD-10-CM | POA: Diagnosis not present

## 2018-09-11 LAB — CBC WITH DIFFERENTIAL/PLATELET
Abs Immature Granulocytes: 0.02 10*3/uL (ref 0.00–0.07)
Basophils Absolute: 0 10*3/uL (ref 0.0–0.1)
Basophils Relative: 1 %
Eosinophils Absolute: 0.2 10*3/uL (ref 0.0–0.5)
Eosinophils Relative: 5 %
HCT: 31.7 % — ABNORMAL LOW (ref 36.0–46.0)
Hemoglobin: 10.9 g/dL — ABNORMAL LOW (ref 12.0–15.0)
Immature Granulocytes: 1 %
Lymphocytes Relative: 22 %
Lymphs Abs: 0.9 10*3/uL (ref 0.7–4.0)
MCH: 32.5 pg (ref 26.0–34.0)
MCHC: 34.4 g/dL (ref 30.0–36.0)
MCV: 94.6 fL (ref 80.0–100.0)
Monocytes Absolute: 0.3 10*3/uL (ref 0.1–1.0)
Monocytes Relative: 6 %
Neutro Abs: 2.8 10*3/uL (ref 1.7–7.7)
Neutrophils Relative %: 65 %
Platelets: 122 10*3/uL — ABNORMAL LOW (ref 150–400)
RBC: 3.35 MIL/uL — ABNORMAL LOW (ref 3.87–5.11)
RDW: 12.8 % (ref 11.5–15.5)
WBC: 4.2 10*3/uL (ref 4.0–10.5)
nRBC: 0 % (ref 0.0–0.2)

## 2018-09-11 LAB — BASIC METABOLIC PANEL
Anion gap: 8 (ref 5–15)
BUN: 23 mg/dL (ref 8–23)
CO2: 26 mmol/L (ref 22–32)
Calcium: 9.4 mg/dL (ref 8.9–10.3)
Chloride: 107 mmol/L (ref 98–111)
Creatinine, Ser: 1.22 mg/dL — ABNORMAL HIGH (ref 0.44–1.00)
GFR calc Af Amer: 48 mL/min — ABNORMAL LOW (ref >60)
GFR calc non Af Amer: 42 mL/min — ABNORMAL LOW (ref >60)
Glucose, Bld: 204 mg/dL — ABNORMAL HIGH (ref 70–99)
Potassium: 3.9 mmol/L (ref 3.5–5.1)
Sodium: 141 mmol/L (ref 135–145)

## 2018-09-11 LAB — IRON AND TIBC
Iron: 75 ug/dL (ref 28–170)
Saturation Ratios: 17 % (ref 10.4–31.8)
TIBC: 435 ug/dL (ref 250–450)
UIBC: 360 ug/dL

## 2018-09-11 LAB — FERRITIN: Ferritin: 98 ng/mL (ref 11–307)

## 2018-09-11 NOTE — Telephone Encounter (Signed)
Spoke with Mr Bittinger to inform her that her creat. Levels has increased from 0.8 to 1.22. The patient does express that she has been having a lot of pain noted to her side and has been seen her PCP Dr Ginette Pitman for care. I have also routed Ms Lamont labs to their office. The patient was agreeable and understanding.

## 2018-09-12 LAB — PROTEIN ELECTROPHORESIS, SERUM
A/G Ratio: 1.6 (ref 0.7–1.7)
Albumin ELP: 3.8 g/dL (ref 2.9–4.4)
Alpha-1-Globulin: 0.2 g/dL (ref 0.0–0.4)
Alpha-2-Globulin: 0.9 g/dL (ref 0.4–1.0)
Beta Globulin: 0.9 g/dL (ref 0.7–1.3)
Gamma Globulin: 0.4 g/dL (ref 0.4–1.8)
Globulin, Total: 2.4 g/dL (ref 2.2–3.9)
Total Protein ELP: 6.2 g/dL (ref 6.0–8.5)

## 2018-09-18 DIAGNOSIS — I10 Essential (primary) hypertension: Secondary | ICD-10-CM | POA: Diagnosis not present

## 2018-09-18 DIAGNOSIS — D696 Thrombocytopenia, unspecified: Secondary | ICD-10-CM | POA: Diagnosis not present

## 2018-09-18 DIAGNOSIS — R140 Abdominal distension (gaseous): Secondary | ICD-10-CM | POA: Diagnosis not present

## 2018-09-18 DIAGNOSIS — Z0000 Encounter for general adult medical examination without abnormal findings: Secondary | ICD-10-CM | POA: Diagnosis not present

## 2018-09-18 DIAGNOSIS — D649 Anemia, unspecified: Secondary | ICD-10-CM | POA: Diagnosis not present

## 2018-09-18 DIAGNOSIS — E1165 Type 2 diabetes mellitus with hyperglycemia: Secondary | ICD-10-CM | POA: Diagnosis not present

## 2018-09-18 DIAGNOSIS — R1011 Right upper quadrant pain: Secondary | ICD-10-CM | POA: Diagnosis not present

## 2018-11-05 DIAGNOSIS — H60543 Acute eczematoid otitis externa, bilateral: Secondary | ICD-10-CM | POA: Diagnosis not present

## 2018-11-05 DIAGNOSIS — H6123 Impacted cerumen, bilateral: Secondary | ICD-10-CM | POA: Diagnosis not present

## 2018-11-06 DIAGNOSIS — R1011 Right upper quadrant pain: Secondary | ICD-10-CM | POA: Diagnosis not present

## 2018-11-06 DIAGNOSIS — R110 Nausea: Secondary | ICD-10-CM | POA: Diagnosis not present

## 2018-11-06 DIAGNOSIS — R150 Incomplete defecation: Secondary | ICD-10-CM | POA: Diagnosis not present

## 2018-11-06 DIAGNOSIS — K5909 Other constipation: Secondary | ICD-10-CM | POA: Diagnosis not present

## 2018-11-06 DIAGNOSIS — R6881 Early satiety: Secondary | ICD-10-CM | POA: Diagnosis not present

## 2018-11-21 ENCOUNTER — Other Ambulatory Visit: Payer: Medicare HMO

## 2018-11-21 ENCOUNTER — Ambulatory Visit: Payer: Medicare HMO | Admitting: Hematology and Oncology

## 2018-11-27 DIAGNOSIS — K3184 Gastroparesis: Secondary | ICD-10-CM | POA: Diagnosis not present

## 2018-11-27 DIAGNOSIS — R6881 Early satiety: Secondary | ICD-10-CM | POA: Diagnosis not present

## 2018-11-27 DIAGNOSIS — R110 Nausea: Secondary | ICD-10-CM | POA: Diagnosis not present

## 2018-12-10 ENCOUNTER — Other Ambulatory Visit: Payer: Medicare HMO | Admitting: Hematology and Oncology

## 2018-12-10 ENCOUNTER — Other Ambulatory Visit: Payer: Medicare HMO

## 2018-12-10 ENCOUNTER — Ambulatory Visit: Payer: Medicare HMO | Admitting: Hematology and Oncology

## 2018-12-11 ENCOUNTER — Inpatient Hospital Stay: Payer: Medicare HMO | Attending: Internal Medicine | Admitting: Internal Medicine

## 2018-12-11 ENCOUNTER — Inpatient Hospital Stay: Payer: Medicare HMO

## 2018-12-11 ENCOUNTER — Other Ambulatory Visit: Payer: Medicare HMO

## 2018-12-11 ENCOUNTER — Other Ambulatory Visit: Payer: Self-pay

## 2018-12-11 DIAGNOSIS — D696 Thrombocytopenia, unspecified: Secondary | ICD-10-CM

## 2018-12-11 DIAGNOSIS — D500 Iron deficiency anemia secondary to blood loss (chronic): Secondary | ICD-10-CM

## 2018-12-11 DIAGNOSIS — D509 Iron deficiency anemia, unspecified: Secondary | ICD-10-CM | POA: Insufficient documentation

## 2018-12-11 LAB — CBC WITH DIFFERENTIAL/PLATELET
Abs Immature Granulocytes: 0.03 10*3/uL (ref 0.00–0.07)
Basophils Absolute: 0 10*3/uL (ref 0.0–0.1)
Basophils Relative: 1 %
Eosinophils Absolute: 0.2 10*3/uL (ref 0.0–0.5)
Eosinophils Relative: 4 %
HCT: 32.1 % — ABNORMAL LOW (ref 36.0–46.0)
Hemoglobin: 11.1 g/dL — ABNORMAL LOW (ref 12.0–15.0)
Immature Granulocytes: 1 %
Lymphocytes Relative: 19 %
Lymphs Abs: 1.1 10*3/uL (ref 0.7–4.0)
MCH: 31.9 pg (ref 26.0–34.0)
MCHC: 34.6 g/dL (ref 30.0–36.0)
MCV: 92.2 fL (ref 80.0–100.0)
Monocytes Absolute: 0.4 10*3/uL (ref 0.1–1.0)
Monocytes Relative: 7 %
Neutro Abs: 4 10*3/uL (ref 1.7–7.7)
Neutrophils Relative %: 68 %
Platelets: 117 10*3/uL — ABNORMAL LOW (ref 150–400)
RBC: 3.48 MIL/uL — ABNORMAL LOW (ref 3.87–5.11)
RDW: 12.7 % (ref 11.5–15.5)
WBC: 5.9 10*3/uL (ref 4.0–10.5)
nRBC: 0 % (ref 0.0–0.2)

## 2018-12-11 LAB — FERRITIN: Ferritin: 86 ng/mL (ref 11–307)

## 2018-12-11 LAB — IRON AND TIBC
Iron: 80 ug/dL (ref 28–170)
Saturation Ratios: 18 % (ref 10.4–31.8)
TIBC: 435 ug/dL (ref 250–450)
UIBC: 355 ug/dL

## 2018-12-11 NOTE — Progress Notes (Signed)
Patient is transferring care from Dr. Mike Gip.  Does tolerate oral iron.  Reports feeling more fatigued than her normal.  Has noticed her hair is getting thinner.

## 2018-12-11 NOTE — Progress Notes (Signed)
[facility] CONSULT NOTE  Patient Care Team: Tracie Harrier, MD as PCP - General (Internal Medicine) Tracie Harrier, MD as Physician Assistant (Internal Medicine) Lequita Asal, MD as Consulting Physician (Hematology and Oncology)  CHIEF COMPLAINTS/PURPOSE OF CONSULTATION: Iron deficiency anemia/thrombocytopenia  #Iron deficient anemia-Dr. Elliott/Dr. Mike Gip #Mild thrombocytopenia platelets 120s #Fatigue Oncology History   No history exists.      HISTORY OF PRESENTING ILLNESS:  Kim Ballard 84 y.o.  female longstanding history of iron deficient anemia/thrombocytopenia is here for follow-up.  She was previously followed by Dr. Mike Gip.  She complains of ongoing fatigue.  Denies any blood in stools or black or stools.  Previously she had GI evaluation for anemia.  She denies any easy bruising or bleeding gums or nosebleeds.  Review of Systems  Constitutional: Positive for malaise/fatigue. Negative for chills, diaphoresis, fever and weight loss.  HENT: Negative for nosebleeds and sore throat.   Eyes: Negative for double vision.  Respiratory: Negative for cough, hemoptysis, sputum production, shortness of breath and wheezing.   Cardiovascular: Negative for chest pain, palpitations, orthopnea and leg swelling.  Gastrointestinal: Negative for abdominal pain, blood in stool, constipation, diarrhea, heartburn, melena, nausea and vomiting.  Genitourinary: Negative for dysuria, frequency and urgency.  Musculoskeletal: Negative for back pain and joint pain.  Skin: Negative.  Negative for itching and rash.  Neurological: Negative for dizziness, tingling, focal weakness, weakness and headaches.  Endo/Heme/Allergies: Does not bruise/bleed easily.  Psychiatric/Behavioral: Negative for depression. The patient is not nervous/anxious and does not have insomnia.      MEDICAL HISTORY:  Past Medical History:  Diagnosis Date  . Allergic state   . Anemia   .  Arthritis   . Cancer ([place])    skin cancers  . Chronic cystitis with hematuria   . Depression   . Diabetes mellitus without complication ([place])   . Fibrocystic disease of both breasts   . Fibromyalgia   . GERD (gastroesophageal reflux disease)   . Hypertension   . IBS (irritable bowel syndrome)   . Nausea   . Skin cancer   . Wears dentures    full upper and lower    SURGICAL HISTORY: Past Surgical History:  Procedure Laterality Date  . ABDOMINAL HYSTERECTOMY    . APPENDECTOMY    . BREAST BIOPSY Right 2001   surgical bx   . CATARACT EXTRACTION W/PHACO Right 03/08/2017   Procedure: CATARACT EXTRACTION PHACO AND INTRAOCULAR LENS PLACEMENT ([place]) RIGHT DIABETIC TORIC;  Surgeon: Leandrew Koyanagi, MD;  Location: [place];  Service: Ophthalmology;  Laterality: Right;  Diabetic - oral meds  . CATARACT EXTRACTION W/PHACO Left 04/17/2017   Procedure: CATARACT EXTRACTION PHACO AND INTRAOCULAR LENS PLACEMENT ([place]) LEFT DIABETIC;  Surgeon: Leandrew Koyanagi, MD;  Location: [place];  Service: Ophthalmology;  Laterality: Left;  diabetic-oral meds  . CHOLECYSTECTOMY    . COLONOSCOPY    . COLONOSCOPY WITH PROPOFOL N/A 08/27/2015   Procedure: COLONOSCOPY WITH PROPOFOL;  Surgeon: Manya Silvas, MD;  Location: [facility] ENDOSCOPY;  Service: Endoscopy;  Laterality: N/A;  . COLONOSCOPY WITH PROPOFOL N/A 05/02/2016   Procedure: COLONOSCOPY WITH PROPOFOL;  Surgeon: Manya Silvas, MD;  Location: [facility] ENDOSCOPY;  Service: Endoscopy;  Laterality: N/A;  . ESOPHAGOGASTRODUODENOSCOPY    . ESOPHAGOGASTRODUODENOSCOPY (EGD) WITH PROPOFOL N/A 08/27/2015   Procedure: ESOPHAGOGASTRODUODENOSCOPY (EGD) WITH PROPOFOL;  Surgeon: Manya Silvas, MD;  Location: [facility] ENDOSCOPY;  Service: Endoscopy;  Laterality: N/A;  . HERNIA REPAIR      SOCIAL HISTORY:  Social History   Socioeconomic History  . Marital status: Married    Spouse name: Not on file  . Number of children: Not on file  .  Years of education: Not on file  . Highest education level: Not on file  Occupational History  . Not on file  Social Needs  . Financial resource strain: Not on file  . Food insecurity    Worry: Not on file    Inability: Not on file  . Transportation needs    Medical: Not on file    Non-medical: Not on file  Tobacco Use  . Smoking status: Former Smoker    Quit date: 1960    Years since quitting: 60.6  . Smokeless tobacco: Never Used  Substance and Sexual Activity  . Alcohol use: No  . Drug use: No  . Sexual activity: Not Currently  Lifestyle  . Physical activity    Days per week: Not on file    Minutes per session: Not on file  . Stress: Not on file  Relationships  . Social Herbalist on phone: Not on file    Gets together: Not on file    Attends religious service: Not on file    Active member of club or organization: Not on file    Attends meetings of clubs or organizations: Not on file    Relationship status: Not on file  . Intimate partner violence    Fear of current or ex partner: Not on file    Emotionally abused: Not on file    Physically abused: Not on file    Forced sexual activity: Not on file  Other Topics Concern  . Not on file  Social History Narrative  . Not on file    FAMILY HISTORY: Family History  Problem Relation Age of Onset  . Hypertension Mother   . Ovarian cancer Paternal Grandmother   . COPD Father   . Hyperlipidemia Father   . Kidney disease Father   . Diabetes Maternal Grandmother     ALLERGIES:  is allergic to biaxin [clarithromycin]; brompheniramine maleate; ceclor [cefaclor]; ciprofloxacin; clinoril [sulindac]; codeine sulfate; diovan [valsartan]; doxycycline; erythromycin; esomeprazole; fluoxetine; guaifenesin & derivatives; levaquin [levofloxacin in d5w]; lisinopril; lodine [etodolac]; macrodantin [nitrofurantoin macrocrystal]; medrol [methylprednisolone]; mobic [meloxicam]; motrin [ibuprofen]; nexium [esomeprazole  magnesium]; nitrofurantoin; norvasc [amlodipine besylate]; omnicef [cefdinir]; penicillins; prozac [fluoxetine hcl]; pseudoephedrine; reglan [metoclopramide]; seldane [terfenadine]; sulfa antibiotics; toprol xl [metoprolol tartrate]; triamterene; tussionex pennkinetic er [hydrocod polst-cpm polst er]; and vibramycin [doxycycline calcium].  MEDICATIONS:  Current Outpatient Medications  Medication Sig Dispense Refill  . Ascorbic Acid (VITAMIN C) 1000 MG tablet Take 1,000 mg by mouth 2 times daily at 12 noon and 4 pm.    . cholecalciferol (VITAMIN D3) 25 MCG (1000 UT) tablet Take 1,000 Units by mouth 2 (two) times daily.    . clidinium-chlordiazePOXIDE (LIBRAX) 5-2.5 MG capsule Take 1 capsule by mouth.    . Cranberry 405 MG CAPS Take 2 capsules by mouth 2 (two) times daily.     . ferrous sulfate 325 (65 FE) MG tablet Take 325 mg by mouth 2 (two) times daily with a meal.    . fluticasone (FLONASE) 50 MCG/ACT nasal spray Place into both nostrils daily.    Marland Kitchen gemfibrozil (LOPID) 600 MG tablet Take 600 mg by mouth 2 (two) times daily before a meal.    . glipiZIDE (GLUCOTROL XL) 2.5 MG 24 hr tablet Take 2.5 mg by mouth daily with breakfast.    .  hydrochlorothiazide (HYDRODIURIL) 25 MG tablet Take 25 mg by mouth daily.    . Lactobacillus Rhamnosus, GG, ([employer] PROBIOTIC, LACTOBACILLUS, PO) Take by mouth.    . metFORMIN (GLUCOPHAGE) 500 MG tablet Take 500 mg by mouth 2 (two) times daily with a meal.    . Multiple Vitamins-Minerals (MULTIVITAMIN WITH MINERALS) tablet Take 1 tablet by mouth daily.    . OMEGA-3 FATTY ACIDS PO Take by mouth.    . pantoprazole (PROTONIX) 40 MG tablet Take 40 mg by mouth daily.    . vitamin B-12 (CYANOCOBALAMIN) 1000 MCG tablet Take 1,000 mcg by mouth daily.    . vitamin E 100 UNIT capsule Take by mouth daily.     No current facility-administered medications for this visit.    Facility-Administered Medications Ordered in Other Visits  Medication Dose Route Frequency Provider  Last Rate Last Dose  . iron sucrose (VENOFER) injection 200 mg  200 mg Intravenous Once Nolon Stalls C, MD          .  PHYSICAL EXAMINATION: ECOG PERFORMANCE STATUS: 0 - Asymptomatic  Vitals:   12/11/18 1112  BP: (!) 154/82  Pulse: 76  Resp: 18  Temp: (!) 95.5 F (35.3 C)   Filed Weights   12/11/18 1112  Weight: 150 lb 12.8 oz (68.4 kg)    Physical Exam  Constitutional: She is oriented to person, place, and time and well-developed, well-nourished, and in no distress.  HENT:  Head: Normocephalic and atraumatic.  Mouth/Throat: Oropharynx is clear and moist. No oropharyngeal exudate.  Eyes: Pupils are equal, round, and reactive to light.  Neck: Normal range of motion. Neck supple.  Cardiovascular: Normal rate and regular rhythm.  Pulmonary/Chest: Effort normal and breath sounds normal. No respiratory distress. She has no wheezes.  Abdominal: Soft. Bowel sounds are normal. She exhibits no distension and no mass. There is no abdominal tenderness. There is no rebound and no guarding.  Musculoskeletal: Normal range of motion.        General: No tenderness or edema.  Neurological: She is alert and oriented to person, place, and time.  Skin: Skin is warm.  Psychiatric: Affect normal.     LABORATORY DATA:  I have reviewed the data as listed Lab Results  Component Value Date   WBC 5.9 12/11/2018   HGB 11.1 (L) 12/11/2018   HCT 32.1 (L) 12/11/2018   MCV 92.2 12/11/2018   PLT 117 (L) 12/11/2018   Recent Labs    07/16/18 0942 09/11/18 1447  NA  --  141  K  --  3.9  CL  --  107  CO2  --  26  GLUCOSE  --  204*  BUN  --  23  CREATININE 0.80 1.22*  CALCIUM  --  9.4  GFRNONAA  --  42*  GFRAA  --  48*    RADIOGRAPHIC STUDIES: I have personally reviewed the radiological images as listed and agreed with the findings in the report. No results found.  ASSESSMENT & PLAN:   Thrombocytopenia ([place])  # Iron deficiency anemia Hemoglobin  11.1; saturations 18.   Ferritin 86. Proceed with Venofer IV infusions x3.  Will reevaluate in 2 months.  # Thrombocytopenia-platelet count-117.  Monitor for now if however decline recommend bone marrow biopsy.  #Fatigue- TSH April 2020- normal; DM- ? OSA- sleep study with Dr.Hande.   # DISPOSITION: # Venofer IV- weekly x3; fist asap.  # follow up in 2 months/cbc-possible Venofer-Dr.B    All questions were answered. The patient knows  to call the clinic with any problems, questions or concerns.       Cammie Sickle, MD 01/09/2019 10:26 AM

## 2018-12-11 NOTE — Assessment & Plan Note (Addendum)
#  Iron deficiency anemia Hemoglobin  11.1; saturations 18.  Ferritin 86. Proceed with Venofer IV infusions x3.  Will reevaluate in 2 months.  # Thrombocytopenia-platelet count-117.  Monitor for now if however decline recommend bone marrow biopsy.  #Fatigue- TSH April 2020- normal; DM- ? OSA- sleep study with Dr.Hande.   # DISPOSITION: # Venofer IV- weekly x3; fist asap.  # follow up in 2 months/cbc-possible Venofer-Dr.B

## 2018-12-17 ENCOUNTER — Other Ambulatory Visit: Payer: Self-pay

## 2018-12-18 ENCOUNTER — Inpatient Hospital Stay: Payer: Medicare HMO

## 2018-12-18 ENCOUNTER — Other Ambulatory Visit: Payer: Self-pay

## 2018-12-18 VITALS — BP 158/92 | HR 78 | Temp 96.9°F | Resp 18

## 2018-12-18 DIAGNOSIS — D508 Other iron deficiency anemias: Secondary | ICD-10-CM

## 2018-12-18 DIAGNOSIS — D509 Iron deficiency anemia, unspecified: Secondary | ICD-10-CM | POA: Diagnosis not present

## 2018-12-18 MED ORDER — IRON SUCROSE 20 MG/ML IV SOLN
200.0000 mg | Freq: Once | INTRAVENOUS | Status: AC
  Administered 2018-12-18: 200 mg via INTRAVENOUS
  Filled 2018-12-18: qty 10

## 2018-12-25 ENCOUNTER — Other Ambulatory Visit: Payer: Self-pay

## 2018-12-25 ENCOUNTER — Inpatient Hospital Stay: Payer: Medicare HMO

## 2018-12-25 VITALS — BP 163/69 | HR 76 | Temp 97.5°F | Resp 18

## 2018-12-25 DIAGNOSIS — D508 Other iron deficiency anemias: Secondary | ICD-10-CM

## 2018-12-25 DIAGNOSIS — D509 Iron deficiency anemia, unspecified: Secondary | ICD-10-CM | POA: Diagnosis not present

## 2018-12-25 DIAGNOSIS — D500 Iron deficiency anemia secondary to blood loss (chronic): Secondary | ICD-10-CM

## 2018-12-25 MED ORDER — IRON SUCROSE 20 MG/ML IV SOLN
200.0000 mg | Freq: Once | INTRAVENOUS | Status: AC
  Administered 2018-12-25: 200 mg via INTRAVENOUS
  Filled 2018-12-25: qty 10

## 2018-12-25 MED ORDER — SODIUM CHLORIDE 0.9 % IV SOLN
Freq: Once | INTRAVENOUS | Status: AC
  Administered 2018-12-25: 14:00:00 via INTRAVENOUS
  Filled 2018-12-25: qty 250

## 2018-12-25 NOTE — Progress Notes (Signed)
Pt tolerated infusion well. Pt and VS stable at discharge.  

## 2019-01-01 ENCOUNTER — Inpatient Hospital Stay: Payer: Medicare HMO

## 2019-01-01 ENCOUNTER — Other Ambulatory Visit: Payer: Self-pay

## 2019-01-01 VITALS — BP 154/73 | HR 70 | Temp 97.0°F | Resp 19

## 2019-01-01 DIAGNOSIS — D508 Other iron deficiency anemias: Secondary | ICD-10-CM

## 2019-01-01 DIAGNOSIS — D509 Iron deficiency anemia, unspecified: Secondary | ICD-10-CM | POA: Diagnosis not present

## 2019-01-01 DIAGNOSIS — D500 Iron deficiency anemia secondary to blood loss (chronic): Secondary | ICD-10-CM

## 2019-01-01 MED ORDER — SODIUM CHLORIDE 0.9 % IV SOLN
INTRAVENOUS | Status: DC
  Administered 2019-01-01: 14:00:00 via INTRAVENOUS
  Filled 2019-01-01: qty 250

## 2019-01-01 MED ORDER — IRON SUCROSE 20 MG/ML IV SOLN
200.0000 mg | Freq: Once | INTRAVENOUS | Status: AC
  Administered 2019-01-01: 200 mg via INTRAVENOUS
  Filled 2019-01-01: qty 10

## 2019-01-08 DIAGNOSIS — G8929 Other chronic pain: Secondary | ICD-10-CM | POA: Diagnosis not present

## 2019-01-08 DIAGNOSIS — R109 Unspecified abdominal pain: Secondary | ICD-10-CM | POA: Diagnosis not present

## 2019-01-08 DIAGNOSIS — K5909 Other constipation: Secondary | ICD-10-CM | POA: Diagnosis not present

## 2019-01-08 DIAGNOSIS — R1310 Dysphagia, unspecified: Secondary | ICD-10-CM | POA: Diagnosis not present

## 2019-01-21 DIAGNOSIS — I10 Essential (primary) hypertension: Secondary | ICD-10-CM | POA: Diagnosis not present

## 2019-01-21 DIAGNOSIS — K5900 Constipation, unspecified: Secondary | ICD-10-CM | POA: Diagnosis not present

## 2019-01-21 DIAGNOSIS — R140 Abdominal distension (gaseous): Secondary | ICD-10-CM | POA: Diagnosis not present

## 2019-01-21 DIAGNOSIS — E1165 Type 2 diabetes mellitus with hyperglycemia: Secondary | ICD-10-CM | POA: Diagnosis not present

## 2019-01-21 DIAGNOSIS — R1310 Dysphagia, unspecified: Secondary | ICD-10-CM | POA: Diagnosis not present

## 2019-01-21 DIAGNOSIS — R1319 Other dysphagia: Secondary | ICD-10-CM | POA: Insufficient documentation

## 2019-01-22 DIAGNOSIS — R8290 Unspecified abnormal findings in urine: Secondary | ICD-10-CM | POA: Diagnosis not present

## 2019-01-22 DIAGNOSIS — R1084 Generalized abdominal pain: Secondary | ICD-10-CM | POA: Diagnosis not present

## 2019-01-22 DIAGNOSIS — R194 Change in bowel habit: Secondary | ICD-10-CM | POA: Diagnosis not present

## 2019-01-22 DIAGNOSIS — D696 Thrombocytopenia, unspecified: Secondary | ICD-10-CM | POA: Diagnosis not present

## 2019-01-22 DIAGNOSIS — D509 Iron deficiency anemia, unspecified: Secondary | ICD-10-CM | POA: Diagnosis not present

## 2019-01-22 DIAGNOSIS — Z86010 Personal history of colonic polyps: Secondary | ICD-10-CM | POA: Insufficient documentation

## 2019-01-22 DIAGNOSIS — R1011 Right upper quadrant pain: Secondary | ICD-10-CM | POA: Diagnosis not present

## 2019-01-22 DIAGNOSIS — R1310 Dysphagia, unspecified: Secondary | ICD-10-CM | POA: Diagnosis not present

## 2019-01-22 DIAGNOSIS — R140 Abdominal distension (gaseous): Secondary | ICD-10-CM | POA: Diagnosis not present

## 2019-01-22 DIAGNOSIS — Z860101 Personal history of adenomatous and serrated colon polyps: Secondary | ICD-10-CM | POA: Insufficient documentation

## 2019-01-22 DIAGNOSIS — I10 Essential (primary) hypertension: Secondary | ICD-10-CM | POA: Diagnosis not present

## 2019-01-22 DIAGNOSIS — K5900 Constipation, unspecified: Secondary | ICD-10-CM | POA: Diagnosis not present

## 2019-01-22 DIAGNOSIS — E1165 Type 2 diabetes mellitus with hyperglycemia: Secondary | ICD-10-CM | POA: Diagnosis not present

## 2019-01-22 DIAGNOSIS — K219 Gastro-esophageal reflux disease without esophagitis: Secondary | ICD-10-CM | POA: Diagnosis not present

## 2019-01-29 DIAGNOSIS — R1084 Generalized abdominal pain: Secondary | ICD-10-CM | POA: Diagnosis not present

## 2019-01-29 DIAGNOSIS — R1310 Dysphagia, unspecified: Secondary | ICD-10-CM | POA: Diagnosis not present

## 2019-01-29 DIAGNOSIS — D509 Iron deficiency anemia, unspecified: Secondary | ICD-10-CM | POA: Diagnosis not present

## 2019-01-29 DIAGNOSIS — Z86010 Personal history of colonic polyps: Secondary | ICD-10-CM | POA: Diagnosis not present

## 2019-01-29 DIAGNOSIS — D696 Thrombocytopenia, unspecified: Secondary | ICD-10-CM | POA: Diagnosis not present

## 2019-01-29 DIAGNOSIS — R1011 Right upper quadrant pain: Secondary | ICD-10-CM | POA: Diagnosis not present

## 2019-01-29 DIAGNOSIS — R194 Change in bowel habit: Secondary | ICD-10-CM | POA: Diagnosis not present

## 2019-01-29 DIAGNOSIS — K219 Gastro-esophageal reflux disease without esophagitis: Secondary | ICD-10-CM | POA: Diagnosis not present

## 2019-01-30 ENCOUNTER — Other Ambulatory Visit: Payer: Self-pay | Admitting: Student

## 2019-01-30 DIAGNOSIS — R1310 Dysphagia, unspecified: Secondary | ICD-10-CM

## 2019-01-30 DIAGNOSIS — R1319 Other dysphagia: Secondary | ICD-10-CM

## 2019-02-01 ENCOUNTER — Ambulatory Visit
Admission: RE | Admit: 2019-02-01 | Discharge: 2019-02-01 | Disposition: A | Discharge status: Home / Self Care | Payer: Medicare HMO | Source: Ambulatory Visit | Attending: Student | Admitting: Student

## 2019-02-01 ENCOUNTER — Other Ambulatory Visit: Payer: Self-pay

## 2019-02-01 DIAGNOSIS — R1310 Dysphagia, unspecified: Secondary | ICD-10-CM | POA: Diagnosis not present

## 2019-02-01 DIAGNOSIS — R1319 Other dysphagia: Secondary | ICD-10-CM

## 2019-02-01 DIAGNOSIS — K219 Gastro-esophageal reflux disease without esophagitis: Secondary | ICD-10-CM | POA: Diagnosis not present

## 2019-02-12 ENCOUNTER — Ambulatory Visit: Payer: Medicare HMO

## 2019-02-12 ENCOUNTER — Ambulatory Visit: Payer: Medicare HMO | Admitting: Internal Medicine

## 2019-02-12 ENCOUNTER — Other Ambulatory Visit: Payer: Medicare HMO

## 2019-02-15 ENCOUNTER — Encounter: Payer: Self-pay | Admitting: Internal Medicine

## 2019-02-15 ENCOUNTER — Other Ambulatory Visit: Payer: Self-pay

## 2019-02-15 NOTE — Progress Notes (Signed)
Patient prescreened no concerns today. 

## 2019-02-18 ENCOUNTER — Other Ambulatory Visit: Payer: Self-pay

## 2019-02-18 ENCOUNTER — Ambulatory Visit: Payer: Medicare HMO

## 2019-02-18 ENCOUNTER — Inpatient Hospital Stay: Payer: Medicare HMO

## 2019-02-18 ENCOUNTER — Inpatient Hospital Stay: Payer: Medicare HMO | Attending: Internal Medicine

## 2019-02-18 ENCOUNTER — Inpatient Hospital Stay (HOSPITAL_BASED_OUTPATIENT_CLINIC_OR_DEPARTMENT_OTHER): Payer: Medicare HMO | Admitting: Internal Medicine

## 2019-02-18 DIAGNOSIS — D500 Iron deficiency anemia secondary to blood loss (chronic): Secondary | ICD-10-CM

## 2019-02-18 DIAGNOSIS — Z87891 Personal history of nicotine dependence: Secondary | ICD-10-CM | POA: Insufficient documentation

## 2019-02-18 DIAGNOSIS — R5383 Other fatigue: Secondary | ICD-10-CM | POA: Insufficient documentation

## 2019-02-18 DIAGNOSIS — E1142 Type 2 diabetes mellitus with diabetic polyneuropathy: Secondary | ICD-10-CM | POA: Diagnosis not present

## 2019-02-18 DIAGNOSIS — I10 Essential (primary) hypertension: Secondary | ICD-10-CM | POA: Diagnosis not present

## 2019-02-18 DIAGNOSIS — D696 Thrombocytopenia, unspecified: Secondary | ICD-10-CM | POA: Diagnosis not present

## 2019-02-18 DIAGNOSIS — D509 Iron deficiency anemia, unspecified: Secondary | ICD-10-CM | POA: Insufficient documentation

## 2019-02-18 LAB — CBC WITH DIFFERENTIAL/PLATELET
Abs Immature Granulocytes: 0.03 10*3/uL (ref 0.00–0.07)
Basophils Absolute: 0 10*3/uL (ref 0.0–0.1)
Basophils Relative: 1 %
Eosinophils Absolute: 0.2 10*3/uL (ref 0.0–0.5)
Eosinophils Relative: 5 %
HCT: 32.8 % — ABNORMAL LOW (ref 36.0–46.0)
Hemoglobin: 11.1 g/dL — ABNORMAL LOW (ref 12.0–15.0)
Immature Granulocytes: 1 %
Lymphocytes Relative: 23 %
Lymphs Abs: 1.1 10*3/uL (ref 0.7–4.0)
MCH: 31.6 pg (ref 26.0–34.0)
MCHC: 33.8 g/dL (ref 30.0–36.0)
MCV: 93.4 fL (ref 80.0–100.0)
Monocytes Absolute: 0.4 10*3/uL (ref 0.1–1.0)
Monocytes Relative: 9 %
Neutro Abs: 2.9 10*3/uL (ref 1.7–7.7)
Neutrophils Relative %: 61 %
Platelets: 131 10*3/uL — ABNORMAL LOW (ref 150–400)
RBC: 3.51 MIL/uL — ABNORMAL LOW (ref 3.87–5.11)
RDW: 12.6 % (ref 11.5–15.5)
WBC: 4.7 10*3/uL (ref 4.0–10.5)
nRBC: 0 % (ref 0.0–0.2)

## 2019-02-18 LAB — FERRITIN: Ferritin: 212 ng/mL (ref 11–307)

## 2019-02-18 NOTE — Assessment & Plan Note (Addendum)
#  Chronic mild anemia-hemoglobin 11.1 question iron deficiency versus others.  Recommend proceeding with Venofer/symptomatic improvement.  Await ferritin today.  Discussed regarding a bone marrow biopsy for definitive diagnosis.  Patient reluctant.  Reasonable to hold off bone marrow at this time  # Thrombocytopenia-platelet count-130; ITP versus others.  Again discussed regarding a bone marrow wants to hold off for now.  # HTN- poorly controlled; cuff at home- at 120; today 190 repeat 073 systolic.  Recommend checking blood pressures closely at home.  # PN- 2- ? Sec to DM.  Defer to PCP  # DISPOSITION: # HOLD venofer # Venofer IV this week; pls talk to pt # follow up in 4 months/cbc/bmp-possible Venofer-Dr.B  # 25 minutes face-to-face with the patient discussing the above plan of care; more than 50% of time spent on prognosis/ natural history; counseling and coordination.

## 2019-02-18 NOTE — Assessment & Plan Note (Deleted)
#  Iron deficiency anemia Hemoglobin  11.1; saturations 18.  Ferritin 86 s/p IV Venofer x3  # Thrombocytopenia-platelet count-1130; discussed re: Bone marrow biopsy;reluctnat/ will hold off  Monitor for now if however decline recommend bone marrow biopsy.  #Fatigue- TSH April 2020- normal; DM- ? OSA- sleep study with Dr.Hande.   # HTN- poorly controlled; cuff at home- at 120; sometimes  # PN- 2- ? Sec to DM.   # DISPOSITION: # HOLD venofer # Venofer IV this week; pls talk to pt # follow up in 4 months/cbc/bmp-possible Venofer-Dr.B

## 2019-02-18 NOTE — Progress Notes (Signed)
[place] CONSULT NOTE  Patient Care Team: Tracie Harrier, MD as PCP - General (Internal Medicine) Tracie Harrier, MD as Physician Assistant (Internal Medicine) Lequita Asal, MD as Consulting Physician (Hematology and Oncology)  CHIEF COMPLAINTS/PURPOSE OF CONSULTATION: Iron deficiency anemia/thrombocytopenia  #Chronic [2015] mild anemia hemoglobin 11.1-question iron deficiency versus others.-Dr. Elliott/Dr. Mike Gip; s/p Venofer.   #Mild thrombocytopenia platelets 120s  #Fatigue/ PN/DM-   Oncology History   No history exists.    HISTORY OF PRESENTING ILLNESS:  Kim Ballard 84 y.o.  female longstanding history of iron deficient anemia/thrombocytopenia is here for follow-up.   Patient continues to complain of ongoing fatigue.  Denies any nosebleeds or gum bleeding.  No blood in stools or black or stools.  No nausea no vomiting.   Review of Systems  Constitutional: Positive for malaise/fatigue. Negative for chills, diaphoresis, fever and weight loss.  HENT: Negative for nosebleeds and sore throat.   Eyes: Negative for double vision.  Respiratory: Negative for cough, hemoptysis, sputum production, shortness of breath and wheezing.   Cardiovascular: Negative for chest pain, palpitations, orthopnea and leg swelling.  Gastrointestinal: Negative for abdominal pain, blood in stool, constipation, diarrhea, heartburn, melena, nausea and vomiting.  Genitourinary: Negative for dysuria, frequency and urgency.  Musculoskeletal: Negative for back pain and joint pain.  Skin: Negative.  Negative for itching and rash.  Neurological: Positive for tingling. Negative for dizziness, focal weakness, weakness and headaches.  Endo/Heme/Allergies: Does not bruise/bleed easily.  Psychiatric/Behavioral: Negative for depression. The patient is not nervous/anxious and does not have insomnia.      MEDICAL HISTORY:  Past Medical History:  Diagnosis Date  . Allergic state    . Anemia   . Arthritis   . Cancer ([place])    skin cancers  . Chronic cystitis with hematuria   . Depression   . Diabetes mellitus without complication ([place])   . Fibrocystic disease of both breasts   . Fibromyalgia   . GERD (gastroesophageal reflux disease)   . Hypertension   . IBS (irritable bowel syndrome)   . Nausea   . Skin cancer   . Wears dentures    full upper and lower    SURGICAL HISTORY: Past Surgical History:  Procedure Laterality Date  . ABDOMINAL HYSTERECTOMY    . APPENDECTOMY    . BREAST BIOPSY Right 2001   surgical bx   . CATARACT EXTRACTION W/PHACO Right 03/08/2017   Procedure: CATARACT EXTRACTION PHACO AND INTRAOCULAR LENS PLACEMENT ([place]) RIGHT DIABETIC TORIC;  Surgeon: Leandrew Koyanagi, MD;  Location: [facility];  Service: Ophthalmology;  Laterality: Right;  Diabetic - oral meds  . CATARACT EXTRACTION W/PHACO Left 04/17/2017   Procedure: CATARACT EXTRACTION PHACO AND INTRAOCULAR LENS PLACEMENT ([place]) LEFT DIABETIC;  Surgeon: Leandrew Koyanagi, MD;  Location: [facility];  Service: Ophthalmology;  Laterality: Left;  diabetic-oral meds  . CHOLECYSTECTOMY    . COLONOSCOPY    . COLONOSCOPY WITH PROPOFOL N/A 08/27/2015   Procedure: COLONOSCOPY WITH PROPOFOL;  Surgeon: Manya Silvas, MD;  Location: [facility] ENDOSCOPY;  Service: Endoscopy;  Laterality: N/A;  . COLONOSCOPY WITH PROPOFOL N/A 05/02/2016   Procedure: COLONOSCOPY WITH PROPOFOL;  Surgeon: Manya Silvas, MD;  Location[facility] END[facility]COPY;  Service: Endoscopy;  Laterality: N/A;  . ESOPHAGOGASTRODUODENOSCOPY    . ESOPHAGOGASTRODUODENOSCOPY (EGD) WITH PROPOFOL N/A 08/27/2015   Procedure: ESOPHAGOGASTRODUODENOSCOPY (EGD) WITH PROPOFOL;  Surgeon: Manya Silvas, MD;  Location: [facility] ENDOSCOPY;  Service: Endoscopy;  Laterality: N/A;  . HERNIA REPAIR  SOCIAL HISTORY: Social History   Socioeconomic History  . Marital status: Married    Spouse name: Not on file  . Number of children:  Not on file  . Years of education: Not on file  . Highest education level: Not on file  Occupational History  . Not on file  Social Needs  . Financial resource strain: Not on file  . Food insecurity    Worry: Not on file    Inability: Not on file  . Transportation needs    Medical: Not on file    Non-medical: Not on file  Tobacco Use  . Smoking status: Former Smoker    Quit date: 1960    Years since quitting: 60.7  . Smokeless tobacco: Never Used  Substance and Sexual Activity  . Alcohol use: No  . Drug use: No  . Sexual activity: Not Currently  Lifestyle  . Physical activity    Days per week: Not on file    Minutes per session: Not on file  . Stress: Not on file  Relationships  . Social Herbalist on phone: Not on file    Gets together: Not on file    Attends religious service: Not on file    Active member of club or organization: Not on file    Attends meetings of clubs or organizations: Not on file    Relationship status: Not on file  . Intimate partner violence    Fear of current or ex partner: Not on file    Emotionally abused: Not on file    Physically abused: Not on file    Forced sexual activity: Not on file  Other Topics Concern  . Not on file  Social History Narrative  . Not on file    FAMILY HISTORY: Family History  Problem Relation Age of Onset  . Hypertension Mother   . Ovarian cancer Paternal Grandmother   . COPD Father   . Hyperlipidemia Father   . Kidney disease Father   . Diabetes Maternal Grandmother     ALLERGIES:  is allergic to biaxin [clarithromycin]; brompheniramine maleate; ceclor [cefaclor]; ciprofloxacin; clinoril [sulindac]; codeine sulfate; diovan [valsartan]; doxycycline; erythromycin; esomeprazole; fluoxetine; guaifenesin & derivatives; levaquin [levofloxacin in d5w]; lisinopril; lodine [etodolac]; macrodantin [nitrofurantoin macrocrystal]; medrol [methylprednisolone]; mobic [meloxicam]; motrin [ibuprofen]; nexium  [esomeprazole magnesium]; nitrofurantoin; norvasc [amlodipine besylate]; omnicef [cefdinir]; penicillins; prozac [fluoxetine hcl]; pseudoephedrine; reglan [metoclopramide]; seldane [terfenadine]; sulfa antibiotics; toprol xl [metoprolol tartrate]; triamterene; tussionex pennkinetic er [hydrocod polst-cpm polst er]; and vibramycin [doxycycline calcium].  MEDICATIONS:  Current Outpatient Medications  Medication Sig Dispense Refill  . Ascorbic Acid (VITAMIN C) 1000 MG tablet Take 1,000 mg by mouth 2 times daily at 12 noon and 4 pm.    . cholecalciferol (VITAMIN D3) 25 MCG (1000 UT) tablet Take 1,000 Units by mouth 2 (two) times daily.    . clidinium-chlordiazePOXIDE (LIBRAX) 5-2.5 MG capsule Take 1 capsule by mouth.    . Cranberry 405 MG CAPS Take 2 capsules by mouth 2 (two) times daily.     . ferrous sulfate 325 (65 FE) MG tablet Take 325 mg by mouth 2 (two) times daily with a meal.    . fluticasone (FLONASE) 50 MCG/ACT nasal spray Place into both nostrils daily.    Marland Kitchen gemfibrozil (LOPID) 600 MG tablet Take 600 mg by mouth 2 (two) times daily before a meal.    . glipiZIDE (GLUCOTROL XL) 2.5 MG 24 hr tablet Take 2.5 mg by mouth daily with breakfast.    .  hydrochlorothiazide (HYDRODIURIL) 25 MG tablet Take 25 mg by mouth daily.    . Lactobacillus Rhamnosus, GG, ([employer] PROBIOTIC, LACTOBACILLUS, PO) Take by mouth.    . metFORMIN (GLUCOPHAGE) 500 MG tablet Take 500 mg by mouth 2 (two) times daily with a meal.    . Multiple Vitamins-Minerals (MULTIVITAMIN WITH MINERALS) tablet Take 1 tablet by mouth daily.    . OMEGA-3 FATTY ACIDS PO Take by mouth.    . pantoprazole (PROTONIX) 40 MG tablet Take 40 mg by mouth daily.    . vitamin B-12 (CYANOCOBALAMIN) 1000 MCG tablet Take 1,000 mcg by mouth daily.    . vitamin E 100 UNIT capsule Take by mouth daily.     No current facility-administered medications for this visit.    Facility-Administered Medications Ordered in Other Visits  Medication Dose Route  Frequency Provider Last Rate Last Dose  . iron sucrose (VENOFER) injection 200 mg  200 mg Intravenous Once Lequita Asal, MD          .  PHYSICAL EXAMINATION: ECOG PERFORMANCE STATUS: 0 - Asymptomatic  Vitals:   02/18/19 1059 02/18/19 1115  BP: (!) 194/62 (!) 179/80  Pulse:  67  Resp:    Temp:     Filed Weights   02/18/19 1055  Weight: 150 lb (68 kg)    Physical Exam  Constitutional: She is oriented to person, place, and time and well-developed, well-nourished, and in no distress.  HENT:  Head: Normocephalic and atraumatic.  Mouth/Throat: Oropharynx is clear and moist. No oropharyngeal exudate.  Eyes: Pupils are equal, round, and reactive to light.  Neck: Normal range of motion. Neck supple.  Cardiovascular: Normal rate and regular rhythm.  Pulmonary/Chest: Effort normal and breath sounds normal. No respiratory distress. She has no wheezes.  Abdominal: Soft. Bowel sounds are normal. She exhibits no distension and no mass. There is no abdominal tenderness. There is no rebound and no guarding.  Musculoskeletal: Normal range of motion.        General: No tenderness or edema.  Neurological: She is alert and oriented to person, place, and time.  Skin: Skin is warm.  Psychiatric: Affect normal.     LABORATORY DATA:  I have reviewed the data as listed Lab Results  Component Value Date   WBC 4.7 02/18/2019   HGB 11.1 (L) 02/18/2019   HCT 32.8 (L) 02/18/2019   MCV 93.4 02/18/2019   PLT 131 (L) 02/18/2019   Recent Labs    07/16/18 0942 09/11/18 1447  NA  --  141  K  --  3.9  CL  --  107  CO2  --  26  GLUCOSE  --  204*  BUN  --  23  CREATININE 0.80 1.22*  CALCIUM  --  9.4  GFRNONAA  --  42*  GFRAA  --  48*    RADIOGRAPHIC STUDIES: I have personally reviewed the radiological images as listed and agreed with the findings in the report. Dg Esophagus W Single Cm (sol Or Thin Ba)  Result Date: 02/01/2019 CLINICAL DATA:  Esophageal dysphagia EXAM: ESOPHOGRAM  / BARIUM SWALLOW / BARIUM TABLET STUDY TECHNIQUE: Combined double contrast and single contrast examination performed using effervescent crystals, thick barium liquid, and thin barium liquid. The patient was observed with fluoroscopy swallowing a 13 mm barium sulphate tablet. FLUOROSCOPY TIME:  Fluoroscopy Time:  48 seconds Radiation Exposure Index (if provided by the fluoroscopic device): 6.1 mGy Number of Acquired Spot Images: 0 COMPARISON:  None. FINDINGS: Normal pharyngeal anatomy and  motility. Contrast flowed freely through the esophagus without evidence of a mass. Normal esophageal mucosa without evidence of irregularity or ulceration. Esophageal motility was normal. Mild gastroesophageal reflux. No hiatal hernia. Prior hiatal hernia repair with the device in satisfactory position. Mild relative narrowing with smooth margins of the distal esophagus at the site of repair restricting the passage of a 13 mm barium tablet. IMPRESSION: 1. Mild gastroesophageal reflux. 2. Prior hiatal hernia repair with the device in satisfactory position. Mild relative narrowing of the distal esophagus at the site of repair restricting the passage of a 13 mm barium tablet. This may reflect a fixed stricture versus secondary compression from the prosthesis from the hiatal hernia repair. Electronically Signed   By: Kathreen Devoid   On: 02/01/2019 10:48    ASSESSMENT & PLAN:   Thrombocytopenia ([place]) #Chronic mild anemia-hemoglobin 11.1 question iron deficiency versus others.  Recommend proceeding with Venofer/symptomatic improvement.  Await ferritin today.  Discussed regarding a bone marrow biopsy for definitive diagnosis.  Patient reluctant.  Reasonable to hold off bone marrow at this time  # Thrombocytopenia-platelet count-130; ITP versus others.  Again discussed regarding a bone marrow wants to hold off for now.  # HTN- poorly controlled; cuff at home- at 120; today 190 repeat 166 systolic.  Recommend checking blood  pressures closely at home.  # PN- 2- ? Sec to DM.  Defer to PCP  # DISPOSITION: # HOLD venofer # Venofer IV this week; pls talk to pt # follow up in 4 months/cbc/bmp-possible Venofer-Dr.B All questions were answered. The patient knows to call the clinic with any problems, questions or concerns.    Kim Sickle, MD 02/18/2019 12:08 PM

## 2019-02-21 ENCOUNTER — Ambulatory Visit: Payer: Medicare HMO

## 2019-02-22 ENCOUNTER — Inpatient Hospital Stay: Payer: Medicare HMO

## 2019-02-22 ENCOUNTER — Other Ambulatory Visit: Payer: Self-pay

## 2019-02-22 VITALS — BP 157/80 | HR 73 | Temp 98.0°F | Resp 18

## 2019-02-22 DIAGNOSIS — R5383 Other fatigue: Secondary | ICD-10-CM | POA: Diagnosis not present

## 2019-02-22 DIAGNOSIS — D508 Other iron deficiency anemias: Secondary | ICD-10-CM

## 2019-02-22 DIAGNOSIS — D509 Iron deficiency anemia, unspecified: Secondary | ICD-10-CM | POA: Diagnosis not present

## 2019-02-22 DIAGNOSIS — E1142 Type 2 diabetes mellitus with diabetic polyneuropathy: Secondary | ICD-10-CM | POA: Diagnosis not present

## 2019-02-22 DIAGNOSIS — D696 Thrombocytopenia, unspecified: Secondary | ICD-10-CM | POA: Diagnosis not present

## 2019-02-22 DIAGNOSIS — I10 Essential (primary) hypertension: Secondary | ICD-10-CM | POA: Diagnosis not present

## 2019-02-22 DIAGNOSIS — D500 Iron deficiency anemia secondary to blood loss (chronic): Secondary | ICD-10-CM

## 2019-02-22 DIAGNOSIS — Z87891 Personal history of nicotine dependence: Secondary | ICD-10-CM | POA: Diagnosis not present

## 2019-02-22 MED ORDER — SODIUM CHLORIDE 0.9 % IV SOLN
Freq: Once | INTRAVENOUS | Status: AC
  Administered 2019-02-22: 14:00:00 via INTRAVENOUS
  Filled 2019-02-22: qty 250

## 2019-02-22 MED ORDER — IRON SUCROSE 20 MG/ML IV SOLN
200.0000 mg | Freq: Once | INTRAVENOUS | Status: AC
  Administered 2019-02-22: 200 mg via INTRAVENOUS
  Filled 2019-02-22: qty 10

## 2019-03-01 DIAGNOSIS — R1031 Right lower quadrant pain: Secondary | ICD-10-CM | POA: Diagnosis not present

## 2019-03-01 DIAGNOSIS — R933 Abnormal findings on diagnostic imaging of other parts of digestive tract: Secondary | ICD-10-CM | POA: Diagnosis not present

## 2019-03-01 DIAGNOSIS — Z86010 Personal history of colonic polyps: Secondary | ICD-10-CM | POA: Diagnosis not present

## 2019-03-01 DIAGNOSIS — R1311 Dysphagia, oral phase: Secondary | ICD-10-CM | POA: Diagnosis not present

## 2019-03-04 DIAGNOSIS — I10 Essential (primary) hypertension: Secondary | ICD-10-CM | POA: Diagnosis not present

## 2019-03-04 DIAGNOSIS — K589 Irritable bowel syndrome without diarrhea: Secondary | ICD-10-CM | POA: Diagnosis not present

## 2019-03-04 DIAGNOSIS — E1165 Type 2 diabetes mellitus with hyperglycemia: Secondary | ICD-10-CM | POA: Diagnosis not present

## 2019-03-04 DIAGNOSIS — M19021 Primary osteoarthritis, right elbow: Secondary | ICD-10-CM | POA: Diagnosis not present

## 2019-03-04 DIAGNOSIS — F339 Major depressive disorder, recurrent, unspecified: Secondary | ICD-10-CM | POA: Diagnosis not present

## 2019-03-04 DIAGNOSIS — N343 Urethral syndrome, unspecified: Secondary | ICD-10-CM | POA: Diagnosis not present

## 2019-03-04 DIAGNOSIS — E119 Type 2 diabetes mellitus without complications: Secondary | ICD-10-CM | POA: Diagnosis not present

## 2019-03-04 DIAGNOSIS — F411 Generalized anxiety disorder: Secondary | ICD-10-CM | POA: Diagnosis not present

## 2019-03-04 DIAGNOSIS — M25521 Pain in right elbow: Secondary | ICD-10-CM | POA: Diagnosis not present

## 2019-03-04 DIAGNOSIS — R2231 Localized swelling, mass and lump, right upper limb: Secondary | ICD-10-CM | POA: Diagnosis not present

## 2019-03-04 DIAGNOSIS — K649 Unspecified hemorrhoids: Secondary | ICD-10-CM | POA: Diagnosis not present

## 2019-03-04 DIAGNOSIS — K635 Polyp of colon: Secondary | ICD-10-CM | POA: Diagnosis not present

## 2019-03-04 DIAGNOSIS — M25421 Effusion, right elbow: Secondary | ICD-10-CM | POA: Diagnosis not present

## 2019-03-04 DIAGNOSIS — I8390 Asymptomatic varicose veins of unspecified lower extremity: Secondary | ICD-10-CM | POA: Diagnosis not present

## 2019-03-11 DIAGNOSIS — R2231 Localized swelling, mass and lump, right upper limb: Secondary | ICD-10-CM | POA: Diagnosis not present

## 2019-03-20 DIAGNOSIS — Z6823 Body mass index (BMI) 23.0-23.9, adult: Secondary | ICD-10-CM | POA: Diagnosis not present

## 2019-03-20 DIAGNOSIS — M2550 Pain in unspecified joint: Secondary | ICD-10-CM | POA: Diagnosis not present

## 2019-03-22 DIAGNOSIS — Z87891 Personal history of nicotine dependence: Secondary | ICD-10-CM | POA: Diagnosis not present

## 2019-03-22 DIAGNOSIS — N3020 Other chronic cystitis without hematuria: Secondary | ICD-10-CM | POA: Diagnosis not present

## 2019-03-22 DIAGNOSIS — N3090 Cystitis, unspecified without hematuria: Secondary | ICD-10-CM | POA: Diagnosis not present

## 2019-03-22 DIAGNOSIS — F329 Major depressive disorder, single episode, unspecified: Secondary | ICD-10-CM | POA: Diagnosis not present

## 2019-03-22 DIAGNOSIS — R300 Dysuria: Secondary | ICD-10-CM | POA: Diagnosis not present

## 2019-03-22 DIAGNOSIS — E119 Type 2 diabetes mellitus without complications: Secondary | ICD-10-CM | POA: Diagnosis not present

## 2019-03-22 DIAGNOSIS — K219 Gastro-esophageal reflux disease without esophagitis: Secondary | ICD-10-CM | POA: Diagnosis not present

## 2019-03-22 DIAGNOSIS — I10 Essential (primary) hypertension: Secondary | ICD-10-CM | POA: Diagnosis not present

## 2019-04-02 DIAGNOSIS — R350 Frequency of micturition: Secondary | ICD-10-CM | POA: Diagnosis not present

## 2019-04-02 DIAGNOSIS — R351 Nocturia: Secondary | ICD-10-CM | POA: Diagnosis not present

## 2019-04-11 DIAGNOSIS — R350 Frequency of micturition: Secondary | ICD-10-CM | POA: Diagnosis not present

## 2019-04-18 DIAGNOSIS — R350 Frequency of micturition: Secondary | ICD-10-CM | POA: Diagnosis not present

## 2019-04-18 DIAGNOSIS — R351 Nocturia: Secondary | ICD-10-CM | POA: Diagnosis not present

## 2019-04-18 DIAGNOSIS — N390 Urinary tract infection, site not specified: Secondary | ICD-10-CM | POA: Diagnosis not present

## 2019-04-29 DIAGNOSIS — Z23 Encounter for immunization: Secondary | ICD-10-CM | POA: Diagnosis not present

## 2019-05-21 ENCOUNTER — Telehealth: Payer: Self-pay | Admitting: *Deleted

## 2019-05-21 DIAGNOSIS — D500 Iron deficiency anemia secondary to blood loss (chronic): Secondary | ICD-10-CM

## 2019-05-21 NOTE — Telephone Encounter (Signed)
PT called and said that she would like to have her blood work drawn, she said she is feeling weak.

## 2019-05-21 NOTE — Telephone Encounter (Signed)
Spoke with Dr. Rogue Bussing - Please sch. Pt this this week or early next for a lab apt. Contact pt with new apts.

## 2019-05-22 ENCOUNTER — Other Ambulatory Visit: Payer: Self-pay

## 2019-05-22 ENCOUNTER — Inpatient Hospital Stay: Payer: Medicare HMO | Attending: Hematology and Oncology

## 2019-05-22 DIAGNOSIS — D509 Iron deficiency anemia, unspecified: Secondary | ICD-10-CM | POA: Diagnosis not present

## 2019-05-22 DIAGNOSIS — D500 Iron deficiency anemia secondary to blood loss (chronic): Secondary | ICD-10-CM

## 2019-05-22 LAB — COMPREHENSIVE METABOLIC PANEL
ALT: 13 U/L (ref 0–44)
AST: 8 U/L — ABNORMAL LOW (ref 15–41)
Albumin: 3.9 g/dL (ref 3.5–5.0)
Alkaline Phosphatase: 71 U/L (ref 38–126)
Anion gap: 10 (ref 5–15)
BUN: 29 mg/dL — ABNORMAL HIGH (ref 8–23)
CO2: 26 mmol/L (ref 22–32)
Calcium: 9.5 mg/dL (ref 8.9–10.3)
Chloride: 106 mmol/L (ref 98–111)
Creatinine, Ser: 1.21 mg/dL — ABNORMAL HIGH (ref 0.44–1.00)
GFR calc Af Amer: 48 mL/min — ABNORMAL LOW (ref >60)
GFR calc non Af Amer: 42 mL/min — ABNORMAL LOW (ref >60)
Glucose, Bld: 184 mg/dL — ABNORMAL HIGH (ref 70–99)
Potassium: 4.3 mmol/L (ref 3.5–5.1)
Sodium: 142 mmol/L (ref 135–145)
Total Bilirubin: 0.4 mg/dL (ref 0.3–1.2)
Total Protein: 6.9 g/dL (ref 6.5–8.1)

## 2019-05-22 LAB — CBC WITH DIFFERENTIAL/PLATELET
Abs Immature Granulocytes: 0.02 10*3/uL (ref 0.00–0.07)
Basophils Absolute: 0 10*3/uL (ref 0.0–0.1)
Basophils Relative: 0 %
Eosinophils Absolute: 0.3 10*3/uL (ref 0.0–0.5)
Eosinophils Relative: 6 %
HCT: 31.8 % — ABNORMAL LOW (ref 36.0–46.0)
Hemoglobin: 10.7 g/dL — ABNORMAL LOW (ref 12.0–15.0)
Immature Granulocytes: 0 %
Lymphocytes Relative: 25 %
Lymphs Abs: 1.1 10*3/uL (ref 0.7–4.0)
MCH: 31.8 pg (ref 26.0–34.0)
MCHC: 33.6 g/dL (ref 30.0–36.0)
MCV: 94.6 fL (ref 80.0–100.0)
Monocytes Absolute: 0.4 10*3/uL (ref 0.1–1.0)
Monocytes Relative: 9 %
Neutro Abs: 2.7 10*3/uL (ref 1.7–7.7)
Neutrophils Relative %: 60 %
Platelets: 127 10*3/uL — ABNORMAL LOW (ref 150–400)
RBC: 3.36 MIL/uL — ABNORMAL LOW (ref 3.87–5.11)
RDW: 12.1 % (ref 11.5–15.5)
WBC: 4.5 10*3/uL (ref 4.0–10.5)
nRBC: 0 % (ref 0.0–0.2)

## 2019-05-22 LAB — IRON AND TIBC
Iron: 56 ug/dL (ref 28–170)
Saturation Ratios: 14 % (ref 10.4–31.8)
TIBC: 388 ug/dL (ref 250–450)
UIBC: 332 ug/dL

## 2019-05-22 LAB — LACTATE DEHYDROGENASE: LDH: 129 U/L (ref 98–192)

## 2019-05-22 LAB — FERRITIN: Ferritin: 233 ng/mL (ref 11–307)

## 2019-05-23 DIAGNOSIS — Z20828 Contact with and (suspected) exposure to other viral communicable diseases: Secondary | ICD-10-CM | POA: Diagnosis not present

## 2019-05-24 ENCOUNTER — Telehealth: Payer: Self-pay | Admitting: *Deleted

## 2019-05-24 NOTE — Telephone Encounter (Signed)
Patient called asking for results  Ferritin Order: [id number]  Status: Final result  Visible to patient: No (inaccessible in [place])  Next appt: 06/17/2019 at 10:00 AM in Oncology ([facility])  Dx: Iron deficiency anemia due to chronic...    Ref Range & Units 2 d ago  Ferritin 11 - 307 ng/mL 233   Comment: Performed at [facility], [address] [place], [place] [address]  Resulting Agency  [facility] CLIN LAB     Specimen Collected: 05/22/19 14:11   Last Resulted: 05/22/19 16:53   Lab Flowsheet  Order Details  View Encounter  Lab and Collection Details  Routing  Result History      Other Results from 05/22/2019 Lactate dehydrogenase  Status: Final result  Visible to patient: No (inaccessible in MyChart)  Next appt: 06/17/2019 at 10:00 AM in Oncology ([facility])  Dx: Iron deficiency anemia due to chronic...  Order: [id number]    Ref Range & Units 2 d ago  LDH 98 - 192 U/L 129   Comment: Performed at [facility], [address] [place], [place] [address]  Resulting Agency  [facility] CLIN LAB     Specimen Collected: 05/22/19 14:11   Last Resulted: 05/22/19 14:45   Lab Flowsheet  Order Details  View Encounter  Lab and Collection Details  Routing  Result History         Iron and TIBC  Status: Final result  Visible to patient: No (inaccessible in MyChart)  Next appt: 06/17/2019 at 10:00 AM in Oncology ([facility])  Dx: Iron deficiency anemia due to chronic...  Order: [id number]    Ref Range & Units 2 d ago  Iron 28 - 170 ug/dL 56   TIBC 250 - 450 ug/dL 388   Saturation Ratios 10.4 - 31.8 % 14   UIBC ug/dL 332   Comment: Performed at [facility], [address] [place], [place] [address]  Resulting Agency  [facility] CLIN LAB     Specimen Collected: 05/22/19 14:11   Last Resulted: 05/22/19 16:53   Lab Flowsheet  Order Details  View Encounter  Lab and Collection Details  Routing  Result History         Comprehensive metabolic  panel  Status: Final result  Visible to patient: No (inaccessible in MyChart)  Next appt: 06/17/2019 at 10:00 AM in Oncology ([facility])  Dx: Iron deficiency anemia due to chronic...  Order: [id number]    Ref Range & Units 2 d ago  Sodium 135 - 145 mmol/L 142   Potassium 3.5 - 5.1 mmol/L 4.3   Chloride 98 - 111 mmol/L 106   CO2 22 - 32 mmol/L 26   Glucose, Bld 70 - 99 mg/dL 184  BUN 8 - 23 mg/dL 29  Creatinine, Ser 0.44 - 1.00 mg/dL 1.21  Calcium 8.9 - 10.3 mg/dL 9.5   Total Protein 6.5 - 8.1 g/dL 6.9   Albumin 3.5 - 5.0 g/dL 3.9   AST 15 - 41 U/L 8  ALT 0 - 44 U/L 13   Alkaline Phosphatase 38 - 126 U/L 71   Total Bilirubin 0.3 - 1.2 mg/dL 0.4   GFR calc non Af Amer >60 mL/min 42  GFR calc Af Amer >60 mL/min 48  Anion gap 5 - 15 10   Comment: Performed at [facility], [address], [place], [place] [address]  Resulting Agency  [facility] CLIN LAB     Specimen Collected: 05/22/19 14:11   Last Resulted: 05/22/19 14:45  Lab Flowsheet  Order Details  View Encounter  Lab and Collection Details  Routing  Result History         CBC with Differential  Status: Final result  Visible to patient: No (inaccessible in MyChart)  Next appt: 06/17/2019 at 10:00 AM in Oncology ([facility])  Dx: Iron deficiency anemia due to chronic...  Order: [id number]    Ref Range & Units 2 d ago  WBC 4.0 - 10.5 K/uL 4.5   RBC 3.87 - 5.11 MIL/uL 3.36  Hemoglobin 12.0 - 15.0 g/dL 10.7  HCT 36.0 - 46.0 % 31.8  MCV 80.0 - 100.0 fL 94.6   MCH 26.0 - 34.0 pg 31.8   MCHC 30.0 - 36.0 g/dL 33.6   RDW 11.5 - 15.5 % 12.1   Platelets 150 - 400 K/uL 127  nRBC 0.0 - 0.2 % 0.0   Neutrophils Relative % % 60   Neutro Abs 1.7 - 7.7 K/uL 2.7   Lymphocytes Relative % 25   Lymphs Abs 0.7 - 4.0 K/uL 1.1   Monocytes Relative % 9   Monocytes Absolute 0.1 - 1.0 K/uL 0.4   Eosinophils Relative % 6   Eosinophils Absolute 0.0 - 0.5 K/uL 0.3   Basophils Relative % 0   Basophils Absolute 0.0 - 0.1 K/uL 0.0    Immature Granulocytes % 0   Abs Immature Granulocytes 0.00 - 0.07 K/uL 0.02   Comment: Performed at [facility], [address] [place], [place] [address]  Resulting Agency  [facility] CLIN LAB     Specimen Collected: 05/22/19 14:11   Last Resulted: 05/22/19 14:36

## 2019-05-27 NOTE — Telephone Encounter (Signed)
On 12/19- I spoke to patient regarding results of the blood work; mild iron deficiency; mild anemia.Patient quite symptomatic/interested in having iron infusion.  #C- Please schedule patient for IV iron/Venofer weekly x2; start this week [as per patient's questions]; keep other appts with me as planned. GB

## 2019-05-28 ENCOUNTER — Other Ambulatory Visit: Payer: Self-pay

## 2019-05-29 ENCOUNTER — Other Ambulatory Visit: Payer: Self-pay

## 2019-05-29 ENCOUNTER — Inpatient Hospital Stay: Payer: Medicare HMO

## 2019-05-29 VITALS — BP 148/82 | HR 76 | Temp 97.0°F | Resp 18

## 2019-05-29 DIAGNOSIS — D509 Iron deficiency anemia, unspecified: Secondary | ICD-10-CM | POA: Diagnosis not present

## 2019-05-29 DIAGNOSIS — D508 Other iron deficiency anemias: Secondary | ICD-10-CM

## 2019-05-29 MED ORDER — SODIUM CHLORIDE 0.9 % IV SOLN
Freq: Once | INTRAVENOUS | Status: AC
  Filled 2019-05-29: qty 250

## 2019-05-29 MED ORDER — IRON SUCROSE 20 MG/ML IV SOLN
200.0000 mg | Freq: Once | INTRAVENOUS | Status: AC
  Administered 2019-05-29: 200 mg via INTRAVENOUS
  Filled 2019-05-29: qty 10

## 2019-06-04 ENCOUNTER — Other Ambulatory Visit: Payer: Self-pay

## 2019-06-04 DIAGNOSIS — N390 Urinary tract infection, site not specified: Secondary | ICD-10-CM | POA: Diagnosis not present

## 2019-06-04 DIAGNOSIS — R350 Frequency of micturition: Secondary | ICD-10-CM | POA: Diagnosis not present

## 2019-06-05 ENCOUNTER — Inpatient Hospital Stay: Payer: Medicare HMO

## 2019-06-05 ENCOUNTER — Other Ambulatory Visit: Payer: Self-pay

## 2019-06-05 VITALS — BP 146/81 | HR 77 | Temp 96.6°F | Resp 18

## 2019-06-05 DIAGNOSIS — D509 Iron deficiency anemia, unspecified: Secondary | ICD-10-CM | POA: Diagnosis not present

## 2019-06-05 DIAGNOSIS — D508 Other iron deficiency anemias: Secondary | ICD-10-CM

## 2019-06-05 MED ORDER — SODIUM CHLORIDE 0.9 % IV SOLN
Freq: Once | INTRAVENOUS | Status: AC
  Filled 2019-06-05: qty 250

## 2019-06-05 MED ORDER — IRON SUCROSE 20 MG/ML IV SOLN
200.0000 mg | Freq: Once | INTRAVENOUS | Status: AC
  Administered 2019-06-05: 200 mg via INTRAVENOUS
  Filled 2019-06-05: qty 10

## 2019-06-13 ENCOUNTER — Other Ambulatory Visit: Payer: Self-pay | Admitting: *Deleted

## 2019-06-13 DIAGNOSIS — D500 Iron deficiency anemia secondary to blood loss (chronic): Secondary | ICD-10-CM

## 2019-06-17 ENCOUNTER — Other Ambulatory Visit: Payer: Self-pay

## 2019-06-17 ENCOUNTER — Inpatient Hospital Stay (HOSPITAL_BASED_OUTPATIENT_CLINIC_OR_DEPARTMENT_OTHER): Payer: Medicare HMO | Admitting: Internal Medicine

## 2019-06-17 ENCOUNTER — Encounter: Payer: Self-pay | Admitting: Internal Medicine

## 2019-06-17 ENCOUNTER — Inpatient Hospital Stay: Payer: Medicare HMO

## 2019-06-17 ENCOUNTER — Inpatient Hospital Stay: Payer: Medicare HMO | Attending: Internal Medicine

## 2019-06-17 VITALS — BP 174/80 | HR 69 | Temp 97.6°F | Resp 18

## 2019-06-17 DIAGNOSIS — D696 Thrombocytopenia, unspecified: Secondary | ICD-10-CM

## 2019-06-17 DIAGNOSIS — D500 Iron deficiency anemia secondary to blood loss (chronic): Secondary | ICD-10-CM

## 2019-06-17 DIAGNOSIS — D509 Iron deficiency anemia, unspecified: Secondary | ICD-10-CM | POA: Insufficient documentation

## 2019-06-17 DIAGNOSIS — D508 Other iron deficiency anemias: Secondary | ICD-10-CM

## 2019-06-17 LAB — CBC WITH DIFFERENTIAL/PLATELET
Abs Immature Granulocytes: 0.05 10*3/uL (ref 0.00–0.07)
Basophils Absolute: 0 10*3/uL (ref 0.0–0.1)
Basophils Relative: 1 %
Eosinophils Absolute: 0.3 10*3/uL (ref 0.0–0.5)
Eosinophils Relative: 5 %
HCT: 33.4 % — ABNORMAL LOW (ref 36.0–46.0)
Hemoglobin: 10.9 g/dL — ABNORMAL LOW (ref 12.0–15.0)
Immature Granulocytes: 1 %
Lymphocytes Relative: 18 %
Lymphs Abs: 1 10*3/uL (ref 0.7–4.0)
MCH: 31.6 pg (ref 26.0–34.0)
MCHC: 32.6 g/dL (ref 30.0–36.0)
MCV: 96.8 fL (ref 80.0–100.0)
Monocytes Absolute: 0.4 10*3/uL (ref 0.1–1.0)
Monocytes Relative: 8 %
Neutro Abs: 3.8 10*3/uL (ref 1.7–7.7)
Neutrophils Relative %: 67 %
Platelets: 121 10*3/uL — ABNORMAL LOW (ref 150–400)
RBC: 3.45 MIL/uL — ABNORMAL LOW (ref 3.87–5.11)
RDW: 12.6 % (ref 11.5–15.5)
WBC: 5.6 10*3/uL (ref 4.0–10.5)
nRBC: 0 % (ref 0.0–0.2)

## 2019-06-17 LAB — BASIC METABOLIC PANEL
Anion gap: 11 (ref 5–15)
BUN: 26 mg/dL — ABNORMAL HIGH (ref 8–23)
CO2: 23 mmol/L (ref 22–32)
Calcium: 9.8 mg/dL (ref 8.9–10.3)
Chloride: 104 mmol/L (ref 98–111)
Creatinine, Ser: 0.71 mg/dL (ref 0.44–1.00)
GFR calc Af Amer: >60 mL/min (ref >60)
GFR calc non Af Amer: >60 mL/min (ref >60)
Glucose, Bld: 243 mg/dL — ABNORMAL HIGH (ref 70–99)
Potassium: 4.4 mmol/L (ref 3.5–5.1)
Sodium: 138 mmol/L (ref 135–145)

## 2019-06-17 MED ORDER — IRON SUCROSE 20 MG/ML IV SOLN
200.0000 mg | Freq: Once | INTRAVENOUS | Status: AC
  Administered 2019-06-17: 200 mg via INTRAVENOUS
  Filled 2019-06-17: qty 10

## 2019-06-17 MED ORDER — SODIUM CHLORIDE 0.9 % IV SOLN
INTRAVENOUS | Status: DC
  Filled 2019-06-17: qty 250

## 2019-06-17 NOTE — Assessment & Plan Note (Addendum)
#  Chronic mild anemia-hemoglobin 10.9; dec 2020-Iron sat-14%  question iron deficiency versus others.  Recommend proceeding with Venofer/symptomatic improvement.  STABLE.   # Thrombocytopenia-platelet count-121 ITP versus others.  STABLE.  Again recommend continued follow-up.  # ? UTI- s/p followed with Urology.   # Abdominal pain- ? Diverticulitis- Awaiting visit with Dr.Magod.    # HTN- poorly controlled; cuff at home- at 120; today 170s.  Recommend checking blood pressures closely at home.  # # I discussed regarding Covid precautions/and also discussed proceeding with Covid vaccination when available.  Discussed that unfortunately the data safety and efficacy of vaccination is unclear especially in patients with immunocompromised state.  However, I think the benefits of the vaccination outweigh the potential risks. She wants to think about it [multiple allergies].  # DISPOSITION: # Venofer IV today # Venofer in 1 month # follow up in 2 months/cbc/bmp-possible Venofer-Dr.B

## 2019-06-17 NOTE — Progress Notes (Signed)
[facility] CONSULT NOTE  Patient Care Team: Tracie Harrier, MD as PCP - General (Internal Medicine) Tracie Harrier, MD as Physician Assistant (Internal Medicine) Lequita Asal, MD as Consulting Physician (Hematology and Oncology)  CHIEF COMPLAINTS/PURPOSE OF CONSULTATION: Iron deficiency anemia/thrombocytopenia  #Chronic [2015] mild anemia hemoglobin 11.1-question iron deficiency versus others.-Dr. Elliott/Dr. Mike Gip; s/p Venofer.   #Mild thrombocytopenia platelets 120s  #Fatigue/ PN/DM-   Oncology History   No history exists.    HISTORY OF PRESENTING ILLNESS:  Kim Ballard 85 y.o.  female longstanding history of iron deficient anemia/thrombocytopenia is here for follow-up.   Patient continues to complain of worsening fatigue.  Denies any blood in stools or black or stools.  Patient noted improvement of her symptoms post IV iron infusion.  Denies any easy bruising or gum bleeding or nosebleeds.  Complains of intermittent abdominal pain for which she has been evaluated by GI virtually visit.  Interested in following up in person.  Also recently evaluated by urology for a possible UTI.  Currently on medications improved.   Review of Systems  Constitutional: Positive for malaise/fatigue. Negative for chills, diaphoresis, fever and weight loss.  HENT: Negative for nosebleeds and sore throat.   Eyes: Negative for double vision.  Respiratory: Negative for cough, hemoptysis, sputum production, shortness of breath and wheezing.   Cardiovascular: Negative for chest pain, palpitations, orthopnea and leg swelling.  Gastrointestinal: Positive for abdominal pain. Negative for blood in stool, constipation, diarrhea, heartburn, melena, nausea and vomiting.  Genitourinary: Negative for dysuria, frequency and urgency.  Musculoskeletal: Negative for back pain and joint pain.  Skin: Negative.  Negative for itching and rash.  Neurological: Positive for tingling.  Negative for dizziness, focal weakness, weakness and headaches.  Endo/Heme/Allergies: Does not bruise/bleed easily.  Psychiatric/Behavioral: Negative for depression. The patient is not nervous/anxious and does not have insomnia.      MEDICAL HISTORY:  Past Medical History:  Diagnosis Date  . Allergic state   . Anemia   . Arthritis   . Cancer ([place])    skin cancers  . Chronic cystitis with hematuria   . Depression   . Diabetes mellitus without complication ([place])   . Fibrocystic disease of both breasts   . Fibromyalgia   . GERD (gastroesophageal reflux disease)   . Hypertension   . IBS (irritable bowel syndrome)   . Nausea   . Skin cancer   . Wears dentures    full upper and lower    SURGICAL HISTORY: Past Surgical History:  Procedure Laterality Date  . ABDOMINAL HYSTERECTOMY    . APPENDECTOMY    . BREAST BIOPSY Right 2001   surgical bx   . CATARACT EXTRACTION W/PHACO Right 03/08/2017   Procedure: CATARACT EXTRACTION PHACO AND INTRAOCULAR LENS PLACEMENT ([place]) RIGHT DIABETIC TORIC;  Surgeon: Leandrew Koyanagi, MD;  Location: [facility];  Service: Ophthalmology;  Laterality: Right;  Diabetic - oral meds  . CATARACT EXTRACTION W/PHACO Left 04/17/2017   Procedure: CATARACT EXTRACTION PHACO AND INTRAOCULAR LENS PLACEMENT ([place]) LEFT DIABETIC;  Surgeon: Leandrew Koyanagi, MD;  Location: [facility];  Service: Ophthalmology;  Laterality: Left;  diabetic-oral meds  . CHOLECYSTECTOMY    . COLONOSCOPY    . COLONOSCOPY WITH PROPOFOL N/A 08/27/2015   Procedure: COLONOSCOPY WITH PROPOFOL;  Surgeon: Manya Silvas, MD;  Location: [facility] ENDOSCOPY;  Service: Endoscopy;  Laterality: N/A;  . COLONOSCOPY WITH PROPOFOL N/A 05/02/2016   Procedure: COLONOSCOPY WITH PROPOFOL;  Surgeon: Manya Silvas, MD;  Location: [facility] ENDOSCOPY;  Service: Endoscopy;  Laterality: N/A;  . ESOPHAGOGASTRODUODENOSCOPY    . ESOPHAGOGASTRODUODENOSCOPY (EGD) WITH PROPOFOL N/A 08/27/2015    Procedure: ESOPHAGOGASTRODUODENOSCOPY (EGD) WITH PROPOFOL;  Surgeon: Manya Silvas, MD;  Location: [facility] ENDOSCOPY;  Service: Endoscopy;  Laterality: N/A;  . HERNIA REPAIR      SOCIAL HISTORY: Social History   Socioeconomic History  . Marital status: Married    Spouse name: Not on file  . Number of children: Not on file  . Years of education: Not on file  . Highest education level: Not on file  Occupational History  . Not on file  Tobacco Use  . Smoking status: Former Smoker    Quit date: 1960    Years since quitting: 61.0  . Smokeless tobacco: Never Used  Substance and Sexual Activity  . Alcohol use: No  . Drug use: No  . Sexual activity: Not Currently  Other Topics Concern  . Not on file  Social History Narrative  . Not on file   Social Determinants of Health   Financial Resource Strain:   . Difficulty of Paying Living Expenses: Not on file  Food Insecurity:   . Worried About Charity fundraiser in the Last Year: Not on file  . Ran Out of Food in the Last Year: Not on file  Transportation Needs:   . Lack of Transportation (Medical): Not on file  . Lack of Transportation (Non-Medical): Not on file  Physical Activity:   . Days of Exercise per Week: Not on file  . Minutes of Exercise per Session: Not on file  Stress:   . Feeling of Stress : Not on file  Social Connections:   . Frequency of Communication with Friends and Family: Not on file  . Frequency of Social Gatherings with Friends and Family: Not on file  . Attends Religious Services: Not on file  . Active Member of Clubs or Organizations: Not on file  . Attends Archivist Meetings: Not on file  . Marital Status: Not on file  Intimate Partner Violence:   . Fear of Current or Ex-Partner: Not on file  . Emotionally Abused: Not on file  . Physically Abused: Not on file  . Sexually Abused: Not on file    FAMILY HISTORY: Family History  Problem Relation Age of Onset  . Hypertension Mother   .  Ovarian cancer Paternal Grandmother   . COPD Father   . Hyperlipidemia Father   . Kidney disease Father   . Diabetes Maternal Grandmother     ALLERGIES:  is allergic to biaxin [clarithromycin]; brompheniramine maleate; ceclor [cefaclor]; ciprofloxacin; clinoril [sulindac]; codeine sulfate; diovan [valsartan]; doxycycline; erythromycin; esomeprazole; fluoxetine; guaifenesin & derivatives; levaquin [levofloxacin in d5w]; lisinopril; lodine [etodolac]; macrodantin [nitrofurantoin macrocrystal]; medrol [methylprednisolone]; mobic [meloxicam]; motrin [ibuprofen]; nexium [esomeprazole magnesium]; nitrofurantoin; norvasc [amlodipine besylate]; omnicef [cefdinir]; penicillins; prozac [fluoxetine hcl]; pseudoephedrine; reglan [metoclopramide]; seldane [terfenadine]; sulfa antibiotics; toprol xl [metoprolol tartrate]; triamterene; tussionex pennkinetic er [hydrocod polst-cpm polst er]; and vibramycin [doxycycline calcium].  MEDICATIONS:  Current Outpatient Medications  Medication Sig Dispense Refill  . Ascorbic Acid (VITAMIN C) 1000 MG tablet Take 1,000 mg by mouth 2 times daily at 12 noon and 4 pm.    . cholecalciferol (VITAMIN D3) 25 MCG (1000 UT) tablet Take 1,000 Units by mouth 2 (two) times daily.    . clidinium-chlordiazePOXIDE (LIBRAX) 5-2.5 MG capsule Take 1 capsule by mouth.    . Cranberry 405 MG CAPS Take 2 capsules by mouth 2 (two) times daily.     Marland Kitchen  ferrous sulfate 325 (65 FE) MG tablet Take 325 mg by mouth 2 (two) times daily with a meal.    . fluticasone (FLONASE) 50 MCG/ACT nasal spray Place into both nostrils daily.    Marland Kitchen gemfibrozil (LOPID) 600 MG tablet Take 600 mg by mouth 2 (two) times daily before a meal.    . glipiZIDE (GLUCOTROL XL) 2.5 MG 24 hr tablet Take 2.5 mg by mouth daily with breakfast.    . hydrochlorothiazide (HYDRODIURIL) 25 MG tablet Take 25 mg by mouth daily.    . Lactobacillus Rhamnosus, GG, ([employer] PROBIOTIC, LACTOBACILLUS, PO) Take by mouth.    . metFORMIN (GLUCOPHAGE)  500 MG tablet Take 500 mg by mouth 2 (two) times daily with a meal.    . Multiple Vitamins-Minerals (MULTIVITAMIN WITH MINERALS) tablet Take 1 tablet by mouth daily.    . OMEGA-3 FATTY ACIDS PO Take by mouth.    . pantoprazole (PROTONIX) 40 MG tablet Take 40 mg by mouth daily.    . vitamin B-12 (CYANOCOBALAMIN) 1000 MCG tablet Take 1,000 mcg by mouth daily.    . vitamin E 100 UNIT capsule Take by mouth daily.     No current facility-administered medications for this visit.   Facility-Administered Medications Ordered in Other Visits  Medication Dose Route Frequency Provider Last Rate Last Admin  . iron sucrose (VENOFER) injection 200 mg  200 mg Intravenous Once Lequita Asal, MD          .  PHYSICAL EXAMINATION: ECOG PERFORMANCE STATUS: 0 - Asymptomatic  Vitals:   06/17/19 1022  BP: (!) 175/83  Pulse: 75  Temp: 97.9 F (36.6 C)   Filed Weights   06/17/19 1022  Weight: 150 lb (68 kg)    Physical Exam  Constitutional: She is oriented to person, place, and time and well-developed, well-nourished, and in no distress.  HENT:  Head: Normocephalic and atraumatic.  Mouth/Throat: Oropharynx is clear and moist. No oropharyngeal exudate.  Eyes: Pupils are equal, round, and reactive to light.  Cardiovascular: Normal rate and regular rhythm.  Pulmonary/Chest: Effort normal and breath sounds normal. No respiratory distress. She has no wheezes.  Abdominal: Soft. Bowel sounds are normal. She exhibits no distension and no mass. There is no abdominal tenderness. There is no rebound and no guarding.  Musculoskeletal:        General: No tenderness or edema. Normal range of motion.     Cervical back: Normal range of motion and neck supple.  Neurological: She is alert and oriented to person, place, and time.  Skin: Skin is warm.  Psychiatric: Affect normal.   LABORATORY DATA:  I have reviewed the data as listed Lab Results  Component Value Date   WBC 5.6 06/17/2019   HGB 10.9 (L)  06/17/2019   HCT 33.4 (L) 06/17/2019   MCV 96.8 06/17/2019   PLT 121 (L) 06/17/2019   Recent Labs    09/11/18 1447 05/22/19 1411 06/17/19 1002  NA 141 142 138  K 3.9 4.3 4.4  CL 107 106 104  CO2 26 26 23   GLUCOSE 204* 184* 243*  BUN 23 29* 26*  CREATININE 1.22* 1.21* 0.71  CALCIUM 9.4 9.5 9.8  GFRNONAA 42* 42* >60  GFRAA 48* 48* >60  PROT  --  6.9  --   ALBUMIN  --  3.9  --   AST  --  8*  --   ALT  --  13  --   ALKPHOS  --  71  --  BILITOT  --  0.4  --     RADIOGRAPHIC STUDIES: I have personally reviewed the radiological images as listed and agreed with the findings in the report. No results found.  ASSESSMENT & PLAN:   Thrombocytopenia ([place]) #Chronic mild anemia-hemoglobin 10.9; dec 2020-Iron sat-14%  question iron deficiency versus others.  Recommend proceeding with Venofer/symptomatic improvement.  STABLE.   # Thrombocytopenia-platelet count-121 ITP versus others.  STABLE.  Again recommend continued follow-up.  # ? UTI- s/p followed with Urology.   # Abdominal pain- ? Diverticulitis- Awaiting visit with Dr.Magod.    # HTN- poorly controlled; cuff at home- at 120; today 170s.  Recommend checking blood pressures closely at home.  # # I discussed regarding Covid precautions/and also discussed proceeding with Covid vaccination when available.  Discussed that unfortunately the data safety and efficacy of vaccination is unclear especially in patients with immunocompromised state.  However, I think the benefits of the vaccination outweigh the potential risks. She wants to think about it [multiple allergies].  # DISPOSITION: # Venofer IV today # Venofer in 1 month # follow up in 2 months/cbc/bmp-possible Venofer-Dr.B    All questions were answered. The patient knows to call the clinic with any problems, questions or concerns.    Cammie Sickle, MD 06/18/2019 8:40 AM

## 2019-06-24 ENCOUNTER — Other Ambulatory Visit: Payer: Self-pay | Admitting: Internal Medicine

## 2019-06-24 DIAGNOSIS — Z1231 Encounter for screening mammogram for malignant neoplasm of breast: Secondary | ICD-10-CM

## 2019-06-26 DIAGNOSIS — R8290 Unspecified abnormal findings in urine: Secondary | ICD-10-CM | POA: Diagnosis not present

## 2019-06-26 DIAGNOSIS — E1165 Type 2 diabetes mellitus with hyperglycemia: Secondary | ICD-10-CM | POA: Diagnosis not present

## 2019-06-26 DIAGNOSIS — K5900 Constipation, unspecified: Secondary | ICD-10-CM | POA: Diagnosis not present

## 2019-06-26 DIAGNOSIS — R350 Frequency of micturition: Secondary | ICD-10-CM | POA: Diagnosis not present

## 2019-06-26 DIAGNOSIS — R1310 Dysphagia, unspecified: Secondary | ICD-10-CM | POA: Diagnosis not present

## 2019-06-26 DIAGNOSIS — R351 Nocturia: Secondary | ICD-10-CM | POA: Diagnosis not present

## 2019-06-26 DIAGNOSIS — I10 Essential (primary) hypertension: Secondary | ICD-10-CM | POA: Diagnosis not present

## 2019-06-26 DIAGNOSIS — R140 Abdominal distension (gaseous): Secondary | ICD-10-CM | POA: Diagnosis not present

## 2019-06-28 ENCOUNTER — Telehealth: Payer: Self-pay

## 2019-06-28 NOTE — Telephone Encounter (Signed)
COVID-19 Pre-Screening Questions:06/28/19  Do you currently have a fever (>100 F), chills or unexplained body aches?NO  Are you currently experiencing new cough, shortness of breath, sore throat, runny nose?NO .  Have you recently travelled outside the state of [place] in the last 14 days? NO .  Have you been in contact with someone that is currently pending confirmation of Covid19 testing or has been confirmed to have the [place] virus?  NO  **If the patient answers NO to ALL questions -  advise the patient to please call the clinic before coming to the office should any symptoms develop.

## 2019-07-01 ENCOUNTER — Telehealth: Payer: Self-pay

## 2019-07-01 ENCOUNTER — Other Ambulatory Visit: Payer: Self-pay

## 2019-07-01 ENCOUNTER — Ambulatory Visit: Payer: Medicare HMO | Admitting: Internal Medicine

## 2019-07-01 DIAGNOSIS — N390 Urinary tract infection, site not specified: Secondary | ICD-10-CM | POA: Diagnosis not present

## 2019-07-01 MED ORDER — CEPHALEXIN 500 MG PO CAPS: 500.0000 mg | ORAL_CAPSULE | Freq: Three times a day (TID) | ORAL | 0 refills | Status: DC

## 2019-07-01 NOTE — Telephone Encounter (Signed)
Per Dr. Megan Salon called [facility] for urine culture results done last week. Spoke with [facility] who will fax labs to our office today. [place]

## 2019-07-01 NOTE — Progress Notes (Signed)
[facility] for Infectious Disease  Reason for Consult: Recurrent cystitis Referring Provider: Dr. Nicki Reaper McDiarmid  Assessment: I cannot be absolutely certain that all of her dysuria is due to infection versus anatomical changes in the urinary tract.  Her symptoms are quite protracted.  I reviewed her extensive list of antibiotic allergies and intolerances.  She has been on cephalexin in the past few months and tolerating it well.  I will treat her for possible Klebsiella cystitis with cephalexin 500 mg 3 times daily for 5 days.  Plan: 1. Cephalexin for 5 days 2. Follow-up in 3 weeks  Patient Active Problem List   Diagnosis Date Noted  . Recurrent UTI 07/01/2019    Priority: High  . Fatigue 09/18/2017  . Leukopenia 04/26/2016  . Pancytopenia ([place]) 04/26/2016  . Mesenteric artery stenosis ([place]) 02/19/2016  . Hyperlipidemia 02/19/2016  . Diabetes mellitus ([place]) 02/19/2016  . Iron deficiency anemia 02/08/2016  . Anemia 01/18/2016  . Thrombocytopenia ([place]) 01/18/2016    Patient's Medications  New Prescriptions   No medications on file  Previous Medications   ASCORBIC ACID (VITAMIN C) 1000 MG TABLET    Take 1,000 mg by mouth 2 times daily at 12 noon and 4 pm.   CHOLECALCIFEROL (VITAMIN D3) 25 MCG (1000 UT) TABLET    Take 1,000 Units by mouth 2 (two) times daily.   CLIDINIUM-CHLORDIAZEPOXIDE (LIBRAX) 5-2.5 MG CAPSULE    Take 1 capsule by mouth.   CRANBERRY 405 MG CAPS    Take 2 capsules by mouth 2 (two) times daily.    ESTRADIOL (ESTRACE) 0.1 MG/GM VAGINAL CREAM       FERREX 150 150 MG CAPSULE       FERROUS SULFATE 325 (65 FE) MG TABLET    Take 325 mg by mouth 2 (two) times daily with a meal.   FLUTICASONE (FLONASE) 50 MCG/ACT NASAL SPRAY    Place into both nostrils daily.   GEMFIBROZIL (LOPID) 600 MG TABLET    Take 600 mg by mouth 2 (two) times daily before a meal.   GLIPIZIDE (GLUCOTROL XL) 2.5 MG 24 HR TABLET    Take 2.5 mg by mouth daily with breakfast.   HYDROCHLOROTHIAZIDE (HYDRODIURIL) 25 MG TABLET    Take 25 mg by mouth daily.   LACTOBACILLUS RHAMNOSUS, GG, ([employer]S PROBIOTIC, LACTOBACILLUS, PO)    Take by mouth.   METFORMIN (GLUCOPHAGE) 500 MG TABLET    Take 500 mg by mouth 2 (two) times daily with a meal.   MULTIPLE VITAMINS-MINERALS (MULTIVITAMIN WITH MINERALS) TABLET    Take 1 tablet by mouth daily.   OMEGA-3 FATTY ACIDS PO    Take by mouth.   PANTOPRAZOLE (PROTONIX) 40 MG TABLET    Take 40 mg by mouth daily.   VITAMIN B-12 (CYANOCOBALAMIN) 1000 MCG TABLET    Take 1,000 mcg by mouth daily.   VITAMIN E 100 UNIT CAPSULE    Take by mouth daily.  Modified Medications   No medications on file  Discontinued Medications   No medications on file    HPI: Kim Ballard is a 85 y.o. female with a history of hysterectomy and grade 2 rectocele.  She also has a history of recurrent bladder infections and at various times in the past has been on daily antibiotic prophylaxis.  She says she also used to use methylene blue and now is on daily Azo to help deal with burning that she notes at the tip of her urethra at  the very end of urination.  She says that she has had more problems with this over the past year.  Says that she was treated with brand-name fosfomycin last fall and noted improvement but not complete resolution of her dysuria.  She received generic fosfomycin several weeks ago and did not notice any improvement.  She has not had any fever.  She has some mild discomfort in her left flank and chronic lower abdominal discomfort.  She has an extensive list of antibiotic allergies and intolerances.  A urine culture obtained on 06/07/2019 grew Enterobacter cloacae.  A repeat urine culture on 06/28/2019 grew Klebsiella pneumonia a susceptible to all antibiotics tested.  Review of Systems: Review of Systems  Constitutional: Negative for chills, diaphoresis, fever and weight loss.  HENT: Negative for congestion and sore throat.   Respiratory: Negative for  cough and shortness of breath.   Cardiovascular: Negative for chest pain.  Gastrointestinal: Positive for abdominal pain. Negative for diarrhea, nausea and vomiting.  Genitourinary: Positive for dysuria and flank pain. Negative for frequency, hematuria and urgency.  Neurological: Negative for headaches.      Past Medical History:  Diagnosis Date  . Allergic state   . Anemia   . Arthritis   . Cancer ([place])    skin cancers  . Chronic cystitis with hematuria   . Depression   . Diabetes mellitus without complication ([place])   . Fibrocystic disease of both breasts   . Fibromyalgia   . GERD (gastroesophageal reflux disease)   . Hypertension   . IBS (irritable bowel syndrome)   . Nausea   . Skin cancer   . Wears dentures    full upper and lower    Social History   Tobacco Use  . Smoking status: Former Smoker    Quit date: 1960    Years since quitting: 61.1  . Smokeless tobacco: Never Used  Substance Use Topics  . Alcohol use: No  . Drug use: No    Family History  Problem Relation Age of Onset  . Hypertension Mother   . Ovarian cancer Paternal Grandmother   . COPD Father   . Hyperlipidemia Father   . Kidney disease Father   . Diabetes Maternal Grandmother    Allergies  Allergen Reactions  . Biaxin [Clarithromycin] Other (See Comments)    GI UPSET   . Brompheniramine Maleate   . Ceclor [Cefaclor]   . Ciprofloxacin   . Clinoril [Sulindac]   . Codeine Sulfate   . Diovan [Valsartan]   . Doxycycline     Able to tolerate low dose  . Erythromycin   . Esomeprazole   . Fluoxetine Other (See Comments)    UNKNOWN  . Guaifenesin & Derivatives   . Levaquin [Levofloxacin In D5w]     Can take in low doses  . Lisinopril   . Lodine [Etodolac]   . Macrodantin [Nitrofurantoin Macrocrystal]   . Medrol [Methylprednisolone]   . Mobic [Meloxicam]   . Motrin [Ibuprofen]   . Nexium [Esomeprazole Magnesium]   . Nitrofurantoin Other (See Comments)  . Norvasc [Amlodipine  Besylate]   . Omnicef [Cefdinir] Other (See Comments)    Developed whelps 10 days laster   . Penicillins   . Prozac [Fluoxetine Hcl] Other (See Comments)    Makes her feel crazy   . Pseudoephedrine   . Reglan [Metoclopramide]   . Seldane [Terfenadine]   . Sulfa Antibiotics Other (See Comments)  . Toprol Xl [Metoprolol Tartrate]   . Triamterene   .  Tussionex Pennkinetic Er [Hydrocod Polst-Cpm Polst Er]   . Vibramycin [Doxycycline Calcium]     OBJECTIVE: Vitals:   07/01/19 1514  BP: (!) 182/89  Pulse: 85  Temp: 98.5 F (36.9 C)  TempSrc: Oral  SpO2: 99%  Weight: 148 lb (67.1 kg)  Height: 5\' 7"  (1.702 m)   Body mass index is 23.18 kg/m.   Physical Exam Constitutional:      Comments: She is very pleasant and talkative.  Cardiovascular:     Rate and Rhythm: Normal rate and regular rhythm.     Heart sounds: No murmur.  Pulmonary:     Effort: Pulmonary effort is normal.     Breath sounds: Normal breath sounds.  Abdominal:     Palpations: Abdomen is soft.     Tenderness: There is no right CVA tenderness or left CVA tenderness.     Comments: She has mild discomfort with palpation of her lower abdomen bilaterally.  Skin:    Findings: No rash.  Psychiatric:        Mood and Affect: Mood normal.     Microbiology: No results found for this or any previous visit (from the past 240 hour(s)).  Michel Bickers, MD [facility] [phone] pager   [phone] cell 07/01/2019, 3:33 PM

## 2019-07-03 DIAGNOSIS — E119 Type 2 diabetes mellitus without complications: Secondary | ICD-10-CM | POA: Diagnosis not present

## 2019-07-03 DIAGNOSIS — K589 Irritable bowel syndrome without diarrhea: Secondary | ICD-10-CM | POA: Diagnosis not present

## 2019-07-03 DIAGNOSIS — N390 Urinary tract infection, site not specified: Secondary | ICD-10-CM | POA: Diagnosis not present

## 2019-07-03 DIAGNOSIS — I10 Essential (primary) hypertension: Secondary | ICD-10-CM | POA: Diagnosis not present

## 2019-07-03 DIAGNOSIS — Z0000 Encounter for general adult medical examination without abnormal findings: Secondary | ICD-10-CM | POA: Diagnosis not present

## 2019-07-03 DIAGNOSIS — Z79899 Other long term (current) drug therapy: Secondary | ICD-10-CM | POA: Diagnosis not present

## 2019-07-03 DIAGNOSIS — D731 Hypersplenism: Secondary | ICD-10-CM | POA: Diagnosis not present

## 2019-07-03 DIAGNOSIS — F411 Generalized anxiety disorder: Secondary | ICD-10-CM | POA: Diagnosis not present

## 2019-07-03 DIAGNOSIS — B9689 Other specified bacterial agents as the cause of diseases classified elsewhere: Secondary | ICD-10-CM | POA: Diagnosis not present

## 2019-07-15 ENCOUNTER — Other Ambulatory Visit: Payer: Self-pay

## 2019-07-15 ENCOUNTER — Inpatient Hospital Stay: Payer: Medicare HMO | Attending: Hematology and Oncology

## 2019-07-15 VITALS — BP 172/80 | HR 84 | Resp 18

## 2019-07-15 DIAGNOSIS — D649 Anemia, unspecified: Secondary | ICD-10-CM | POA: Diagnosis not present

## 2019-07-15 DIAGNOSIS — E611 Iron deficiency: Secondary | ICD-10-CM | POA: Diagnosis not present

## 2019-07-15 DIAGNOSIS — D509 Iron deficiency anemia, unspecified: Secondary | ICD-10-CM

## 2019-07-15 DIAGNOSIS — D500 Iron deficiency anemia secondary to blood loss (chronic): Secondary | ICD-10-CM

## 2019-07-15 MED ORDER — SODIUM CHLORIDE 0.9 % IV SOLN
INTRAVENOUS | Status: DC
  Filled 2019-07-15: qty 250

## 2019-07-15 MED ORDER — IRON SUCROSE 20 MG/ML IV SOLN
200.0000 mg | Freq: Once | INTRAVENOUS | Status: AC
  Administered 2019-07-15: 14:00:00 200 mg via INTRAVENOUS

## 2019-07-22 DIAGNOSIS — Z1211 Encounter for screening for malignant neoplasm of colon: Secondary | ICD-10-CM | POA: Diagnosis not present

## 2019-07-22 DIAGNOSIS — R3989 Other symptoms and signs involving the genitourinary system: Secondary | ICD-10-CM | POA: Diagnosis not present

## 2019-07-24 ENCOUNTER — Encounter: Payer: Self-pay | Admitting: Internal Medicine

## 2019-07-24 ENCOUNTER — Ambulatory Visit: Payer: Medicare HMO | Admitting: Internal Medicine

## 2019-07-24 ENCOUNTER — Other Ambulatory Visit: Payer: Self-pay

## 2019-07-24 DIAGNOSIS — N390 Urinary tract infection, site not specified: Secondary | ICD-10-CM | POA: Diagnosis not present

## 2019-07-24 NOTE — Progress Notes (Signed)
[facility]  Patient Active Problem List   Diagnosis Date Noted  . Recurrent UTI 07/01/2019    Priority: High  . Fatigue 09/18/2017  . Leukopenia 04/26/2016  . Pancytopenia ([place]) 04/26/2016  . Mesenteric artery stenosis ([place]) 02/19/2016  . Hyperlipidemia 02/19/2016  . Diabetes mellitus ([place]) 02/19/2016  . Iron deficiency anemia 02/08/2016  . Anemia 01/18/2016  . Thrombocytopenia ([place]) 01/18/2016    Patient's Medications  New Prescriptions   No medications on file  Previous Medications   ASCORBIC ACID (VITAMIN C) 1000 MG TABLET    Take 1,000 mg by mouth 2 times daily at 12 noon and 4 pm.   CHOLECALCIFEROL (VITAMIN D3) 25 MCG (1000 UT) TABLET    Take 1,000 Units by mouth 2 (two) times daily.   CLIDINIUM-CHLORDIAZEPOXIDE (LIBRAX) 5-2.5 MG CAPSULE    Take 1 capsule by mouth.   CRANBERRY 405 MG CAPS    Take 2 capsules by mouth 2 (two) times daily.    ESTRADIOL (ESTRACE) 0.1 MG/GM VAGINAL CREAM       FERREX 150 150 MG CAPSULE       FERROUS SULFATE 325 (65 FE) MG TABLET    Take 325 mg by mouth 2 (two) times daily with a meal.   FLUTICASONE (FLONASE) 50 MCG/ACT NASAL SPRAY    Place into both nostrils daily.   GEMFIBROZIL (LOPID) 600 MG TABLET    Take 600 mg by mouth 2 (two) times daily before a meal.   GLIPIZIDE (GLUCOTROL XL) 2.5 MG 24 HR TABLET    Take 2.5 mg by mouth daily with breakfast.   HYDROCHLOROTHIAZIDE (HYDRODIURIL) 25 MG TABLET    Take 25 mg by mouth daily.   LACTOBACILLUS RHAMNOSUS, GG, ([employer]S PROBIOTIC, LACTOBACILLUS, PO)    Take by mouth.   METFORMIN (GLUCOPHAGE) 500 MG TABLET    Take 500 mg by mouth 2 (two) times daily with a meal.   MULTIPLE VITAMINS-MINERALS (MULTIVITAMIN WITH MINERALS) TABLET    Take 1 tablet by mouth daily.   OMEGA-3 FATTY ACIDS PO    Take by mouth.   PANTOPRAZOLE (PROTONIX) 40 MG TABLET    Take 40 mg by mouth daily.   VITAMIN B-12 (CYANOCOBALAMIN) 1000 MCG TABLET    Take 1,000 mcg by mouth daily.   VITAMIN E  100 UNIT CAPSULE    Take by mouth daily.  Modified Medications   No medications on file  Discontinued Medications   CEPHALEXIN (KEFLEX) 500 MG CAPSULE    Take 1 capsule (500 mg total) by mouth 3 (three) times daily.    Subjective: Ms. Kutter is in for her routine follow-up visit.  Following her visit 1 month ago she took cephalexin for 5 days for asymptomatic E. coli UTI.  She had prompt resolution of her dysuria and it has not recurred.  She saw her gynecologist 2 days ago and her urinalysis was completely normal without pyuria.  She is still debating whether or not to get the Covid vaccine.  Review of Systems: Review of Systems  Constitutional: Negative for chills, diaphoresis and fever.  Gastrointestinal: Negative for diarrhea, nausea and vomiting.       She says that her lower abdomen continues to be "touchy" been for many years.  Genitourinary: Negative for dysuria, frequency and urgency.       She does have some problems with stress incontinence.    Past Medical History:  Diagnosis Date  . Allergic state   .  Anemia   . Arthritis   . Cancer ([place])    skin cancers  . Chronic cystitis with hematuria   . Depression   . Diabetes mellitus without complication ([place])   . Fibrocystic disease of both breasts   . Fibromyalgia   . GERD (gastroesophageal reflux disease)   . Hypertension   . IBS (irritable bowel syndrome)   . Nausea   . Skin cancer   . Wears dentures    full upper and lower    Social History   Tobacco Use  . Smoking status: Former Smoker    Quit date: 1960    Years since quitting: 61.1  . Smokeless tobacco: Never Used  Substance Use Topics  . Alcohol use: No  . Drug use: No    Family History  Problem Relation Age of Onset  . Hypertension Mother   . Ovarian cancer Paternal Grandmother   . COPD Father   . Hyperlipidemia Father   . Kidney disease Father   . Diabetes Maternal Grandmother     Allergies  Allergen Reactions  . Biaxin [Clarithromycin]  Other (See Comments)    GI UPSET   . Brompheniramine Maleate   . Ceclor [Cefaclor]   . Ciprofloxacin   . Clinoril [Sulindac]   . Codeine Sulfate   . Diovan [Valsartan]   . Doxycycline     Able to tolerate low dose  . Erythromycin   . Esomeprazole   . Fluoxetine Other (See Comments)    UNKNOWN  . Guaifenesin & Derivatives   . Levaquin [Levofloxacin In D5w]     Can take in low doses  . Lisinopril   . Lodine [Etodolac]   . Macrodantin [Nitrofurantoin Macrocrystal]   . Medrol [Methylprednisolone]   . Mobic [Meloxicam]   . Motrin [Ibuprofen]   . Nexium [Esomeprazole Magnesium]   . Nitrofurantoin Other (See Comments)  . Norvasc [Amlodipine Besylate]   . Omnicef [Cefdinir] Other (See Comments)    Developed whelps 10 days laster   . Penicillins   . Prozac [Fluoxetine Hcl] Other (See Comments)    Makes her feel crazy   . Pseudoephedrine   . Reglan [Metoclopramide]   . Seldane [Terfenadine]   . Sulfa Antibiotics Other (See Comments)  . Toprol Xl [Metoprolol Tartrate]   . Triamterene   . Tussionex Pennkinetic Er [Hydrocod Polst-Cpm Polst Er]   . Vibramycin [Doxycycline Calcium]     Objective: Vitals:   07/24/19 1417  Weight: 149 lb (67.6 kg)   Body mass index is 23.34 kg/m.  Physical Exam Constitutional:      Comments: She is pleasant and in no distress.  Cardiovascular:     Rate and Rhythm: Normal rate and regular rhythm.     Heart sounds: No murmur.  Pulmonary:     Effort: Pulmonary effort is normal.     Breath sounds: Normal breath sounds.  Abdominal:     Palpations: Abdomen is soft.     Tenderness: There is no abdominal tenderness. There is no right CVA tenderness or left CVA tenderness.  Skin:    Findings: No rash.  Psychiatric:        Mood and Affect: Mood normal.     Lab Results    Problem List Items Addressed This Visit      High   Recurrent UTI    Her latest symptomatic UTI responded well and completely to 5 days of cephalexin.  Repeat  urinalysis has normalized.  Recommend observation off of antibiotics.  I told  her that I would not recommend attempting chronic suppression with cephalexin as it would carry a high risk of antibiotic associated diarrhea and her normal flora would probably developed resistance to cephalexin fairly quickly.  I would reserve it for future treatment as needed.  I did tell her that I strongly recommend she get the Covid vaccine as soon as possible.  She can follow-up here as needed.          Michel Bickers, MD [facility] [phone] pager   [phone] cell 07/24/2019, 2:35 PM

## 2019-07-24 NOTE — Assessment & Plan Note (Signed)
Her latest symptomatic UTI responded well and completely to 5 days of cephalexin.  Repeat urinalysis has normalized.  Recommend observation off of antibiotics.  I told her that I would not recommend attempting chronic suppression with cephalexin as it would carry a high risk of antibiotic associated diarrhea and her normal flora would probably developed resistance to cephalexin fairly quickly.  I would reserve it for future treatment as needed.  I did tell her that I strongly recommend she get the Covid vaccine as soon as possible.  She can follow-up here as needed.

## 2019-07-26 DIAGNOSIS — M7989 Other specified soft tissue disorders: Secondary | ICD-10-CM | POA: Diagnosis not present

## 2019-07-26 DIAGNOSIS — S93402A Sprain of unspecified ligament of left ankle, initial encounter: Secondary | ICD-10-CM | POA: Diagnosis not present

## 2019-07-26 DIAGNOSIS — S79922A Unspecified injury of left thigh, initial encounter: Secondary | ICD-10-CM | POA: Diagnosis not present

## 2019-07-26 DIAGNOSIS — S8992XA Unspecified injury of left lower leg, initial encounter: Secondary | ICD-10-CM | POA: Diagnosis not present

## 2019-07-26 DIAGNOSIS — M25572 Pain in left ankle and joints of left foot: Secondary | ICD-10-CM | POA: Diagnosis not present

## 2019-07-26 DIAGNOSIS — W010XXA Fall on same level from slipping, tripping and stumbling without subsequent striking against object, initial encounter: Secondary | ICD-10-CM | POA: Diagnosis not present

## 2019-07-29 DIAGNOSIS — S79922A Unspecified injury of left thigh, initial encounter: Secondary | ICD-10-CM | POA: Diagnosis not present

## 2019-07-29 DIAGNOSIS — M7989 Other specified soft tissue disorders: Secondary | ICD-10-CM | POA: Diagnosis not present

## 2019-07-29 DIAGNOSIS — M25572 Pain in left ankle and joints of left foot: Secondary | ICD-10-CM | POA: Diagnosis not present

## 2019-08-05 DIAGNOSIS — M1712 Unilateral primary osteoarthritis, left knee: Secondary | ICD-10-CM | POA: Diagnosis not present

## 2019-08-09 ENCOUNTER — Other Ambulatory Visit: Payer: Self-pay

## 2019-08-09 ENCOUNTER — Encounter: Payer: Self-pay | Admitting: Internal Medicine

## 2019-08-12 ENCOUNTER — Other Ambulatory Visit: Payer: Self-pay

## 2019-08-12 ENCOUNTER — Inpatient Hospital Stay: Payer: Medicare HMO | Attending: Internal Medicine

## 2019-08-12 ENCOUNTER — Inpatient Hospital Stay: Payer: Medicare HMO

## 2019-08-12 ENCOUNTER — Inpatient Hospital Stay (HOSPITAL_BASED_OUTPATIENT_CLINIC_OR_DEPARTMENT_OTHER): Payer: Medicare HMO | Admitting: Internal Medicine

## 2019-08-12 VITALS — BP 153/75 | HR 79 | Resp 18

## 2019-08-12 DIAGNOSIS — E611 Iron deficiency: Secondary | ICD-10-CM | POA: Diagnosis not present

## 2019-08-12 DIAGNOSIS — D509 Iron deficiency anemia, unspecified: Secondary | ICD-10-CM | POA: Insufficient documentation

## 2019-08-12 DIAGNOSIS — D696 Thrombocytopenia, unspecified: Secondary | ICD-10-CM

## 2019-08-12 LAB — BASIC METABOLIC PANEL
Anion gap: 11 (ref 5–15)
BUN: 38 mg/dL — ABNORMAL HIGH (ref 8–23)
CO2: 26 mmol/L (ref 22–32)
Calcium: 10 mg/dL (ref 8.9–10.3)
Chloride: 101 mmol/L (ref 98–111)
Creatinine, Ser: 1.11 mg/dL — ABNORMAL HIGH (ref 0.44–1.00)
GFR calc Af Amer: 54 mL/min — ABNORMAL LOW (ref >60)
GFR calc non Af Amer: 46 mL/min — ABNORMAL LOW (ref >60)
Glucose, Bld: 185 mg/dL — ABNORMAL HIGH (ref 70–99)
Potassium: 4.3 mmol/L (ref 3.5–5.1)
Sodium: 138 mmol/L (ref 135–145)

## 2019-08-12 LAB — CBC WITH DIFFERENTIAL/PLATELET
Abs Immature Granulocytes: 0.15 10*3/uL — ABNORMAL HIGH (ref 0.00–0.07)
Basophils Absolute: 0 10*3/uL (ref 0.0–0.1)
Basophils Relative: 0 %
Eosinophils Absolute: 0.2 10*3/uL (ref 0.0–0.5)
Eosinophils Relative: 2 %
HCT: 35.8 % — ABNORMAL LOW (ref 36.0–46.0)
Hemoglobin: 12.1 g/dL (ref 12.0–15.0)
Immature Granulocytes: 2 %
Lymphocytes Relative: 11 %
Lymphs Abs: 1.1 10*3/uL (ref 0.7–4.0)
MCH: 32.2 pg (ref 26.0–34.0)
MCHC: 33.8 g/dL (ref 30.0–36.0)
MCV: 95.2 fL (ref 80.0–100.0)
Monocytes Absolute: 0.6 10*3/uL (ref 0.1–1.0)
Monocytes Relative: 6 %
Neutro Abs: 7.8 10*3/uL — ABNORMAL HIGH (ref 1.7–7.7)
Neutrophils Relative %: 79 %
Platelets: 160 10*3/uL (ref 150–400)
RBC: 3.76 MIL/uL — ABNORMAL LOW (ref 3.87–5.11)
RDW: 12.6 % (ref 11.5–15.5)
WBC: 9.9 10*3/uL (ref 4.0–10.5)
nRBC: 0 % (ref 0.0–0.2)

## 2019-08-12 MED ORDER — IRON SUCROSE 20 MG/ML IV SOLN
200.0000 mg | Freq: Once | INTRAVENOUS | Status: AC
  Administered 2019-08-12: 200 mg via INTRAVENOUS
  Filled 2019-08-12: qty 10

## 2019-08-12 MED ORDER — SODIUM CHLORIDE 0.9 % IV SOLN
INTRAVENOUS | Status: DC
  Filled 2019-08-12: qty 250

## 2019-08-12 NOTE — Progress Notes (Signed)
[facility] CONSULT NOTE  Patient Care Team: Tracie Harrier, MD as PCP - General (Internal Medicine) Tracie Harrier, MD as Physician Assistant (Internal Medicine) Lequita Asal, MD as Consulting Physician (Hematology and Oncology)  CHIEF COMPLAINTS/PURPOSE OF CONSULTATION: Iron deficiency anemia/thrombocytopenia  #Chronic [2015] mild anemia hemoglobin 11.1-question iron deficiency versus others.-Dr. Elliott/Dr. Mike Gip; s/p Venofer.   #Mild thrombocytopenia platelets 120s  #Fatigue/ PN/DM-   Oncology History   No history exists.    HISTORY OF PRESENTING ILLNESS:  Kim Ballard 85 y.o.  female longstanding history of iron deficient anemia/thrombocytopenia is here for follow-up.  States that she had a mechanical fall recently. She denies any blood in stools or black or stools.   Patient continues to complain of fatigue.  She does have improvement of her symptoms post IV iron.  Review of Systems  Constitutional: Positive for malaise/fatigue. Negative for chills, diaphoresis, fever and weight loss.  HENT: Negative for nosebleeds and sore throat.   Eyes: Negative for double vision.  Respiratory: Negative for cough, hemoptysis, sputum production, shortness of breath and wheezing.   Cardiovascular: Negative for chest pain, palpitations, orthopnea and leg swelling.  Gastrointestinal: Positive for abdominal pain. Negative for blood in stool, constipation, diarrhea, heartburn, melena, nausea and vomiting.  Genitourinary: Negative for dysuria, frequency and urgency.  Musculoskeletal: Negative for back pain and joint pain.  Skin: Negative.  Negative for itching and rash.  Neurological: Positive for tingling. Negative for dizziness, focal weakness, weakness and headaches.  Endo/Heme/Allergies: Does not bruise/bleed easily.  Psychiatric/Behavioral: Negative for depression. The patient is not nervous/anxious and does not have insomnia.      MEDICAL HISTORY:   Past Medical History:  Diagnosis Date  . Allergic state   . Anemia   . Arthritis   . Cancer ([place])    skin cancers  . Chronic cystitis with hematuria   . Depression   . Diabetes mellitus without complication ([place])   . Fibrocystic disease of both breasts   . Fibromyalgia   . GERD (gastroesophageal reflux disease)   . Hypertension   . IBS (irritable bowel syndrome)   . Nausea   . Skin cancer   . Wears dentures    full upper and lower    SURGICAL HISTORY: Past Surgical History:  Procedure Laterality Date  . ABDOMINAL HYSTERECTOMY    . APPENDECTOMY    . BREAST BIOPSY Right 2001   surgical bx   . CATARACT EXTRACTION W/PHACO Right 03/08/2017   Procedure: CATARACT EXTRACTION PHACO AND INTRAOCULAR LENS PLACEMENT ([place]) RIGHT DIABETIC TORIC;  Surgeon: Leandrew Koyanagi, MD;  Location: [facility];  Service: Ophthalmology;  Laterality: Right;  Diabetic - oral meds  . CATARACT EXTRACTION W/PHACO Left 04/17/2017   Procedure: CATARACT EXTRACTION PHACO AND INTRAOCULAR LENS PLACEMENT ([place]) LEFT DIABETIC;  Surgeon: Leandrew Koyanagi, MD;  Location: [facility];  Service: Ophthalmology;  Laterality: Left;  diabetic-oral meds  . CHOLECYSTECTOMY    . COLONOSCOPY    . COLONOSCOPY WITH PROPOFOL N/A 08/27/2015   Procedure: COLONOSCOPY WITH PROPOFOL;  Surgeon: Manya Silvas, MD;  [facility] END[facility]COPY;  Service: Endoscopy;  Laterality: N/A;  . COLONOSCOPY WITH PROPOFOL N/A 05/02/2016   Procedure: COLONOSCOPY WITH PROPOFOL;  Surgeon: Manya Silvas, MD;  Location: [facility] ENDOSCOPY;  Service: Endoscopy;  Laterality: N/A;  . ESOPHAGOGASTRODUODENOSCOPY    . ESOPHAGOGASTRODUODENOSCOPY (EGD) WITH PROPOFOL N/A 08/27/2015   Procedure: ESOPHAGOGASTRODUODENOSCOPY (EGD) WITH PROPOFOL;  Surgeon: Manya Silvas, MD;  Location: [facility] ENDOSCOPY;  Service: Endoscopy;  Laterality:  N/A;  . HERNIA REPAIR      SOCIAL HISTORY: Social History   Socioeconomic History  . Marital status:  Married    Spouse name: Not on file  . Number of children: Not on file  . Years of education: Not on file  . Highest education level: Not on file  Occupational History  . Not on file  Tobacco Use  . Smoking status: Former Smoker    Quit date: 1960    Years since quitting: 61.2  . Smokeless tobacco: Never Used  Substance and Sexual Activity  . Alcohol use: No  . Drug use: No  . Sexual activity: Not Currently  Other Topics Concern  . Not on file  Social History Narrative  . Not on file   Social Determinants of Health   Financial Resource Strain:   . Difficulty of Paying Living Expenses: Not on file  Food Insecurity:   . Worried About Charity fundraiser in the Last Year: Not on file  . Ran Out of Food in the Last Year: Not on file  Transportation Needs:   . Lack of Transportation (Medical): Not on file  . Lack of Transportation (Non-Medical): Not on file  Physical Activity:   . Days of Exercise per Week: Not on file  . Minutes of Exercise per Session: Not on file  Stress:   . Feeling of Stress : Not on file  Social Connections:   . Frequency of Communication with Friends and Family: Not on file  . Frequency of Social Gatherings with Friends and Family: Not on file  . Attends Religious Services: Not on file  . Active Member of Clubs or Organizations: Not on file  . Attends Archivist Meetings: Not on file  . Marital Status: Not on file  Intimate Partner Violence:   . Fear of Current or Ex-Partner: Not on file  . Emotionally Abused: Not on file  . Physically Abused: Not on file  . Sexually Abused: Not on file    FAMILY HISTORY: Family History  Problem Relation Age of Onset  . Hypertension Mother   . Ovarian cancer Paternal Grandmother   . COPD Father   . Hyperlipidemia Father   . Kidney disease Father   . Diabetes Maternal Grandmother     ALLERGIES:  is allergic to biaxin [clarithromycin]; brompheniramine maleate; ceclor [cefaclor]; ciprofloxacin;  clinoril [sulindac]; codeine sulfate; diovan [valsartan]; doxycycline; erythromycin; esomeprazole; fluoxetine; guaifenesin & derivatives; levaquin [levofloxacin in d5w]; lisinopril; lodine [etodolac]; macrodantin [nitrofurantoin macrocrystal]; medrol [methylprednisolone]; mobic [meloxicam]; motrin [ibuprofen]; nexium [esomeprazole magnesium]; nitrofurantoin; norvasc [amlodipine besylate]; omnicef [cefdinir]; penicillins; prozac [fluoxetine hcl]; pseudoephedrine; reglan [metoclopramide]; seldane [terfenadine]; sulfa antibiotics; toprol xl [metoprolol tartrate]; triamterene; tussionex pennkinetic er [hydrocod polst-cpm polst er]; and vibramycin [doxycycline calcium].  MEDICATIONS:  Current Outpatient Medications  Medication Sig Dispense Refill  . Ascorbic Acid (VITAMIN C) 1000 MG tablet Take 1,000 mg by mouth 2 times daily at 12 noon and 4 pm.    . cholecalciferol (VITAMIN D3) 25 MCG (1000 UT) tablet Take 1,000 Units by mouth 2 (two) times daily.    . clidinium-chlordiazePOXIDE (LIBRAX) 5-2.5 MG capsule Take 1 capsule by mouth.    . Cranberry 405 MG CAPS Take 2 capsules by mouth 2 (two) times daily.     Marland Kitchen estradiol (ESTRACE) 0.1 MG/GM vaginal cream     . FERREX 150 150 MG capsule     . ferrous sulfate 325 (65 FE) MG tablet Take 325 mg by mouth 2 (two) times daily  with a meal.    . fluticasone (FLONASE) 50 MCG/ACT nasal spray Place into both nostrils daily.    Marland Kitchen gemfibrozil (LOPID) 600 MG tablet Take 600 mg by mouth 2 (two) times daily before a meal.    . glipiZIDE (GLUCOTROL XL) 2.5 MG 24 hr tablet Take 2.5 mg by mouth daily with breakfast.    . hydrochlorothiazide (HYDRODIURIL) 25 MG tablet Take 25 mg by mouth daily.    . Lactobacillus Rhamnosus, GG, (CVS PROBIOTIC, LACTOBACILLUS, PO) Take by mouth.    . metFORMIN (GLUCOPHAGE) 500 MG tablet Take 500 mg by mouth 2 (two) times daily with a meal.    . Multiple Vitamins-Minerals (MULTIVITAMIN WITH MINERALS) tablet Take 1 tablet by mouth daily.    .  OMEGA-3 FATTY ACIDS PO Take by mouth.    . pantoprazole (PROTONIX) 40 MG tablet Take 40 mg by mouth daily.    . vitamin B-12 (CYANOCOBALAMIN) 1000 MCG tablet Take 1,000 mcg by mouth daily.    . vitamin E 100 UNIT capsule Take by mouth daily.     No current facility-administered medications for this visit.   Facility-Administered Medications Ordered in Other Visits  Medication Dose Route Frequency Provider Last Rate Last Admin  . iron sucrose (VENOFER) injection 200 mg  200 mg Intravenous Once Corcoran, Melissa C, MD          .  PHYSICAL EXAMINATION: ECOG PERFORMANCE STATUS: 0 - Asymptomatic  Vitals:   08/12/19 1318  BP: (!) 174/83  Pulse: 85  Resp: 20  Temp: 98 F (36.7 C)   Filed Weights   08/12/19 1318  Weight: 146 lb 6.4 oz (66.4 kg)    Physical Exam  Constitutional: She is oriented to person, place, and time and well-developed, well-nourished, and in no distress.  HENT:  Head: Normocephalic and atraumatic.  Mouth/Throat: Oropharynx is clear and moist. No oropharyngeal exudate.  Eyes: Pupils are equal, round, and reactive to light.  Cardiovascular: Normal rate and regular rhythm.  Pulmonary/Chest: Effort normal and breath sounds normal. No respiratory distress. She has no wheezes.  Abdominal: Soft. Bowel sounds are normal. She exhibits no distension and no mass. There is no abdominal tenderness. There is no rebound and no guarding.  Musculoskeletal:        General: No tenderness or edema. Normal range of motion.     Cervical back: Normal range of motion and neck supple.  Neurological: She is alert and oriented to person, place, and time.  Skin: Skin is warm.  Psychiatric: Affect normal.   LABORATORY DATA:  I have reviewed the data as listed Lab Results  Component Value Date   WBC 9.9 08/12/2019   HGB 12.1 08/12/2019   HCT 35.8 (L) 08/12/2019   MCV 95.2 08/12/2019   PLT 160 08/12/2019   Recent Labs    05/22/19 1411 06/17/19 1002 08/12/19 1301  NA 142  138 138  K 4.3 4.4 4.3  CL 106 104 101  CO2 26 23 26   GLUCOSE 184* 243* 185*  BUN 29* 26* 38*  CREATININE 1.21* 0.71 1.11*  CALCIUM 9.5 9.8 10.0  GFRNONAA 42* >60 46*  GFRAA 48* >60 54*  PROT 6.9  --   --   ALBUMIN 3.9  --   --   AST 8*  --   --   ALT 13  --   --   ALKPHOS 71  --   --   BILITOT 0.4  --   --     RADIOGRAPHIC STUDIES:  I have personally reviewed the radiological images as listed and agreed with the findings in the report. No results found.  ASSESSMENT & PLAN:   Thrombocytopenia (H[facility])      Iron deficiency #Chronic mild anemia-hemoglobin 10.9; dec 2020-Iron sat-14%; s/p Venofer improved; Hb 12.2.   # Thrombocytopenia-platelet count-121 ITP versus others. Stable- today 160.   # ? UTI- s/p followed with Urology.  Stable  # Abdominal pain- ? Diverticulitis-improved; Dr.Magod.   # DISPOSITION: # Venofer IV today # follow up in 4 months/cbc/bmp-possible Venofer-Dr.B All questions were answered. The patient knows to call the clinic with any problems, questions or concerns.    Cammie Sickle, MD 08/13/2019 6:19 AM

## 2019-08-13 DIAGNOSIS — E611 Iron deficiency: Secondary | ICD-10-CM | POA: Insufficient documentation

## 2019-08-13 NOTE — Assessment & Plan Note (Signed)
#  Chronic mild anemia-hemoglobin 10.9; dec 2020-Iron sat-14%; s/p Venofer improved; Hb 12.2.   # Thrombocytopenia-platelet count-121 ITP versus others. Stable- today 160.   # ? UTI- s/p followed with Urology.  Stable  # Abdominal pain- ? Diverticulitis-improved; Dr.Magod.   # DISPOSITION: # Venofer IV today # follow up in 4 months/cbc/bmp-possible Venofer-Dr.B

## 2019-08-20 DIAGNOSIS — M1712 Unilateral primary osteoarthritis, left knee: Secondary | ICD-10-CM | POA: Diagnosis not present

## 2019-08-28 DIAGNOSIS — K219 Gastro-esophageal reflux disease without esophagitis: Secondary | ICD-10-CM | POA: Diagnosis not present

## 2019-08-28 DIAGNOSIS — R198 Other specified symptoms and signs involving the digestive system and abdomen: Secondary | ICD-10-CM | POA: Diagnosis not present

## 2019-08-28 DIAGNOSIS — R1310 Dysphagia, unspecified: Secondary | ICD-10-CM | POA: Diagnosis not present

## 2019-08-29 DIAGNOSIS — R351 Nocturia: Secondary | ICD-10-CM | POA: Diagnosis not present

## 2019-08-29 DIAGNOSIS — Z87440 Personal history of urinary (tract) infections: Secondary | ICD-10-CM | POA: Diagnosis not present

## 2019-08-29 DIAGNOSIS — R350 Frequency of micturition: Secondary | ICD-10-CM | POA: Diagnosis not present

## 2019-09-06 ENCOUNTER — Ambulatory Visit
Admission: RE | Admit: 2019-09-06 | Discharge: 2019-09-06 | Disposition: A | Discharge status: Home / Self Care | Payer: Medicare HMO | Source: Ambulatory Visit | Attending: Internal Medicine | Admitting: Internal Medicine

## 2019-09-06 DIAGNOSIS — Z1231 Encounter for screening mammogram for malignant neoplasm of breast: Secondary | ICD-10-CM | POA: Diagnosis not present

## 2019-10-02 DIAGNOSIS — R350 Frequency of micturition: Secondary | ICD-10-CM | POA: Diagnosis not present

## 2019-10-02 DIAGNOSIS — N390 Urinary tract infection, site not specified: Secondary | ICD-10-CM | POA: Diagnosis not present

## 2019-10-02 DIAGNOSIS — R351 Nocturia: Secondary | ICD-10-CM | POA: Diagnosis not present

## 2019-10-25 DIAGNOSIS — R194 Change in bowel habit: Secondary | ICD-10-CM | POA: Diagnosis not present

## 2019-10-25 DIAGNOSIS — R1011 Right upper quadrant pain: Secondary | ICD-10-CM | POA: Diagnosis not present

## 2019-10-25 DIAGNOSIS — Z87440 Personal history of urinary (tract) infections: Secondary | ICD-10-CM | POA: Diagnosis not present

## 2019-10-25 DIAGNOSIS — Z0000 Encounter for general adult medical examination without abnormal findings: Secondary | ICD-10-CM | POA: Diagnosis not present

## 2019-10-25 DIAGNOSIS — D696 Thrombocytopenia, unspecified: Secondary | ICD-10-CM | POA: Diagnosis not present

## 2019-10-25 DIAGNOSIS — D61818 Other pancytopenia: Secondary | ICD-10-CM | POA: Diagnosis not present

## 2019-10-25 DIAGNOSIS — E1165 Type 2 diabetes mellitus with hyperglycemia: Secondary | ICD-10-CM | POA: Diagnosis not present

## 2019-10-25 DIAGNOSIS — D649 Anemia, unspecified: Secondary | ICD-10-CM | POA: Diagnosis not present

## 2019-10-25 DIAGNOSIS — R102 Pelvic and perineal pain: Secondary | ICD-10-CM | POA: Diagnosis not present

## 2019-10-31 DIAGNOSIS — R1310 Dysphagia, unspecified: Secondary | ICD-10-CM | POA: Diagnosis not present

## 2019-10-31 DIAGNOSIS — I10 Essential (primary) hypertension: Secondary | ICD-10-CM | POA: Diagnosis not present

## 2019-10-31 DIAGNOSIS — Z87891 Personal history of nicotine dependence: Secondary | ICD-10-CM | POA: Diagnosis not present

## 2019-10-31 DIAGNOSIS — G5793 Unspecified mononeuropathy of bilateral lower limbs: Secondary | ICD-10-CM | POA: Diagnosis not present

## 2019-10-31 DIAGNOSIS — E119 Type 2 diabetes mellitus without complications: Secondary | ICD-10-CM | POA: Diagnosis not present

## 2019-10-31 DIAGNOSIS — K589 Irritable bowel syndrome without diarrhea: Secondary | ICD-10-CM | POA: Diagnosis not present

## 2019-10-31 DIAGNOSIS — D61818 Other pancytopenia: Secondary | ICD-10-CM | POA: Diagnosis not present

## 2019-10-31 DIAGNOSIS — F411 Generalized anxiety disorder: Secondary | ICD-10-CM | POA: Diagnosis not present

## 2019-11-06 ENCOUNTER — Telehealth: Payer: Self-pay | Admitting: *Deleted

## 2019-11-06 DIAGNOSIS — H6123 Impacted cerumen, bilateral: Secondary | ICD-10-CM | POA: Diagnosis not present

## 2019-11-06 DIAGNOSIS — H60333 Swimmer's ear, bilateral: Secondary | ICD-10-CM | POA: Diagnosis not present

## 2019-11-06 NOTE — Telephone Encounter (Signed)
Per Dr. Rogue Bussing- schedule patient for lab/md/possible venofer next week. Move apt up- next available.

## 2019-11-06 NOTE — Telephone Encounter (Signed)
-----   Message from Clancy Gourd sent at 11/06/2019 10:10 AM EDT ----- Regarding: Appt Request Hello,  I just uploaded a [place] into this pt's chart in Epic under the Media tab. It shows that pt is an established pt of Dr. Aletha Halim, last seen on 08/12/19. Can you call pt to set up an appt? Thanks.

## 2019-11-06 NOTE — Telephone Encounter (Signed)
Dr. Tenna Delaine referring patient back for pancytopenia and requesting a sooner apt than July 9'th. Labs checked at [facility] on 10/25/19  plt count 117; hgb 11.3; wbc 3.7; ferritin 345  Please advise. Next apt is on 7/9 for lab/md/venofer.

## 2019-11-07 DIAGNOSIS — D485 Neoplasm of uncertain behavior of skin: Secondary | ICD-10-CM | POA: Diagnosis not present

## 2019-11-07 DIAGNOSIS — D0439 Carcinoma in situ of skin of other parts of face: Secondary | ICD-10-CM | POA: Diagnosis not present

## 2019-11-07 DIAGNOSIS — D2262 Melanocytic nevi of left upper limb, including shoulder: Secondary | ICD-10-CM | POA: Diagnosis not present

## 2019-11-07 DIAGNOSIS — D2261 Melanocytic nevi of right upper limb, including shoulder: Secondary | ICD-10-CM | POA: Diagnosis not present

## 2019-11-07 DIAGNOSIS — D0359 Melanoma in situ of other part of trunk: Secondary | ICD-10-CM | POA: Diagnosis not present

## 2019-11-07 DIAGNOSIS — D225 Melanocytic nevi of trunk: Secondary | ICD-10-CM | POA: Diagnosis not present

## 2019-11-07 DIAGNOSIS — L570 Actinic keratosis: Secondary | ICD-10-CM | POA: Diagnosis not present

## 2019-11-07 DIAGNOSIS — D2271 Melanocytic nevi of right lower limb, including hip: Secondary | ICD-10-CM | POA: Diagnosis not present

## 2019-11-07 DIAGNOSIS — X32XXXA Exposure to sunlight, initial encounter: Secondary | ICD-10-CM | POA: Diagnosis not present

## 2019-11-07 DIAGNOSIS — L821 Other seborrheic keratosis: Secondary | ICD-10-CM | POA: Diagnosis not present

## 2019-11-07 DIAGNOSIS — D2272 Melanocytic nevi of left lower limb, including hip: Secondary | ICD-10-CM | POA: Diagnosis not present

## 2019-11-12 DIAGNOSIS — Z961 Presence of intraocular lens: Secondary | ICD-10-CM | POA: Diagnosis not present

## 2019-11-12 DIAGNOSIS — H5203 Hypermetropia, bilateral: Secondary | ICD-10-CM | POA: Diagnosis not present

## 2019-11-12 DIAGNOSIS — Z0100 Encounter for examination of eyes and vision without abnormal findings: Secondary | ICD-10-CM | POA: Diagnosis not present

## 2019-11-12 DIAGNOSIS — H40013 Open angle with borderline findings, low risk, bilateral: Secondary | ICD-10-CM | POA: Diagnosis not present

## 2019-11-12 DIAGNOSIS — E103293 Type 1 diabetes mellitus with mild nonproliferative diabetic retinopathy without macular edema, bilateral: Secondary | ICD-10-CM | POA: Diagnosis not present

## 2019-11-12 DIAGNOSIS — H52223 Regular astigmatism, bilateral: Secondary | ICD-10-CM | POA: Diagnosis not present

## 2019-11-12 DIAGNOSIS — H524 Presbyopia: Secondary | ICD-10-CM | POA: Diagnosis not present

## 2019-11-13 DIAGNOSIS — R933 Abnormal findings on diagnostic imaging of other parts of digestive tract: Secondary | ICD-10-CM | POA: Diagnosis not present

## 2019-11-13 DIAGNOSIS — R1031 Right lower quadrant pain: Secondary | ICD-10-CM | POA: Diagnosis not present

## 2019-11-13 DIAGNOSIS — Z86010 Personal history of colonic polyps: Secondary | ICD-10-CM | POA: Diagnosis not present

## 2019-11-13 DIAGNOSIS — R1311 Dysphagia, oral phase: Secondary | ICD-10-CM | POA: Diagnosis not present

## 2019-11-14 DIAGNOSIS — R519 Headache, unspecified: Secondary | ICD-10-CM | POA: Diagnosis not present

## 2019-11-14 DIAGNOSIS — H9201 Otalgia, right ear: Secondary | ICD-10-CM | POA: Diagnosis not present

## 2019-11-15 ENCOUNTER — Other Ambulatory Visit: Payer: Self-pay | Admitting: *Deleted

## 2019-11-15 ENCOUNTER — Other Ambulatory Visit: Payer: Self-pay | Admitting: Unknown Physician Specialty

## 2019-11-15 DIAGNOSIS — D509 Iron deficiency anemia, unspecified: Secondary | ICD-10-CM

## 2019-11-15 DIAGNOSIS — R519 Headache, unspecified: Secondary | ICD-10-CM

## 2019-11-18 ENCOUNTER — Other Ambulatory Visit: Payer: Self-pay

## 2019-11-18 ENCOUNTER — Inpatient Hospital Stay: Payer: Medicare HMO

## 2019-11-18 ENCOUNTER — Inpatient Hospital Stay: Payer: Medicare HMO | Attending: Internal Medicine

## 2019-11-18 ENCOUNTER — Inpatient Hospital Stay (HOSPITAL_BASED_OUTPATIENT_CLINIC_OR_DEPARTMENT_OTHER): Payer: Medicare HMO | Admitting: Internal Medicine

## 2019-11-18 ENCOUNTER — Encounter: Payer: Self-pay | Admitting: Internal Medicine

## 2019-11-18 VITALS — BP 184/80 | HR 72 | Temp 98.5°F | Resp 20

## 2019-11-18 VITALS — BP 184/75 | HR 67 | Resp 18

## 2019-11-18 DIAGNOSIS — R0600 Dyspnea, unspecified: Secondary | ICD-10-CM

## 2019-11-18 DIAGNOSIS — D696 Thrombocytopenia, unspecified: Secondary | ICD-10-CM

## 2019-11-18 DIAGNOSIS — D509 Iron deficiency anemia, unspecified: Secondary | ICD-10-CM | POA: Diagnosis not present

## 2019-11-18 DIAGNOSIS — E611 Iron deficiency: Secondary | ICD-10-CM | POA: Diagnosis not present

## 2019-11-18 DIAGNOSIS — D500 Iron deficiency anemia secondary to blood loss (chronic): Secondary | ICD-10-CM

## 2019-11-18 DIAGNOSIS — R0609 Other forms of dyspnea: Secondary | ICD-10-CM

## 2019-11-18 LAB — CBC WITH DIFFERENTIAL/PLATELET
Abs Immature Granulocytes: 0.03 10*3/uL (ref 0.00–0.07)
Basophils Absolute: 0 10*3/uL (ref 0.0–0.1)
Basophils Relative: 0 %
Eosinophils Absolute: 0.2 10*3/uL (ref 0.0–0.5)
Eosinophils Relative: 4 %
HCT: 34.2 % — ABNORMAL LOW (ref 36.0–46.0)
Hemoglobin: 11.8 g/dL — ABNORMAL LOW (ref 12.0–15.0)
Immature Granulocytes: 1 %
Lymphocytes Relative: 23 %
Lymphs Abs: 1.2 10*3/uL (ref 0.7–4.0)
MCH: 32 pg (ref 26.0–34.0)
MCHC: 34.5 g/dL (ref 30.0–36.0)
MCV: 92.7 fL (ref 80.0–100.0)
Monocytes Absolute: 0.4 10*3/uL (ref 0.1–1.0)
Monocytes Relative: 7 %
Neutro Abs: 3.5 10*3/uL (ref 1.7–7.7)
Neutrophils Relative %: 65 %
Platelets: 113 10*3/uL — ABNORMAL LOW (ref 150–400)
RBC: 3.69 MIL/uL — ABNORMAL LOW (ref 3.87–5.11)
RDW: 11.9 % (ref 11.5–15.5)
WBC: 5.3 10*3/uL (ref 4.0–10.5)
nRBC: 0 % (ref 0.0–0.2)

## 2019-11-18 LAB — IRON AND TIBC
Iron: 89 ug/dL (ref 28–170)
Saturation Ratios: 22 % (ref 10.4–31.8)
TIBC: 405 ug/dL (ref 250–450)
UIBC: 316 ug/dL

## 2019-11-18 LAB — BASIC METABOLIC PANEL
Anion gap: 10 (ref 5–15)
BUN: 27 mg/dL — ABNORMAL HIGH (ref 8–23)
CO2: 26 mmol/L (ref 22–32)
Calcium: 9.8 mg/dL (ref 8.9–10.3)
Chloride: 105 mmol/L (ref 98–111)
Creatinine, Ser: 0.82 mg/dL (ref 0.44–1.00)
GFR calc Af Amer: >60 mL/min (ref >60)
GFR calc non Af Amer: >60 mL/min (ref >60)
Glucose, Bld: 150 mg/dL — ABNORMAL HIGH (ref 70–99)
Potassium: 4.2 mmol/L (ref 3.5–5.1)
Sodium: 141 mmol/L (ref 135–145)

## 2019-11-18 LAB — FERRITIN: Ferritin: 403 ng/mL — ABNORMAL HIGH (ref 11–307)

## 2019-11-18 MED ORDER — IRON SUCROSE 20 MG/ML IV SOLN
200.0000 mg | Freq: Once | INTRAVENOUS | Status: AC
  Administered 2019-11-18: 200 mg via INTRAVENOUS
  Filled 2019-11-18: qty 10

## 2019-11-18 NOTE — Assessment & Plan Note (Addendum)
#  Chronic mild anemia-hemoglobin 10.9; dec 2020-Iron sat-14%; s/p Venofer improved; Hb 11.8.   # ITP/ Thrombocytopenia-platelet count-113 on surveillance.   # fatigue- dyspnea on exertion- check CXR: remote smoking.    # DISPOSITION: # Venofer IV today # follow up in 3 months/cbc/bmp-possible Venofer-Dr.B

## 2019-11-18 NOTE — Progress Notes (Signed)
Pt given venofer. Tolerated well. BP 184/75, P 67. MD previously notified of elevated BP, RN given ok to proceed. Pt instructed to monitor BP at home and go to ED if experiences symptoms of elevated BP. Pt verbalizes understanding. Pt states she will call her PCP in the morning. Discharged in NAD.

## 2019-11-18 NOTE — Progress Notes (Signed)
[facility] CONSULT NOTE  Patient Care Team: Tracie Harrier, MD as PCP - General (Internal Medicine) Tracie Harrier, MD as Physician Assistant (Internal Medicine) Lequita Asal, MD as Consulting Physician (Hematology and Oncology)  CHIEF COMPLAINTS/PURPOSE OF CONSULTATION: Iron deficiency anemia/thrombocytopenia  #Chronic [2015] mild anemia hemoglobin 11.1-question iron deficiency versus others.-Dr. Elliott/Dr. Mike Gip; s/p Venofer.   #Mild thrombocytopenia platelets 120s  #Fatigue/ PN/DM-   Oncology History   No history exists.    HISTORY OF PRESENTING ILLNESS:  Kim Ballard 85 y.o.  female longstanding history of iron deficient anemia/thrombocytopenia is here for follow-up.  Patient complains of continued shortness of breath with exertion.  Had recent UTI.  She is quite emotional.  Complains of severe fatigue.  No blood in stools or black stools.   Review of Systems  Constitutional: Positive for malaise/fatigue. Negative for chills, diaphoresis, fever and weight loss.  HENT: Negative for nosebleeds and sore throat.   Eyes: Negative for double vision.  Respiratory: Negative for cough, hemoptysis, sputum production, shortness of breath and wheezing.   Cardiovascular: Negative for chest pain, palpitations, orthopnea and leg swelling.  Gastrointestinal: Positive for abdominal pain. Negative for blood in stool, constipation, diarrhea, heartburn, melena, nausea and vomiting.  Genitourinary: Negative for dysuria, frequency and urgency.  Musculoskeletal: Negative for back pain and joint pain.  Skin: Negative.  Negative for itching and rash.  Neurological: Positive for tingling. Negative for dizziness, focal weakness, weakness and headaches.  Endo/Heme/Allergies: Does not bruise/bleed easily.  Psychiatric/Behavioral: Negative for depression. The patient is not nervous/anxious and does not have insomnia.      MEDICAL HISTORY:  Past Medical History:   Diagnosis Date  . Allergic state   . Anemia   . Arthritis   . Cancer ([place])    skin cancers  . Chronic cystitis with hematuria   . Depression   . Diabetes mellitus without complication ([place])   . Fibrocystic disease of both breasts   . Fibromyalgia   . GERD (gastroesophageal reflux disease)   . Hypertension   . IBS (irritable bowel syndrome)   . Nausea   . Skin cancer   . Wears dentures    full upper and lower    SURGICAL HISTORY: Past Surgical History:  Procedure Laterality Date  . ABDOMINAL HYSTERECTOMY    . APPENDECTOMY    . BREAST BIOPSY Right 2001   surgical bx   . CATARACT EXTRACTION W/PHACO Right 03/08/2017   Procedure: CATARACT EXTRACTION PHACO AND INTRAOCULAR LENS PLACEMENT ([place]) RIGHT DIABETIC TORIC;  Surgeon: Leandrew Koyanagi, MD;  Location: [facility];  Service: Ophthalmology;  Laterality: Right;  Diabetic - oral meds  . CATARACT EXTRACTION W/PHACO Left 04/17/2017   Procedure: CATARACT EXTRACTION PHACO AND INTRAOCULAR LENS PLACEMENT ([place]) LEFT DIABETIC;  Surgeon: Leandrew Koyanagi, MD;  Location: [facility];  Service: Ophthalmology;  Laterality: Left;  diabetic-oral meds  . CHOLECYSTECTOMY    . COLONOSCOPY    . COLONOSCOPY WITH PROPOFOL N/A 08/27/2015   Procedure: COLONOSCOPY WITH PROPOFOL;  Surgeon: Manya Silvas, MD;  Location: [facility] ENDOSCOPY;  Service: Endoscopy;  Laterality: N/A;  . COLONOSCOPY WITH PROPOFOL N/A 05/02/2016   Procedure: COLONOSCOPY WITH PROPOFOL;  Surgeon: Manya Silvas, MD;  Location: [facility] ENDOSCOPY;  Service: Endoscopy;  Laterality: N/A;  . ESOPHAGOGASTRODUODENOSCOPY    . ESOPHAGOGASTRODUODENOSCOPY (EGD) WITH PROPOFOL N/A 08/27/2015   Procedure: ESOPHAGOGASTRODUODENOSCOPY (EGD) WITH PROPOFOL;  Surgeon: Manya Silvas, MD;  Location: [facility] ENDOSCOPY;  Service: Endoscopy;  Laterality: N/A;  . HERNIA REPAIR  SOCIAL HISTORY: Social History   Socioeconomic History  . Marital status: Married    Spouse name:  Not on file  . Number of children: Not on file  . Years of education: Not on file  . Highest education level: Not on file  Occupational History  . Not on file  Tobacco Use  . Smoking status: Former Smoker    Quit date: 1960    Years since quitting: 61.5  . Smokeless tobacco: Never Used  Vaping Use  . Vaping Use: Never used  Substance and Sexual Activity  . Alcohol use: No  . Drug use: No  . Sexual activity: Not Currently  Other Topics Concern  . Not on file  Social History Narrative  . Not on file   Social Determinants of Health   Financial Resource Strain:   . Difficulty of Paying Living Expenses:   Food Insecurity:   . Worried About Charity fundraiser in the Last Year:   . Arboriculturist in the Last Year:   Transportation Needs:   . Film/video editor (Medical):   Marland Kitchen Lack of Transportation (Non-Medical):   Physical Activity:   . Days of Exercise per Week:   . Minutes of Exercise per Session:   Stress:   . Feeling of Stress :   Social Connections:   . Frequency of Communication with Friends and Family:   . Frequency of Social Gatherings with Friends and Family:   . Attends Religious Services:   . Active Member of Clubs or Organizations:   . Attends Archivist Meetings:   Marland Kitchen Marital Status:   Intimate Partner Violence:   . Fear of Current or Ex-Partner:   . Emotionally Abused:   Marland Kitchen Physically Abused:   . Sexually Abused:     FAMILY HISTORY: Family History  Problem Relation Age of Onset  . Hypertension Mother   . Ovarian cancer Paternal Grandmother   . COPD Father   . Hyperlipidemia Father   . Kidney disease Father   . Diabetes Maternal Grandmother   . Breast cancer Neg Hx     ALLERGIES:  is allergic to biaxin [clarithromycin], brompheniramine maleate, ceclor [cefaclor], ciprofloxacin, clinoril [sulindac], codeine sulfate, diovan [valsartan], doxycycline, erythromycin, esomeprazole, fluoxetine, guaifenesin & derivatives, levaquin  [levofloxacin in d5w], lisinopril, lodine [etodolac], macrodantin [nitrofurantoin macrocrystal], medrol [methylprednisolone], mobic [meloxicam], motrin [ibuprofen], nexium [esomeprazole magnesium], nitrofurantoin, norvasc [amlodipine besylate], omnicef [cefdinir], penicillins, prozac [fluoxetine hcl], pseudoephedrine, reglan [metoclopramide], seldane [terfenadine], sulfa antibiotics, toprol xl [metoprolol tartrate], triamterene, tussionex pennkinetic er [employer] polst-cpm polst er], and vibramycin [doxycycline calcium].  MEDICATIONS:  Current Outpatient Medications  Medication Sig Dispense Refill  . Ascorbic Acid (VITAMIN C) 1000 MG tablet Take 1,000 mg by mouth 2 times daily at 12 noon and 4 pm.    . cholecalciferol (VITAMIN D3) 25 MCG (1000 UT) tablet Take 1,000 Units by mouth 2 (two) times daily.    . clidinium-chlordiazePOXIDE (LIBRAX) 5-2.5 MG capsule Take 1 capsule by mouth.    . Cranberry 405 MG CAPS Take 2 capsules by mouth 2 (two) times daily.     Marland Kitchen FERREX 150 150 MG capsule     . fluticasone (FLONASE) 50 MCG/ACT nasal spray Place into both nostrils daily.    Marland Kitchen gabapentin (NEURONTIN) 100 MG capsule Take 1 capsule by mouth daily.    Marland Kitchen gemfibrozil (LOPID) 600 MG tablet Take 600 mg by mouth 2 (two) times daily before a meal.    . glipiZIDE (GLUCOTROL XL) 2.5 MG 24  hr tablet Take 2.5 mg by mouth daily with breakfast.    . hydrochlorothiazide (HYDRODIURIL) 25 MG tablet Take 25 mg by mouth daily.    . Lactobacillus Rhamnosus, GG, ([employer] PROBIOTIC, LACTOBACILLUS, PO) Take by mouth.    . metFORMIN (GLUCOPHAGE) 500 MG tablet Take 1,000 mg by mouth 2 (two) times daily with a meal.     . Multiple Vitamins-Minerals (MULTIVITAMIN WITH MINERALS) tablet Take 1 tablet by mouth daily.    . OMEGA-3 FATTY ACIDS PO Take by mouth.    . pantoprazole (PROTONIX) 40 MG tablet Take 40 mg by mouth daily.    . vitamin B-12 (CYANOCOBALAMIN) 1000 MCG tablet Take 1,000 mcg by mouth daily.    . vitamin E 100 UNIT  capsule Take by mouth daily.    Marland Kitchen estradiol (ESTRACE) 0.1 MG/GM vaginal cream  (Patient not taking: Reported on 11/18/2019)    . glipiZIDE (GLUCOTROL XL) 5 MG 24 hr tablet Take 1 tablet by mouth daily.     No current facility-administered medications for this visit.   Facility-Administered Medications Ordered in Other Visits  Medication Dose Route Frequency Provider Last Rate Last Admin  . iron sucrose (VENOFER) injection 200 mg  200 mg Intravenous Once Lequita Asal, MD          .  PHYSICAL EXAMINATION: ECOG PERFORMANCE STATUS: 0 - Asymptomatic  Vitals:   11/18/19 1344 11/18/19 1345  BP: (!) 187/84 (!) 184/80  Pulse: 72   Resp: 20   Temp: 98.5 F (36.9 C)    There were no vitals filed for this visit.  Physical Exam HENT:     Head: Normocephalic and atraumatic.     Mouth/Throat:     Pharynx: No oropharyngeal exudate.  Eyes:     Pupils: Pupils are equal, round, and reactive to light.  Cardiovascular:     Rate and Rhythm: Normal rate and regular rhythm.  Pulmonary:     Effort: Pulmonary effort is normal. No respiratory distress.     Breath sounds: Normal breath sounds. No wheezing.  Abdominal:     General: Bowel sounds are normal. There is no distension.     Palpations: Abdomen is soft. There is no mass.     Tenderness: There is no abdominal tenderness. There is no guarding or rebound.  Musculoskeletal:        General: No tenderness. Normal range of motion.     Cervical back: Normal range of motion and neck supple.  Skin:    General: Skin is warm.  Neurological:     Mental Status: She is alert and oriented to person, place, and time.  Psychiatric:        Mood and Affect: Affect normal.    LABORATORY DATA:  I have reviewed the data as listed Lab Results  Component Value Date   WBC 5.3 11/18/2019   HGB 11.8 (L) 11/18/2019   HCT 34.2 (L) 11/18/2019   MCV 92.7 11/18/2019   PLT 113 (L) 11/18/2019   Recent Labs    05/22/19 1411 05/22/19 1411  06/17/19 1002 08/12/19 1301 11/18/19 1303  NA 142   < > 138 138 141  K 4.3   < > 4.4 4.3 4.2  CL 106   < > 104 101 105  CO2 26   < > 23 26 26   GLUCOSE 184*   < > 243* 185* 150*  BUN 29*   < > 26* 38* 27*  CREATININE 1.21*   < > 0.71 1.11* 0.82  CALCIUM 9.5   < > 9.8 10.0 9.8  GFRNONAA 42*   < > >60 46* >60  GFRAA 48*   < > >60 54* >60  PROT 6.9  --   --   --   --   ALBUMIN 3.9  --   --   --   --   AST 8*  --   --   --   --   ALT 13  --   --   --   --   ALKPHOS 71  --   --   --   --   BILITOT 0.4  --   --   --   --    < > = values in this interval not displayed.    RADIOGRAPHIC STUDIES: I have personally reviewed the radiological images as listed and agreed with the findings in the report. No results found.  ASSESSMENT & PLAN:   Iron deficiency #Chronic mild anemia-hemoglobin 10.9; dec 2020-Iron sat-14%; s/p Venofer improved; Hb 11.8.   # ITP/ Thrombocytopenia-platelet count-113 on surveillance.   # fatigue- dyspnea on exertion- check CXR: remote smoking.    # DISPOSITION: # Venofer IV today # follow up in 3 months/cbc/bmp-possible Venofer-Dr.B All questions were answered. The patient knows to call the clinic with any problems, questions or concerns.    Cammie Sickle, MD 11/25/2019 5:09 PM

## 2019-11-18 NOTE — Progress Notes (Signed)
Blood pressure was elevated during MD visit today, see vital sign flow sheet. Blood pressure at this time, 190/91. MD, Dr. Rogue Bussing, notified and aware. Per MD order: Faythe Ghee to proceed with Venofer infusion at this time.

## 2019-11-19 DIAGNOSIS — F411 Generalized anxiety disorder: Secondary | ICD-10-CM | POA: Diagnosis not present

## 2019-11-19 DIAGNOSIS — J984 Other disorders of lung: Secondary | ICD-10-CM | POA: Diagnosis not present

## 2019-11-19 DIAGNOSIS — Z13820 Encounter for screening for osteoporosis: Secondary | ICD-10-CM | POA: Diagnosis not present

## 2019-11-19 DIAGNOSIS — R519 Headache, unspecified: Secondary | ICD-10-CM | POA: Diagnosis not present

## 2019-11-19 DIAGNOSIS — E119 Type 2 diabetes mellitus without complications: Secondary | ICD-10-CM | POA: Diagnosis not present

## 2019-11-19 DIAGNOSIS — K449 Diaphragmatic hernia without obstruction or gangrene: Secondary | ICD-10-CM | POA: Diagnosis not present

## 2019-11-19 DIAGNOSIS — E1165 Type 2 diabetes mellitus with hyperglycemia: Secondary | ICD-10-CM | POA: Diagnosis not present

## 2019-11-19 DIAGNOSIS — D61818 Other pancytopenia: Secondary | ICD-10-CM | POA: Diagnosis not present

## 2019-11-19 DIAGNOSIS — I10 Essential (primary) hypertension: Secondary | ICD-10-CM | POA: Diagnosis not present

## 2019-11-19 DIAGNOSIS — F331 Major depressive disorder, recurrent, moderate: Secondary | ICD-10-CM | POA: Diagnosis not present

## 2019-11-19 DIAGNOSIS — D509 Iron deficiency anemia, unspecified: Secondary | ICD-10-CM | POA: Diagnosis not present

## 2019-11-19 DIAGNOSIS — D649 Anemia, unspecified: Secondary | ICD-10-CM | POA: Diagnosis not present

## 2019-11-19 DIAGNOSIS — D696 Thrombocytopenia, unspecified: Secondary | ICD-10-CM | POA: Diagnosis not present

## 2019-11-20 ENCOUNTER — Other Ambulatory Visit: Payer: Medicare HMO

## 2019-11-20 ENCOUNTER — Ambulatory Visit: Payer: Medicare HMO | Admitting: Internal Medicine

## 2019-11-20 ENCOUNTER — Ambulatory Visit: Payer: Medicare HMO

## 2019-11-20 DIAGNOSIS — M8588 Other specified disorders of bone density and structure, other site: Secondary | ICD-10-CM | POA: Diagnosis not present

## 2019-11-28 ENCOUNTER — Other Ambulatory Visit: Payer: Self-pay

## 2019-11-28 ENCOUNTER — Ambulatory Visit
Admission: RE | Admit: 2019-11-28 | Discharge: 2019-11-28 | Disposition: A | Discharge status: Home / Self Care | Payer: Medicare HMO | Source: Ambulatory Visit | Attending: Unknown Physician Specialty | Admitting: Unknown Physician Specialty

## 2019-11-28 DIAGNOSIS — R519 Headache, unspecified: Secondary | ICD-10-CM | POA: Diagnosis not present

## 2019-11-28 MED ORDER — GADOBUTROL 1 MMOL/ML IV SOLN
6.0000 mL | Freq: Once | INTRAVENOUS | Status: AC | PRN
  Administered 2019-11-28: 6 mL via INTRAVENOUS

## 2019-12-04 DIAGNOSIS — D0359 Melanoma in situ of other part of trunk: Secondary | ICD-10-CM | POA: Diagnosis not present

## 2019-12-04 DIAGNOSIS — D225 Melanocytic nevi of trunk: Secondary | ICD-10-CM | POA: Diagnosis not present

## 2019-12-11 DIAGNOSIS — H1045 Other chronic allergic conjunctivitis: Secondary | ICD-10-CM | POA: Diagnosis not present

## 2019-12-11 DIAGNOSIS — Z961 Presence of intraocular lens: Secondary | ICD-10-CM | POA: Diagnosis not present

## 2019-12-11 DIAGNOSIS — H52223 Regular astigmatism, bilateral: Secondary | ICD-10-CM | POA: Diagnosis not present

## 2019-12-11 DIAGNOSIS — H5203 Hypermetropia, bilateral: Secondary | ICD-10-CM | POA: Diagnosis not present

## 2019-12-11 DIAGNOSIS — H524 Presbyopia: Secondary | ICD-10-CM | POA: Diagnosis not present

## 2019-12-11 DIAGNOSIS — E103293 Type 1 diabetes mellitus with mild nonproliferative diabetic retinopathy without macular edema, bilateral: Secondary | ICD-10-CM | POA: Diagnosis not present

## 2019-12-11 DIAGNOSIS — H40013 Open angle with borderline findings, low risk, bilateral: Secondary | ICD-10-CM | POA: Diagnosis not present

## 2019-12-13 ENCOUNTER — Ambulatory Visit: Payer: Medicare HMO | Admitting: Internal Medicine

## 2019-12-13 ENCOUNTER — Other Ambulatory Visit: Payer: Medicare HMO

## 2019-12-13 ENCOUNTER — Ambulatory Visit: Payer: Medicare HMO

## 2019-12-16 DIAGNOSIS — H1045 Other chronic allergic conjunctivitis: Secondary | ICD-10-CM | POA: Diagnosis not present

## 2019-12-16 DIAGNOSIS — H524 Presbyopia: Secondary | ICD-10-CM | POA: Diagnosis not present

## 2019-12-16 DIAGNOSIS — H5203 Hypermetropia, bilateral: Secondary | ICD-10-CM | POA: Diagnosis not present

## 2019-12-16 DIAGNOSIS — H52223 Regular astigmatism, bilateral: Secondary | ICD-10-CM | POA: Diagnosis not present

## 2019-12-16 DIAGNOSIS — E103293 Type 1 diabetes mellitus with mild nonproliferative diabetic retinopathy without macular edema, bilateral: Secondary | ICD-10-CM | POA: Diagnosis not present

## 2019-12-16 DIAGNOSIS — H40013 Open angle with borderline findings, low risk, bilateral: Secondary | ICD-10-CM | POA: Diagnosis not present

## 2019-12-16 DIAGNOSIS — Z961 Presence of intraocular lens: Secondary | ICD-10-CM | POA: Diagnosis not present

## 2019-12-16 DIAGNOSIS — H04123 Dry eye syndrome of bilateral lacrimal glands: Secondary | ICD-10-CM | POA: Diagnosis not present

## 2019-12-23 DIAGNOSIS — H01003 Unspecified blepharitis right eye, unspecified eyelid: Secondary | ICD-10-CM | POA: Diagnosis not present

## 2020-01-14 DIAGNOSIS — M5481 Occipital neuralgia: Secondary | ICD-10-CM | POA: Diagnosis not present

## 2020-01-14 DIAGNOSIS — H4901 Third [oculomotor] nerve palsy, right eye: Secondary | ICD-10-CM | POA: Diagnosis not present

## 2020-01-14 DIAGNOSIS — H04203 Unspecified epiphora, bilateral lacrimal glands: Secondary | ICD-10-CM | POA: Diagnosis not present

## 2020-01-14 DIAGNOSIS — H53149 Visual discomfort, unspecified: Secondary | ICD-10-CM | POA: Diagnosis not present

## 2020-01-17 DIAGNOSIS — E113393 Type 2 diabetes mellitus with moderate nonproliferative diabetic retinopathy without macular edema, bilateral: Secondary | ICD-10-CM | POA: Diagnosis not present

## 2020-01-23 DIAGNOSIS — G5793 Unspecified mononeuropathy of bilateral lower limbs: Secondary | ICD-10-CM | POA: Diagnosis not present

## 2020-01-23 DIAGNOSIS — I10 Essential (primary) hypertension: Secondary | ICD-10-CM | POA: Diagnosis not present

## 2020-01-23 DIAGNOSIS — K589 Irritable bowel syndrome without diarrhea: Secondary | ICD-10-CM | POA: Diagnosis not present

## 2020-01-23 DIAGNOSIS — E1165 Type 2 diabetes mellitus with hyperglycemia: Secondary | ICD-10-CM | POA: Diagnosis not present

## 2020-01-23 DIAGNOSIS — D61818 Other pancytopenia: Secondary | ICD-10-CM | POA: Diagnosis not present

## 2020-01-27 DIAGNOSIS — H2000 Unspecified acute and subacute iridocyclitis: Secondary | ICD-10-CM | POA: Diagnosis not present

## 2020-01-30 DIAGNOSIS — G5793 Unspecified mononeuropathy of bilateral lower limbs: Secondary | ICD-10-CM | POA: Diagnosis not present

## 2020-01-30 DIAGNOSIS — E119 Type 2 diabetes mellitus without complications: Secondary | ICD-10-CM | POA: Diagnosis not present

## 2020-01-30 DIAGNOSIS — H5712 Ocular pain, left eye: Secondary | ICD-10-CM | POA: Diagnosis not present

## 2020-01-30 DIAGNOSIS — M8580 Other specified disorders of bone density and structure, unspecified site: Secondary | ICD-10-CM | POA: Insufficient documentation

## 2020-01-30 DIAGNOSIS — I10 Essential (primary) hypertension: Secondary | ICD-10-CM | POA: Diagnosis not present

## 2020-01-30 DIAGNOSIS — E875 Hyperkalemia: Secondary | ICD-10-CM | POA: Diagnosis not present

## 2020-01-30 DIAGNOSIS — C4359 Malignant melanoma of other part of trunk: Secondary | ICD-10-CM | POA: Insufficient documentation

## 2020-01-30 DIAGNOSIS — Z79899 Other long term (current) drug therapy: Secondary | ICD-10-CM | POA: Diagnosis not present

## 2020-02-03 DIAGNOSIS — E875 Hyperkalemia: Secondary | ICD-10-CM | POA: Diagnosis not present

## 2020-02-03 DIAGNOSIS — H5789 Other specified disorders of eye and adnexa: Secondary | ICD-10-CM | POA: Diagnosis not present

## 2020-02-03 DIAGNOSIS — D696 Thrombocytopenia, unspecified: Secondary | ICD-10-CM | POA: Diagnosis not present

## 2020-02-03 DIAGNOSIS — R9389 Abnormal findings on diagnostic imaging of other specified body structures: Secondary | ICD-10-CM | POA: Diagnosis not present

## 2020-02-03 DIAGNOSIS — R918 Other nonspecific abnormal finding of lung field: Secondary | ICD-10-CM | POA: Diagnosis not present

## 2020-02-03 DIAGNOSIS — H538 Other visual disturbances: Secondary | ICD-10-CM | POA: Diagnosis not present

## 2020-02-03 DIAGNOSIS — G4710 Hypersomnia, unspecified: Secondary | ICD-10-CM | POA: Diagnosis not present

## 2020-02-03 DIAGNOSIS — H2000 Unspecified acute and subacute iridocyclitis: Secondary | ICD-10-CM | POA: Diagnosis not present

## 2020-02-03 DIAGNOSIS — G5793 Unspecified mononeuropathy of bilateral lower limbs: Secondary | ICD-10-CM | POA: Diagnosis not present

## 2020-02-03 DIAGNOSIS — M47814 Spondylosis without myelopathy or radiculopathy, thoracic region: Secondary | ICD-10-CM | POA: Diagnosis not present

## 2020-02-12 DIAGNOSIS — H2000 Unspecified acute and subacute iridocyclitis: Secondary | ICD-10-CM | POA: Diagnosis not present

## 2020-02-13 ENCOUNTER — Encounter: Payer: Self-pay | Admitting: Internal Medicine

## 2020-02-14 ENCOUNTER — Inpatient Hospital Stay: Payer: Medicare HMO | Attending: Internal Medicine

## 2020-02-14 ENCOUNTER — Other Ambulatory Visit: Payer: Self-pay

## 2020-02-14 ENCOUNTER — Inpatient Hospital Stay: Payer: Medicare HMO

## 2020-02-14 ENCOUNTER — Inpatient Hospital Stay: Payer: Medicare HMO | Admitting: Internal Medicine

## 2020-02-14 VITALS — BP 163/77 | HR 69 | Temp 96.7°F | Resp 16 | Ht 67.0 in | Wt 146.0 lb

## 2020-02-14 DIAGNOSIS — D696 Thrombocytopenia, unspecified: Secondary | ICD-10-CM | POA: Insufficient documentation

## 2020-02-14 DIAGNOSIS — D509 Iron deficiency anemia, unspecified: Secondary | ICD-10-CM | POA: Diagnosis not present

## 2020-02-14 DIAGNOSIS — E611 Iron deficiency: Secondary | ICD-10-CM

## 2020-02-14 LAB — CBC WITH DIFFERENTIAL/PLATELET
Abs Immature Granulocytes: 0.02 10*3/uL (ref 0.00–0.07)
Basophils Absolute: 0 10*3/uL (ref 0.0–0.1)
Basophils Relative: 1 %
Eosinophils Absolute: 0.1 10*3/uL (ref 0.0–0.5)
Eosinophils Relative: 4 %
HCT: 32.5 % — ABNORMAL LOW (ref 36.0–46.0)
Hemoglobin: 11.6 g/dL — ABNORMAL LOW (ref 12.0–15.0)
Immature Granulocytes: 1 %
Lymphocytes Relative: 24 %
Lymphs Abs: 1 10*3/uL (ref 0.7–4.0)
MCH: 32.9 pg (ref 26.0–34.0)
MCHC: 35.7 g/dL (ref 30.0–36.0)
MCV: 92.1 fL (ref 80.0–100.0)
Monocytes Absolute: 0.4 10*3/uL (ref 0.1–1.0)
Monocytes Relative: 9 %
Neutro Abs: 2.5 10*3/uL (ref 1.7–7.7)
Neutrophils Relative %: 61 %
Platelets: 120 10*3/uL — ABNORMAL LOW (ref 150–400)
RBC: 3.53 MIL/uL — ABNORMAL LOW (ref 3.87–5.11)
RDW: 12.9 % (ref 11.5–15.5)
WBC: 4 10*3/uL (ref 4.0–10.5)
nRBC: 0 % (ref 0.0–0.2)

## 2020-02-14 LAB — BASIC METABOLIC PANEL WITH GFR
Anion gap: 13 (ref 5–15)
BUN: 30 mg/dL — ABNORMAL HIGH (ref 8–23)
CO2: 21 mmol/L — ABNORMAL LOW (ref 22–32)
Calcium: 9.5 mg/dL (ref 8.9–10.3)
Chloride: 108 mmol/L (ref 98–111)
Creatinine, Ser: 0.84 mg/dL (ref 0.44–1.00)
GFR calc Af Amer: >60 mL/min
GFR calc non Af Amer: >60 mL/min
Glucose, Bld: 135 mg/dL — ABNORMAL HIGH (ref 70–99)
Potassium: 3.9 mmol/L (ref 3.5–5.1)
Sodium: 142 mmol/L (ref 135–145)

## 2020-02-14 NOTE — Progress Notes (Signed)
[facility] CONSULT NOTE  Patient Care Team: Tracie Harrier, MD as PCP - General (Internal Medicine) Tracie Harrier, MD as Physician Assistant (Internal Medicine) Lequita Asal, MD as Consulting Physician (Hematology and Oncology)  CHIEF COMPLAINTS/PURPOSE OF CONSULTATION: Iron deficiency anemia/thrombocytopenia  #Chronic [2015] mild anemia hemoglobin 11.1-question iron deficiency versus others.-Dr. Elliott/Dr. Mike Gip; s/p Venofer.   #Mild thrombocytopenia platelets 120s  #Fatigue/ PN/DM; MELANOMA of mid back [May 2021] s/p excision.   Oncology History   No history exists.    HISTORY OF PRESENTING ILLNESS:  Kim Ballard 85 y.o.  female longstanding history of iron deficient anemia/thrombocytopenia is here for follow-up.  Patient denies any new shortness of breath or cough.  Denies any blood in stools or black stool.  Continues to have moderate to severe fatigue.  Has had left eye issues currently improved.   Review of Systems  Constitutional: Positive for malaise/fatigue. Negative for chills, diaphoresis, fever and weight loss.  HENT: Negative for nosebleeds and sore throat.   Eyes: Negative for double vision.  Respiratory: Negative for cough, hemoptysis, sputum production, shortness of breath and wheezing.   Cardiovascular: Negative for chest pain, palpitations, orthopnea and leg swelling.  Gastrointestinal: Negative for blood in stool, constipation, diarrhea, heartburn, melena, nausea and vomiting.  Genitourinary: Negative for dysuria, frequency and urgency.  Musculoskeletal: Negative for back pain and joint pain.  Skin: Negative.  Negative for itching and rash.  Neurological: Positive for tingling. Negative for dizziness, focal weakness, weakness and headaches.  Endo/Heme/Allergies: Does not bruise/bleed easily.  Psychiatric/Behavioral: Negative for depression. The patient is not nervous/anxious and does not have insomnia.      MEDICAL  HISTORY:  Past Medical History:  Diagnosis Date  . Allergic state   . Anemia   . Arthritis   . Cancer ([place])    skin cancers  . Chronic cystitis with hematuria   . Depression   . Diabetes mellitus without complication ([place])   . Fibrocystic disease of both breasts   . Fibromyalgia   . GERD (gastroesophageal reflux disease)   . Hypertension   . IBS (irritable bowel syndrome)   . Nausea   . Skin cancer   . Wears dentures    full upper and lower    SURGICAL HISTORY: Past Surgical History:  Procedure Laterality Date  . ABDOMINAL HYSTERECTOMY    . APPENDECTOMY    . BREAST BIOPSY Right 2001   surgical bx   . CATARACT EXTRACTION W/PHACO Right 03/08/2017   Procedure: CATARACT EXTRACTION PHACO AND INTRAOCULAR LENS PLACEMENT ([place]) RIGHT DIABETIC TORIC;  Surgeon: Leandrew Koyanagi, MD;  Location: [facility];  Service: Ophthalmology;  Laterality: Right;  Diabetic - oral meds  . CATARACT EXTRACTION W/PHACO Left 04/17/2017   Procedure: CATARACT EXTRACTION PHACO AND INTRAOCULAR LENS PLACEMENT ([place]) LEFT DIABETIC;  Surgeon: Leandrew Koyanagi, MD;  Location: [facility];  Service: Ophthalmology;  Laterality: Left;  diabetic-oral meds  . CHOLECYSTECTOMY    . COLONOSCOPY    . COLONOSCOPY WITH PROPOFOL N/A 08/27/2015   Procedure: COLONOSCOPY WITH PROPOFOL;  Surgeon: Manya Silvas, MD;  Location: [facility] ENDOSCOPY;  Service: Endoscopy;  Laterality: N/A;  . COLONOSCOPY WITH PROPOFOL N/A 05/02/2016   Procedure: COLONOSCOPY WITH PROPOFOL;  Surgeon: Manya Silvas, MD;  Location: [facility] ENDOSCOPY;  Service: Endoscopy;  Laterality: N/A;  . ESOPHAGOGASTRODUODENOSCOPY    . ESOPHAGOGASTRODUODENOSCOPY (EGD) WITH PROPOFOL N/A 08/27/2015   Procedure: ESOPHAGOGASTRODUODENOSCOPY (EGD) WITH PROPOFOL;  Surgeon: Manya Silvas, MD;  Location: [facility] ENDOSCOPY;  Service: Endoscopy;  Laterality: N/A;  . HERNIA REPAIR      SOCIAL HISTORY: Social History   Socioeconomic History  . Marital  status: Married    Spouse name: Not on file  . Number of children: Not on file  . Years of education: Not on file  . Highest education level: Not on file  Occupational History  . Not on file  Tobacco Use  . Smoking status: Former Smoker    Quit date: 1960    Years since quitting: 61.7  . Smokeless tobacco: Never Used  Vaping Use  . Vaping Use: Never used  Substance and Sexual Activity  . Alcohol use: No  . Drug use: No  . Sexual activity: Not Currently  Other Topics Concern  . Not on file  Social History Narrative  . Not on file   Social Determinants of Health   Financial Resource Strain:   . Difficulty of Paying Living Expenses: Not on file  Food Insecurity:   . Worried About Charity fundraiser in the Last Year: Not on file  . Ran Out of Food in the Last Year: Not on file  Transportation Needs:   . Lack of Transportation (Medical): Not on file  . Lack of Transportation (Non-Medical): Not on file  Physical Activity:   . Days of Exercise per Week: Not on file  . Minutes of Exercise per Session: Not on file  Stress:   . Feeling of Stress : Not on file  Social Connections:   . Frequency of Communication with Friends and Family: Not on file  . Frequency of Social Gatherings with Friends and Family: Not on file  . Attends Religious Services: Not on file  . Active Member of Clubs or Organizations: Not on file  . Attends Archivist Meetings: Not on file  . Marital Status: Not on file  Intimate Partner Violence:   . Fear of Current or Ex-Partner: Not on file  . Emotionally Abused: Not on file  . Physically Abused: Not on file  . Sexually Abused: Not on file    FAMILY HISTORY: Family History  Problem Relation Age of Onset  . Hypertension Mother   . Ovarian cancer Paternal Grandmother   . COPD Father   . Hyperlipidemia Father   . Kidney disease Father   . Diabetes Maternal Grandmother   . Breast cancer Neg Hx     ALLERGIES:  is allergic to biaxin  [clarithromycin], brompheniramine maleate, ceclor [cefaclor], ciprofloxacin, clinoril [sulindac], codeine sulfate, diovan [valsartan], doxycycline, erythromycin, esomeprazole, fluoxetine, guaifenesin & derivatives, levaquin [levofloxacin in d5w], lisinopril, lodine [etodolac], macrodantin [nitrofurantoin macrocrystal], medrol [methylprednisolone], mobic [meloxicam], motrin [ibuprofen], nexium [esomeprazole magnesium], nitrofurantoin, norvasc [amlodipine besylate], omnicef [cefdinir], penicillins, prozac [fluoxetine hcl], pseudoephedrine, reglan [metoclopramide], seldane [terfenadine], sulfa antibiotics, toprol xl [metoprolol tartrate], triamterene, tussionex pennkinetic er [employer] polst-cpm polst er], and vibramycin [doxycycline calcium].  MEDICATIONS:  Current Outpatient Medications  Medication Sig Dispense Refill  . Ascorbic Acid (VITAMIN C) 1000 MG tablet Take 1,000 mg by mouth 2 times daily at 12 noon and 4 pm.    . cholecalciferol (VITAMIN D3) 25 MCG (1000 UT) tablet Take 1,000 Units by mouth 2 (two) times daily.    . clidinium-chlordiazePOXIDE (LIBRAX) 5-2.5 MG capsule Take 1 capsule by mouth.    . Cranberry 405 MG CAPS Take 2 capsules by mouth 2 (two) times daily.     Marland Kitchen FERREX 150 150 MG capsule     . fluticasone (FLONASE) 50 MCG/ACT nasal spray Place into both nostrils  daily.    . gabapentin (NEURONTIN) 100 MG capsule Take 1 capsule by mouth daily.    Marland Kitchen gemfibrozil (LOPID) 600 MG tablet Take 600 mg by mouth 2 (two) times daily before a meal.    . glipiZIDE (GLUCOTROL XL) 2.5 MG 24 hr tablet Take 2.5 mg by mouth daily with breakfast.    . glipiZIDE (GLUCOTROL XL) 5 MG 24 hr tablet Take 1 tablet by mouth daily.    . hydrochlorothiazide (HYDRODIURIL) 25 MG tablet Take 25 mg by mouth daily.    . Lactobacillus Rhamnosus, GG, ([employer] PROBIOTIC, LACTOBACILLUS, PO) Take by mouth.    . metFORMIN (GLUCOPHAGE) 500 MG tablet Take 1,000 mg by mouth 2 (two) times daily with a meal.     . Multiple  Vitamins-Minerals (MULTIVITAMIN WITH MINERALS) tablet Take 1 tablet by mouth daily.    . OMEGA-3 FATTY ACIDS PO Take by mouth.    . pantoprazole (PROTONIX) 40 MG tablet Take 40 mg by mouth daily.    . vitamin B-12 (CYANOCOBALAMIN) 1000 MCG tablet Take 1,000 mcg by mouth daily.    . vitamin E 100 UNIT capsule Take by mouth daily.    Marland Kitchen estradiol (ESTRACE) 0.1 MG/GM vaginal cream  (Patient not taking: Reported on 11/18/2019)     No current facility-administered medications for this visit.   Facility-Administered Medications Ordered in Other Visits  Medication Dose Route Frequency Provider Last Rate Last Admin  . iron sucrose (VENOFER) injection 200 mg  200 mg Intravenous Once Lequita Asal, MD          .  PHYSICAL EXAMINATION: ECOG PERFORMANCE STATUS: 0 - Asymptomatic  Vitals:   02/14/20 1300  BP: (!) 163/77  Pulse: 69  Resp: 16  Temp: (!) 96.7 F (35.9 C)  SpO2: 100%   Filed Weights   02/14/20 1300  Weight: 146 lb (66.2 kg)    Physical Exam HENT:     Head: Normocephalic and atraumatic.     Mouth/Throat:     Pharynx: No oropharyngeal exudate.  Eyes:     Pupils: Pupils are equal, round, and reactive to light.  Cardiovascular:     Rate and Rhythm: Normal rate and regular rhythm.  Pulmonary:     Effort: Pulmonary effort is normal. No respiratory distress.     Breath sounds: Normal breath sounds. No wheezing.  Abdominal:     General: Bowel sounds are normal. There is no distension.     Palpations: Abdomen is soft. There is no mass.     Tenderness: There is no abdominal tenderness. There is no guarding or rebound.  Musculoskeletal:        General: No tenderness. Normal range of motion.     Cervical back: Normal range of motion and neck supple.  Skin:    General: Skin is warm.  Neurological:     Mental Status: She is alert and oriented to person, place, and time.  Psychiatric:        Mood and Affect: Affect normal.    LABORATORY DATA:  I have reviewed the  data as listed Lab Results  Component Value Date   WBC 4.0 02/14/2020   HGB 11.6 (L) 02/14/2020   HCT 32.5 (L) 02/14/2020   MCV 92.1 02/14/2020   PLT 120 (L) 02/14/2020   Recent Labs    05/22/19 1411 06/17/19 1002 08/12/19 1301 11/18/19 1303 02/14/20 1318  NA 142   < > 138 141 142  K 4.3   < > 4.3 4.2 3.9  CL 106   < > 101 105 108  CO2 26   < > 26 26 21*  GLUCOSE 184*   < > 185* 150* 135*  BUN 29*   < > 38* 27* 30*  CREATININE 1.21*   < > 1.11* 0.82 0.84  CALCIUM 9.5   < > 10.0 9.8 9.5  GFRNONAA 42*   < > 46* >60 >60  GFRAA 48*   < > 54* >60 >60  PROT 6.9  --   --   --   --   ALBUMIN 3.9  --   --   --   --   AST 8*  --   --   --   --   ALT 13  --   --   --   --   ALKPHOS 71  --   --   --   --   BILITOT 0.4  --   --   --   --    < > = values in this interval not displayed.    RADIOGRAPHIC STUDIES: I have personally reviewed the radiological images as listed and agreed with the findings in the report. No results found.  ASSESSMENT & PLAN:   Iron deficiency #Chronic mild anemia-hemoglobin 11.6; June 2021-Iron sat-22%; s/p Venofer.  Hemoglobin is 11.5.  Proceed with Venofer; next week patient preference.  # ITP/ Thrombocytopenia-platelet count-120-STABLE; on surveillance.   # DISPOSITION: # HOLD Venofer; re-schedule IV venofer early next week/pt pref # follow up in 4 months/cbc/bmp-possible Venofer-Dr.B All questions were answered. The patient knows to call the clinic with any problems, questions or concerns.    Cammie Sickle, MD 02/18/2020 2:40 PM

## 2020-02-14 NOTE — Assessment & Plan Note (Addendum)
#  Chronic mild anemia-hemoglobin 11.6; June 2021-Iron sat-22%; s/p Venofer.  Hemoglobin is 11.5.  Proceed with Venofer; next week patient preference.  # ITP/ Thrombocytopenia-platelet count-120-STABLE; on surveillance.   # DISPOSITION: # HOLD Venofer; re-schedule IV venofer early next week/pt pref # follow up in 4 months/cbc/bmp-possible Venofer-Dr.B

## 2020-02-20 ENCOUNTER — Other Ambulatory Visit: Payer: Self-pay

## 2020-02-20 ENCOUNTER — Inpatient Hospital Stay: Payer: Medicare HMO

## 2020-02-20 VITALS — BP 167/68 | HR 60 | Temp 97.2°F | Resp 16

## 2020-02-20 DIAGNOSIS — D509 Iron deficiency anemia, unspecified: Secondary | ICD-10-CM

## 2020-02-20 DIAGNOSIS — D696 Thrombocytopenia, unspecified: Secondary | ICD-10-CM | POA: Diagnosis not present

## 2020-02-20 MED ORDER — IRON SUCROSE 20 MG/ML IV SOLN
200.0000 mg | Freq: Once | INTRAVENOUS | Status: AC
  Administered 2020-02-20: 200 mg via INTRAVENOUS
  Filled 2020-02-20: qty 10

## 2020-02-20 MED ORDER — SODIUM CHLORIDE 0.9 % IV SOLN
Freq: Once | INTRAVENOUS | Status: AC
  Filled 2020-02-20: qty 250

## 2020-03-04 DIAGNOSIS — H2000 Unspecified acute and subacute iridocyclitis: Secondary | ICD-10-CM | POA: Diagnosis not present

## 2020-03-23 DIAGNOSIS — R8290 Unspecified abnormal findings in urine: Secondary | ICD-10-CM | POA: Diagnosis not present

## 2020-03-23 DIAGNOSIS — R399 Unspecified symptoms and signs involving the genitourinary system: Secondary | ICD-10-CM | POA: Diagnosis not present

## 2020-04-05 IMAGING — RF DG UGI W/ SMALL BOWEL
13 of 16 series · 14 of 19 positions shown · non-contrast
Comparison: Abdominopelvic CT scan October 28, 2015

CLINICAL DATA: History of multiple abdominal surgeries, adhesions,
the patient reports upper abdominal pain and discomfort with
abdominal bloating intermittently for the past 2-3 years. History of
diverticulitis.

EXAM:
UPPER GI SERIES WITH SMALL BOWEL FOLLOW-THROUGH using barium
FLUOROSCOPY TIME:  Fluoroscopy Time:  1 minutes, 42 seconds
Radiation Exposure Index (if provided by the fluoroscopic device):
34 mGy
Number of Acquired Spot Images: 14
TECHNIQUE: Combined double contrast and single contrast upper GI series using
effervescent crystals, thick barium, and thin barium. A barium
tablet was administered. Subsequently, serial images of the small
bowel were obtained including spot views of the terminal ileum.

[Series 1: t abdomen supine · 0.15mm/px · 1 of 1 slices shown (1 of 2)]
[im 1/1]
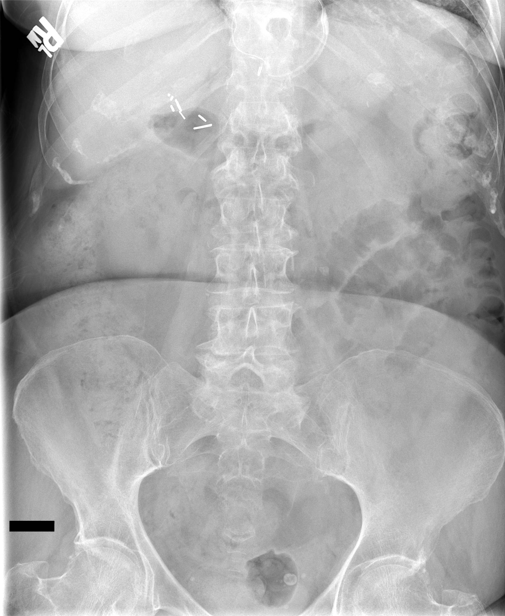

[Series 1: t abdomen supine · 0.14mm/px · 1 of 2 slices shown (2 of 2)]
[im 2/2]
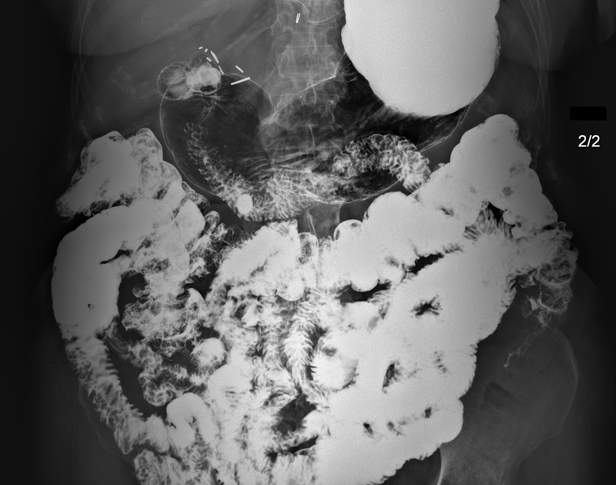

[Series 2: fluoro_barium swallow 2fps_bw · 0.17mm/px · 1 of 1 slices shown (1 of 9)]
[im 1/1]
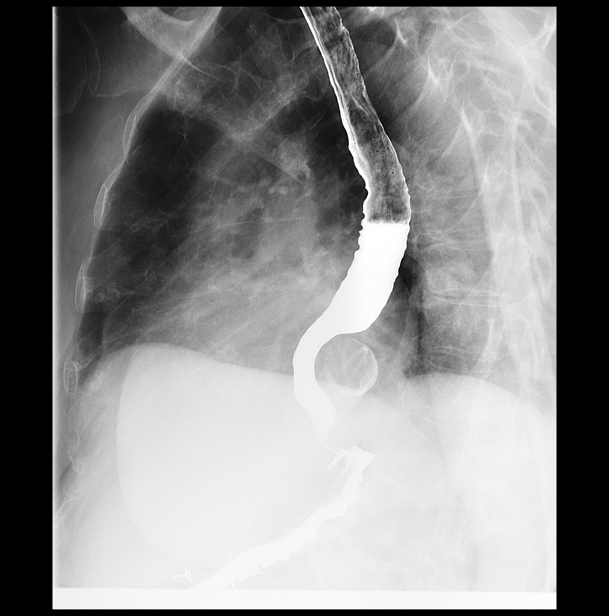

[Series 3: fluoro_barium 2fps_bw · 0.18mm/px · 1 of 2 frames shown (1 of 2)]
[frame 1/2]
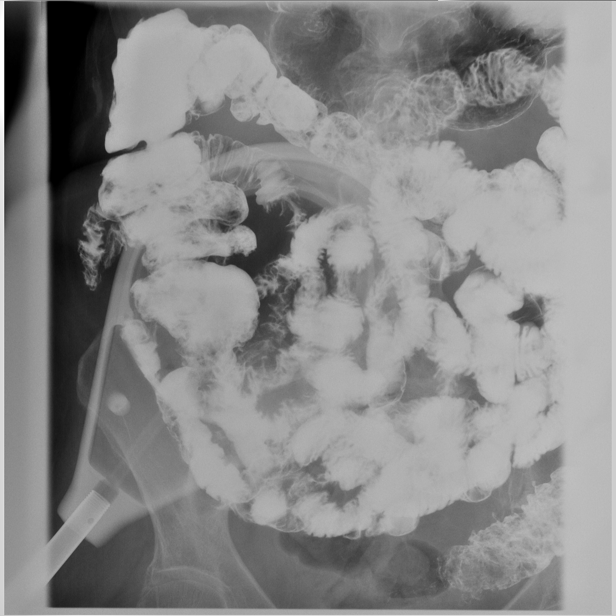

[Series 3: fluoro_barium swallow 2fps_bw · 0.17mm/px · 1 of 1 slices shown (2 of 9)]
[im 1/1]
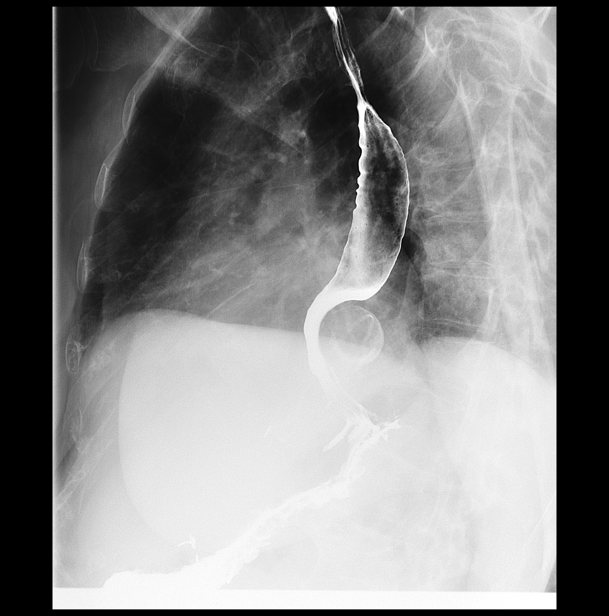

[Series 4: fluoro_barium 2fps_bw · 0.18mm/px · 2 of 2 slices shown (2 of 2)]
[im 1/2]
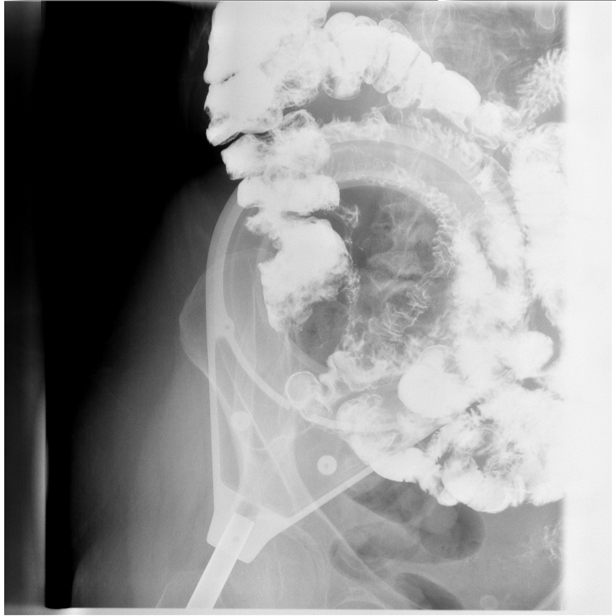
[im 2/2]
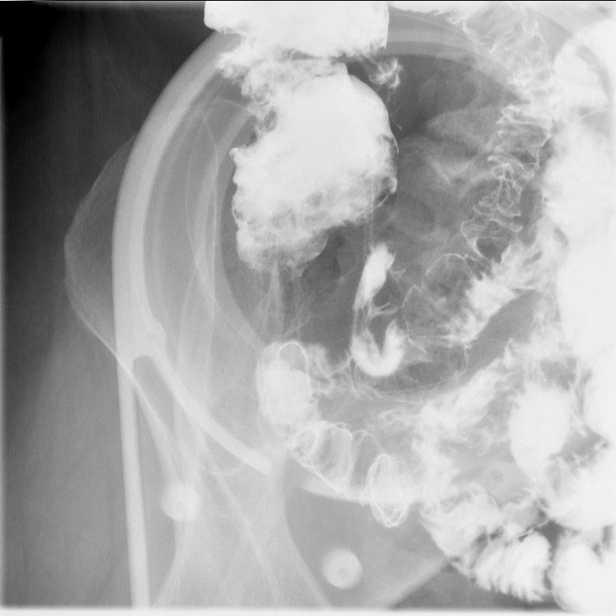

[Series 5: fluoro_barium swallow 2fps_bw · 0.17mm/px · 1 of 1 slices shown (3 of 9)]
[im 1/1]
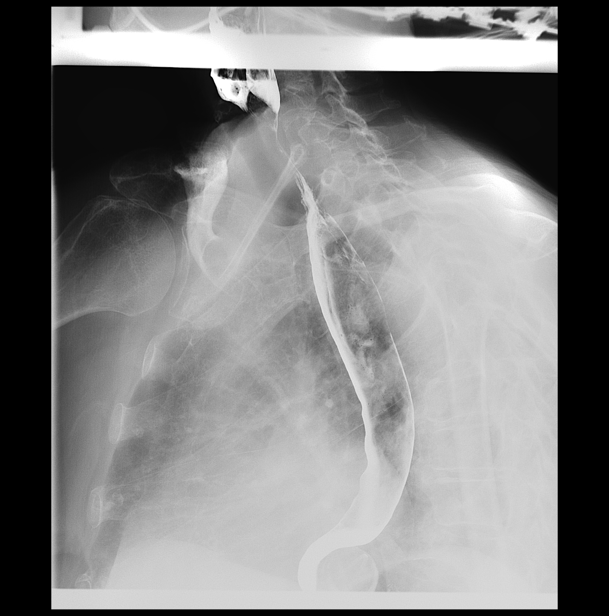

[Series 6: fluoro_barium swallow 2fps_bw · 0.17mm/px · 1 of 1 slices shown (4 of 9)]
[im 1/1]
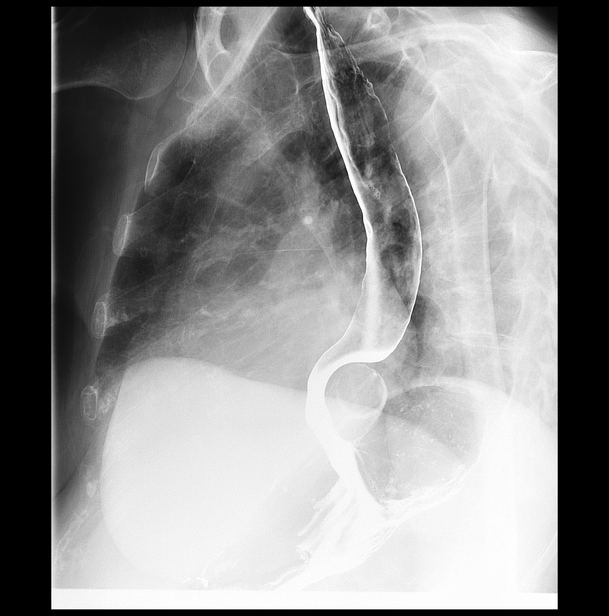

[Series 7: fluoro_barium swallow 2fps_bw · 0.18mm/px · 1 of 1 slices shown (5 of 9)]
[im 1/1]
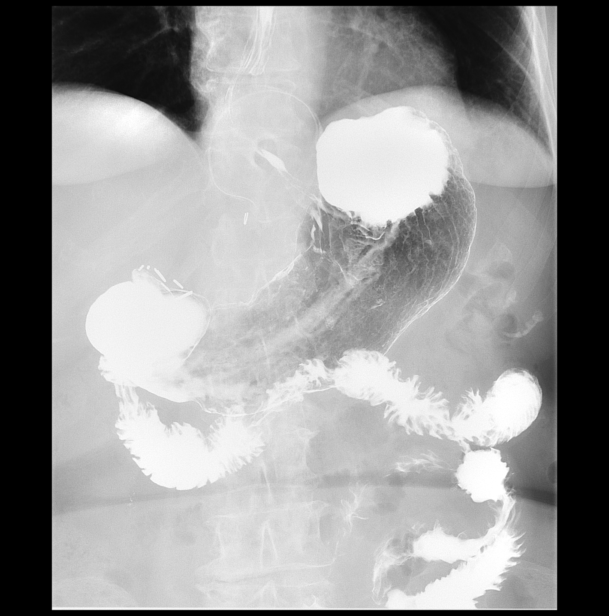

[Series 9: fluoro_barium swallow 2fps_bw · 0.19mm/px · 1 of 1 slices shown (6 of 9)]
[im 1/1]
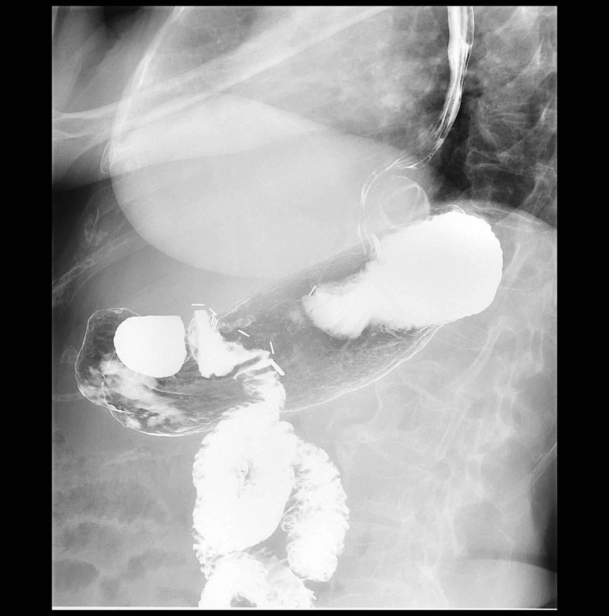

[Series 10: fluoro_barium swallow 2fps_bw · 0.19mm/px · 1 of 1 slices shown (7 of 9)]
[im 1/1]
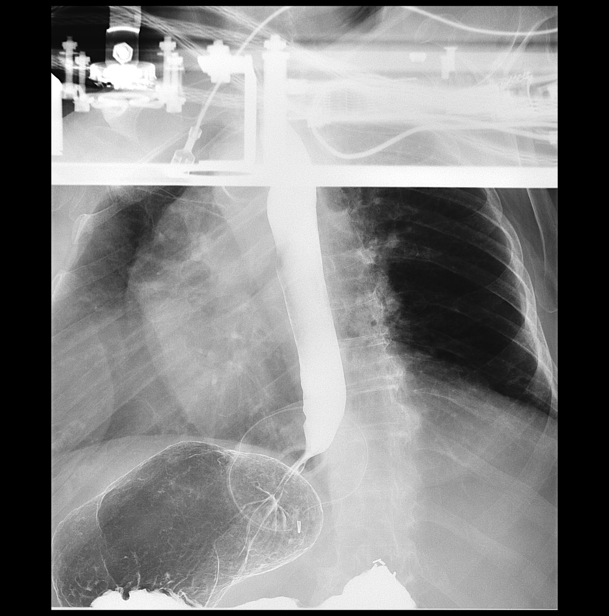

[Series 11: fluoro_barium swallow 2fps_bw · 0.19mm/px · 1 of 1 slices shown (8 of 9)]
[im 1/1]
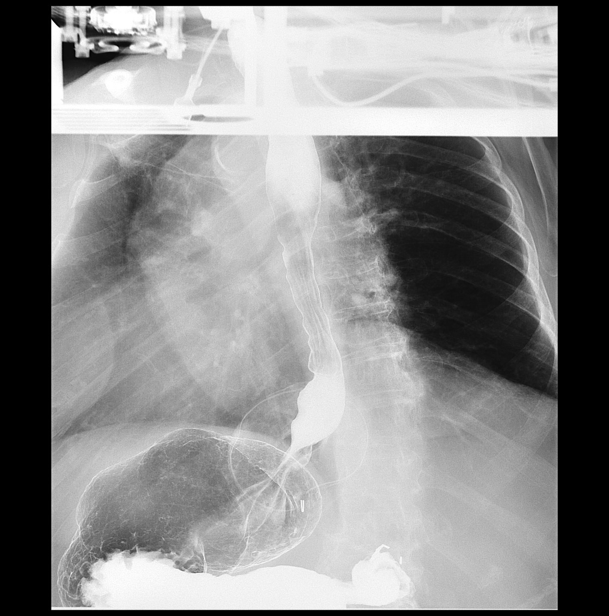

[Series 13: fluoro_barium swallow 2fps_bw · 0.19mm/px · 1 of 1 slices shown (9 of 9)]
[im 1/1]
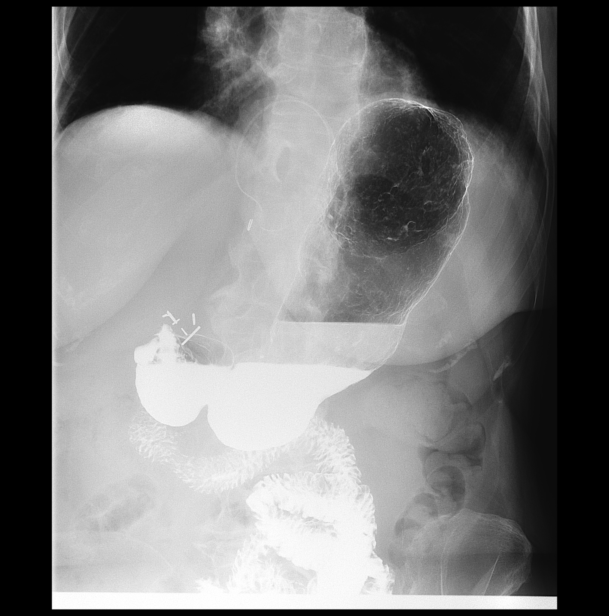

[14 of 19 positions shown; findings below may reference images not displayed]

FINDINGS: The scout radiograph revealed 4 loops of minimally distended
gas-filled small bowel to the left of midline in the mid abdomen.
There surgical clips in the gallbladder fossa. There is a moderate
amount of stool in the ascending colon. A mesh donut is present at
the GE junction.

The patient ingested thick and thin barium and the gas-forming
crystals without difficulty. The thoracic esophagus distended well.
A few tertiary contractions were observed. The barium tablet passed
promptly from the mouth through the mesh device into the stomach.
The stomach distended well. The mucosal pattern was normal. Gastric
emptying was prompt. The duodenal bulb and C sweep were normal. The
gastric mucosal folds were pliable.

Over the course of approximately 20 minutes the barium passed from
the stomach to the right colon. The jejunal and ileal loops
demonstrated normal caliber. A normal feathery mucosal pattern was
normal. The terminal ileum appeared normal. However, there was
tenderness to palpation focally in the right lower quadrant in the
region of the terminal ileum and cecum.
IMPRESSION: Mild changes of presbyesophagus. No evidence of obstruction at the
level of the GE junction. The mesh donut anti reflux chin device
appeared intact. No gastroesophageal reflux.

Normal appearance of the stomach and duodenum.

Normal appearance of the jejunum and ileum. Tenderness to palpation
over the normal appearing terminal ileum. Further evaluation with
abdominal and pelvic CT scanning with attention to the soft tissues
surrounding the terminal ileum would be useful to exclude
inflammatory bowel disease.

## 2020-04-17 DIAGNOSIS — Z85828 Personal history of other malignant neoplasm of skin: Secondary | ICD-10-CM | POA: Diagnosis not present

## 2020-04-17 DIAGNOSIS — D225 Melanocytic nevi of trunk: Secondary | ICD-10-CM | POA: Diagnosis not present

## 2020-04-17 DIAGNOSIS — L820 Inflamed seborrheic keratosis: Secondary | ICD-10-CM | POA: Diagnosis not present

## 2020-04-17 DIAGNOSIS — L821 Other seborrheic keratosis: Secondary | ICD-10-CM | POA: Diagnosis not present

## 2020-04-17 DIAGNOSIS — L538 Other specified erythematous conditions: Secondary | ICD-10-CM | POA: Diagnosis not present

## 2020-04-17 DIAGNOSIS — Z85820 Personal history of malignant melanoma of skin: Secondary | ICD-10-CM | POA: Diagnosis not present

## 2020-05-13 DIAGNOSIS — H40013 Open angle with borderline findings, low risk, bilateral: Secondary | ICD-10-CM | POA: Diagnosis not present

## 2020-05-13 DIAGNOSIS — H52223 Regular astigmatism, bilateral: Secondary | ICD-10-CM | POA: Diagnosis not present

## 2020-05-13 DIAGNOSIS — H524 Presbyopia: Secondary | ICD-10-CM | POA: Diagnosis not present

## 2020-05-13 DIAGNOSIS — H04123 Dry eye syndrome of bilateral lacrimal glands: Secondary | ICD-10-CM | POA: Diagnosis not present

## 2020-05-13 DIAGNOSIS — Z961 Presence of intraocular lens: Secondary | ICD-10-CM | POA: Diagnosis not present

## 2020-05-13 DIAGNOSIS — H5203 Hypermetropia, bilateral: Secondary | ICD-10-CM | POA: Diagnosis not present

## 2020-05-13 DIAGNOSIS — E103293 Type 1 diabetes mellitus with mild nonproliferative diabetic retinopathy without macular edema, bilateral: Secondary | ICD-10-CM | POA: Diagnosis not present

## 2020-05-25 ENCOUNTER — Telehealth: Payer: Self-pay | Admitting: *Deleted

## 2020-05-25 ENCOUNTER — Inpatient Hospital Stay: Payer: Medicare HMO | Attending: Hematology and Oncology

## 2020-05-25 ENCOUNTER — Other Ambulatory Visit: Payer: Self-pay | Admitting: *Deleted

## 2020-05-25 DIAGNOSIS — D509 Iron deficiency anemia, unspecified: Secondary | ICD-10-CM | POA: Diagnosis not present

## 2020-05-25 LAB — BASIC METABOLIC PANEL
Anion gap: 11 (ref 5–15)
BUN: 32 mg/dL — ABNORMAL HIGH (ref 8–23)
CO2: 26 mmol/L (ref 22–32)
Calcium: 9.1 mg/dL (ref 8.9–10.3)
Chloride: 104 mmol/L (ref 98–111)
Creatinine, Ser: 0.97 mg/dL (ref 0.44–1.00)
GFR, Estimated: 58 mL/min — ABNORMAL LOW (ref >60)
Glucose, Bld: 266 mg/dL — ABNORMAL HIGH (ref 70–99)
Potassium: 3.7 mmol/L (ref 3.5–5.1)
Sodium: 141 mmol/L (ref 135–145)

## 2020-05-25 LAB — CBC WITH DIFFERENTIAL/PLATELET
Abs Immature Granulocytes: 0.03 10*3/uL (ref 0.00–0.07)
Basophils Absolute: 0 10*3/uL (ref 0.0–0.1)
Basophils Relative: 1 %
Eosinophils Absolute: 0.1 10*3/uL (ref 0.0–0.5)
Eosinophils Relative: 3 %
HCT: 31.2 % — ABNORMAL LOW (ref 36.0–46.0)
Hemoglobin: 10.9 g/dL — ABNORMAL LOW (ref 12.0–15.0)
Immature Granulocytes: 1 %
Lymphocytes Relative: 17 %
Lymphs Abs: 0.8 10*3/uL (ref 0.7–4.0)
MCH: 33.1 pg (ref 26.0–34.0)
MCHC: 34.9 g/dL (ref 30.0–36.0)
MCV: 94.8 fL (ref 80.0–100.0)
Monocytes Absolute: 0.3 10*3/uL (ref 0.1–1.0)
Monocytes Relative: 7 %
Neutro Abs: 3.5 10*3/uL (ref 1.7–7.7)
Neutrophils Relative %: 71 %
Platelets: 117 10*3/uL — ABNORMAL LOW (ref 150–400)
RBC: 3.29 MIL/uL — ABNORMAL LOW (ref 3.87–5.11)
RDW: 12.4 % (ref 11.5–15.5)
WBC: 4.9 10*3/uL (ref 4.0–10.5)
nRBC: 0 % (ref 0.0–0.2)

## 2020-05-25 LAB — IRON AND TIBC
Iron: 76 ug/dL (ref 28–170)
Saturation Ratios: 18 % (ref 10.4–31.8)
TIBC: 420 ug/dL (ref 250–450)
UIBC: 344 ug/dL

## 2020-05-25 LAB — FERRITIN: Ferritin: 326 ng/mL — ABNORMAL HIGH (ref 11–307)

## 2020-05-25 NOTE — Telephone Encounter (Signed)
Per Dr. Jacinto Reap ok to have patient come in for lab only. Will determine if patient needs any additional apts after lab are resulted. Labs added - cbc, metb, ferr, iibc.   Colette, please call patient with apts.

## 2020-05-25 NOTE — Telephone Encounter (Signed)
Dr. Rogue Bussing - Patient contacted. Patient does not need any IV iron. Labs are currently stable. Patient instructed to keep her apts as scheduled on 06/15/2020

## 2020-05-25 NOTE — Telephone Encounter (Signed)
Patient called requesting to come in for lab check to see if her iron or platelet count is low as she is feeling weak. Please advise

## 2020-06-10 DIAGNOSIS — M50322 Other cervical disc degeneration at C5-C6 level: Secondary | ICD-10-CM | POA: Diagnosis not present

## 2020-06-10 DIAGNOSIS — H9202 Otalgia, left ear: Secondary | ICD-10-CM | POA: Diagnosis not present

## 2020-06-11 ENCOUNTER — Other Ambulatory Visit: Payer: Self-pay | Admitting: *Deleted

## 2020-06-11 DIAGNOSIS — D509 Iron deficiency anemia, unspecified: Secondary | ICD-10-CM

## 2020-06-15 ENCOUNTER — Inpatient Hospital Stay (HOSPITAL_BASED_OUTPATIENT_CLINIC_OR_DEPARTMENT_OTHER): Payer: Medicare HMO | Admitting: Internal Medicine

## 2020-06-15 ENCOUNTER — Other Ambulatory Visit: Payer: Self-pay | Admitting: *Deleted

## 2020-06-15 ENCOUNTER — Encounter: Payer: Self-pay | Admitting: Internal Medicine

## 2020-06-15 ENCOUNTER — Inpatient Hospital Stay: Payer: Medicare HMO | Attending: Internal Medicine

## 2020-06-15 ENCOUNTER — Inpatient Hospital Stay: Payer: Medicare HMO

## 2020-06-15 VITALS — BP 169/75 | HR 67 | Resp 18

## 2020-06-15 DIAGNOSIS — D509 Iron deficiency anemia, unspecified: Secondary | ICD-10-CM | POA: Diagnosis not present

## 2020-06-15 DIAGNOSIS — E611 Iron deficiency: Secondary | ICD-10-CM | POA: Diagnosis not present

## 2020-06-15 LAB — CBC WITH DIFFERENTIAL/PLATELET
Abs Immature Granulocytes: 0.03 10*3/uL (ref 0.00–0.07)
Basophils Absolute: 0 10*3/uL (ref 0.0–0.1)
Basophils Relative: 1 %
Eosinophils Absolute: 0.2 10*3/uL (ref 0.0–0.5)
Eosinophils Relative: 3 %
HCT: 32.2 % — ABNORMAL LOW (ref 36.0–46.0)
Hemoglobin: 11.2 g/dL — ABNORMAL LOW (ref 12.0–15.0)
Immature Granulocytes: 1 %
Lymphocytes Relative: 18 %
Lymphs Abs: 1 10*3/uL (ref 0.7–4.0)
MCH: 32.6 pg (ref 26.0–34.0)
MCHC: 34.8 g/dL (ref 30.0–36.0)
MCV: 93.6 fL (ref 80.0–100.0)
Monocytes Absolute: 0.5 10*3/uL (ref 0.1–1.0)
Monocytes Relative: 8 %
Neutro Abs: 4 10*3/uL (ref 1.7–7.7)
Neutrophils Relative %: 69 %
Platelets: 134 10*3/uL — ABNORMAL LOW (ref 150–400)
RBC: 3.44 MIL/uL — ABNORMAL LOW (ref 3.87–5.11)
RDW: 12.4 % (ref 11.5–15.5)
WBC: 5.7 10*3/uL (ref 4.0–10.5)
nRBC: 0 % (ref 0.0–0.2)

## 2020-06-15 LAB — BASIC METABOLIC PANEL
Anion gap: 10 (ref 5–15)
BUN: 28 mg/dL — ABNORMAL HIGH (ref 8–23)
CO2: 27 mmol/L (ref 22–32)
Calcium: 9.5 mg/dL (ref 8.9–10.3)
Chloride: 104 mmol/L (ref 98–111)
Creatinine, Ser: 0.74 mg/dL (ref 0.44–1.00)
GFR, Estimated: >60 mL/min (ref >60)
Glucose, Bld: 218 mg/dL — ABNORMAL HIGH (ref 70–99)
Potassium: 4.5 mmol/L (ref 3.5–5.1)
Sodium: 141 mmol/L (ref 135–145)

## 2020-06-15 MED ORDER — SODIUM CHLORIDE 0.9 % IV SOLN
Freq: Once | INTRAVENOUS | Status: AC
  Filled 2020-06-15: qty 250

## 2020-06-15 MED ORDER — IRON SUCROSE 20 MG/ML IV SOLN
200.0000 mg | Freq: Once | INTRAVENOUS | Status: AC
  Administered 2020-06-15: 200 mg via INTRAVENOUS
  Filled 2020-06-15: qty 10

## 2020-06-15 NOTE — Progress Notes (Signed)
Vitals reviewed with MD, per MD to continue with venofer infusion. Pt tolerated venofer infusion well with no signs of complications. VSS. Pt stable for discharge.   Kim Ballard [employer]

## 2020-06-15 NOTE — Assessment & Plan Note (Addendum)
#  Chronic mild anemia-hemoglobin 11.4; dec 2021--Iron sat-18%; s/p Venofer.    Proceed with Venofer; next week patient preference.  # ITP/ Thrombocytopenia-platelet count-134-STABLE; on surveillance.  # Elevated HTN- keep a log BPs; and defer to PCP, Dr.Hande.    # DISPOSITION: # proceed with  Venofer today # follow up in 4 months/cbc/bmp-possible Venofer-Dr.B

## 2020-06-15 NOTE — Progress Notes (Signed)
[facility] CONSULT NOTE  Patient Care Team: Tracie Harrier, MD as PCP - General (Internal Medicine) Tracie Harrier, MD as Physician Assistant (Internal Medicine) Lequita Asal, MD as Consulting Physician (Hematology and Oncology)  CHIEF COMPLAINTS/PURPOSE OF CONSULTATION: Iron deficiency anemia/thrombocytopenia  #Chronic [2015] mild anemia hemoglobin 11.1-question iron deficiency versus others.-Dr. Elliott/Dr. Mike Gip; s/p Venofer.   #Mild thrombocytopenia platelets 120s  #Fatigue/ PN/DM; MELANOMA of mid back [May 2021] s/p excision.   Oncology History   No history exists.    HISTORY OF PRESENTING ILLNESS:  Kim Ballard 86 y.o.  female longstanding history of iron deficient anemia/thrombocytopenia is here for follow-up.  Patient continues to complain of extreme fatigue.  No new shortness of breath or cough.  No blood in stools or black or stools.  Her symptoms of fatigue improved post IV iron infusion.  No easy bruising or mucosal bleeding..   Review of Systems  Constitutional: Positive for malaise/fatigue. Negative for chills, diaphoresis, fever and weight loss.  HENT: Negative for nosebleeds and sore throat.   Eyes: Negative for double vision.  Respiratory: Negative for cough, hemoptysis, sputum production, shortness of breath and wheezing.   Cardiovascular: Negative for chest pain, palpitations, orthopnea and leg swelling.  Gastrointestinal: Negative for blood in stool, constipation, diarrhea, heartburn, melena, nausea and vomiting.  Genitourinary: Negative for dysuria, frequency and urgency.  Musculoskeletal: Negative for back pain and joint pain.  Skin: Negative.  Negative for itching and rash.  Neurological: Positive for tingling. Negative for dizziness, focal weakness, weakness and headaches.  Endo/Heme/Allergies: Does not bruise/bleed easily.  Psychiatric/Behavioral: Negative for depression. The patient is not nervous/anxious and does not  have insomnia.      MEDICAL HISTORY:  Past Medical History:  Diagnosis Date  . Allergic state   . Anemia   . Arthritis   . Cancer ([place])    skin cancers  . Chronic cystitis with hematuria   . Depression   . Diabetes mellitus without complication ([place])   . Fibrocystic disease of both breasts   . Fibromyalgia   . GERD (gastroesophageal reflux disease)   . Hypertension   . IBS (irritable bowel syndrome)   . Nausea   . Skin cancer   . Wears dentures    full upper and lower    SURGICAL HISTORY: Past Surgical History:  Procedure Laterality Date  . ABDOMINAL HYSTERECTOMY    . APPENDECTOMY    . BREAST BIOPSY Right 2001   surgical bx   . CATARACT EXTRACTION W/PHACO Right 03/08/2017   Procedure: CATARACT EXTRACTION PHACO AND INTRAOCULAR LENS PLACEMENT ([place]) RIGHT DIABETIC TORIC;  Surgeon: Leandrew Koyanagi, MD;  Location: [place];  Service: Ophthalmology;  Laterality: Right;  Diabetic - oral meds  . CATARACT EXTRACTION W/PHACO Left 04/17/2017   Procedure: CATARACT EXTRACTION PHACO AND INTRAOCULAR LENS PLACEMENT ([place]) LEFT DIABETIC;  Surgeon: Leandrew Koyanagi, MD;  Location: [facility];  Service: Ophthalmology;  Laterality: Left;  diabetic-oral meds  . CHOLECYSTECTOMY    . COLONOSCOPY    . COLONOSCOPY WITH PROPOFOL N/A 08/27/2015   Procedure: COLONOSCOPY WITH PROPOFOL;  Surgeon: Manya Silvas, MD;  Location: [facility] ENDOSCOPY;  Service: Endoscopy;  Laterality: N/A;  . COLONOSCOPY WITH PROPOFOL N/A 05/02/2016   Procedure: COLONOSCOPY WITH PROPOFOL;  Surgeon: Manya Silvas, MD;  Location: [facility] ENDOSCOPY;  Service: Endoscopy;  Laterality: N/A;  . ESOPHAGOGASTRODUODENOSCOPY    . ESOPHAGOGASTRODUODENOSCOPY (EGD) WITH PROPOFOL N/A 08/27/2015   Procedure: ESOPHAGOGASTRODUODENOSCOPY (EGD) WITH PROPOFOL;  Surgeon: Manya Silvas, MD;  Location: [facility] ENDOSCOPY;  Service: Endoscopy;  Laterality: N/A;  . HERNIA REPAIR      SOCIAL HISTORY: Social History    Socioeconomic History  . Marital status: Married    Spouse name: Not on file  . Number of children: Not on file  . Years of education: Not on file  . Highest education level: Not on file  Occupational History  . Not on file  Tobacco Use  . Smoking status: Former Smoker    Quit date: 1960    Years since quitting: 62.0  . 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 Vaping Use: Never used  Substance and Sexual Activity  . Alcohol use: No  . Drug use: No  . Sexual activity: Not Currently  Other Topics Concern  . Not on file  Social History Narrative  . Not on file   Social Determinants of Health   Financial Resource Strain: Not on file  Food Insecurity: Not on file  Transportation Needs: Not on file  Physical Activity: Not on file  Stress: Not on file  Social Connections: Not on file  Intimate Partner Violence: Not on file    FAMILY HISTORY: Family History  Problem Relation Age of Onset  . Hypertension Mother   . Ovarian cancer Paternal Grandmother   . COPD Father   . Hyperlipidemia Father   . Kidney disease Father   . Diabetes Maternal Grandmother   . Breast cancer Neg Hx     ALLERGIES:  is allergic to biaxin [clarithromycin], brompheniramine maleate, ceclor [cefaclor], ciprofloxacin, clinoril [sulindac], codeine sulfate, diovan [valsartan], doxycycline, erythromycin, esomeprazole, fluoxetine, guaifenesin & derivatives, levaquin [levofloxacin in d5w], lisinopril, lodine [etodolac], macrodantin [nitrofurantoin macrocrystal], medrol [methylprednisolone], mobic [meloxicam], motrin [ibuprofen], nexium [esomeprazole magnesium], nitrofurantoin, norvasc [amlodipine besylate], omnicef [cefdinir], penicillins, prozac [fluoxetine hcl], pseudoephedrine, reglan [metoclopramide], seldane [terfenadine], sulfa antibiotics, toprol xl [metoprolol tartrate], triamterene, tussionex pennkinetic er [employer] polst-cpm polst er], and vibramycin [doxycycline calcium].  MEDICATIONS:   Current Outpatient Medications  Medication Sig Dispense Refill  . Ascorbic Acid (VITAMIN C) 1000 MG tablet Take 1,000 mg by mouth 2 times daily at 12 noon and 4 pm.    . cholecalciferol (VITAMIN D3) 25 MCG (1000 UT) tablet Take 1,000 Units by mouth 2 (two) times daily.    . clidinium-chlordiazePOXIDE (LIBRAX) 5-2.5 MG capsule Take 1 capsule by mouth.    . Cranberry 405 MG CAPS Take 2 capsules by mouth 2 (two) times daily.     Marland Kitchen FERREX 150 150 MG capsule     . fluticasone (FLONASE) 50 MCG/ACT nasal spray Place into both nostrils daily.    Marland Kitchen gabapentin (NEURONTIN) 100 MG capsule Take 1 capsule by mouth daily.    Marland Kitchen gemfibrozil (LOPID) 600 MG tablet Take 600 mg by mouth 2 (two) times daily before a meal.    . glipiZIDE (GLUCOTROL XL) 2.5 MG 24 hr tablet Take 2.5 mg by mouth daily with breakfast.    . glipiZIDE (GLUCOTROL XL) 5 MG 24 hr tablet Take 1 tablet by mouth daily.    . hydrochlorothiazide (HYDRODIURIL) 25 MG tablet Take 25 mg by mouth daily.    . Lactobacillus Rhamnosus, GG, ([employer] PROBIOTIC, LACTOBACILLUS, PO) Take by mouth.    . metFORMIN (GLUCOPHAGE) 500 MG tablet Take 1,000 mg by mouth 2 (two) times daily with a meal.     . Multiple Vitamins-Minerals (MULTIVITAMIN WITH MINERALS) tablet Take 1 tablet by mouth daily.    . OMEGA-3 FATTY ACIDS PO Take by mouth.    Marland Kitchen  pantoprazole (PROTONIX) 40 MG tablet Take 40 mg by mouth daily.    . vitamin B-12 (CYANOCOBALAMIN) 1000 MCG tablet Take 1,000 mcg by mouth daily.    . vitamin E 100 UNIT capsule Take by mouth daily.     No current facility-administered medications for this visit.   Facility-Administered Medications Ordered in Other Visits  Medication Dose Route Frequency Provider Last Rate Last Admin  . iron sucrose (VENOFER) injection 200 mg  200 mg Intravenous Once Nolon Stalls C, MD          .  PHYSICAL EXAMINATION: ECOG PERFORMANCE STATUS: 0 - Asymptomatic  Vitals:   06/15/20 1308  BP: (!) 177/77  Pulse: 75  Resp: 16   Temp: (!) 97.5 F (36.4 C)  SpO2: 100%   Filed Weights   06/15/20 1308  Weight: 152 lb (68.9 kg)    Physical Exam HENT:     Head: Normocephalic and atraumatic.     Mouth/Throat:     Pharynx: No oropharyngeal exudate.  Eyes:     Pupils: Pupils are equal, round, and reactive to light.  Cardiovascular:     Rate and Rhythm: Normal rate and regular rhythm.  Pulmonary:     Effort: Pulmonary effort is normal. No respiratory distress.     Breath sounds: Normal breath sounds. No wheezing.  Abdominal:     General: Bowel sounds are normal. There is no distension.     Palpations: Abdomen is soft. There is no mass.     Tenderness: There is no abdominal tenderness. There is no guarding or rebound.  Musculoskeletal:        General: No tenderness. Normal range of motion.     Cervical back: Normal range of motion and neck supple.  Skin:    General: Skin is warm.  Neurological:     Mental Status: She is alert and oriented to person, place, and time.  Psychiatric:        Mood and Affect: Affect normal.    LABORATORY DATA:  I have reviewed the data as listed Lab Results  Component Value Date   WBC 5.7 06/15/2020   HGB 11.2 (L) 06/15/2020   HCT 32.2 (L) 06/15/2020   MCV 93.6 06/15/2020   PLT 134 (L) 06/15/2020   Recent Labs    08/12/19 1301 11/18/19 1303 02/14/20 1318 05/25/20 1113 06/15/20 1243  NA 138 141 142 141 141  K 4.3 4.2 3.9 3.7 4.5  CL 101 105 108 104 104  CO2 26 26 21* 26 27  GLUCOSE 185* 150* 135* 266* 218*  BUN 38* 27* 30* 32* 28*  CREATININE 1.11* 0.82 0.84 0.97 0.74  CALCIUM 10.0 9.8 9.5 9.1 9.5  GFRNONAA 46* >60 >60 58* >60  GFRAA 54* >60 >60  --   --     RADIOGRAPHIC STUDIES: I have personally reviewed the radiological images as listed and agreed with the findings in the report. No results found.  ASSESSMENT & PLAN:   Iron deficiency #Chronic mild anemia-hemoglobin 11.4; dec 2021--Iron sat-18%; s/p Venofer.    Proceed with Venofer; next week  patient preference.  # ITP/ Thrombocytopenia-platelet count-134-STABLE; on surveillance.  # Elevated HTN- keep a log BPs; and defer to PCP, Dr.Hande.    # DISPOSITION: # proceed with  Venofer today # follow up in 4 months/cbc/bmp-possible Venofer-Dr.B All questions were answered. The patient knows to call the clinic with any problems, questions or concerns.    Cammie Sickle, MD 06/15/2020 1:30 PM

## 2020-07-07 DIAGNOSIS — E119 Type 2 diabetes mellitus without complications: Secondary | ICD-10-CM | POA: Diagnosis not present

## 2020-07-07 DIAGNOSIS — M792 Neuralgia and neuritis, unspecified: Secondary | ICD-10-CM | POA: Diagnosis not present

## 2020-07-07 DIAGNOSIS — H9202 Otalgia, left ear: Secondary | ICD-10-CM | POA: Diagnosis not present

## 2020-07-07 DIAGNOSIS — Z85820 Personal history of malignant melanoma of skin: Secondary | ICD-10-CM | POA: Diagnosis not present

## 2020-07-07 DIAGNOSIS — Z0000 Encounter for general adult medical examination without abnormal findings: Secondary | ICD-10-CM | POA: Diagnosis not present

## 2020-07-07 DIAGNOSIS — Z79899 Other long term (current) drug therapy: Secondary | ICD-10-CM | POA: Diagnosis not present

## 2020-07-07 DIAGNOSIS — D649 Anemia, unspecified: Secondary | ICD-10-CM | POA: Diagnosis not present

## 2020-07-07 DIAGNOSIS — I10 Essential (primary) hypertension: Secondary | ICD-10-CM | POA: Diagnosis not present

## 2020-07-07 DIAGNOSIS — Z23 Encounter for immunization: Secondary | ICD-10-CM | POA: Diagnosis not present

## 2020-07-08 DIAGNOSIS — R8290 Unspecified abnormal findings in urine: Secondary | ICD-10-CM | POA: Diagnosis not present

## 2020-07-08 DIAGNOSIS — R5383 Other fatigue: Secondary | ICD-10-CM | POA: Diagnosis not present

## 2020-07-08 DIAGNOSIS — E1165 Type 2 diabetes mellitus with hyperglycemia: Secondary | ICD-10-CM | POA: Diagnosis not present

## 2020-07-08 DIAGNOSIS — H9202 Otalgia, left ear: Secondary | ICD-10-CM | POA: Diagnosis not present

## 2020-07-08 DIAGNOSIS — D696 Thrombocytopenia, unspecified: Secondary | ICD-10-CM | POA: Diagnosis not present

## 2020-07-08 DIAGNOSIS — I10 Essential (primary) hypertension: Secondary | ICD-10-CM | POA: Diagnosis not present

## 2020-07-08 DIAGNOSIS — H539 Unspecified visual disturbance: Secondary | ICD-10-CM | POA: Diagnosis not present

## 2020-07-08 DIAGNOSIS — Z0000 Encounter for general adult medical examination without abnormal findings: Secondary | ICD-10-CM | POA: Diagnosis not present

## 2020-07-08 DIAGNOSIS — D509 Iron deficiency anemia, unspecified: Secondary | ICD-10-CM | POA: Diagnosis not present

## 2020-07-08 DIAGNOSIS — G5793 Unspecified mononeuropathy of bilateral lower limbs: Secondary | ICD-10-CM | POA: Diagnosis not present

## 2020-07-14 DIAGNOSIS — H524 Presbyopia: Secondary | ICD-10-CM | POA: Diagnosis not present

## 2020-07-14 DIAGNOSIS — Z961 Presence of intraocular lens: Secondary | ICD-10-CM | POA: Diagnosis not present

## 2020-07-14 DIAGNOSIS — H04123 Dry eye syndrome of bilateral lacrimal glands: Secondary | ICD-10-CM | POA: Diagnosis not present

## 2020-07-14 DIAGNOSIS — H52223 Regular astigmatism, bilateral: Secondary | ICD-10-CM | POA: Diagnosis not present

## 2020-07-14 DIAGNOSIS — E113293 Type 2 diabetes mellitus with mild nonproliferative diabetic retinopathy without macular edema, bilateral: Secondary | ICD-10-CM | POA: Diagnosis not present

## 2020-07-14 DIAGNOSIS — H1045 Other chronic allergic conjunctivitis: Secondary | ICD-10-CM | POA: Diagnosis not present

## 2020-07-14 DIAGNOSIS — H5203 Hypermetropia, bilateral: Secondary | ICD-10-CM | POA: Diagnosis not present

## 2020-07-14 DIAGNOSIS — H40013 Open angle with borderline findings, low risk, bilateral: Secondary | ICD-10-CM | POA: Diagnosis not present

## 2020-07-23 DIAGNOSIS — H2000 Unspecified acute and subacute iridocyclitis: Secondary | ICD-10-CM | POA: Diagnosis not present

## 2020-07-29 DIAGNOSIS — H903 Sensorineural hearing loss, bilateral: Secondary | ICD-10-CM | POA: Insufficient documentation

## 2020-07-29 DIAGNOSIS — R059 Cough, unspecified: Secondary | ICD-10-CM | POA: Diagnosis not present

## 2020-07-29 DIAGNOSIS — H9202 Otalgia, left ear: Secondary | ICD-10-CM | POA: Diagnosis not present

## 2020-07-29 DIAGNOSIS — H6121 Impacted cerumen, right ear: Secondary | ICD-10-CM | POA: Diagnosis not present

## 2020-08-04 DIAGNOSIS — H2000 Unspecified acute and subacute iridocyclitis: Secondary | ICD-10-CM | POA: Diagnosis not present

## 2020-08-13 ENCOUNTER — Other Ambulatory Visit: Payer: Self-pay | Admitting: Internal Medicine

## 2020-08-13 DIAGNOSIS — Z1231 Encounter for screening mammogram for malignant neoplasm of breast: Secondary | ICD-10-CM

## 2020-09-01 DIAGNOSIS — H2000 Unspecified acute and subacute iridocyclitis: Secondary | ICD-10-CM | POA: Diagnosis not present

## 2020-09-07 DIAGNOSIS — D649 Anemia, unspecified: Secondary | ICD-10-CM | POA: Diagnosis not present

## 2020-09-07 DIAGNOSIS — D696 Thrombocytopenia, unspecified: Secondary | ICD-10-CM | POA: Diagnosis not present

## 2020-09-07 DIAGNOSIS — Z03818 Encounter for observation for suspected exposure to other biological agents ruled out: Secondary | ICD-10-CM | POA: Diagnosis not present

## 2020-09-07 DIAGNOSIS — J019 Acute sinusitis, unspecified: Secondary | ICD-10-CM | POA: Diagnosis not present

## 2020-09-07 DIAGNOSIS — J029 Acute pharyngitis, unspecified: Secondary | ICD-10-CM | POA: Diagnosis not present

## 2020-09-07 DIAGNOSIS — I10 Essential (primary) hypertension: Secondary | ICD-10-CM | POA: Diagnosis not present

## 2020-09-07 DIAGNOSIS — C439 Malignant melanoma of skin, unspecified: Secondary | ICD-10-CM | POA: Diagnosis not present

## 2020-09-07 DIAGNOSIS — E119 Type 2 diabetes mellitus without complications: Secondary | ICD-10-CM | POA: Diagnosis not present

## 2020-09-07 DIAGNOSIS — E1165 Type 2 diabetes mellitus with hyperglycemia: Secondary | ICD-10-CM | POA: Diagnosis not present

## 2020-09-08 ENCOUNTER — Ambulatory Visit
Admission: RE | Admit: 2020-09-08 | Discharge: 2020-09-08 | Disposition: A | Discharge status: Home / Self Care | Payer: Medicare HMO | Source: Ambulatory Visit | Attending: Internal Medicine | Admitting: Internal Medicine

## 2020-09-08 ENCOUNTER — Other Ambulatory Visit: Payer: Self-pay

## 2020-09-08 DIAGNOSIS — R8290 Unspecified abnormal findings in urine: Secondary | ICD-10-CM | POA: Diagnosis not present

## 2020-09-08 DIAGNOSIS — Z1231 Encounter for screening mammogram for malignant neoplasm of breast: Secondary | ICD-10-CM | POA: Diagnosis not present

## 2020-09-23 DIAGNOSIS — L820 Inflamed seborrheic keratosis: Secondary | ICD-10-CM | POA: Diagnosis not present

## 2020-09-23 DIAGNOSIS — R208 Other disturbances of skin sensation: Secondary | ICD-10-CM | POA: Diagnosis not present

## 2020-10-13 ENCOUNTER — Other Ambulatory Visit: Payer: Self-pay

## 2020-10-13 ENCOUNTER — Encounter: Payer: Self-pay | Admitting: Internal Medicine

## 2020-10-13 ENCOUNTER — Inpatient Hospital Stay (HOSPITAL_BASED_OUTPATIENT_CLINIC_OR_DEPARTMENT_OTHER): Payer: Medicare HMO | Admitting: Internal Medicine

## 2020-10-13 ENCOUNTER — Inpatient Hospital Stay: Payer: Medicare HMO | Attending: Internal Medicine

## 2020-10-13 ENCOUNTER — Inpatient Hospital Stay: Payer: Medicare HMO

## 2020-10-13 ENCOUNTER — Telehealth: Payer: Self-pay | Admitting: Internal Medicine

## 2020-10-13 VITALS — BP 169/85 | HR 78 | Temp 97.6°F | Resp 20 | Ht 67.0 in

## 2020-10-13 VITALS — BP 180/79 | HR 71 | Resp 16

## 2020-10-13 DIAGNOSIS — E611 Iron deficiency: Secondary | ICD-10-CM

## 2020-10-13 DIAGNOSIS — Z79899 Other long term (current) drug therapy: Secondary | ICD-10-CM | POA: Diagnosis not present

## 2020-10-13 DIAGNOSIS — D509 Iron deficiency anemia, unspecified: Secondary | ICD-10-CM | POA: Insufficient documentation

## 2020-10-13 DIAGNOSIS — R300 Dysuria: Secondary | ICD-10-CM

## 2020-10-13 LAB — BASIC METABOLIC PANEL
Anion gap: 12 (ref 5–15)
BUN: 36 mg/dL — ABNORMAL HIGH (ref 8–23)
CO2: 25 mmol/L (ref 22–32)
Calcium: 9.8 mg/dL (ref 8.9–10.3)
Chloride: 103 mmol/L (ref 98–111)
Creatinine, Ser: 1.51 mg/dL — ABNORMAL HIGH (ref 0.44–1.00)
GFR, Estimated: 34 mL/min — ABNORMAL LOW (ref >60)
Glucose, Bld: 180 mg/dL — ABNORMAL HIGH (ref 70–99)
Potassium: 4.7 mmol/L (ref 3.5–5.1)
Sodium: 140 mmol/L (ref 135–145)

## 2020-10-13 LAB — CBC WITH DIFFERENTIAL/PLATELET
Abs Immature Granulocytes: 0.03 10*3/uL (ref 0.00–0.07)
Basophils Absolute: 0 10*3/uL (ref 0.0–0.1)
Basophils Relative: 1 %
Eosinophils Absolute: 0.2 10*3/uL (ref 0.0–0.5)
Eosinophils Relative: 5 %
HCT: 33 % — ABNORMAL LOW (ref 36.0–46.0)
Hemoglobin: 11.1 g/dL — ABNORMAL LOW (ref 12.0–15.0)
Immature Granulocytes: 1 %
Lymphocytes Relative: 20 %
Lymphs Abs: 1 10*3/uL (ref 0.7–4.0)
MCH: 31.7 pg (ref 26.0–34.0)
MCHC: 33.6 g/dL (ref 30.0–36.0)
MCV: 94.3 fL (ref 80.0–100.0)
Monocytes Absolute: 0.4 10*3/uL (ref 0.1–1.0)
Monocytes Relative: 8 %
Neutro Abs: 3.2 10*3/uL (ref 1.7–7.7)
Neutrophils Relative %: 65 %
Platelets: 128 10*3/uL — ABNORMAL LOW (ref 150–400)
RBC: 3.5 MIL/uL — ABNORMAL LOW (ref 3.87–5.11)
RDW: 12.7 % (ref 11.5–15.5)
WBC: 4.9 10*3/uL (ref 4.0–10.5)
nRBC: 0 % (ref 0.0–0.2)

## 2020-10-13 LAB — URINALYSIS, COMPLETE (UACMP) WITH MICROSCOPIC
Bacteria, UA: NONE SEEN
Bilirubin Urine: NEGATIVE
Glucose, UA: NEGATIVE mg/dL
Hgb urine dipstick: NEGATIVE
Ketones, ur: NEGATIVE mg/dL
Leukocytes,Ua: NEGATIVE
Nitrite: NEGATIVE
Protein, ur: 30 mg/dL — AB
Specific Gravity, Urine: 1.016 (ref 1.005–1.030)
pH: 5 (ref 5.0–8.0)

## 2020-10-13 MED ORDER — SODIUM CHLORIDE 0.9 % IV SOLN
Freq: Once | INTRAVENOUS | Status: AC
Start: 2020-10-13 — End: 2020-10-13
  Filled 2020-10-13: qty 250

## 2020-10-13 MED ORDER — IRON SUCROSE 20 MG/ML IV SOLN
200.0000 mg | Freq: Once | INTRAVENOUS | Status: AC
  Administered 2020-10-13: 200 mg via INTRAVENOUS
  Filled 2020-10-13: qty 10

## 2020-10-13 NOTE — Telephone Encounter (Signed)
On 5/10-spoke to patient regarding results of the urine analysis negative for infection.  Question kidney stone-recommend follow-up with urology ASAP.  Follow-up with [place] as planned.  C-please mail a copy of her future appointments.  GB

## 2020-10-13 NOTE — Progress Notes (Signed)
[place] CONSULT NOTE  Patient Care Team: Tracie Harrier, MD as PCP - General (Internal Medicine) Tracie Harrier, MD as Physician Assistant (Internal Medicine) Lequita Asal, MD as Consulting Physician (Hematology and Oncology)  CHIEF COMPLAINTS/PURPOSE OF CONSULTATION: Iron deficiency anemia/thrombocytopenia  #Chronic [2015] mild anemia hemoglobin 11.1-question iron deficiency versus others.-Dr. Elliott/Dr. Mike Gip; s/p Venofer.   #Mild thrombocytopenia platelets 120s  #Fatigue/ PN/DM; MELANOMA of mid back [May 2021] s/p excision.   Oncology History   No history exists.    HISTORY OF PRESENTING ILLNESS:  Kim Ballard 86 y.o.  female longstanding history of iron deficient anemia/thrombocytopenia is here for follow-up.  Patient complains of burning pain with urination.  Increased frequency.  Complains of left-sided flank pain.  No fevers.  Admits to prior history of kidney stones.  Continues to complain of fatigue.  No nausea no vomiting.  No blood in stools or black-colored stools.  No easy bruising or bleeding.    Review of Systems  Constitutional: Positive for malaise/fatigue. Negative for chills, diaphoresis, fever and weight loss.  HENT: Negative for nosebleeds and sore throat.   Eyes: Negative for double vision.  Respiratory: Negative for cough, hemoptysis, sputum production, shortness of breath and wheezing.   Cardiovascular: Negative for chest pain, palpitations, orthopnea and leg swelling.  Gastrointestinal: Negative for blood in stool, constipation, diarrhea, heartburn, melena, nausea and vomiting.  Genitourinary: Positive for dysuria, flank pain, frequency and urgency.  Musculoskeletal: Negative for back pain and joint pain.  Skin: Negative.  Negative for itching and rash.  Neurological: Positive for tingling. Negative for dizziness, focal weakness, weakness and headaches.  Endo/Heme/Allergies: Does not bruise/bleed easily.   Psychiatric/Behavioral: Negative for depression. The patient is not nervous/anxious and does not have insomnia.      MEDICAL HISTORY:  Past Medical History:  Diagnosis Date  . Allergic state   . Anemia   . Arthritis   . Cancer ([place])    skin cancers  . Chronic cystitis with hematuria   . Depression   . Diabetes mellitus without complication ([place])   . Fibrocystic disease of both breasts   . Fibromyalgia   . GERD (gastroesophageal reflux disease)   . Hypertension   . IBS (irritable bowel syndrome)   . Nausea   . Skin cancer   . Wears dentures    full upper and lower    SURGICAL HISTORY: Past Surgical History:  Procedure Laterality Date  . ABDOMINAL HYSTERECTOMY    . APPENDECTOMY    . BREAST BIOPSY Right 2001   surgical bx   . CATARACT EXTRACTION W/PHACO Right 03/08/2017   Procedure: CATARACT EXTRACTION PHACO AND INTRAOCULAR LENS PLACEMENT ([place]) RIGHT DIABETIC TORIC;  Surgeon: Leandrew Koyanagi, MD;  Location: [facility];  Service: Ophthalmology;  Laterality: Right;  Diabetic - oral meds  . CATARACT EXTRACTION W/PHACO Left 04/17/2017   Procedure: CATARACT EXTRACTION PHACO AND INTRAOCULAR LENS PLACEMENT ([place]) LEFT DIABETIC;  Surgeon: Leandrew Koyanagi, MD;  Location: [facility];  Service: Ophthalmology;  Laterality: Left;  diabetic-oral meds  . CHOLECYSTECTOMY    . COLONOSCOPY    . COLONOSCOPY WITH PROPOFOL N/A 08/27/2015   Procedure: COLONOSCOPY WITH PROPOFOL;  Surgeon: Manya Silvas, MD;  Location: [facility] ENDOSCOPY;  Service: Endoscopy;  Laterality: N/A;  . COLONOSCOPY WITH PROPOFOL N/A 05/02/2016   Procedure: COLONOSCOPY WITH PROPOFOL;  Surgeon: Manya Silvas, MD;  Location[facility] ENDOSCOPY;  Service: Endoscopy;  Laterality: N/A;  . ESOPHAGOGASTRODUODENOSCOPY    . ESOPHAGOGASTRODUODENOSCOPY (EGD) WITH PROPOFOL N/A  08/27/2015   Procedure: ESOPHAGOGASTRODUODENOSCOPY (EGD) WITH PROPOFOL;  Surgeon: Manya Silvas, MD;  Location: [facility] ENDOSCOPY;   Service: Endoscopy;  Laterality: N/A;  . HERNIA REPAIR      SOCIAL HISTORY: Social History   Socioeconomic History  . Marital status: Married    Spouse name: Not on file  . Number of children: Not on file  . Years of education: Not on file  . Highest education level: Not on file  Occupational History  . Not on file  Tobacco Use  . Smoking status: Former Smoker    Quit date: 1960    Years since quitting: 62.3  . Smokeless tobacco: Never Used  Vaping Use  . Vaping Use: Never used  Substance and Sexual Activity  . Alcohol use: No  . Drug use: No  . Sexual activity: Not Currently  Other Topics Concern  . Not on file  Social History Narrative  . Not on file   Social Determinants of Health   Financial Resource Strain: Not on file  Food Insecurity: Not on file  Transportation Needs: Not on file  Physical Activity: Not on file  Stress: Not on file  Social Connections: Not on file  Intimate Partner Violence: Not on file    FAMILY HISTORY: Family History  Problem Relation Age of Onset  . Hypertension Mother   . Ovarian cancer Paternal Grandmother   . COPD Father   . Hyperlipidemia Father   . Kidney disease Father   . Diabetes Maternal Grandmother   . Breast cancer Neg Hx     ALLERGIES:  is allergic to biaxin [clarithromycin], brompheniramine maleate, ceclor [cefaclor], ciprofloxacin, clinoril [sulindac], codeine sulfate, diovan [valsartan], doxycycline, erythromycin, esomeprazole, fluoxetine, guaifenesin & derivatives, levaquin [levofloxacin in d5w], lisinopril, lodine [etodolac], macrodantin [nitrofurantoin macrocrystal], medrol [methylprednisolone], mobic [meloxicam], motrin [ibuprofen], nexium [esomeprazole magnesium], nitrofurantoin, norvasc [amlodipine besylate], omnicef [cefdinir], penicillins, prozac [fluoxetine hcl], pseudoephedrine, reglan [metoclopramide], seldane [terfenadine], sulfa antibiotics, toprol xl [metoprolol tartrate], triamterene, tussionex  pennkinetic er [employer] polst-cpm polst er], and vibramycin [doxycycline calcium].  MEDICATIONS:  Current Outpatient Medications  Medication Sig Dispense Refill  . Ascorbic Acid (VITAMIN C) 1000 MG tablet Take 1,000 mg by mouth 2 times daily at 12 noon and 4 pm.    . cholecalciferol (VITAMIN D3) 25 MCG (1000 UT) tablet Take 1,000 Units by mouth 2 (two) times daily.    . clidinium-chlordiazePOXIDE (LIBRAX) 5-2.5 MG capsule Take 1 capsule by mouth.    . Cranberry 405 MG CAPS Take 2 capsules by mouth 2 (two) times daily.     Marland Kitchen FERREX 150 150 MG capsule     . fluticasone (FLONASE) 50 MCG/ACT nasal spray Place into both nostrils daily.    Marland Kitchen gemfibrozil (LOPID) 600 MG tablet Take 600 mg by mouth 2 (two) times daily before a meal.    . glipiZIDE (GLUCOTROL XL) 5 MG 24 hr tablet Take 1 tablet by mouth daily.    . hydrochlorothiazide (HYDRODIURIL) 25 MG tablet Take 25 mg by mouth daily.    . Lactobacillus Rhamnosus, GG, ([employer] PROBIOTIC, LACTOBACILLUS, PO) Take by mouth.    . metFORMIN (GLUCOPHAGE) 500 MG tablet Take 1,000 mg by mouth 2 (two) times daily with a meal.     . Multiple Vitamins-Minerals (MULTIVITAMIN WITH MINERALS) tablet Take 1 tablet by mouth daily.    . OMEGA-3 FATTY ACIDS PO Take by mouth.    . pantoprazole (PROTONIX) 40 MG tablet Take 40 mg by mouth daily.    . vitamin B-12 (CYANOCOBALAMIN) 1000  MCG tablet Take 1,000 mcg by mouth daily.    . vitamin E 100 UNIT capsule Take by mouth daily.    Marland Kitchen gabapentin (NEURONTIN) 100 MG capsule Take 1 capsule by mouth daily. (Patient not taking: Reported on 10/13/2020)     No current facility-administered medications for this visit.   Facility-Administered Medications Ordered in Other Visits  Medication Dose Route Frequency Provider Last Rate Last Admin  . iron sucrose (VENOFER) injection 200 mg  200 mg Intravenous Once Lequita Asal, MD          .  PHYSICAL EXAMINATION: ECOG PERFORMANCE STATUS: 0 - Asymptomatic  Vitals:    10/13/20 1329 10/13/20 1330  BP: (!) 191/84 (!) 169/85  Pulse:  78  Resp:  20  Temp: 97.6 F (36.4 C)    There were no vitals filed for this visit.  Physical Exam Constitutional:      Comments: Patient is alone.  Ambulating dependently.  HENT:     Head: Normocephalic and atraumatic.     Mouth/Throat:     Pharynx: No oropharyngeal exudate.  Eyes:     Pupils: Pupils are equal, round, and reactive to light.  Cardiovascular:     Rate and Rhythm: Normal rate and regular rhythm.  Pulmonary:     Effort: Pulmonary effort is normal. No respiratory distress.     Breath sounds: Normal breath sounds. No wheezing.  Abdominal:     General: Bowel sounds are normal. There is no distension.     Palpations: Abdomen is soft. There is no mass.     Tenderness: There is no abdominal tenderness. There is no guarding or rebound.  Musculoskeletal:        General: No tenderness. Normal range of motion.     Cervical back: Normal range of motion and neck supple.  Skin:    General: Skin is warm.  Neurological:     Mental Status: She is alert and oriented to person, place, and time.  Psychiatric:        Mood and Affect: Affect normal.    LABORATORY DATA:  I have reviewed the data as listed Lab Results  Component Value Date   WBC 4.9 10/13/2020   HGB 11.1 (L) 10/13/2020   HCT 33.0 (L) 10/13/2020   MCV 94.3 10/13/2020   PLT 128 (L) 10/13/2020   Recent Labs    11/18/19 1303 02/14/20 1318 05/25/20 1113 06/15/20 1243 10/13/20 1305  NA 141 142 141 141 140  K 4.2 3.9 3.7 4.5 4.7  CL 105 108 104 104 103  CO2 26 21* 26 27 25   GLUCOSE 150* 135* 266* 218* 180*  BUN 27* 30* 32* 28* 36*  CREATININE 0.82 0.84 0.97 0.74 1.51*  CALCIUM 9.8 9.5 9.1 9.5 9.8  GFRNONAA >60 >60 58* >60 34*  GFRAA >60 >60  --   --   --     RADIOGRAPHIC STUDIES: I have personally reviewed the radiological images as listed and agreed with the findings in the report. No results found.  ASSESSMENT & PLAN:   Iron  deficiency #Chronic mild anemia-hemoglobin 11.2; dec 2021--Iron sat-18%; s/p Venofer.  Proceed with Venofer today.  # ITP/ Thrombocytopenia-platelet count-129-STABLE; on surveillance.  # Elevated HTN- keep a log BPs; stable.  And defer to PCP, Dr.Hande.   #Eelevated creatinine- 1.5 [baseline ~1.0;Hx of kidney stones Dr.McDermott; Urology]- ?  History of multiple drug-resistant UTIs.  Previously used cephalexin 500 3 times daily [as per patient].  Await UA and culture.  Recommend  avoiding NSAIDs.  # DISPOSITION: # UA & culture today # proceed with  Venofer today # follow up in 4 months/cbc/bmp/iron studies/ferritin--possible Venofer-Dr.B All questions were answered. The patient knows to call the clinic with any problems, questions or concerns.    Cammie Sickle, MD 10/13/2020 1:54 PM

## 2020-10-13 NOTE — Assessment & Plan Note (Addendum)
#  Chronic mild anemia-hemoglobin 11.2; dec 2021--Iron sat-18%; s/p Venofer.  Proceed with Venofer today.  # ITP/ Thrombocytopenia-platelet count-129-STABLE; on surveillance.  # Elevated HTN- keep a log BPs; stable.  And defer to PCP, Dr.Hande.   #Eelevated creatinine- 1.5 [baseline ~1.0;Hx of kidney stones Dr.McDermott; Urology]- ?  History of multiple drug-resistant UTIs.  Previously used cephalexin 500 3 times daily [as per patient].  Await UA and culture.  Recommend avoiding NSAIDs.  # DISPOSITION: # UA & culture today # proceed with  Venofer today # follow up in 4 months/cbc/bmp/iron studies/ferritin--possible Venofer-Dr.B

## 2020-10-14 NOTE — Telephone Encounter (Signed)
Patient states that she was able to get an apt with Dr. Matilde Sprang in [place] on 10/28/20.   apts also mailed to patient.

## 2020-10-15 DIAGNOSIS — H2000 Unspecified acute and subacute iridocyclitis: Secondary | ICD-10-CM | POA: Diagnosis not present

## 2020-10-15 LAB — URINE CULTURE: Culture: <10000 — AB

## 2020-10-22 DIAGNOSIS — E113293 Type 2 diabetes mellitus with mild nonproliferative diabetic retinopathy without macular edema, bilateral: Secondary | ICD-10-CM | POA: Diagnosis not present

## 2020-10-22 DIAGNOSIS — H40013 Open angle with borderline findings, low risk, bilateral: Secondary | ICD-10-CM | POA: Diagnosis not present

## 2020-10-22 DIAGNOSIS — H04123 Dry eye syndrome of bilateral lacrimal glands: Secondary | ICD-10-CM | POA: Diagnosis not present

## 2020-10-22 DIAGNOSIS — Z961 Presence of intraocular lens: Secondary | ICD-10-CM | POA: Diagnosis not present

## 2020-10-22 DIAGNOSIS — H52223 Regular astigmatism, bilateral: Secondary | ICD-10-CM | POA: Diagnosis not present

## 2020-10-22 DIAGNOSIS — H1045 Other chronic allergic conjunctivitis: Secondary | ICD-10-CM | POA: Diagnosis not present

## 2020-10-22 DIAGNOSIS — H5203 Hypermetropia, bilateral: Secondary | ICD-10-CM | POA: Diagnosis not present

## 2020-10-22 DIAGNOSIS — H524 Presbyopia: Secondary | ICD-10-CM | POA: Diagnosis not present

## 2020-10-22 DIAGNOSIS — H02889 Meibomian gland dysfunction of unspecified eye, unspecified eyelid: Secondary | ICD-10-CM | POA: Diagnosis not present

## 2020-10-28 DIAGNOSIS — N201 Calculus of ureter: Secondary | ICD-10-CM | POA: Diagnosis not present

## 2020-10-28 DIAGNOSIS — R351 Nocturia: Secondary | ICD-10-CM | POA: Diagnosis not present

## 2020-10-29 DIAGNOSIS — E1165 Type 2 diabetes mellitus with hyperglycemia: Secondary | ICD-10-CM | POA: Diagnosis not present

## 2020-10-29 DIAGNOSIS — R5383 Other fatigue: Secondary | ICD-10-CM | POA: Diagnosis not present

## 2020-10-29 DIAGNOSIS — I10 Essential (primary) hypertension: Secondary | ICD-10-CM | POA: Diagnosis not present

## 2020-10-29 DIAGNOSIS — H539 Unspecified visual disturbance: Secondary | ICD-10-CM | POA: Diagnosis not present

## 2020-10-29 DIAGNOSIS — D509 Iron deficiency anemia, unspecified: Secondary | ICD-10-CM | POA: Diagnosis not present

## 2020-10-29 DIAGNOSIS — H9202 Otalgia, left ear: Secondary | ICD-10-CM | POA: Diagnosis not present

## 2020-10-29 DIAGNOSIS — G5793 Unspecified mononeuropathy of bilateral lower limbs: Secondary | ICD-10-CM | POA: Diagnosis not present

## 2020-10-29 DIAGNOSIS — D696 Thrombocytopenia, unspecified: Secondary | ICD-10-CM | POA: Diagnosis not present

## 2020-10-29 DIAGNOSIS — Z0000 Encounter for general adult medical examination without abnormal findings: Secondary | ICD-10-CM | POA: Diagnosis not present

## 2020-10-30 DIAGNOSIS — K5730 Diverticulosis of large intestine without perforation or abscess without bleeding: Secondary | ICD-10-CM | POA: Diagnosis not present

## 2020-10-30 DIAGNOSIS — N201 Calculus of ureter: Secondary | ICD-10-CM | POA: Diagnosis not present

## 2020-11-05 DIAGNOSIS — D649 Anemia, unspecified: Secondary | ICD-10-CM | POA: Diagnosis not present

## 2020-11-05 DIAGNOSIS — M8580 Other specified disorders of bone density and structure, unspecified site: Secondary | ICD-10-CM | POA: Diagnosis not present

## 2020-11-05 DIAGNOSIS — R351 Nocturia: Secondary | ICD-10-CM | POA: Diagnosis not present

## 2020-11-05 DIAGNOSIS — Z79899 Other long term (current) drug therapy: Secondary | ICD-10-CM | POA: Diagnosis not present

## 2020-11-05 DIAGNOSIS — F331 Major depressive disorder, recurrent, moderate: Secondary | ICD-10-CM | POA: Insufficient documentation

## 2020-11-05 DIAGNOSIS — R350 Frequency of micturition: Secondary | ICD-10-CM | POA: Diagnosis not present

## 2020-11-05 DIAGNOSIS — I10 Essential (primary) hypertension: Secondary | ICD-10-CM | POA: Diagnosis not present

## 2020-11-05 DIAGNOSIS — R5383 Other fatigue: Secondary | ICD-10-CM | POA: Diagnosis not present

## 2020-11-05 DIAGNOSIS — K582 Mixed irritable bowel syndrome: Secondary | ICD-10-CM | POA: Insufficient documentation

## 2020-11-05 DIAGNOSIS — Z85820 Personal history of malignant melanoma of skin: Secondary | ICD-10-CM | POA: Diagnosis not present

## 2020-11-05 DIAGNOSIS — K589 Irritable bowel syndrome without diarrhea: Secondary | ICD-10-CM | POA: Diagnosis not present

## 2020-11-05 DIAGNOSIS — E119 Type 2 diabetes mellitus without complications: Secondary | ICD-10-CM | POA: Diagnosis not present

## 2020-11-06 DIAGNOSIS — L738 Other specified follicular disorders: Secondary | ICD-10-CM | POA: Diagnosis not present

## 2020-11-06 DIAGNOSIS — Z85828 Personal history of other malignant neoplasm of skin: Secondary | ICD-10-CM | POA: Diagnosis not present

## 2020-11-06 DIAGNOSIS — L820 Inflamed seborrheic keratosis: Secondary | ICD-10-CM | POA: Diagnosis not present

## 2020-11-06 DIAGNOSIS — Z85820 Personal history of malignant melanoma of skin: Secondary | ICD-10-CM | POA: Diagnosis not present

## 2020-11-06 DIAGNOSIS — Z08 Encounter for follow-up examination after completed treatment for malignant neoplasm: Secondary | ICD-10-CM | POA: Diagnosis not present

## 2020-11-06 DIAGNOSIS — L298 Other pruritus: Secondary | ICD-10-CM | POA: Diagnosis not present

## 2020-11-24 DIAGNOSIS — M461 Sacroiliitis, not elsewhere classified: Secondary | ICD-10-CM | POA: Diagnosis not present

## 2020-11-24 DIAGNOSIS — M9904 Segmental and somatic dysfunction of sacral region: Secondary | ICD-10-CM | POA: Diagnosis not present

## 2020-11-24 DIAGNOSIS — R399 Unspecified symptoms and signs involving the genitourinary system: Secondary | ICD-10-CM | POA: Diagnosis not present

## 2020-11-24 DIAGNOSIS — R8290 Unspecified abnormal findings in urine: Secondary | ICD-10-CM | POA: Diagnosis not present

## 2020-11-24 DIAGNOSIS — M9903 Segmental and somatic dysfunction of lumbar region: Secondary | ICD-10-CM | POA: Diagnosis not present

## 2020-11-24 DIAGNOSIS — M5451 Vertebrogenic low back pain: Secondary | ICD-10-CM | POA: Diagnosis not present

## 2020-11-26 DIAGNOSIS — M9903 Segmental and somatic dysfunction of lumbar region: Secondary | ICD-10-CM | POA: Diagnosis not present

## 2020-11-26 DIAGNOSIS — M5451 Vertebrogenic low back pain: Secondary | ICD-10-CM | POA: Diagnosis not present

## 2020-11-26 DIAGNOSIS — M461 Sacroiliitis, not elsewhere classified: Secondary | ICD-10-CM | POA: Diagnosis not present

## 2020-11-26 DIAGNOSIS — M9904 Segmental and somatic dysfunction of sacral region: Secondary | ICD-10-CM | POA: Diagnosis not present

## 2020-11-30 DIAGNOSIS — M461 Sacroiliitis, not elsewhere classified: Secondary | ICD-10-CM | POA: Diagnosis not present

## 2020-11-30 DIAGNOSIS — M5451 Vertebrogenic low back pain: Secondary | ICD-10-CM | POA: Diagnosis not present

## 2020-11-30 DIAGNOSIS — M9903 Segmental and somatic dysfunction of lumbar region: Secondary | ICD-10-CM | POA: Diagnosis not present

## 2020-11-30 DIAGNOSIS — M9904 Segmental and somatic dysfunction of sacral region: Secondary | ICD-10-CM | POA: Diagnosis not present

## 2020-12-03 DIAGNOSIS — M9903 Segmental and somatic dysfunction of lumbar region: Secondary | ICD-10-CM | POA: Diagnosis not present

## 2020-12-03 DIAGNOSIS — M9904 Segmental and somatic dysfunction of sacral region: Secondary | ICD-10-CM | POA: Diagnosis not present

## 2020-12-03 DIAGNOSIS — M5451 Vertebrogenic low back pain: Secondary | ICD-10-CM | POA: Diagnosis not present

## 2020-12-03 DIAGNOSIS — M461 Sacroiliitis, not elsewhere classified: Secondary | ICD-10-CM | POA: Diagnosis not present

## 2020-12-08 ENCOUNTER — Other Ambulatory Visit: Payer: Self-pay | Admitting: Internal Medicine

## 2020-12-08 ENCOUNTER — Other Ambulatory Visit (HOSPITAL_COMMUNITY): Payer: Self-pay | Admitting: Internal Medicine

## 2020-12-08 DIAGNOSIS — M1611 Unilateral primary osteoarthritis, right hip: Secondary | ICD-10-CM | POA: Diagnosis not present

## 2020-12-08 DIAGNOSIS — R1031 Right lower quadrant pain: Secondary | ICD-10-CM | POA: Diagnosis not present

## 2020-12-08 DIAGNOSIS — R195 Other fecal abnormalities: Secondary | ICD-10-CM | POA: Diagnosis not present

## 2020-12-08 DIAGNOSIS — E1165 Type 2 diabetes mellitus with hyperglycemia: Secondary | ICD-10-CM | POA: Diagnosis not present

## 2020-12-08 DIAGNOSIS — Z79899 Other long term (current) drug therapy: Secondary | ICD-10-CM | POA: Diagnosis not present

## 2020-12-08 DIAGNOSIS — D649 Anemia, unspecified: Secondary | ICD-10-CM | POA: Diagnosis not present

## 2020-12-08 DIAGNOSIS — R1032 Left lower quadrant pain: Secondary | ICD-10-CM | POA: Diagnosis not present

## 2020-12-08 DIAGNOSIS — I10 Essential (primary) hypertension: Secondary | ICD-10-CM | POA: Diagnosis not present

## 2020-12-08 DIAGNOSIS — M7061 Trochanteric bursitis, right hip: Secondary | ICD-10-CM | POA: Diagnosis not present

## 2020-12-08 DIAGNOSIS — E119 Type 2 diabetes mellitus without complications: Secondary | ICD-10-CM | POA: Diagnosis not present

## 2020-12-08 DIAGNOSIS — M5416 Radiculopathy, lumbar region: Secondary | ICD-10-CM | POA: Diagnosis not present

## 2020-12-08 DIAGNOSIS — R10813 Right lower quadrant abdominal tenderness: Secondary | ICD-10-CM | POA: Diagnosis not present

## 2020-12-08 DIAGNOSIS — F331 Major depressive disorder, recurrent, moderate: Secondary | ICD-10-CM | POA: Diagnosis not present

## 2020-12-09 ENCOUNTER — Ambulatory Visit: Payer: Medicare HMO

## 2020-12-09 ENCOUNTER — Other Ambulatory Visit: Payer: Self-pay

## 2020-12-09 ENCOUNTER — Ambulatory Visit
Admission: RE | Admit: 2020-12-09 | Discharge: 2020-12-09 | Disposition: A | Discharge status: Home / Self Care | Payer: Medicare HMO | Source: Ambulatory Visit | Attending: Internal Medicine | Admitting: Internal Medicine

## 2020-12-09 DIAGNOSIS — R1031 Right lower quadrant pain: Secondary | ICD-10-CM | POA: Diagnosis not present

## 2020-12-09 DIAGNOSIS — I700 Atherosclerosis of aorta: Secondary | ICD-10-CM | POA: Diagnosis not present

## 2020-12-09 DIAGNOSIS — R195 Other fecal abnormalities: Secondary | ICD-10-CM | POA: Insufficient documentation

## 2020-12-09 DIAGNOSIS — K439 Ventral hernia without obstruction or gangrene: Secondary | ICD-10-CM | POA: Diagnosis not present

## 2020-12-09 DIAGNOSIS — K429 Umbilical hernia without obstruction or gangrene: Secondary | ICD-10-CM | POA: Diagnosis not present

## 2020-12-09 DIAGNOSIS — K5730 Diverticulosis of large intestine without perforation or abscess without bleeding: Secondary | ICD-10-CM | POA: Diagnosis not present

## 2020-12-09 DIAGNOSIS — R1032 Left lower quadrant pain: Secondary | ICD-10-CM | POA: Insufficient documentation

## 2020-12-11 ENCOUNTER — Telehealth: Payer: Self-pay | Admitting: *Deleted

## 2020-12-11 DIAGNOSIS — D509 Iron deficiency anemia, unspecified: Secondary | ICD-10-CM

## 2020-12-11 NOTE — Telephone Encounter (Signed)
Per dr. Rogue Bussing - please move her sep appts to sooner- next 1-2 weeks. thx

## 2020-12-11 NOTE — Telephone Encounter (Signed)
Per Maudie Mercury, RN "This patient would like to make an appointment to see Dr. B and to get some iron injections"

## 2020-12-11 NOTE — Telephone Encounter (Signed)
Dr. Jacinto Reap - are you ok with [place] setting pt up for lab/md/possible venofer.- with labs cbc, metb, ferr and iibc?

## 2020-12-14 DIAGNOSIS — M5451 Vertebrogenic low back pain: Secondary | ICD-10-CM | POA: Diagnosis not present

## 2020-12-14 DIAGNOSIS — M461 Sacroiliitis, not elsewhere classified: Secondary | ICD-10-CM | POA: Diagnosis not present

## 2020-12-14 DIAGNOSIS — M9904 Segmental and somatic dysfunction of sacral region: Secondary | ICD-10-CM | POA: Diagnosis not present

## 2020-12-14 DIAGNOSIS — M9903 Segmental and somatic dysfunction of lumbar region: Secondary | ICD-10-CM | POA: Diagnosis not present

## 2020-12-17 ENCOUNTER — Inpatient Hospital Stay: Payer: Medicare HMO | Attending: Internal Medicine

## 2020-12-17 DIAGNOSIS — D696 Thrombocytopenia, unspecified: Secondary | ICD-10-CM | POA: Insufficient documentation

## 2020-12-17 DIAGNOSIS — K5730 Diverticulosis of large intestine without perforation or abscess without bleeding: Secondary | ICD-10-CM | POA: Diagnosis not present

## 2020-12-17 DIAGNOSIS — R109 Unspecified abdominal pain: Secondary | ICD-10-CM | POA: Insufficient documentation

## 2020-12-17 DIAGNOSIS — D509 Iron deficiency anemia, unspecified: Secondary | ICD-10-CM | POA: Diagnosis not present

## 2020-12-17 DIAGNOSIS — K5790 Diverticulosis of intestine, part unspecified, without perforation or abscess without bleeding: Secondary | ICD-10-CM | POA: Diagnosis not present

## 2020-12-17 DIAGNOSIS — Z79899 Other long term (current) drug therapy: Secondary | ICD-10-CM | POA: Diagnosis not present

## 2020-12-17 DIAGNOSIS — H2000 Unspecified acute and subacute iridocyclitis: Secondary | ICD-10-CM | POA: Diagnosis not present

## 2020-12-17 DIAGNOSIS — R197 Diarrhea, unspecified: Secondary | ICD-10-CM | POA: Insufficient documentation

## 2020-12-17 LAB — IRON AND TIBC
Iron: 63 ug/dL (ref 28–170)
Saturation Ratios: 15 % (ref 10.4–31.8)
TIBC: 410 ug/dL (ref 250–450)
UIBC: 347 ug/dL

## 2020-12-17 LAB — CBC WITH DIFFERENTIAL/PLATELET
Abs Immature Granulocytes: 0.04 10*3/uL (ref 0.00–0.07)
Basophils Absolute: 0 10*3/uL (ref 0.0–0.1)
Basophils Relative: 1 %
Eosinophils Absolute: 0.2 10*3/uL (ref 0.0–0.5)
Eosinophils Relative: 3 %
HCT: 31.9 % — ABNORMAL LOW (ref 36.0–46.0)
Hemoglobin: 10.8 g/dL — ABNORMAL LOW (ref 12.0–15.0)
Immature Granulocytes: 1 %
Lymphocytes Relative: 16 %
Lymphs Abs: 0.8 10*3/uL (ref 0.7–4.0)
MCH: 32.1 pg (ref 26.0–34.0)
MCHC: 33.9 g/dL (ref 30.0–36.0)
MCV: 94.9 fL (ref 80.0–100.0)
Monocytes Absolute: 0.3 10*3/uL (ref 0.1–1.0)
Monocytes Relative: 7 %
Neutro Abs: 3.7 10*3/uL (ref 1.7–7.7)
Neutrophils Relative %: 72 %
Platelets: 128 10*3/uL — ABNORMAL LOW (ref 150–400)
RBC: 3.36 MIL/uL — ABNORMAL LOW (ref 3.87–5.11)
RDW: 12.9 % (ref 11.5–15.5)
WBC: 5.1 10*3/uL (ref 4.0–10.5)
nRBC: 0 % (ref 0.0–0.2)

## 2020-12-17 LAB — BASIC METABOLIC PANEL
Anion gap: 10 (ref 5–15)
BUN: 26 mg/dL — ABNORMAL HIGH (ref 8–23)
CO2: 26 mmol/L (ref 22–32)
Calcium: 9.5 mg/dL (ref 8.9–10.3)
Chloride: 104 mmol/L (ref 98–111)
Creatinine, Ser: 0.91 mg/dL (ref 0.44–1.00)
GFR, Estimated: >60 mL/min (ref >60)
Glucose, Bld: 274 mg/dL — ABNORMAL HIGH (ref 70–99)
Potassium: 4.6 mmol/L (ref 3.5–5.1)
Sodium: 140 mmol/L (ref 135–145)

## 2020-12-17 LAB — FERRITIN: Ferritin: 328 ng/mL — ABNORMAL HIGH (ref 11–307)

## 2020-12-18 ENCOUNTER — Inpatient Hospital Stay: Payer: Medicare HMO

## 2020-12-21 ENCOUNTER — Inpatient Hospital Stay: Payer: Medicare HMO

## 2020-12-21 ENCOUNTER — Inpatient Hospital Stay (HOSPITAL_BASED_OUTPATIENT_CLINIC_OR_DEPARTMENT_OTHER): Payer: Medicare HMO | Admitting: Oncology

## 2020-12-21 ENCOUNTER — Encounter: Payer: Self-pay | Admitting: Oncology

## 2020-12-21 VITALS — BP 172/76 | HR 80 | Temp 98.2°F | Resp 17 | Wt 149.0 lb

## 2020-12-21 DIAGNOSIS — D509 Iron deficiency anemia, unspecified: Secondary | ICD-10-CM | POA: Diagnosis not present

## 2020-12-21 DIAGNOSIS — R197 Diarrhea, unspecified: Secondary | ICD-10-CM

## 2020-12-21 DIAGNOSIS — D696 Thrombocytopenia, unspecified: Secondary | ICD-10-CM

## 2020-12-21 DIAGNOSIS — K5730 Diverticulosis of large intestine without perforation or abscess without bleeding: Secondary | ICD-10-CM | POA: Diagnosis not present

## 2020-12-21 DIAGNOSIS — R109 Unspecified abdominal pain: Secondary | ICD-10-CM | POA: Diagnosis not present

## 2020-12-21 DIAGNOSIS — K5790 Diverticulosis of intestine, part unspecified, without perforation or abscess without bleeding: Secondary | ICD-10-CM | POA: Diagnosis not present

## 2020-12-21 DIAGNOSIS — Z79899 Other long term (current) drug therapy: Secondary | ICD-10-CM | POA: Diagnosis not present

## 2020-12-21 MED ORDER — IRON SUCROSE 20 MG/ML IV SOLN
200.0000 mg | Freq: Once | INTRAVENOUS | Status: AC
  Administered 2020-12-21: 200 mg via INTRAVENOUS
  Filled 2020-12-21: qty 10

## 2020-12-21 MED ORDER — SODIUM CHLORIDE 0.9 % IV SOLN
INTRAVENOUS | Status: DC | PRN
  Filled 2020-12-21: qty 250

## 2020-12-21 NOTE — Progress Notes (Signed)
[facility] CONSULT NOTE  Patient Care Team: Tracie Harrier, MD as PCP - General (Internal Medicine) Tracie Harrier, MD as Physician Assistant (Internal Medicine) Lequita Asal, MD as Consulting Physician (Hematology and Oncology)  CHIEF COMPLAINTS/PURPOSE OF CONSULTATION: Iron deficiency anemia/thrombocytopenia  #Chronic [2015] mild anemia hemoglobin 11.1-question iron deficiency versus others.-Dr. Elliott/Dr. Mike Gip; s/p Venofer.   #Mild thrombocytopenia platelets 120s  #Fatigue/ PN/DM; MELANOMA of mid back [May 2021] s/p excision.   Oncology History   No history exists.    HISTORY OF PRESENTING ILLNESS:  Kim Ballard 86 y.o.  female longstanding history of iron deficient anemia/thrombocytopenia is here for follow-up.  She was last seen in clinic on 10/13/2020.  She last received IV Venofer on 10/13/2020.  In the interim, she reports doing fairly well.  More recently reports worsening fatigue which prompted her visit.  She is also having significant abdominal discomfort especially on her left side.  She has been seen by her PCP who sent her for imaging which showed colonic diverticulosis without evidence of diverticulitis.  She was treated for diverticulitis with oral antibiotics with some improvement of her symptoms.  Reports loose stools over the past few months.  She intermittently uses Librax for abdominal pain.  Reports decrease in her appetite and feels her taste is off.  She has not seen GI in quite some time.  Reports daily loose stools but denies any fevers.  Review of Systems  Constitutional:  Positive for malaise/fatigue. Negative for chills, diaphoresis, fever and weight loss.  HENT:  Negative for nosebleeds and sore throat.   Eyes:  Negative for double vision.  Respiratory:  Negative for cough, hemoptysis, sputum production, shortness of breath and wheezing.   Cardiovascular:  Negative for chest pain, palpitations, orthopnea and leg swelling.   Gastrointestinal:  Positive for abdominal pain and diarrhea. Negative for blood in stool, constipation, heartburn, melena, nausea and vomiting.  Genitourinary:  Negative for dysuria, flank pain, frequency and urgency.  Musculoskeletal:  Positive for back pain. Negative for joint pain.  Skin: Negative.  Negative for itching and rash.  Neurological:  Positive for tingling and weakness. Negative for dizziness, focal weakness and headaches.  Endo/Heme/Allergies:  Does not bruise/bleed easily.  Psychiatric/Behavioral:  Negative for depression. The patient is not nervous/anxious and does not have insomnia.     MEDICAL HISTORY:  Past Medical History:  Diagnosis Date   Allergic state    Anemia    Arthritis    Cancer ([place])    skin cancers   Chronic cystitis with hematuria    Depression    Diabetes mellitus without complication ([place])    Fibrocystic disease of both breasts    Fibromyalgia    GERD (gastroesophageal reflux disease)    Hypertension    IBS (irritable bowel syndrome)    Nausea    Skin cancer    Wears dentures    full upper and lower    SURGICAL HISTORY: Past Surgical History:  Procedure Laterality Date   ABDOMINAL HYSTERECTOMY     APPENDECTOMY     BREAST BIOPSY Right 2001   surgical bx    CATARACT EXTRACTION W/PHACO Right 03/08/2017   Procedure: CATARACT EXTRACTION PHACO AND INTRAOCULAR LENS PLACEMENT ([place]) RIGHT DIABETIC TORIC;  Surgeon: Leandrew Koyanagi, MD;  Location: [facility];  Service: Ophthalmology;  Laterality: Right;  Diabetic - oral meds   CATARACT EXTRACTION W/PHACO Left 04/17/2017   Procedure: CATARACT EXTRACTION PHACO AND INTRAOCULAR LENS PLACEMENT ([place]) LEFT DIABETIC;  Surgeon: Leandrew Koyanagi,  MD;  Location: [facility];  Service: Ophthalmology;  Laterality: Left;  diabetic-oral meds   CHOLECYSTECTOMY     COLONOSCOPY     COLONOSCOPY WITH PROPOFOL N/A 08/27/2015   Procedure: COLONOSCOPY WITH PROPOFOL;  Surgeon: Manya Silvas,  MD;  Location: [facility] ENDOSCOPY;  Service: Endoscopy;  Laterality: N/A;   COLONOSCOPY WITH PROPOFOL N/A 05/02/2016   Procedure: COLONOSCOPY WITH PROPOFOL;  Surgeon: Manya Silvas, MD;  Location: [facility] ENDOSCOPY;  Service: Endoscopy;  Laterality: N/A;   ESOPHAGOGASTRODUODENOSCOPY     ESOPHAGOGASTRODUODENOSCOPY (EGD) WITH PROPOFOL N/A 08/27/2015   Procedure: ESOPHAGOGASTRODUODENOSCOPY (EGD) WITH PROPOFOL;  Surgeon: Manya Silvas, MD;  Location: [facility] ENDOSCOPY;  Service: Endoscopy;  Laterality: N/A;   HERNIA REPAIR      SOCIAL HISTORY: Social History   Socioeconomic History   Marital status: Married    Spouse name: Not on file   Number of children: Not on file   Years of education: Not on file   Highest education level: Not on file  Occupational History   Not on file  Tobacco Use   Smoking status: Former    Types: Cigarettes    Quit date: 37    Years since quitting: 62.5   Smokeless tobacco: Never  Vaping Use   Vaping Use: Never used  Substance and Sexual Activity   Alcohol use: No   Drug use: No   Sexual activity: Not Currently  Other Topics Concern   Not on file  Social History Narrative   Not on file   Social Determinants of Health   Financial Resource Strain: Not on file  Food Insecurity: Not on file  Transportation Needs: Not on file  Physical Activity: Not on file  Stress: Not on file  Social Connections: Not on file  Intimate Partner Violence: Not on file    FAMILY HISTORY: Family History  Problem Relation Age of Onset   Hypertension Mother    Ovarian cancer Paternal Grandmother    COPD Father    Hyperlipidemia Father    Kidney disease Father    Diabetes Maternal Grandmother    Breast cancer Neg Hx     ALLERGIES:  is allergic to biaxin [clarithromycin], brompheniramine maleate, ceclor [cefaclor], ciprofloxacin, clinoril [sulindac], codeine sulfate, diovan [valsartan], doxycycline, erythromycin, esomeprazole, fluoxetine, guaifenesin & derivatives,  levaquin [levofloxacin in d5w], lisinopril, lodine [etodolac], macrodantin [nitrofurantoin macrocrystal], medrol [methylprednisolone], mobic [meloxicam], motrin [ibuprofen], nexium [esomeprazole magnesium], nitrofurantoin, norvasc [amlodipine besylate], omnicef [cefdinir], penicillins, prozac [fluoxetine hcl], pseudoephedrine, reglan [metoclopramide], seldane [terfenadine], sulfa antibiotics, toprol xl [metoprolol tartrate], triamterene, tussionex pennkinetic er [employer] polst-cpm polst er], and vibramycin [doxycycline calcium].  MEDICATIONS:  Current Outpatient Medications  Medication Sig Dispense Refill   Ascorbic Acid (VITAMIN C) 1000 MG tablet Take 1,000 mg by mouth 2 times daily at 12 noon and 4 pm.     cholecalciferol (VITAMIN D3) 25 MCG (1000 UT) tablet Take 1,000 Units by mouth 2 (two) times daily.     clidinium-chlordiazePOXIDE (LIBRAX) 5-2.5 MG capsule Take 1 capsule by mouth.     Cranberry 405 MG CAPS Take 2 capsules by mouth 2 (two) times daily.      FERREX 150 150 MG capsule      fluticasone (FLONASE) 50 MCG/ACT nasal spray Place into both nostrils daily.     gemfibrozil (LOPID) 600 MG tablet Take 600 mg by mouth 2 (two) times daily before a meal.     glipiZIDE (GLUCOTROL XL) 5 MG 24 hr tablet Take 1 tablet by mouth daily.  hydrochlorothiazide (HYDRODIURIL) 25 MG tablet Take 25 mg by mouth daily.     Lactobacillus Rhamnosus, GG, ([employer] PROBIOTIC, LACTOBACILLUS, PO) Take by mouth.     metFORMIN (GLUCOPHAGE) 500 MG tablet Take 1,000 mg by mouth 2 (two) times daily with a meal.      Multiple Vitamins-Minerals (MULTIVITAMIN WITH MINERALS) tablet Take 1 tablet by mouth daily.     OMEGA-3 FATTY ACIDS PO Take by mouth.     pantoprazole (PROTONIX) 40 MG tablet Take 40 mg by mouth daily.     vitamin B-12 (CYANOCOBALAMIN) 1000 MCG tablet Take 1,000 mcg by mouth daily.     vitamin E 100 UNIT capsule Take by mouth daily.     gabapentin (NEURONTIN) 100 MG capsule Take 1 capsule by mouth daily.  (Patient not taking: No sig reported)     No current facility-administered medications for this visit.   Facility-Administered Medications Ordered in Other Visits  Medication Dose Route Frequency Provider Last Rate Last Admin   0.9 %  sodium chloride infusion   Intravenous PRN Cammie Sickle, MD 20 mL/hr at 12/21/20 1401 New Bag at 12/21/20 1401   iron sucrose (VENOFER) injection 200 mg  200 mg Intravenous Once Lequita Asal, MD          .  PHYSICAL EXAMINATION: ECOG PERFORMANCE STATUS: 0 - Asymptomatic  Vitals:   12/21/20 1311 12/21/20 1314  BP:  (!) 172/76  Pulse: 80   Resp: 17   Temp: 98.2 F (36.8 C)   SpO2: 100%    Filed Weights   12/21/20 1311  Weight: 149 lb (67.6 kg)    Physical Exam Constitutional:      Comments: Patient is alone.  Ambulating dependently.  HENT:     Head: Normocephalic and atraumatic.     Mouth/Throat:     Pharynx: No oropharyngeal exudate.  Eyes:     Pupils: Pupils are equal, round, and reactive to light.  Cardiovascular:     Rate and Rhythm: Normal rate and regular rhythm.  Pulmonary:     Effort: Pulmonary effort is normal. No respiratory distress.     Breath sounds: Normal breath sounds. No wheezing.  Abdominal:     General: Bowel sounds are normal. There is no distension.     Palpations: Abdomen is soft. There is no mass.     Tenderness: There is no abdominal tenderness. There is no guarding or rebound.  Musculoskeletal:        General: No tenderness. Normal range of motion.     Cervical back: Normal range of motion and neck supple.  Skin:    General: Skin is warm.  Neurological:     Mental Status: She is alert and oriented to person, place, and time.  Psychiatric:        Mood and Affect: Affect normal.   LABORATORY DATA:  I have reviewed the data as listed Lab Results  Component Value Date   WBC 5.1 12/17/2020   HGB 10.8 (L) 12/17/2020   HCT 31.9 (L) 12/17/2020   MCV 94.9 12/17/2020   PLT 128 (L) 12/17/2020    Recent Labs    02/14/20 1318 05/25/20 1113 06/15/20 1243 10/13/20 1305 12/17/20 1325  NA 142   < > 141 140 140  K 3.9   < > 4.5 4.7 4.6  CL 108   < > 104 103 104  CO2 21*   < > 27 25 26   GLUCOSE 135*   < > 218* 180* 274*  BUN 30*   < > 28* 36* 26*  CREATININE 0.84   < > 0.74 1.51* 0.91  CALCIUM 9.5   < > 9.5 9.8 9.5  GFRNONAA >60   < > >60 34* >60  GFRAA >60  --   --   --   --    < > = values in this interval not displayed.    RADIOGRAPHIC STUDIES: I have personally reviewed the radiological images as listed and agreed with the findings in the report. CT ABDOMEN PELVIS WO CONTRAST  Result Date: 12/09/2020 CLINICAL DATA:  Bilateral lower abdominal pain and abdominal distension. Right side hurting worse for 2 weeks. Changes to bowel movements. Feels like she is 9 months pregnant. EXAM: CT ABDOMEN AND PELVIS WITHOUT CONTRAST TECHNIQUE: Multidetector CT imaging of the abdomen and pelvis was performed following the standard protocol without IV contrast. COMPARISON:  None. FINDINGS: Lower chest: No acute abnormality. Calcified ring surrounds the gastroesophageal junction similar to prior exam. Hepatobiliary: No focal liver abnormality. Status post cholecystectomy. No biliary dilatation. Pancreas: No focal lesion. Normal pancreatic contour. No surrounding inflammatory changes. No main pancreatic ductal dilatation. Spleen: Normal in size without focal abnormality. Adrenals/Urinary Tract: No adrenal nodule bilaterally. No nephrolithiasis, no hydronephrosis, and no contour-deforming renal mass. No ureterolithiasis or hydroureter. The urinary bladder is unremarkable. Stomach/Bowel: Stomach is within normal limits. No evidence of bowel wall thickening or dilatation. Diffuse sigmoid diverticulosis. Otherwise scattered colonic diverticulosis. The appendix not definitely identified and may be surgically absent Vascular/Lymphatic: No abdominal aorta or iliac aneurysm. At least moderate atherosclerotic  plaque of the aorta and its branches. No abdominal, pelvic, or inguinal lymphadenopathy. Reproductive: Status post hysterectomy. No adnexal masses. Other: No intraperitoneal free fluid. No intraperitoneal free gas. No organized fluid collection. Musculoskeletal: Tiny fat containing supraumbilical ventral wall hernia (6:76). Tiny fat containing umbilical. No suspicious lytic or blastic osseous lesions. No acute displaced fracture. Multilevel degenerative changes of the spine. IMPRESSION: 1. Colonic diverticulosis with no acute diverticulitis. 2. Calcified ring surrounds the gastroesophageal junction similar to prior exam. Per history, related to hiatal hernia repair. 3. Otherwise no acute intra-abdominal or intrapelvic abnormality. Electronically Signed   By: Iven Finn M.D.   On: 12/09/2020 22:00    ASSESSMENT & PLAN:   Chronic mild anemia- Receives intermittent IV Venofer last given on 10/13/2020. Labs from 12/17/2020 show hemoglobin of 10.8 (11.1), ferritin 328 and iron saturations 15%. Denies any bleeding. Proceed with IV Venofer x2 doses.   ITP/ Thrombocytopenia- Stable.  Platelet count from 12/17/2020 is 128,000.   No bleeding per patient.  Abdominal pain- Reports diarrhea daily for the past few months. Worsening left-sided abdominal pain. Had CT abdomen on 12/09/2020 which showed colonic diverticulosis without evidence of diverticulitis. She was treated with doxycycline given symptoms. Uses Librax for pain. Would like to collect stool samples if possible.  Containers provided to patient and she is to bring them back when collected. Recommend follow-up with GI ASAP.  Previously saw Dr. Daisey Must.   DISPOSITION: Proceed with IV Venofer today x2 doses. Return stool sample when able. Recommend GI follow-up ASAP-Dr. Daisey Must RTC in 3 months for repeat labs (CBC, ferritin and iron), MD assessment and possible IV Venofer.  Greater than 50% was spent in counseling and coordination of care with  this patient including but not limited to discussion of the relevant topics above (See A&P) including, but not limited to diagnosis and management of acute and chronic medical conditions.   No problem-specific Assessment & Plan notes found  for this encounter. All questions were answered. The patient knows to call the clinic with any problems, questions or concerns.    Jacquelin Hawking, NP 12/21/2020 3:38 PM

## 2020-12-21 NOTE — Progress Notes (Signed)
Patient here for oncology follow-up appointment, concerns of fatigue and diarrhea

## 2020-12-22 ENCOUNTER — Other Ambulatory Visit: Payer: Self-pay | Admitting: Oncology

## 2020-12-22 ENCOUNTER — Other Ambulatory Visit: Payer: Self-pay

## 2020-12-22 DIAGNOSIS — D696 Thrombocytopenia, unspecified: Secondary | ICD-10-CM | POA: Diagnosis not present

## 2020-12-22 DIAGNOSIS — R197 Diarrhea, unspecified: Secondary | ICD-10-CM

## 2020-12-22 DIAGNOSIS — K5790 Diverticulosis of intestine, part unspecified, without perforation or abscess without bleeding: Secondary | ICD-10-CM | POA: Diagnosis not present

## 2020-12-22 DIAGNOSIS — R109 Unspecified abdominal pain: Secondary | ICD-10-CM

## 2020-12-22 DIAGNOSIS — Z79899 Other long term (current) drug therapy: Secondary | ICD-10-CM | POA: Diagnosis not present

## 2020-12-22 DIAGNOSIS — D509 Iron deficiency anemia, unspecified: Secondary | ICD-10-CM | POA: Diagnosis not present

## 2020-12-22 DIAGNOSIS — K5730 Diverticulosis of large intestine without perforation or abscess without bleeding: Secondary | ICD-10-CM | POA: Diagnosis not present

## 2020-12-22 LAB — GASTROINTESTINAL PANEL BY PCR, STOOL (REPLACES STOOL CULTURE)

## 2020-12-22 LAB — C DIFFICILE QUICK SCREEN W PCR REFLEX
C Diff antigen: NEGATIVE
C Diff interpretation: NOT DETECTED
C Diff toxin: NEGATIVE

## 2020-12-24 ENCOUNTER — Inpatient Hospital Stay: Payer: Medicare HMO

## 2020-12-24 ENCOUNTER — Inpatient Hospital Stay: Payer: Medicare HMO | Admitting: Internal Medicine

## 2020-12-30 ENCOUNTER — Other Ambulatory Visit: Payer: Self-pay | Admitting: Orthopedic Surgery

## 2020-12-30 ENCOUNTER — Other Ambulatory Visit (HOSPITAL_COMMUNITY): Payer: Self-pay | Admitting: Orthopedic Surgery

## 2020-12-30 DIAGNOSIS — M545 Low back pain, unspecified: Secondary | ICD-10-CM

## 2020-12-30 DIAGNOSIS — M5489 Other dorsalgia: Secondary | ICD-10-CM | POA: Diagnosis not present

## 2020-12-30 DIAGNOSIS — S32030A Wedge compression fracture of third lumbar vertebra, initial encounter for closed fracture: Secondary | ICD-10-CM | POA: Diagnosis not present

## 2020-12-30 DIAGNOSIS — M47816 Spondylosis without myelopathy or radiculopathy, lumbar region: Secondary | ICD-10-CM | POA: Diagnosis not present

## 2020-12-30 DIAGNOSIS — M1612 Unilateral primary osteoarthritis, left hip: Secondary | ICD-10-CM | POA: Diagnosis not present

## 2021-01-03 ENCOUNTER — Ambulatory Visit
Admission: RE | Admit: 2021-01-03 | Discharge: 2021-01-03 | Disposition: A | Discharge status: Home / Self Care | Payer: Medicare HMO | Source: Ambulatory Visit | Attending: Orthopedic Surgery | Admitting: Orthopedic Surgery

## 2021-01-03 DIAGNOSIS — M545 Low back pain, unspecified: Secondary | ICD-10-CM | POA: Insufficient documentation

## 2021-01-04 ENCOUNTER — Inpatient Hospital Stay: Payer: Medicare HMO | Attending: Internal Medicine

## 2021-01-04 DIAGNOSIS — D509 Iron deficiency anemia, unspecified: Secondary | ICD-10-CM | POA: Insufficient documentation

## 2021-01-12 ENCOUNTER — Inpatient Hospital Stay: Payer: Medicare HMO

## 2021-01-12 VITALS — BP 171/77 | HR 69 | Temp 96.4°F | Resp 16

## 2021-01-12 DIAGNOSIS — D509 Iron deficiency anemia, unspecified: Secondary | ICD-10-CM | POA: Diagnosis not present

## 2021-01-12 MED ORDER — IRON SUCROSE 20 MG/ML IV SOLN
200.0000 mg | Freq: Once | INTRAVENOUS | Status: AC
  Administered 2021-01-12: 200 mg via INTRAVENOUS
  Filled 2021-01-12: qty 10

## 2021-01-12 MED ORDER — SODIUM CHLORIDE 0.9 % IV SOLN
Freq: Once | INTRAVENOUS | Status: AC
  Filled 2021-01-12: qty 250

## 2021-01-12 NOTE — Progress Notes (Signed)
Initial BP 198/89, Patient states she is asymptomatic. Dr. Rogue Bussing and team made aware. Rechecked BP after 15 minutes, BP 182/79. Per Jonne Ply., RN per Dr. Rogue Bussing, okay to proceed with Venofer treatment. Billey Chang, NP at chairside to assess patient.   Final BP 171/77, patient remains asymptomatic. Patient educated on s/s as to when to seek emergency care. Encouraged to monitor BP at home. Patient verbalizes understanding and denies any further questions or concerns.

## 2021-01-18 DIAGNOSIS — S32030D Wedge compression fracture of third lumbar vertebra, subsequent encounter for fracture with routine healing: Secondary | ICD-10-CM | POA: Diagnosis not present

## 2021-01-18 DIAGNOSIS — M9973 Connective tissue and disc stenosis of intervertebral foramina of lumbar region: Secondary | ICD-10-CM | POA: Diagnosis not present

## 2021-01-22 DIAGNOSIS — D75839 Thrombocytosis, unspecified: Secondary | ICD-10-CM | POA: Diagnosis not present

## 2021-01-22 DIAGNOSIS — M5416 Radiculopathy, lumbar region: Secondary | ICD-10-CM | POA: Diagnosis not present

## 2021-01-22 DIAGNOSIS — M5136 Other intervertebral disc degeneration, lumbar region: Secondary | ICD-10-CM | POA: Diagnosis not present

## 2021-01-25 DIAGNOSIS — R399 Unspecified symptoms and signs involving the genitourinary system: Secondary | ICD-10-CM | POA: Diagnosis not present

## 2021-01-25 DIAGNOSIS — R8290 Unspecified abnormal findings in urine: Secondary | ICD-10-CM | POA: Diagnosis not present

## 2021-02-07 DIAGNOSIS — U071 COVID-19: Secondary | ICD-10-CM | POA: Diagnosis not present

## 2021-02-17 ENCOUNTER — Ambulatory Visit: Payer: Medicare HMO | Admitting: Internal Medicine

## 2021-02-17 ENCOUNTER — Ambulatory Visit: Payer: Medicare HMO

## 2021-02-17 ENCOUNTER — Other Ambulatory Visit: Payer: Medicare HMO

## 2021-02-18 DIAGNOSIS — I10 Essential (primary) hypertension: Secondary | ICD-10-CM | POA: Diagnosis not present

## 2021-02-18 DIAGNOSIS — F331 Major depressive disorder, recurrent, moderate: Secondary | ICD-10-CM | POA: Diagnosis not present

## 2021-02-18 DIAGNOSIS — M8580 Other specified disorders of bone density and structure, unspecified site: Secondary | ICD-10-CM | POA: Diagnosis not present

## 2021-02-18 DIAGNOSIS — D696 Thrombocytopenia, unspecified: Secondary | ICD-10-CM | POA: Diagnosis not present

## 2021-02-18 DIAGNOSIS — C4359 Malignant melanoma of other part of trunk: Secondary | ICD-10-CM | POA: Diagnosis not present

## 2021-02-18 DIAGNOSIS — R5381 Other malaise: Secondary | ICD-10-CM | POA: Diagnosis not present

## 2021-02-18 DIAGNOSIS — D649 Anemia, unspecified: Secondary | ICD-10-CM | POA: Diagnosis not present

## 2021-02-18 DIAGNOSIS — K582 Mixed irritable bowel syndrome: Secondary | ICD-10-CM | POA: Diagnosis not present

## 2021-02-18 DIAGNOSIS — E1165 Type 2 diabetes mellitus with hyperglycemia: Secondary | ICD-10-CM | POA: Diagnosis not present

## 2021-02-25 DIAGNOSIS — R109 Unspecified abdominal pain: Secondary | ICD-10-CM | POA: Diagnosis not present

## 2021-02-25 DIAGNOSIS — U071 COVID-19: Secondary | ICD-10-CM | POA: Diagnosis not present

## 2021-02-25 DIAGNOSIS — J208 Acute bronchitis due to other specified organisms: Secondary | ICD-10-CM | POA: Diagnosis not present

## 2021-02-25 DIAGNOSIS — E1140 Type 2 diabetes mellitus with diabetic neuropathy, unspecified: Secondary | ICD-10-CM | POA: Diagnosis not present

## 2021-02-25 DIAGNOSIS — C4359 Malignant melanoma of other part of trunk: Secondary | ICD-10-CM | POA: Diagnosis not present

## 2021-02-25 DIAGNOSIS — I10 Essential (primary) hypertension: Secondary | ICD-10-CM | POA: Diagnosis not present

## 2021-03-22 ENCOUNTER — Other Ambulatory Visit: Payer: Self-pay

## 2021-03-22 ENCOUNTER — Inpatient Hospital Stay: Payer: Medicare HMO

## 2021-03-22 ENCOUNTER — Inpatient Hospital Stay: Payer: Medicare HMO | Attending: Internal Medicine

## 2021-03-22 ENCOUNTER — Encounter: Payer: Self-pay | Admitting: Internal Medicine

## 2021-03-22 ENCOUNTER — Inpatient Hospital Stay (HOSPITAL_BASED_OUTPATIENT_CLINIC_OR_DEPARTMENT_OTHER): Payer: Medicare HMO | Admitting: Internal Medicine

## 2021-03-22 VITALS — BP 182/84

## 2021-03-22 VITALS — BP 187/88 | HR 74 | Temp 97.3°F | Resp 20 | Ht 67.0 in | Wt 149.0 lb

## 2021-03-22 DIAGNOSIS — D509 Iron deficiency anemia, unspecified: Secondary | ICD-10-CM

## 2021-03-22 DIAGNOSIS — E611 Iron deficiency: Secondary | ICD-10-CM

## 2021-03-22 LAB — BASIC METABOLIC PANEL
Anion gap: 10 (ref 5–15)
BUN: 33 mg/dL — ABNORMAL HIGH (ref 8–23)
CO2: 24 mmol/L (ref 22–32)
Calcium: 9.4 mg/dL (ref 8.9–10.3)
Chloride: 104 mmol/L (ref 98–111)
Creatinine, Ser: 1.02 mg/dL — ABNORMAL HIGH (ref 0.44–1.00)
GFR, Estimated: 55 mL/min — ABNORMAL LOW (ref >60)
Glucose, Bld: 218 mg/dL — ABNORMAL HIGH (ref 70–99)
Potassium: 4.4 mmol/L (ref 3.5–5.1)
Sodium: 138 mmol/L (ref 135–145)

## 2021-03-22 LAB — CBC WITH DIFFERENTIAL/PLATELET
Abs Immature Granulocytes: 0.03 10*3/uL (ref 0.00–0.07)
Basophils Absolute: 0 10*3/uL (ref 0.0–0.1)
Basophils Relative: 1 %
Eosinophils Absolute: 0.2 10*3/uL (ref 0.0–0.5)
Eosinophils Relative: 5 %
HCT: 29.1 % — ABNORMAL LOW (ref 36.0–46.0)
Hemoglobin: 10.2 g/dL — ABNORMAL LOW (ref 12.0–15.0)
Immature Granulocytes: 1 %
Lymphocytes Relative: 22 %
Lymphs Abs: 0.8 10*3/uL (ref 0.7–4.0)
MCH: 33.6 pg (ref 26.0–34.0)
MCHC: 35.1 g/dL (ref 30.0–36.0)
MCV: 95.7 fL (ref 80.0–100.0)
Monocytes Absolute: 0.3 10*3/uL (ref 0.1–1.0)
Monocytes Relative: 9 %
Neutro Abs: 2.2 10*3/uL (ref 1.7–7.7)
Neutrophils Relative %: 62 %
Platelets: 132 10*3/uL — ABNORMAL LOW (ref 150–400)
RBC: 3.04 MIL/uL — ABNORMAL LOW (ref 3.87–5.11)
RDW: 13.2 % (ref 11.5–15.5)
WBC: 3.5 10*3/uL — ABNORMAL LOW (ref 4.0–10.5)
nRBC: 0 % (ref 0.0–0.2)

## 2021-03-22 LAB — IRON AND TIBC
Iron: 105 ug/dL (ref 28–170)
Saturation Ratios: 27 % (ref 10.4–31.8)
TIBC: 392 ug/dL (ref 250–450)
UIBC: 287 ug/dL

## 2021-03-22 LAB — FERRITIN: Ferritin: 409 ng/mL — ABNORMAL HIGH (ref 11–307)

## 2021-03-22 MED ORDER — SODIUM CHLORIDE 0.9 % IV SOLN
INTRAVENOUS | Status: DC
  Filled 2021-03-22: qty 250

## 2021-03-22 MED ORDER — IRON SUCROSE 20 MG/ML IV SOLN
200.0000 mg | Freq: Once | INTRAVENOUS | Status: AC
  Administered 2021-03-22: 200 mg via INTRAVENOUS
  Filled 2021-03-22: qty 10

## 2021-03-22 NOTE — Progress Notes (Signed)
[facility] CONSULT NOTE  Patient Care Team: Tracie Harrier, MD as PCP - General (Internal Medicine) Tracie Harrier, MD as Physician Assistant (Internal Medicine) Lequita Asal, MD (Inactive) as Consulting Physician (Hematology and Oncology)  CHIEF COMPLAINTS/PURPOSE OF CONSULTATION: Iron deficiency anemia/thrombocytopenia  #Chronic [2015] mild anemia hemoglobin 11.1-question iron deficiency versus others.-Dr. Elliott/Dr. Mike Gip; s/p Venofer.   #Mild thrombocytopenia platelets 120s  #Fatigue/ PN/DM; MELANOMA of mid back [May 2021] s/p excision.   Oncology History   No history exists.    HISTORY OF PRESENTING ILLNESS: Alone.  Ambulating independently. Kim Ballard 86 y.o.  female longstanding history of iron deficient anemia/thrombocytopenia is here for follow-up.  Complains of ongoing fatigue.  No fever no chills.  No nausea or vomiting.   Review of Systems  Constitutional:  Positive for malaise/fatigue. Negative for chills, diaphoresis, fever and weight loss.  HENT:  Negative for nosebleeds and sore throat.   Eyes:  Negative for double vision.  Respiratory:  Negative for cough, hemoptysis, sputum production, shortness of breath and wheezing.   Cardiovascular:  Negative for chest pain, palpitations, orthopnea and leg swelling.  Gastrointestinal:  Negative for blood in stool, constipation, diarrhea, heartburn, melena, nausea and vomiting.  Genitourinary:  Positive for dysuria.  Musculoskeletal:  Negative for back pain and joint pain.  Skin: Negative.  Negative for itching and rash.  Neurological:  Positive for tingling. Negative for dizziness, focal weakness, weakness and headaches.  Endo/Heme/Allergies:  Does not bruise/bleed easily.  Psychiatric/Behavioral:  Negative for depression. The patient is not nervous/anxious and does not have insomnia.     MEDICAL HISTORY:  Past Medical History:  Diagnosis Date  . Allergic state   . Anemia   .  Arthritis   . Cancer ([place])    skin cancers  . Chronic cystitis with hematuria   . Depression   . Diabetes mellitus without complication ([place])   . Fibrocystic disease of both breasts   . Fibromyalgia   . GERD (gastroesophageal reflux disease)   . Hypertension   . IBS (irritable bowel syndrome)   . Nausea   . Skin cancer   . Wears dentures    full upper and lower    SURGICAL HISTORY: Past Surgical History:  Procedure Laterality Date  . ABDOMINAL HYSTERECTOMY    . APPENDECTOMY    . BREAST BIOPSY Right 2001   surgical bx   . CATARACT EXTRACTION W/PHACO Right 03/08/2017   Procedure: CATARACT EXTRACTION PHACO AND INTRAOCULAR LENS PLACEMENT ([place]) RIGHT DIABETIC TORIC;  Surgeon: Leandrew Koyanagi, MD;  Location: [facility];  Service: Ophthalmology;  Laterality: Right;  Diabetic - oral meds  . CATARACT EXTRACTION W/PHACO Left 04/17/2017   Procedure: CATARACT EXTRACTION PHACO AND INTRAOCULAR LENS PLACEMENT ([place]) LEFT DIABETIC;  Surgeon: Leandrew Koyanagi, MD;  Location: [facility];  Service: Ophthalmology;  Laterality: Left;  diabetic-oral meds  . CHOLECYSTECTOMY    . COLONOSCOPY    . COLONOSCOPY WITH PROPOFOL N/A 08/27/2015   Procedure: COLONOSCOPY WITH PROPOFOL;  Surgeon: Manya Silvas, MD;  Location: [facility] ENDOSCOPY;  Service: Endoscopy;  Laterality: N/A;  . COLONOSCOPY WITH PROPOFOL N/A 05/02/2016   Procedure: COLONOSCOPY WITH PROPOFOL;  Surgeon: Manya Silvas, MD;  Location: [facility] ENDOSCOPY;  Service: Endoscopy;  Laterality: N/A;  . ESOPHAGOGASTRODUODENOSCOPY    . ESOPHAGOGASTRODUODENOSCOPY (EGD) WITH PROPOFOL N/A 08/27/2015   Procedure: ESOPHAGOGASTRODUODENOSCOPY (EGD) WITH PROPOFOL;  Surgeon: Manya Silvas, MD;  Location: [facility] ENDOSCOPY;  Service: Endoscopy;  Laterality: N/A;  . HERNIA REPAIR  SOCIAL HISTORY: Social History   Socioeconomic History  . Marital status: Married    Spouse name: Not on file  . Number of children: Not on file  .  Years of education: Not on file  . Highest education level: Not on file  Occupational History  . Not on file  Tobacco Use  . Smoking status: Former    Types: Cigarettes    Quit date: 1960    Years since quitting: 62.8  . Smokeless tobacco: Never  Vaping Use  . Vaping Use: Never used  Substance and Sexual Activity  . Alcohol use: No  . Drug use: No  . Sexual activity: Not Currently  Other Topics Concern  . Not on file  Social History Narrative  . Not on file   Social Determinants of Health   Financial Resource Strain: Not on file  Food Insecurity: Not on file  Transportation Needs: Not on file  Physical Activity: Not on file  Stress: Not on file  Social Connections: Not on file  Intimate Partner Violence: Not on file    FAMILY HISTORY: Family History  Problem Relation Age of Onset  . Hypertension Mother   . Ovarian cancer Paternal Grandmother   . COPD Father   . Hyperlipidemia Father   . Kidney disease Father   . Diabetes Maternal Grandmother   . Breast cancer Neg Hx     ALLERGIES:  is allergic to biaxin [clarithromycin], brompheniramine maleate, ceclor [cefaclor], ciprofloxacin, clinoril [sulindac], codeine sulfate, diovan [valsartan], doxycycline, erythromycin, esomeprazole, fluoxetine, guaifenesin & derivatives, levaquin [levofloxacin in d5w], lisinopril, lodine [etodolac], macrodantin [nitrofurantoin macrocrystal], medrol [methylprednisolone], mobic [meloxicam], motrin [ibuprofen], nexium [esomeprazole magnesium], nitrofurantoin, norvasc [amlodipine besylate], omnicef [cefdinir], penicillins, prozac [fluoxetine hcl], pseudoephedrine, reglan [metoclopramide], seldane [terfenadine], sulfa antibiotics, toprol xl [metoprolol tartrate], triamterene, tussionex pennkinetic er [employer] polst-cpm polst er], and vibramycin [doxycycline calcium].  MEDICATIONS:  Current Outpatient Medications  Medication Sig Dispense Refill  . Ascorbic Acid (VITAMIN C) 1000 MG tablet Take  1,000 mg by mouth 2 times daily at 12 noon and 4 pm.    . cholecalciferol (VITAMIN D3) 25 MCG (1000 UT) tablet Take 1,000 Units by mouth 2 (two) times daily.    . clidinium-chlordiazePOXIDE (LIBRAX) 5-2.5 MG capsule Take 1 capsule by mouth.    . Cranberry 405 MG CAPS Take 2 capsules by mouth 2 (two) times daily.     Marland Kitchen FERREX 150 150 MG capsule     . fluticasone (FLONASE) 50 MCG/ACT nasal spray Place into both nostrils daily.    Marland Kitchen gabapentin (NEURONTIN) 100 MG capsule Take 1 capsule by mouth daily.    Marland Kitchen gemfibrozil (LOPID) 600 MG tablet Take 600 mg by mouth 2 (two) times daily before a meal.    . glipiZIDE (GLUCOTROL XL) 5 MG 24 hr tablet Take 1 tablet by mouth daily.    . hydrochlorothiazide (HYDRODIURIL) 25 MG tablet Take 25 mg by mouth daily.    . Lactobacillus Rhamnosus, GG, ([employer] PROBIOTIC, LACTOBACILLUS, PO) Take by mouth.    . metFORMIN (GLUCOPHAGE) 500 MG tablet Take 1,000 mg by mouth 2 (two) times daily with a meal.     . Multiple Vitamins-Minerals (MULTIVITAMIN WITH MINERALS) tablet Take 1 tablet by mouth daily.    . OMEGA-3 FATTY ACIDS PO Take by mouth.    . pantoprazole (PROTONIX) 40 MG tablet Take 40 mg by mouth daily.    . vitamin B-12 (CYANOCOBALAMIN) 1000 MCG tablet Take 1,000 mcg by mouth daily.    . vitamin E 100  UNIT capsule Take by mouth daily.     No current facility-administered medications for this visit.   Facility-Administered Medications Ordered in Other Visits  Medication Dose Route Frequency Provider Last Rate Last Admin  . 0.9 %  sodium chloride infusion   Intravenous Continuous Cammie Sickle, MD   Stopped at 03/22/21 1451  . iron sucrose (VENOFER) injection 200 mg  200 mg Intravenous Once Lequita Asal, MD          .  PHYSICAL EXAMINATION: ECOG PERFORMANCE STATUS: 0 - Asymptomatic  Vitals:   03/22/21 1313 03/22/21 1315  BP: (!) 187/86 (!) 187/88  Pulse: 74 74  Resp: 20   Temp: (!) 97.3 F (36.3 C)    Filed Weights   03/22/21 1313   Weight: 149 lb (67.6 kg)    Physical Exam Constitutional:      Comments: Patient is alone.  Ambulating dependently.  HENT:     Head: Normocephalic and atraumatic.     Mouth/Throat:     Pharynx: No oropharyngeal exudate.  Eyes:     Pupils: Pupils are equal, round, and reactive to light.  Cardiovascular:     Rate and Rhythm: Normal rate and regular rhythm.  Pulmonary:     Effort: Pulmonary effort is normal. No respiratory distress.     Breath sounds: Normal breath sounds. No wheezing.  Abdominal:     General: Bowel sounds are normal. There is no distension.     Palpations: Abdomen is soft. There is no mass.     Tenderness: There is no abdominal tenderness. There is no guarding or rebound.  Musculoskeletal:        General: No tenderness. Normal range of motion.     Cervical back: Normal range of motion and neck supple.  Skin:    General: Skin is warm.  Neurological:     Mental Status: She is alert and oriented to person, place, and time.  Psychiatric:        Mood and Affect: Affect normal.   LABORATORY DATA:  I have reviewed the data as listed Lab Results  Component Value Date   WBC 3.5 (L) 03/22/2021   HGB 10.2 (L) 03/22/2021   HCT 29.1 (L) 03/22/2021   MCV 95.7 03/22/2021   PLT 132 (L) 03/22/2021   Recent Labs    10/13/20 1305 12/17/20 1325 03/22/21 1232  NA 140 140 138  K 4.7 4.6 4.4  CL 103 104 104  CO2 25 26 24   GLUCOSE 180* 274* 218*  BUN 36* 26* 33*  CREATININE 1.51* 0.91 1.02*  CALCIUM 9.8 9.5 9.4  GFRNONAA 34* >60 55*    RADIOGRAPHIC STUDIES: I have personally reviewed the radiological images as listed and agreed with the findings in the report. No results found.  ASSESSMENT & PLAN:   Iron deficiency #Chronic mild anemia-hemoglobin 11.2; July 2022-Iron sat-15%;  Proceed with Venofer today.  # ITP/ Thrombocytopenia-platelet count-132-STABLE. ; on surveillance.  # Elevated HTN- keep a log BPs; Repeat 140/90. Marland Kitchen  And defer to PCP, Dr.Hande.    #Elevated creatinine- 1.5 [baseline ~1.0;Hx of kidney stones Dr.McDermott; Urology]- ?  History of multiple drug-resistant UTIs. At baseline today.   # DISPOSITION: # proceed with  Venofer today # follow up in 4 months/cbc/bmp/iron studies/ferritin--possible Venofer-Dr.B All questions were answered. The patient knows to call the clinic with any problems, questions or concerns.    Cammie Sickle, MD 03/22/2021 4:08 PM

## 2021-03-22 NOTE — Assessment & Plan Note (Signed)
#  Chronic mild anemia-hemoglobin 11.2; July 2022-Iron sat-15%;  Proceed with Venofer today.  # ITP/ Thrombocytopenia-platelet count-132-STABLE. ; on surveillance.  # Elevated HTN- keep a log BPs; Repeat 140/90. Marland Kitchen  And defer to PCP, Dr.Hande.   #Elevated creatinine- 1.5 [baseline ~1.0;Hx of kidney stones Dr.McDermott; Urology]- ?  History of multiple drug-resistant UTIs. At baseline today.   # DISPOSITION: # proceed with  Venofer today # follow up in 4 months/cbc/bmp/iron studies/ferritin--possible Venofer-Dr.B

## 2021-03-22 NOTE — Progress Notes (Signed)
Dr. Starleen Arms made aware of patient BP of 185/77. Per MD, ok to proceed with Venofer infusion.

## 2021-04-15 DIAGNOSIS — Z23 Encounter for immunization: Secondary | ICD-10-CM | POA: Diagnosis not present

## 2021-04-16 DIAGNOSIS — H2000 Unspecified acute and subacute iridocyclitis: Secondary | ICD-10-CM | POA: Diagnosis not present

## 2021-05-21 DIAGNOSIS — L570 Actinic keratosis: Secondary | ICD-10-CM | POA: Diagnosis not present

## 2021-05-21 DIAGNOSIS — Z08 Encounter for follow-up examination after completed treatment for malignant neoplasm: Secondary | ICD-10-CM | POA: Diagnosis not present

## 2021-05-21 DIAGNOSIS — Z86006 Personal history of melanoma in-situ: Secondary | ICD-10-CM | POA: Diagnosis not present

## 2021-05-21 DIAGNOSIS — D485 Neoplasm of uncertain behavior of skin: Secondary | ICD-10-CM | POA: Diagnosis not present

## 2021-05-21 DIAGNOSIS — X32XXXA Exposure to sunlight, initial encounter: Secondary | ICD-10-CM | POA: Diagnosis not present

## 2021-05-21 DIAGNOSIS — Z85828 Personal history of other malignant neoplasm of skin: Secondary | ICD-10-CM | POA: Diagnosis not present

## 2021-05-21 DIAGNOSIS — D044 Carcinoma in situ of skin of scalp and neck: Secondary | ICD-10-CM | POA: Diagnosis not present

## 2021-05-21 DIAGNOSIS — Z85820 Personal history of malignant melanoma of skin: Secondary | ICD-10-CM | POA: Diagnosis not present

## 2021-05-21 DIAGNOSIS — C4441 Basal cell carcinoma of skin of scalp and neck: Secondary | ICD-10-CM | POA: Diagnosis not present

## 2021-06-02 DIAGNOSIS — R351 Nocturia: Secondary | ICD-10-CM | POA: Diagnosis not present

## 2021-06-14 DIAGNOSIS — M461 Sacroiliitis, not elsewhere classified: Secondary | ICD-10-CM | POA: Diagnosis not present

## 2021-06-14 DIAGNOSIS — M9903 Segmental and somatic dysfunction of lumbar region: Secondary | ICD-10-CM | POA: Diagnosis not present

## 2021-06-14 DIAGNOSIS — M5451 Vertebrogenic low back pain: Secondary | ICD-10-CM | POA: Diagnosis not present

## 2021-06-14 DIAGNOSIS — M9904 Segmental and somatic dysfunction of sacral region: Secondary | ICD-10-CM | POA: Diagnosis not present

## 2021-06-15 DIAGNOSIS — M9904 Segmental and somatic dysfunction of sacral region: Secondary | ICD-10-CM | POA: Diagnosis not present

## 2021-06-15 DIAGNOSIS — M5451 Vertebrogenic low back pain: Secondary | ICD-10-CM | POA: Diagnosis not present

## 2021-06-15 DIAGNOSIS — M9903 Segmental and somatic dysfunction of lumbar region: Secondary | ICD-10-CM | POA: Diagnosis not present

## 2021-06-15 DIAGNOSIS — M461 Sacroiliitis, not elsewhere classified: Secondary | ICD-10-CM | POA: Diagnosis not present

## 2021-06-17 DIAGNOSIS — M461 Sacroiliitis, not elsewhere classified: Secondary | ICD-10-CM | POA: Diagnosis not present

## 2021-06-17 DIAGNOSIS — M9903 Segmental and somatic dysfunction of lumbar region: Secondary | ICD-10-CM | POA: Diagnosis not present

## 2021-06-17 DIAGNOSIS — M5451 Vertebrogenic low back pain: Secondary | ICD-10-CM | POA: Diagnosis not present

## 2021-06-17 DIAGNOSIS — M9904 Segmental and somatic dysfunction of sacral region: Secondary | ICD-10-CM | POA: Diagnosis not present

## 2021-06-24 DIAGNOSIS — L820 Inflamed seborrheic keratosis: Secondary | ICD-10-CM | POA: Diagnosis not present

## 2021-06-24 DIAGNOSIS — I10 Essential (primary) hypertension: Secondary | ICD-10-CM | POA: Diagnosis not present

## 2021-06-24 DIAGNOSIS — C4441 Basal cell carcinoma of skin of scalp and neck: Secondary | ICD-10-CM | POA: Diagnosis not present

## 2021-06-24 DIAGNOSIS — J208 Acute bronchitis due to other specified organisms: Secondary | ICD-10-CM | POA: Diagnosis not present

## 2021-06-24 DIAGNOSIS — R109 Unspecified abdominal pain: Secondary | ICD-10-CM | POA: Diagnosis not present

## 2021-06-24 DIAGNOSIS — D696 Thrombocytopenia, unspecified: Secondary | ICD-10-CM | POA: Diagnosis not present

## 2021-06-24 DIAGNOSIS — L905 Scar conditions and fibrosis of skin: Secondary | ICD-10-CM | POA: Diagnosis not present

## 2021-06-24 DIAGNOSIS — D044 Carcinoma in situ of skin of scalp and neck: Secondary | ICD-10-CM | POA: Diagnosis not present

## 2021-06-24 DIAGNOSIS — L538 Other specified erythematous conditions: Secondary | ICD-10-CM | POA: Diagnosis not present

## 2021-06-24 DIAGNOSIS — U071 COVID-19: Secondary | ICD-10-CM | POA: Diagnosis not present

## 2021-06-24 DIAGNOSIS — E1165 Type 2 diabetes mellitus with hyperglycemia: Secondary | ICD-10-CM | POA: Diagnosis not present

## 2021-07-01 DIAGNOSIS — E119 Type 2 diabetes mellitus without complications: Secondary | ICD-10-CM | POA: Diagnosis not present

## 2021-07-01 DIAGNOSIS — F32A Depression, unspecified: Secondary | ICD-10-CM | POA: Diagnosis not present

## 2021-07-01 DIAGNOSIS — C434 Malignant melanoma of scalp and neck: Secondary | ICD-10-CM | POA: Insufficient documentation

## 2021-07-01 DIAGNOSIS — J019 Acute sinusitis, unspecified: Secondary | ICD-10-CM | POA: Diagnosis not present

## 2021-07-01 DIAGNOSIS — Z0000 Encounter for general adult medical examination without abnormal findings: Secondary | ICD-10-CM | POA: Diagnosis not present

## 2021-07-01 DIAGNOSIS — I10 Essential (primary) hypertension: Secondary | ICD-10-CM | POA: Diagnosis not present

## 2021-07-05 DIAGNOSIS — M9903 Segmental and somatic dysfunction of lumbar region: Secondary | ICD-10-CM | POA: Diagnosis not present

## 2021-07-05 DIAGNOSIS — M5451 Vertebrogenic low back pain: Secondary | ICD-10-CM | POA: Diagnosis not present

## 2021-07-05 DIAGNOSIS — M461 Sacroiliitis, not elsewhere classified: Secondary | ICD-10-CM | POA: Diagnosis not present

## 2021-07-05 DIAGNOSIS — M9904 Segmental and somatic dysfunction of sacral region: Secondary | ICD-10-CM | POA: Diagnosis not present

## 2021-07-06 DIAGNOSIS — M9903 Segmental and somatic dysfunction of lumbar region: Secondary | ICD-10-CM | POA: Diagnosis not present

## 2021-07-06 DIAGNOSIS — M9904 Segmental and somatic dysfunction of sacral region: Secondary | ICD-10-CM | POA: Diagnosis not present

## 2021-07-06 DIAGNOSIS — M461 Sacroiliitis, not elsewhere classified: Secondary | ICD-10-CM | POA: Diagnosis not present

## 2021-07-06 DIAGNOSIS — M5451 Vertebrogenic low back pain: Secondary | ICD-10-CM | POA: Diagnosis not present

## 2021-07-08 DIAGNOSIS — M9903 Segmental and somatic dysfunction of lumbar region: Secondary | ICD-10-CM | POA: Diagnosis not present

## 2021-07-08 DIAGNOSIS — M461 Sacroiliitis, not elsewhere classified: Secondary | ICD-10-CM | POA: Diagnosis not present

## 2021-07-08 DIAGNOSIS — M5451 Vertebrogenic low back pain: Secondary | ICD-10-CM | POA: Diagnosis not present

## 2021-07-08 DIAGNOSIS — M9904 Segmental and somatic dysfunction of sacral region: Secondary | ICD-10-CM | POA: Diagnosis not present

## 2021-07-09 DIAGNOSIS — C439 Malignant melanoma of skin, unspecified: Secondary | ICD-10-CM

## 2021-07-09 HISTORY — DX: Malignant melanoma of skin, unspecified: C43.9

## 2021-07-12 ENCOUNTER — Other Ambulatory Visit: Payer: Self-pay | Admitting: *Deleted

## 2021-07-12 DIAGNOSIS — M9901 Segmental and somatic dysfunction of cervical region: Secondary | ICD-10-CM | POA: Diagnosis not present

## 2021-07-12 DIAGNOSIS — D509 Iron deficiency anemia, unspecified: Secondary | ICD-10-CM

## 2021-07-12 DIAGNOSIS — M531 Cervicobrachial syndrome: Secondary | ICD-10-CM | POA: Diagnosis not present

## 2021-07-12 DIAGNOSIS — M542 Cervicalgia: Secondary | ICD-10-CM | POA: Diagnosis not present

## 2021-07-20 DIAGNOSIS — M531 Cervicobrachial syndrome: Secondary | ICD-10-CM | POA: Diagnosis not present

## 2021-07-20 DIAGNOSIS — M9901 Segmental and somatic dysfunction of cervical region: Secondary | ICD-10-CM | POA: Diagnosis not present

## 2021-07-20 DIAGNOSIS — M542 Cervicalgia: Secondary | ICD-10-CM | POA: Diagnosis not present

## 2021-07-22 DIAGNOSIS — K589 Irritable bowel syndrome without diarrhea: Secondary | ICD-10-CM | POA: Diagnosis not present

## 2021-07-23 ENCOUNTER — Encounter: Payer: Self-pay | Admitting: Internal Medicine

## 2021-07-23 ENCOUNTER — Inpatient Hospital Stay: Payer: Medicare HMO | Admitting: Internal Medicine

## 2021-07-23 ENCOUNTER — Inpatient Hospital Stay: Payer: Medicare HMO

## 2021-07-23 ENCOUNTER — Other Ambulatory Visit: Payer: Self-pay

## 2021-07-23 ENCOUNTER — Inpatient Hospital Stay: Payer: Medicare HMO | Attending: Internal Medicine

## 2021-07-23 VITALS — BP 173/85 | HR 79 | Temp 96.7°F | Ht 67.0 in | Wt 146.6 lb

## 2021-07-23 DIAGNOSIS — D696 Thrombocytopenia, unspecified: Secondary | ICD-10-CM | POA: Insufficient documentation

## 2021-07-23 DIAGNOSIS — D509 Iron deficiency anemia, unspecified: Secondary | ICD-10-CM | POA: Diagnosis not present

## 2021-07-23 DIAGNOSIS — Z87891 Personal history of nicotine dependence: Secondary | ICD-10-CM | POA: Insufficient documentation

## 2021-07-23 LAB — CBC WITH DIFFERENTIAL/PLATELET
Abs Immature Granulocytes: 0.03 10*3/uL (ref 0.00–0.07)
Basophils Absolute: 0.1 10*3/uL (ref 0.0–0.1)
Basophils Relative: 1 %
Eosinophils Absolute: 0.2 10*3/uL (ref 0.0–0.5)
Eosinophils Relative: 4 %
HCT: 31.8 % — ABNORMAL LOW (ref 36.0–46.0)
Hemoglobin: 10.8 g/dL — ABNORMAL LOW (ref 12.0–15.0)
Immature Granulocytes: 1 %
Lymphocytes Relative: 23 %
Lymphs Abs: 1.2 10*3/uL (ref 0.7–4.0)
MCH: 32.4 pg (ref 26.0–34.0)
MCHC: 34 g/dL (ref 30.0–36.0)
MCV: 95.5 fL (ref 80.0–100.0)
Monocytes Absolute: 0.4 10*3/uL (ref 0.1–1.0)
Monocytes Relative: 8 %
Neutro Abs: 3.2 10*3/uL (ref 1.7–7.7)
Neutrophils Relative %: 63 %
Platelets: 150 10*3/uL (ref 150–400)
RBC: 3.33 MIL/uL — ABNORMAL LOW (ref 3.87–5.11)
RDW: 12.8 % (ref 11.5–15.5)
WBC: 5 10*3/uL (ref 4.0–10.5)
nRBC: 0 % (ref 0.0–0.2)

## 2021-07-23 LAB — FERRITIN: Ferritin: 386 ng/mL — ABNORMAL HIGH (ref 11–307)

## 2021-07-23 LAB — IRON AND TIBC
Iron: 83 ug/dL (ref 28–170)
Saturation Ratios: 20 % (ref 10.4–31.8)
TIBC: 424 ug/dL (ref 250–450)
UIBC: 341 ug/dL

## 2021-07-23 LAB — BASIC METABOLIC PANEL
Anion gap: 9 (ref 5–15)
BUN: 32 mg/dL — ABNORMAL HIGH (ref 8–23)
CO2: 27 mmol/L (ref 22–32)
Calcium: 9.7 mg/dL (ref 8.9–10.3)
Chloride: 101 mmol/L (ref 98–111)
Creatinine, Ser: 0.87 mg/dL (ref 0.44–1.00)
GFR, Estimated: >60 mL/min (ref >60)
Glucose, Bld: 170 mg/dL — ABNORMAL HIGH (ref 70–99)
Potassium: 4.1 mmol/L (ref 3.5–5.1)
Sodium: 137 mmol/L (ref 135–145)

## 2021-07-23 NOTE — Progress Notes (Signed)
[facility] CONSULT NOTE  Patient Care Team: Tracie Harrier, MD as PCP - General (Internal Medicine) Tracie Harrier, MD as Physician Assistant (Internal Medicine) Lequita Asal, MD (Inactive) as Consulting Physician (Hematology and Oncology)  CHIEF COMPLAINTS/PURPOSE OF CONSULTATION: Iron deficiency anemia/thrombocytopenia  #Chronic [2015] mild anemia hemoglobin 11.1-question iron deficiency versus others.-Dr. Elliott/Dr. Mike Gip; s/p Venofer.   #Mild thrombocytopenia platelets 120s  #Fatigue/ PN/DM; MELANOMA of mid back [May 2021] s/p excision. MELANOMA LEFT neck posterior-s/p excision.Kiara.[place] 2023 ]  Oncology History   No history exists.    HISTORY OF PRESENTING ILLNESS: Alone.  Ambulating independently.  Lynelle Smoke 87 y.o.  female longstanding history of iron deficient anemia/thrombocytopenia is here for follow-up.  Patient had a recent melanoma taken off for left neck. Complains of ongoing fatigue.  No fever no chills.  No nausea or vomiting.   Review of Systems  Constitutional:  Positive for malaise/fatigue. Negative for chills, diaphoresis, fever and weight loss.  HENT:  Negative for nosebleeds and sore throat.   Eyes:  Negative for double vision.  Respiratory:  Negative for cough, hemoptysis, sputum production, shortness of breath and wheezing.   Cardiovascular:  Negative for chest pain, palpitations, orthopnea and leg swelling.  Gastrointestinal:  Negative for blood in stool, constipation, diarrhea, heartburn, melena, nausea and vomiting.  Genitourinary:  Positive for dysuria.  Musculoskeletal:  Negative for back pain and joint pain.  Skin: Negative.  Negative for itching and rash.  Neurological:  Positive for tingling. Negative for dizziness, focal weakness, weakness and headaches.  Endo/Heme/Allergies:  Does not bruise/bleed easily.  Psychiatric/Behavioral:  Negative for depression. The patient is not nervous/anxious and does not have  insomnia.     MEDICAL HISTORY:  Past Medical History:  Diagnosis Date   Allergic state    Anemia    Arthritis    Cancer ([place])    skin cancers   Chronic cystitis with hematuria    Depression    Diabetes mellitus without complication ([place])    Fibrocystic disease of both breasts    Fibromyalgia    GERD (gastroesophageal reflux disease)    Hypertension    IBS (irritable bowel syndrome)    Melanoma ([place]) 07/09/2021   lt neck/shoulder area   Nausea    Skin cancer    Wears dentures    full upper and lower    SURGICAL HISTORY: Past Surgical History:  Procedure Laterality Date   ABDOMINAL HYSTERECTOMY     APPENDECTOMY     BREAST BIOPSY Right 2001   surgical bx    CATARACT EXTRACTION W/PHACO Right 03/08/2017   Procedure: CATARACT EXTRACTION PHACO AND INTRAOCULAR LENS PLACEMENT ([place]) RIGHT DIABETIC TORIC;  Surgeon: Leandrew Koyanagi, MD;  Location: [place];  Service: Ophthalmology;  Laterality: Right;  Diabetic - oral meds   CATARACT EXTRACTION W/PHACO Left 04/17/2017   Procedure: CATARACT EXTRACTION PHACO AND INTRAOCULAR LENS PLACEMENT ([place]) LEFT DIABETIC;  Surgeon: Leandrew Koyanagi, MD;  Location: [place];  Service: Ophthalmology;  Laterality: Left;  diabetic-oral meds   CHOLECYSTECTOMY     COLONOSCOPY     COLONOSCOPY WITH PROPOFOL N/A 08/27/2015   Procedure: COLONOSCOPY WITH PROPOFOL;  Surgeon: Manya Silvas, MD;  Location: [facility] ENDOSCOPY;  Service: Endoscopy;  Laterality: N/A;   COLONOSCOPY WITH PROPOFOL N/A 05/02/2016   Procedure: COLONOSCOPY WITH PROPOFOL;  Surgeon: Manya Silvas, MD;  Location: [facility] ENDOSCOPY;  Service: Endoscopy;  Laterality: N/A;   ESOPHAGOGASTRODUODENOSCOPY     ESOPHAGOGASTRODUODENOSCOPY (EGD) WITH PROPOFOL N/A 08/27/2015  Procedure: ESOPHAGOGASTRODUODENOSCOPY (EGD) WITH PROPOFOL;  Surgeon: Manya Silvas, MD;  Location: [facility] ENDOSCOPY;  Service: Endoscopy;  Laterality: N/A;   HERNIA REPAIR      SOCIAL  HISTORY: Social History   Socioeconomic History   Marital status: Married    Spouse name: Not on file   Number of children: Not on file   Years of education: Not on file   Highest education level: Not on file  Occupational History   Not on file  Tobacco Use   Smoking status: Former    Types: Cigarettes    Quit date: 37    Years since quitting: 63.1   Smokeless tobacco: Never  Vaping Use   Vaping Use: Never used  Substance and Sexual Activity   Alcohol use: No   Drug use: No   Sexual activity: Not Currently  Other Topics Concern   Not on file  Social History Narrative   Not on file   Social Determinants of Health   Financial Resource Strain: Not on file  Food Insecurity: Not on file  Transportation Needs: Not on file  Physical Activity: Not on file  Stress: Not on file  Social Connections: Not on file  Intimate Partner Violence: Not on file    FAMILY HISTORY: Family History  Problem Relation Age of Onset   Hypertension Mother    Ovarian cancer Paternal Grandmother    COPD Father    Hyperlipidemia Father    Kidney disease Father    Diabetes Maternal Grandmother    Breast cancer Neg Hx     ALLERGIES:  is allergic to biaxin [clarithromycin], brompheniramine maleate, ceclor [cefaclor], ciprofloxacin, clinoril [sulindac], codeine sulfate, diovan [valsartan], doxycycline, erythromycin, esomeprazole, fluoxetine, guaifenesin & derivatives, levaquin [levofloxacin in d5w], lisinopril, lodine [etodolac], macrodantin [nitrofurantoin macrocrystal], medrol [methylprednisolone], mobic [meloxicam], motrin [ibuprofen], nexium [esomeprazole magnesium], nitrofurantoin, norvasc [amlodipine besylate], omnicef [cefdinir], penicillins, prozac [fluoxetine hcl], pseudoephedrine, reglan [metoclopramide], seldane [terfenadine], sulfa antibiotics, toprol xl [metoprolol tartrate], triamterene, tussionex pennkinetic er [hydrocod poli-chlorphe poli er], and vibramycin [doxycycline  calcium].  MEDICATIONS:  Current Outpatient Medications  Medication Sig Dispense Refill   Ascorbic Acid (VITAMIN C) 1000 MG tablet Take 1,000 mg by mouth 2 times daily at 12 noon and 4 pm.     cholecalciferol (VITAMIN D3) 25 MCG (1000 UT) tablet Take 1,000 Units by mouth 2 (two) times daily.     clidinium-chlordiazePOXIDE (LIBRAX) 5-2.5 MG capsule Take 1 capsule by mouth.     Cranberry 405 MG CAPS Take 2 capsules by mouth 2 (two) times daily.      FERREX 150 150 MG capsule      fluticasone (FLONASE) 50 MCG/ACT nasal spray Place into both nostrils daily.     gabapentin (NEURONTIN) 100 MG capsule Take 1 capsule by mouth daily.     gemfibrozil (LOPID) 600 MG tablet Take 600 mg by mouth 2 (two) times daily before a meal.     glipiZIDE (GLUCOTROL XL) 5 MG 24 hr tablet Take 1 tablet by mouth daily.     hydrochlorothiazide (HYDRODIURIL) 25 MG tablet Take 25 mg by mouth daily.     Lactobacillus Rhamnosus, GG, ([employer] PROBIOTIC, LACTOBACILLUS, PO) Take by mouth.     metFORMIN (GLUCOPHAGE) 500 MG tablet Take 1,000 mg by mouth 2 (two) times daily with a meal.      Multiple Vitamins-Minerals (MULTIVITAMIN WITH MINERALS) tablet Take 1 tablet by mouth daily.     OMEGA-3 FATTY ACIDS PO Take by mouth.     pantoprazole (PROTONIX) 40 MG  tablet Take 40 mg by mouth daily.     vitamin B-12 (CYANOCOBALAMIN) 1000 MCG tablet Take 1,000 mcg by mouth daily.     vitamin E 100 UNIT capsule Take by mouth daily.     No current facility-administered medications for this visit.   Facility-Administered Medications Ordered in Other Visits  Medication Dose Route Frequency Provider Last Rate Last Admin   iron sucrose (VENOFER) injection 200 mg  200 mg Intravenous Once Lequita Asal, MD          .  PHYSICAL EXAMINATION: ECOG PERFORMANCE STATUS: 0 - Asymptomatic  Vitals:   07/23/21 1338  BP: (!) 173/85  Pulse: 79  Temp: (!) 96.7 F (35.9 C)  SpO2: 100%   Filed Weights   07/23/21 1338  Weight: 146 lb 9.6  oz (66.5 kg)    Physical Exam Constitutional:      Comments: Patient is alone.  Ambulating dependently.  HENT:     Head: Normocephalic and atraumatic.     Mouth/Throat:     Pharynx: No oropharyngeal exudate.  Eyes:     Pupils: Pupils are equal, round, and reactive to light.  Cardiovascular:     Rate and Rhythm: Normal rate and regular rhythm.  Pulmonary:     Effort: Pulmonary effort is normal. No respiratory distress.     Breath sounds: Normal breath sounds. No wheezing.  Abdominal:     General: Bowel sounds are normal. There is no distension.     Palpations: Abdomen is soft. There is no mass.     Tenderness: There is no abdominal tenderness. There is no guarding or rebound.  Musculoskeletal:        General: No tenderness. Normal range of motion.     Cervical back: Normal range of motion and neck supple.  Skin:    General: Skin is warm.  Neurological:     Mental Status: She is alert and oriented to person, place, and time.  Psychiatric:        Mood and Affect: Affect normal.   LABORATORY DATA:  I have reviewed the data as listed Lab Results  Component Value Date   WBC 5.0 07/23/2021   HGB 10.8 (L) 07/23/2021   HCT 31.8 (L) 07/23/2021   MCV 95.5 07/23/2021   PLT 150 07/23/2021   Recent Labs    12/17/20 1325 03/22/21 1232 07/23/21 1324  NA 140 138 137  K 4.6 4.4 4.1  CL 104 104 101  CO2 26 24 27   GLUCOSE 274* 218* 170*  BUN 26* 33* 32*  CREATININE 0.91 1.02* 0.87  CALCIUM 9.5 9.4 9.7  GFRNONAA >60 55* >60    RADIOGRAPHIC STUDIES: I have personally reviewed the radiological images as listed and agreed with the findings in the report. No results found.  ASSESSMENT & PLAN:   Thrombocytopenia ([place]) #Chronic mild anemia-hemoglobin  10-11; OCT 2022-Iron sat-27%; Ferritin- 409; iron panel today pending-  HOLD  Venofer today.  # ITP/ Thrombocytopenia-platelet count-150-STABLE. ; on surveillance.  # Elevated HTN- keep a log BPs; Repeat 170/90. Marland Kitchen  And defer to  PCP, Dr.Hande.   # DISPOSITION: # HOLD  Venofer today # follow up in 4 months MD: labs- cbc/bmp/iron studies/ferritin--possible Venofer-Dr.B All questions were answered. The patient knows to call the clinic with any problems, questions or concerns.    Cammie Sickle, MD 07/23/2021 1:54 PM

## 2021-07-23 NOTE — Assessment & Plan Note (Signed)
#  Chronic mild anemia-hemoglobin  10-11; OCT 2022-Iron sat-27%; Ferritin- 409; iron panel today pending-  HOLD  Venofer today.  # ITP/ Thrombocytopenia-platelet count-150-STABLE. ; on surveillance.  # Elevated HTN- keep a log BPs; Repeat 170/90. Marland Kitchen  And defer to PCP, Dr.Hande.   # DISPOSITION: # HOLD  Venofer today # follow up in 4 months MD: labs- cbc/bmp/iron studies/ferritin--possible Venofer-Dr.B

## 2021-07-26 ENCOUNTER — Other Ambulatory Visit: Payer: Self-pay | Admitting: Physician Assistant

## 2021-07-26 ENCOUNTER — Ambulatory Visit
Admission: RE | Admit: 2021-07-26 | Discharge: 2021-07-26 | Disposition: A | Discharge status: Home / Self Care | Payer: Medicare HMO | Source: Ambulatory Visit | Attending: Physician Assistant | Admitting: Physician Assistant

## 2021-07-26 DIAGNOSIS — I6523 Occlusion and stenosis of bilateral carotid arteries: Secondary | ICD-10-CM | POA: Diagnosis not present

## 2021-07-26 DIAGNOSIS — M47812 Spondylosis without myelopathy or radiculopathy, cervical region: Secondary | ICD-10-CM | POA: Diagnosis not present

## 2021-07-26 DIAGNOSIS — R221 Localized swelling, mass and lump, neck: Secondary | ICD-10-CM | POA: Insufficient documentation

## 2021-07-26 DIAGNOSIS — K110 Atrophy of salivary gland: Secondary | ICD-10-CM | POA: Diagnosis not present

## 2021-07-26 DIAGNOSIS — Z85820 Personal history of malignant melanoma of skin: Secondary | ICD-10-CM | POA: Diagnosis not present

## 2021-07-26 DIAGNOSIS — D485 Neoplasm of uncertain behavior of skin: Secondary | ICD-10-CM | POA: Diagnosis not present

## 2021-07-26 MED ORDER — IOHEXOL 300 MG/ML  SOLN
75.0000 mL | Freq: Once | INTRAMUSCULAR | Status: AC | PRN
  Administered 2021-07-26: 75 mL via INTRATHECAL

## 2021-07-30 DIAGNOSIS — K1121 Acute sialoadenitis: Secondary | ICD-10-CM | POA: Diagnosis not present

## 2021-08-02 ENCOUNTER — Other Ambulatory Visit: Payer: Self-pay | Admitting: Internal Medicine

## 2021-08-02 DIAGNOSIS — Z1231 Encounter for screening mammogram for malignant neoplasm of breast: Secondary | ICD-10-CM

## 2021-08-02 DIAGNOSIS — K1121 Acute sialoadenitis: Secondary | ICD-10-CM | POA: Diagnosis not present

## 2021-08-09 DIAGNOSIS — F331 Major depressive disorder, recurrent, moderate: Secondary | ICD-10-CM | POA: Diagnosis not present

## 2021-08-09 DIAGNOSIS — T380X5A Adverse effect of glucocorticoids and synthetic analogues, initial encounter: Secondary | ICD-10-CM | POA: Diagnosis not present

## 2021-08-09 DIAGNOSIS — Z85820 Personal history of malignant melanoma of skin: Secondary | ICD-10-CM | POA: Diagnosis not present

## 2021-08-09 DIAGNOSIS — R739 Hyperglycemia, unspecified: Secondary | ICD-10-CM | POA: Diagnosis not present

## 2021-08-09 DIAGNOSIS — K118 Other diseases of salivary glands: Secondary | ICD-10-CM | POA: Diagnosis not present

## 2021-08-09 DIAGNOSIS — E1165 Type 2 diabetes mellitus with hyperglycemia: Secondary | ICD-10-CM | POA: Diagnosis not present

## 2021-08-09 DIAGNOSIS — C434 Malignant melanoma of scalp and neck: Secondary | ICD-10-CM | POA: Diagnosis not present

## 2021-08-09 DIAGNOSIS — F32A Depression, unspecified: Secondary | ICD-10-CM | POA: Diagnosis not present

## 2021-08-16 DIAGNOSIS — R399 Unspecified symptoms and signs involving the genitourinary system: Secondary | ICD-10-CM | POA: Diagnosis not present

## 2021-08-16 DIAGNOSIS — R8290 Unspecified abnormal findings in urine: Secondary | ICD-10-CM | POA: Diagnosis not present

## 2021-08-16 DIAGNOSIS — R300 Dysuria: Secondary | ICD-10-CM | POA: Diagnosis not present

## 2021-08-16 DIAGNOSIS — K1121 Acute sialoadenitis: Secondary | ICD-10-CM | POA: Diagnosis not present

## 2021-08-16 DIAGNOSIS — N390 Urinary tract infection, site not specified: Secondary | ICD-10-CM | POA: Diagnosis not present

## 2021-08-19 ENCOUNTER — Other Ambulatory Visit: Payer: Self-pay | Admitting: Unknown Physician Specialty

## 2021-08-19 DIAGNOSIS — K1121 Acute sialoadenitis: Secondary | ICD-10-CM

## 2021-08-23 ENCOUNTER — Other Ambulatory Visit: Payer: Self-pay | Admitting: Unknown Physician Specialty

## 2021-08-23 DIAGNOSIS — K1121 Acute sialoadenitis: Secondary | ICD-10-CM

## 2021-08-26 ENCOUNTER — Other Ambulatory Visit: Payer: Self-pay

## 2021-08-26 ENCOUNTER — Other Ambulatory Visit: Payer: Medicare HMO

## 2021-08-26 ENCOUNTER — Ambulatory Visit
Admission: RE | Admit: 2021-08-26 | Discharge: 2021-08-26 | Disposition: A | Discharge status: Home / Self Care | Payer: Medicare HMO | Source: Ambulatory Visit | Attending: Unknown Physician Specialty | Admitting: Unknown Physician Specialty

## 2021-08-26 DIAGNOSIS — K1120 Sialoadenitis, unspecified: Secondary | ICD-10-CM | POA: Diagnosis not present

## 2021-08-26 DIAGNOSIS — K118 Other diseases of salivary glands: Secondary | ICD-10-CM | POA: Diagnosis not present

## 2021-08-26 DIAGNOSIS — Z85820 Personal history of malignant melanoma of skin: Secondary | ICD-10-CM | POA: Diagnosis not present

## 2021-08-26 DIAGNOSIS — K1121 Acute sialoadenitis: Secondary | ICD-10-CM

## 2021-08-26 DIAGNOSIS — I6523 Occlusion and stenosis of bilateral carotid arteries: Secondary | ICD-10-CM | POA: Diagnosis not present

## 2021-08-26 MED ORDER — IOPAMIDOL (ISOVUE-300) INJECTION 61%
75.0000 mL | Freq: Once | INTRAVENOUS | Status: AC | PRN
  Administered 2021-08-26: 75 mL via INTRAVENOUS

## 2021-09-01 ENCOUNTER — Other Ambulatory Visit: Payer: Self-pay | Admitting: Unknown Physician Specialty

## 2021-09-01 DIAGNOSIS — K118 Other diseases of salivary glands: Secondary | ICD-10-CM

## 2021-09-06 DIAGNOSIS — C434 Malignant melanoma of scalp and neck: Secondary | ICD-10-CM

## 2021-09-06 HISTORY — DX: Malignant melanoma of scalp and neck: C43.4

## 2021-09-07 ENCOUNTER — Ambulatory Visit
Admission: RE | Admit: 2021-09-07 | Discharge: 2021-09-07 | Disposition: A | Discharge status: Home / Self Care | Payer: Medicare HMO | Source: Ambulatory Visit | Attending: Unknown Physician Specialty | Admitting: Unknown Physician Specialty

## 2021-09-07 DIAGNOSIS — K1121 Acute sialoadenitis: Secondary | ICD-10-CM | POA: Diagnosis not present

## 2021-09-07 DIAGNOSIS — K118 Other diseases of salivary glands: Secondary | ICD-10-CM | POA: Insufficient documentation

## 2021-09-07 DIAGNOSIS — D37030 Neoplasm of uncertain behavior of the parotid salivary glands: Secondary | ICD-10-CM | POA: Diagnosis not present

## 2021-09-07 NOTE — Procedures (Signed)
?  Procedure:  [place] FNA L parotid cystic lesion 25g x4 ?Preprocedure diagnosis: The encounter diagnosis was Mass of left parotid gland. ? ?Postprocedure diagnosis: same ?EBL:    minimal ?Complications:   none immediate ? ?See full dictation in [facility] ? ?D. Arne Cleveland MD ?Main # [phone] ?Pager  [phone] ?Mobile [phone] ?  ? ?

## 2021-09-08 LAB — CYTOLOGY - NON PAP

## 2021-09-09 ENCOUNTER — Ambulatory Visit
Admission: RE | Admit: 2021-09-09 | Discharge: 2021-09-09 | Disposition: A | Discharge status: Home / Self Care | Payer: Medicare HMO | Source: Ambulatory Visit | Attending: Internal Medicine | Admitting: Internal Medicine

## 2021-09-09 DIAGNOSIS — Z1231 Encounter for screening mammogram for malignant neoplasm of breast: Secondary | ICD-10-CM | POA: Insufficient documentation

## 2021-09-29 DIAGNOSIS — R8290 Unspecified abnormal findings in urine: Secondary | ICD-10-CM | POA: Diagnosis not present

## 2021-09-29 DIAGNOSIS — R399 Unspecified symptoms and signs involving the genitourinary system: Secondary | ICD-10-CM | POA: Diagnosis not present

## 2021-09-29 DIAGNOSIS — R300 Dysuria: Secondary | ICD-10-CM | POA: Diagnosis not present

## 2021-10-14 DIAGNOSIS — E113293 Type 2 diabetes mellitus with mild nonproliferative diabetic retinopathy without macular edema, bilateral: Secondary | ICD-10-CM | POA: Diagnosis not present

## 2021-10-14 DIAGNOSIS — Z961 Presence of intraocular lens: Secondary | ICD-10-CM | POA: Diagnosis not present

## 2021-10-14 DIAGNOSIS — H04123 Dry eye syndrome of bilateral lacrimal glands: Secondary | ICD-10-CM | POA: Diagnosis not present

## 2021-10-21 ENCOUNTER — Other Ambulatory Visit: Payer: Self-pay | Admitting: Unknown Physician Specialty

## 2021-10-21 DIAGNOSIS — D37030 Neoplasm of uncertain behavior of the parotid salivary glands: Secondary | ICD-10-CM | POA: Diagnosis not present

## 2021-10-21 DIAGNOSIS — K1121 Acute sialoadenitis: Secondary | ICD-10-CM

## 2021-10-28 DIAGNOSIS — D649 Anemia, unspecified: Secondary | ICD-10-CM | POA: Diagnosis not present

## 2021-10-28 DIAGNOSIS — K5909 Other constipation: Secondary | ICD-10-CM | POA: Diagnosis not present

## 2021-10-28 DIAGNOSIS — F330 Major depressive disorder, recurrent, mild: Secondary | ICD-10-CM | POA: Diagnosis not present

## 2021-10-28 DIAGNOSIS — E1165 Type 2 diabetes mellitus with hyperglycemia: Secondary | ICD-10-CM | POA: Diagnosis not present

## 2021-10-28 DIAGNOSIS — C434 Malignant melanoma of scalp and neck: Secondary | ICD-10-CM | POA: Diagnosis not present

## 2021-10-28 DIAGNOSIS — D696 Thrombocytopenia, unspecified: Secondary | ICD-10-CM | POA: Diagnosis not present

## 2021-10-28 DIAGNOSIS — J019 Acute sinusitis, unspecified: Secondary | ICD-10-CM | POA: Diagnosis not present

## 2021-10-28 DIAGNOSIS — I10 Essential (primary) hypertension: Secondary | ICD-10-CM | POA: Diagnosis not present

## 2021-11-03 DIAGNOSIS — N390 Urinary tract infection, site not specified: Secondary | ICD-10-CM | POA: Diagnosis not present

## 2021-11-03 DIAGNOSIS — R220 Localized swelling, mass and lump, head: Secondary | ICD-10-CM | POA: Diagnosis not present

## 2021-11-03 DIAGNOSIS — M792 Neuralgia and neuritis, unspecified: Secondary | ICD-10-CM | POA: Diagnosis not present

## 2021-11-03 DIAGNOSIS — Z0000 Encounter for general adult medical examination without abnormal findings: Secondary | ICD-10-CM | POA: Diagnosis not present

## 2021-11-03 DIAGNOSIS — I10 Essential (primary) hypertension: Secondary | ICD-10-CM | POA: Diagnosis not present

## 2021-11-03 DIAGNOSIS — Z85820 Personal history of malignant melanoma of skin: Secondary | ICD-10-CM | POA: Diagnosis not present

## 2021-11-03 DIAGNOSIS — Z1389 Encounter for screening for other disorder: Secondary | ICD-10-CM | POA: Diagnosis not present

## 2021-11-03 DIAGNOSIS — R739 Hyperglycemia, unspecified: Secondary | ICD-10-CM | POA: Diagnosis not present

## 2021-11-03 DIAGNOSIS — D649 Anemia, unspecified: Secondary | ICD-10-CM | POA: Diagnosis not present

## 2021-11-04 ENCOUNTER — Ambulatory Visit
Admission: RE | Admit: 2021-11-04 | Discharge: 2021-11-04 | Disposition: A | Discharge status: Home / Self Care | Payer: Medicare HMO | Source: Ambulatory Visit | Attending: Unknown Physician Specialty | Admitting: Unknown Physician Specialty

## 2021-11-04 DIAGNOSIS — K118 Other diseases of salivary glands: Secondary | ICD-10-CM | POA: Diagnosis not present

## 2021-11-04 DIAGNOSIS — Z85820 Personal history of malignant melanoma of skin: Secondary | ICD-10-CM | POA: Diagnosis not present

## 2021-11-04 DIAGNOSIS — K1121 Acute sialoadenitis: Secondary | ICD-10-CM

## 2021-11-04 DIAGNOSIS — I700 Atherosclerosis of aorta: Secondary | ICD-10-CM | POA: Diagnosis not present

## 2021-11-04 DIAGNOSIS — I6529 Occlusion and stenosis of unspecified carotid artery: Secondary | ICD-10-CM | POA: Diagnosis not present

## 2021-11-04 MED ORDER — IOPAMIDOL (ISOVUE-300) INJECTION 61%
75.0000 mL | Freq: Once | INTRAVENOUS | Status: AC | PRN
  Administered 2021-11-04: 75 mL via INTRAVENOUS

## 2021-11-18 ENCOUNTER — Other Ambulatory Visit: Payer: Self-pay

## 2021-11-18 ENCOUNTER — Inpatient Hospital Stay: Payer: Medicare HMO | Attending: Internal Medicine

## 2021-11-18 DIAGNOSIS — D509 Iron deficiency anemia, unspecified: Secondary | ICD-10-CM | POA: Insufficient documentation

## 2021-11-18 LAB — CBC WITH DIFFERENTIAL/PLATELET
Abs Immature Granulocytes: 0.02 10*3/uL (ref 0.00–0.07)
Basophils Absolute: 0 10*3/uL (ref 0.0–0.1)
Basophils Relative: 1 %
Eosinophils Absolute: 0.2 10*3/uL (ref 0.0–0.5)
Eosinophils Relative: 4 %
HCT: 31.5 % — ABNORMAL LOW (ref 36.0–46.0)
Hemoglobin: 10.6 g/dL — ABNORMAL LOW (ref 12.0–15.0)
Immature Granulocytes: 0 %
Lymphocytes Relative: 19 %
Lymphs Abs: 0.9 10*3/uL (ref 0.7–4.0)
MCH: 31.5 pg (ref 26.0–34.0)
MCHC: 33.7 g/dL (ref 30.0–36.0)
MCV: 93.5 fL (ref 80.0–100.0)
Monocytes Absolute: 0.3 10*3/uL (ref 0.1–1.0)
Monocytes Relative: 7 %
Neutro Abs: 3.2 10*3/uL (ref 1.7–7.7)
Neutrophils Relative %: 69 %
Platelets: 130 10*3/uL — ABNORMAL LOW (ref 150–400)
RBC: 3.37 MIL/uL — ABNORMAL LOW (ref 3.87–5.11)
RDW: 12.3 % (ref 11.5–15.5)
WBC: 4.6 10*3/uL (ref 4.0–10.5)
nRBC: 0 % (ref 0.0–0.2)

## 2021-11-18 LAB — BASIC METABOLIC PANEL
Anion gap: 9 (ref 5–15)
BUN: 29 mg/dL — ABNORMAL HIGH (ref 8–23)
CO2: 25 mmol/L (ref 22–32)
Calcium: 9.4 mg/dL (ref 8.9–10.3)
Chloride: 106 mmol/L (ref 98–111)
Creatinine, Ser: 1.21 mg/dL — ABNORMAL HIGH (ref 0.44–1.00)
GFR, Estimated: 44 mL/min — ABNORMAL LOW (ref >60)
Glucose, Bld: 156 mg/dL — ABNORMAL HIGH (ref 70–99)
Potassium: 3.9 mmol/L (ref 3.5–5.1)
Sodium: 140 mmol/L (ref 135–145)

## 2021-11-18 LAB — IRON AND TIBC
Iron: 70 ug/dL (ref 28–170)
Saturation Ratios: 18 % (ref 10.4–31.8)
TIBC: 398 ug/dL (ref 250–450)
UIBC: 328 ug/dL

## 2021-11-18 LAB — FERRITIN: Ferritin: 264 ng/mL (ref 11–307)

## 2021-11-19 ENCOUNTER — Inpatient Hospital Stay (HOSPITAL_BASED_OUTPATIENT_CLINIC_OR_DEPARTMENT_OTHER): Payer: Medicare HMO | Admitting: Internal Medicine

## 2021-11-19 ENCOUNTER — Inpatient Hospital Stay: Payer: Medicare HMO

## 2021-11-19 ENCOUNTER — Encounter: Payer: Self-pay | Admitting: Internal Medicine

## 2021-11-19 VITALS — BP 179/72

## 2021-11-19 DIAGNOSIS — D696 Thrombocytopenia, unspecified: Secondary | ICD-10-CM

## 2021-11-19 DIAGNOSIS — D509 Iron deficiency anemia, unspecified: Secondary | ICD-10-CM

## 2021-11-19 DIAGNOSIS — M7989 Other specified soft tissue disorders: Secondary | ICD-10-CM

## 2021-11-19 MED ORDER — SODIUM CHLORIDE 0.9 % IV SOLN
INTRAVENOUS | Status: DC
  Filled 2021-11-19: qty 250

## 2021-11-19 MED ORDER — IRON SUCROSE 20 MG/ML IV SOLN: 200.0000 mg | Freq: Once | INTRAVENOUS | Status: DC

## 2021-11-19 MED ORDER — IRON SUCROSE 20 MG/ML IV SOLN
200.0000 mg | Freq: Once | INTRAVENOUS | Status: AC
  Administered 2021-11-19: 200 mg via INTRAVENOUS
  Filled 2021-11-19: qty 10

## 2021-11-19 NOTE — Progress Notes (Signed)
[facility] CONSULT NOTE  Patient Care Team: Tracie Harrier, MD as PCP - General (Internal Medicine) Tracie Harrier, MD as Physician Assistant (Internal Medicine) Lequita Asal, MD (Inactive) as Consulting Physician (Hematology and Oncology) Cammie Sickle, MD as Consulting Physician (Oncology)  CHIEF COMPLAINTS/PURPOSE OF CONSULTATION: Iron deficiency anemia/thrombocytopenia  #Chronic [2015] mild anemia hemoglobin 11.1-question iron deficiency versus others.-Dr. Elliott/Dr. Mike Gip; s/p Venofer.   #Mild thrombocytopenia platelets 120s  #Fatigue/ PN/DM; MELANOMA of mid back [May 2021] s/p excision. MELANOMA LEFT neck posterior-s/p excision.[place]ara.Sh[place] 2023 ]  Oncology History   No history exists.    HISTORY OF PRESENTING ILLNESS: Alone.  Ambulating independently.  Kim Ballard 87 y.o.  female longstanding history of iron deficient anemia/thrombocytopenia is here for follow-up.  Patient had a recent melanoma taken off for left neck.  Noted to have "lump in the left neck"-follow-up imaging/biopsy inconclusive.  Followed by Dr. Tami Ribas.  She is currently on Levaquin; s/p prednisone.   Patient also complains of left lower extremity swelling with cramping.  No swelling on the right leg.  Complains of ongoing fatigue.  No fever no chills.  No nausea or vomiting.  No blood in stools or black or stools.  Review of Systems  Constitutional:  Positive for malaise/fatigue. Negative for chills, diaphoresis, fever and weight loss.  HENT:  Negative for nosebleeds and sore throat.   Eyes:  Negative for double vision.  Respiratory:  Negative for cough, hemoptysis, sputum production, shortness of breath and wheezing.   Cardiovascular:  Negative for chest pain, palpitations, orthopnea and leg swelling.  Gastrointestinal:  Negative for blood in stool, constipation, diarrhea, heartburn, melena, nausea and vomiting.  Genitourinary:  Positive for dysuria.   Musculoskeletal:  Negative for back pain and joint pain.  Skin: Negative.  Negative for itching and rash.  Neurological:  Positive for tingling. Negative for dizziness, focal weakness, weakness and headaches.  Endo/Heme/Allergies:  Does not bruise/bleed easily.  Psychiatric/Behavioral:  Negative for depression. The patient is not nervous/anxious and does not have insomnia.      MEDICAL HISTORY:  Past Medical History:  Diagnosis Date   Allergic state    Anemia    Arthritis    Cancer ([employer])    skin cancers   Chronic cystitis with hematuria    Depression    Diabetes mellitus without complication ([place])    Fibrocystic disease of both breasts    Fibromyalgia    GERD (gastroesophageal reflux disease)    Hypertension    IBS (irritable bowel syndrome)    Melanoma ([place]) 07/09/2021   lt neck/shoulder area   Melanoma of neck ([place]) 09/06/2021   left   Nausea    Skin cancer    Wears dentures    full upper and lower    SURGICAL HISTORY: Past Surgical History:  Procedure Laterality Date   ABDOMINAL HYSTERECTOMY     APPENDECTOMY     BREAST BIOPSY Right 2001   surgical bx    CATARACT EXTRACTION W/PHACO Right 03/08/2017   Procedure: CATARACT EXTRACTION PHACO AND INTRAOCULAR LENS PLACEMENT ([place]) RIGHT DIABETIC TORIC;  Surgeon: Leandrew Koyanagi, MD;  Location: [facility];  Service: Ophthalmology;  Laterality: Right;  Diabetic - oral meds   CATARACT EXTRACTION W/PHACO Left 04/17/2017   Procedure: CATARACT EXTRACTION PHACO AND INTRAOCULAR LENS PLACEMENT ([place]) LEFT DIABETIC;  Surgeon: Leandrew Koyanagi, MD;  Location: [facility];  Service: Ophthalmology;  Laterality: Left;  diabetic-oral meds   CHOLECYSTECTOMY     COLONOSCOPY  COLONOSCOPY WITH PROPOFOL N/A 08/27/2015   Procedure: COLONOSCOPY WITH PROPOFOL;  Surgeon: Manya Silvas, MD;  Location[facility] ENDOSCOPY;  Service: Endoscopy;  Laterality: N/A;   COLONOSCOPY WITH PROPOFOL N/A 05/02/2016   Procedure:  COLONOSCOPY WITH PROPOFOL;  Surgeon: Manya Silvas, MD;  Location: [facility] ENDOSCOPY;  Service: Endoscopy;  Laterality: N/A;   ESOPHAGOGASTRODUODENOSCOPY     ESOPHAGOGASTRODUODENOSCOPY (EGD) WITH PROPOFOL N/A 08/27/2015   Procedure: ESOPHAGOGASTRODUODENOSCOPY (EGD) WITH PROPOFOL;  Surgeon: Manya Silvas, MD;  Location: [facility] ENDOSCOPY;  Service: Endoscopy;  Laterality: N/A;   HERNIA REPAIR      SOCIAL HISTORY: Social History   Socioeconomic History   Marital status: Married    Spouse name: Not on file   Number of children: Not on file   Years of education: Not on file   Highest education level: Not on file  Occupational History   Not on file  Tobacco Use   Smoking status: Former    Types: Cigarettes    Quit date: 45    Years since quitting: 63.4   Smokeless tobacco: Never  Vaping Use   Vaping Use: Never used  Substance and Sexual Activity   Alcohol use: No   Drug use: No   Sexual activity: Not Currently  Other Topics Concern   Not on file  Social History Narrative   Not on file   Social Determinants of Health   Financial Resource Strain: Not on file  Food Insecurity: Not on file  Transportation Needs: Not on file  Physical Activity: Not on file  Stress: Not on file  Social Connections: Not on file  Intimate Partner Violence: Not on file    FAMILY HISTORY: Family History  Problem Relation Age of Onset   Hypertension Mother    Ovarian cancer Paternal Grandmother    COPD Father    Hyperlipidemia Father    Kidney disease Father    Diabetes Maternal Grandmother    Breast cancer Neg Hx     ALLERGIES:  is allergic to biaxin [clarithromycin], brompheniramine maleate, ceclor [cefaclor], ciprofloxacin, clinoril [sulindac], codeine sulfate, diovan [valsartan], doxycycline, erythromycin, esomeprazole, fluoxetine, guaifenesin & derivatives, levaquin [levofloxacin in d5w], lisinopril, lodine [etodolac], macrodantin [nitrofurantoin macrocrystal], medrol  [methylprednisolone], mobic [meloxicam], motrin [ibuprofen], nexium [esomeprazole magnesium], nitrofurantoin, norvasc [amlodipine besylate], omnicef [cefdinir], penicillins, prozac [fluoxetine hcl], pseudoephedrine, reglan [metoclopramide], seldane [terfenadine], sulfa antibiotics, toprol xl [metoprolol tartrate], triamterene, tussionex pennkinetic er [hydrocod poli-chlorphe poli er], vibramycin [doxycycline calcium], and amlodipine.  MEDICATIONS:  Current Outpatient Medications  Medication Sig Dispense Refill   Ascorbic Acid (VITAMIN C) 1000 MG tablet Take 1,000 mg by mouth 2 times daily at 12 noon and 4 pm.     cholecalciferol (VITAMIN D3) 25 MCG (1000 UT) tablet Take 1,000 Units by mouth 2 (two) times daily.     clidinium-chlordiazePOXIDE (LIBRAX) 5-2.5 MG capsule Take 1 capsule by mouth.     Cranberry 405 MG CAPS Take 2 capsules by mouth 2 (two) times daily.      FERREX 150 150 MG capsule      fluticasone (FLONASE) 50 MCG/ACT nasal spray Place into both nostrils daily.     gabapentin (NEURONTIN) 100 MG capsule Take 1 capsule by mouth daily.     gemfibrozil (LOPID) 600 MG tablet Take 600 mg by mouth 2 (two) times daily before a meal.     glipiZIDE (GLUCOTROL XL) 5 MG 24 hr tablet Take 1 tablet by mouth daily.     hydrochlorothiazide (HYDRODIURIL) 25 MG tablet Take 25 mg by mouth  daily.     Lactobacillus Rhamnosus, GG, ([employer] PROBIOTIC, LACTOBACILLUS, PO) Take by mouth.     metFORMIN (GLUCOPHAGE) 500 MG tablet Take 1,000 mg by mouth 2 (two) times daily with a meal.      Multiple Vitamins-Minerals (MULTIVITAMIN WITH MINERALS) tablet Take 1 tablet by mouth daily.     OMEGA-3 FATTY ACIDS PO Take by mouth.     pantoprazole (PROTONIX) 40 MG tablet Take 40 mg by mouth daily.     vitamin B-12 (CYANOCOBALAMIN) 1000 MCG tablet Take 1,000 mcg by mouth daily.     vitamin E 100 UNIT capsule Take by mouth daily.     No current facility-administered medications for this visit.   Facility-Administered  Medications Ordered in Other Visits  Medication Dose Route Frequency Provider Last Rate Last Admin   0.9 %  sodium chloride infusion   Intravenous Continuous Cammie Sickle, MD   Stopped at 11/19/21 1434   iron sucrose (VENOFER) injection 200 mg  200 mg Intravenous Once Lequita Asal, MD          .  PHYSICAL EXAMINATION: ECOG PERFORMANCE STATUS: 0 - Asymptomatic  Vitals:   11/19/21 1314  BP: (!) 188/78  Pulse: 81  Temp: (!) 96.6 F (35.9 C)  SpO2: 100%   Filed Weights   11/19/21 1314  Weight: 147 lb 12.8 oz (67 kg)    Physical Exam Constitutional:      Comments: Patient is alone.  Ambulating dependently.  HENT:     Head: Normocephalic and atraumatic.     Mouth/Throat:     Pharynx: No oropharyngeal exudate.  Eyes:     Pupils: Pupils are equal, round, and reactive to light.  Cardiovascular:     Rate and Rhythm: Normal rate and regular rhythm.  Pulmonary:     Effort: Pulmonary effort is normal. No respiratory distress.     Breath sounds: Normal breath sounds. No wheezing.  Abdominal:     General: Bowel sounds are normal. There is no distension.     Palpations: Abdomen is soft. There is no mass.     Tenderness: There is no abdominal tenderness. There is no guarding or rebound.  Musculoskeletal:        General: No tenderness. Normal range of motion.     Cervical back: Normal range of motion and neck supple.  Skin:    General: Skin is warm.  Neurological:     Mental Status: She is alert and oriented to person, place, and time.  Psychiatric:        Mood and Affect: Affect normal.    LABORATORY DATA:  I have reviewed the data as listed Lab Results  Component Value Date   WBC 4.6 11/18/2021   HGB 10.6 (L) 11/18/2021   HCT 31.5 (L) 11/18/2021   MCV 93.5 11/18/2021   PLT 130 (L) 11/18/2021   Recent Labs    03/22/21 1232 07/23/21 1324 11/18/21 1317  NA 138 137 140  K 4.4 4.1 3.9  CL 104 101 106  CO2 '24 27 25  '$ GLUCOSE 218* 170* 156*  BUN 33*  32* 29*  CREATININE 1.02* 0.87 1.21*  CALCIUM 9.4 9.7 9.4  GFRNONAA 55* >60 44*    RADIOGRAPHIC STUDIES: I have personally reviewed the radiological images as listed and agreed with the findings in the report. CT SOFT TISSUE NECK W CONTRAST  Result Date: 11/05/2021 CLINICAL DATA:  87 year old female with left parotid mass, ultrasound guided FNA in April - cytology negative for malignancy.  History of melanoma. EXAM: CT NECK WITH CONTRAST TECHNIQUE: Multidetector CT imaging of the neck was performed using the standard protocol following the bolus administration of intravenous contrast. RADIATION DOSE REDUCTION: This exam was performed according to the departmental dose-optimization program which includes automated exposure control, adjustment of the mA and/or kV according to patient size and/or use of iterative reconstruction technique. CONTRAST:  33m ISOVUE-300 IOPAMIDOL (ISOVUE-300) INJECTION 61% COMPARISON:  Neck CT 08/26/2021 and earlier. FINDINGS: Pharynx and larynx: Laryngeal and pharyngeal soft tissue contours are stable and within normal limits. Negative parapharyngeal and retropharyngeal spaces. Salivary glands: Sublingual space remains negative. Unchanged chronic atrophy of the right submandibular gland. Stable left submandibular gland, within normal limits. Stable right parotid gland, within normal limits. Further regression of the left inferior parotid space cystic lesion, now subcentimeter and fairly subtle on series 3, image 36 (compare to series 3, image 41 previously). No regional inflammation. Left stylomastoid foramen remains normal. Thyroid: Stable subcentimeter thyroid nodules Not clinically significant; no follow-up imaging recommended (ref: J Am Coll Radiol. 2015 Feb;12(2): 143-50). Lymph nodes: Stable, negative.  No cervical lymphadenopathy. Vascular: Major vascular structures in the neck and at the skull base appears stable and patent. Mild cervical carotid atherosclerosis for age.  Limited intracranial: Stable, negative. Calcified atherosclerosis at the skull base. Visualized orbits: Stable, negative. Mastoids and visualized paranasal sinuses: Stable and well aerated. Skeleton: Stable. Absent dentition. No acute or suspicious osseous lesion identified in the neck. Upper chest: Stable. Calcified aortic atherosclerosis. No superior mediastinal lymphadenopathy. Stable and negative lung apices. IMPRESSION: 1. Further regression of the left parotid space cystic lesion which was sampled with ultrasound guidance in April. Favor benign etiology. 2. Otherwise stable CT appearance of the neck. No metastatic disease identified. 3. Aortic Atherosclerosis (ICD10-I70.0). Electronically Signed   By: HGenevie AnnM.D.   On: 11/05/2021 07:20    ASSESSMENT & PLAN:   [place] #Chronic mild anemia-hemoglobin  10-11; JUNE 2023--Iron sat-18%; Ferritin- 409; Proceed with Venofer today.  # ITP/ Thrombocytopenia-platelet count-130-150-STABLE. ; on surveillance.  # Elevated HTN- keep a log BPs; Repeat 170/90. .Marland Kitchen And defer to PCP, Dr.Hande.   #Left lower extrimity swelling-question etiology.  Order Dopplers; keep legs elevated.  # DISPOSITION: # LE dopplers ASAP #  Venofer today # follow up in 4 months MD: labs- cbc/bmp/iron studies/ferritin--possible Venofer-Dr.B All questions were answered. The patient knows to call the clinic with any problems, questions or concerns.    GCammie Sickle MD 11/19/2021 3:27 PM

## 2021-11-19 NOTE — Assessment & Plan Note (Addendum)
#  Chronic mild anemia-hemoglobin  10-11; JUNE 2023--Iron sat-18%; Ferritin- 409; Proceed with Venofer today.  # ITP/ Thrombocytopenia-platelet count-130-150-STABLE. ; on surveillance.  # Elevated HTN- keep a log BPs; Repeat 170/90. Marland Kitchen  And defer to PCP, Dr.Hande.   #Left lower extrimity swelling-question etiology.  Order Dopplers; keep legs elevated.  # DISPOSITION: # LE dopplers ASAP #  Venofer today # follow up in 4 months MD: labs- cbc/bmp/iron studies/ferritin--possible Venofer-Dr.B

## 2021-11-19 NOTE — Patient Instructions (Signed)
[facility] CANCER CTR AT [place]  Discharge Instructions: Thank you for choosing [facility] to provide your oncology and hematology care.  If you have a lab appointment with the [facility], please go directly to the [facility] and check in at the registration area.  Wear comfortable clothing and clothing appropriate for easy access to any Portacath or PICC line.   We strive to give you quality time with your provider. You may need to reschedule your appointment if you arrive late (15 or more minutes).  Arriving late affects you and other patients whose appointments are after yours.  Also, if you miss three or more appointments without notifying the office, you may be dismissed from the clinic at the provider's discretion.      For prescription refill requests, have your pharmacy contact our office and allow 72 hours for refills to be completed.    Today you received the following chemotherapy and/or immunotherapy agents VENOFER      To help prevent nausea and vomiting after your treatment, we encourage you to take your nausea medication as directed.  BELOW ARE SYMPTOMS THAT SHOULD BE REPORTED IMMEDIATELY: *FEVER GREATER THAN 100.4 F (38 C) OR HIGHER *CHILLS OR SWEATING *NAUSEA AND VOMITING THAT IS NOT CONTROLLED WITH YOUR NAUSEA MEDICATION *UNUSUAL SHORTNESS OF BREATH *UNUSUAL BRUISING OR BLEEDING *URINARY PROBLEMS (pain or burning when urinating, or frequent urination) *BOWEL PROBLEMS (unusual diarrhea, constipation, pain near the anus) TENDERNESS IN MOUTH AND THROAT WITH OR WITHOUT PRESENCE OF ULCERS (sore throat, sores in mouth, or a toothache) UNUSUAL RASH, SWELLING OR PAIN  UNUSUAL VAGINAL DISCHARGE OR ITCHING   Items with * indicate a potential emergency and should be followed up as soon as possible or go to the Emergency Department if any problems should occur.  Please show the CHEMOTHERAPY ALERT CARD or IMMUNOTHERAPY ALERT CARD at check-in to the  Emergency Department and triage nurse.  Should you have questions after your visit or need to cancel or reschedule your appointment, please contact [facility] CANCER [place] AT [place]  [phone] and follow the prompts.  Office hours are 8:00 a.m. to 4:30 p.m. Monday - Friday. Please note that voicemails left after 4:00 p.m. may not be returned until the following business day.  We are closed weekends and major holidays. You have access to a nurse at all times for urgent questions. Please call the main number to the clinic [phone] and follow the prompts.  Iron Sucrose Injection What is this medication? IRON SUCROSE (EYE ern SOO krose) treats low levels of iron (iron deficiency anemia) in people with kidney disease. Iron is a mineral that plays an important role in making red blood cells, which carry oxygen from your lungs to the rest of your body. This medicine may be used for other purposes; ask your health care provider or pharmacist if you have questions. COMMON BRAND NAME(S): Venofer What should I tell my care team before I take this medication? They need to know if you have any of these conditions: Anemia not caused by low iron levels Heart disease High levels of iron in the blood Kidney disease Liver disease An unusual or allergic reaction to iron, other medications, foods, dyes, or preservatives Pregnant or trying to get pregnant Breast-feeding How should I use this medication? This medication is for infusion into a vein. It is given in a hospital or clinic setting. Talk to your care team about the use of this medication in children. While this medication may  be prescribed for children as young as 2 years for selected conditions, precautions do apply. Overdosage: If you think you have taken too much of this medicine contact a poison control center or emergency room at once. NOTE: This medicine is only for you. Do not share this medicine with others. What if I miss a  dose? It is important not to miss your dose. Call your care team if you are unable to keep an appointment. What may interact with this medication? Do not take this medication with any of the following: Deferoxamine Dimercaprol Other iron products This medication may also interact with the following: Chloramphenicol Deferasirox This list may not describe all possible interactions. Give your health care provider a list of all the medicines, herbs, non-prescription drugs, or dietary supplements you use. Also tell them if you smoke, drink alcohol, or use illegal drugs. Some items may interact with your medicine. What should I watch for while using this medication? Visit your care team regularly. Tell your care team if your symptoms do not start to get better or if they get worse. You may need blood work done while you are taking this medication. You may need to follow a special diet. Talk to your care team. Foods that contain iron include: whole grains/cereals, dried fruits, beans, or peas, leafy green vegetables, and organ meats (liver, kidney). What side effects may I notice from receiving this medication? Side effects that you should report to your care team as soon as possible: Allergic reactions--skin rash, itching, hives, swelling of the face, lips, tongue, or throat Low blood pressure--dizziness, feeling faint or lightheaded, blurry vision Shortness of breath Side effects that usually do not require medical attention (report to your care team if they continue or are bothersome): Flushing Headache Joint pain Muscle pain Nausea Pain, redness, or irritation at injection site This list may not describe all possible side effects. Call your doctor for medical advice about side effects. You may report side effects to FDA at 1-800-FDA-1088. Where should I keep my medication? This medication is given in a hospital or clinic and will not be stored at home. NOTE: This sheet is a summary. It may  not cover all possible information. If you have questions about this medicine, talk to your doctor, pharmacist, or health care provider.  2023 Elsevier/Gold Standard (2020-10-16 00:00:00)   For any non-urgent questions, you may also contact your provider using MyChart. We now offer e-Visits for anyone 13 and older to request care online for non-urgent symptoms. For details visit [email or web address].   Also download the MyChart app! Go to the app store, search "MyChart", open the app, select [facility], and log in with your MyChart username and password.  Masks are optional in the cancer centers. If you would like for your care team to wear a mask while they are taking care of you, please let them know. For doctor visits, patients may have with them one support person who is at least 85 years old. At this time, visitors are not allowed in the infusion area.

## 2021-11-25 DIAGNOSIS — D37030 Neoplasm of uncertain behavior of the parotid salivary glands: Secondary | ICD-10-CM | POA: Diagnosis not present

## 2021-12-07 ENCOUNTER — Ambulatory Visit
Admission: EM | Admit: 2021-12-07 | Discharge: 2021-12-07 | Disposition: A | Discharge status: Home / Self Care | Payer: Medicare HMO | Attending: Physician Assistant | Admitting: Physician Assistant

## 2021-12-07 ENCOUNTER — Ambulatory Visit (INDEPENDENT_AMBULATORY_CARE_PROVIDER_SITE_OTHER): Payer: Medicare HMO

## 2021-12-07 ENCOUNTER — Encounter: Payer: Self-pay | Admitting: Emergency Medicine

## 2021-12-07 DIAGNOSIS — M542 Cervicalgia: Secondary | ICD-10-CM

## 2021-12-07 DIAGNOSIS — R519 Headache, unspecified: Secondary | ICD-10-CM

## 2021-12-07 DIAGNOSIS — M79662 Pain in left lower leg: Secondary | ICD-10-CM

## 2021-12-07 DIAGNOSIS — M25461 Effusion, right knee: Secondary | ICD-10-CM | POA: Diagnosis not present

## 2021-12-07 DIAGNOSIS — I10 Essential (primary) hypertension: Secondary | ICD-10-CM | POA: Diagnosis not present

## 2021-12-07 DIAGNOSIS — W19XXXA Unspecified fall, initial encounter: Secondary | ICD-10-CM | POA: Diagnosis not present

## 2021-12-07 DIAGNOSIS — M79604 Pain in right leg: Secondary | ICD-10-CM

## 2021-12-07 DIAGNOSIS — M25561 Pain in right knee: Secondary | ICD-10-CM

## 2021-12-07 DIAGNOSIS — R600 Localized edema: Secondary | ICD-10-CM | POA: Diagnosis not present

## 2021-12-07 NOTE — ED Notes (Signed)
Provider recommended patient go to ED for further management of elevated blood pressure and head CT to rule out any life threatening condition. Pt refused at this time. Provider explained risks of refusal. Patient voiced understanding, AMA form signed.

## 2021-12-07 NOTE — ED Triage Notes (Addendum)
Pt reports she fell in church on Sunday. States since the fall, she has had headaches, neck pain, right leg pain and right knee swelling.  States have tried tylenol, biofreeze, and alternating head and ice for knee pain. Denies hitting head and LOC.

## 2021-12-07 NOTE — Discharge Instructions (Addendum)
-  Your x-rays do not show any fractures.  However, as we discussed, x-ray of your neck does not completely rule out a fracture of your neck and the best test would be a CT.  We also discussed that a CT would be indicated given the headache and very high blood pressure after a fall.  Cannot completely rule out an intracranial bleed.  You are aware of the risks and dangers of not having a CT done today and did sign an [place] form. - Ice the knee and elevate it.  Continue with Tylenol for pain relief. - Contact your doctor tomorrow for appointment in regards to your blood pressure being so elevated and for recheck of your multiple complaints/injuries. - You may need to see Ortho again to have fluid drawn off the knee if the swelling does not go down.  Wear the brace.  It should be helpful. - If you have any acute worsening of your pain, confusion, vomiting, numbness/tingling or weakness of extremities, balance problems or worsening of your headache, vision changes, please call 911. - Keep a log of your blood pressure at home and discuss your readings with your doctor.

## 2021-12-07 NOTE — ED Provider Notes (Signed)
[facility]    CSN: [id number] Arrival date & time: 12/07/21  1553      History   Chief Complaint Chief Complaint  Patient presents with   Fall    HPI Kim Ballard is a 87 y.o. female presenting with multiple complaints.  First patient states that she fell 2 days ago while at church after tripping on a rug.  She says that she fell onto her right knee and it is very swollen and painful.  She reports that she already had bursitis in this knee but now she is having 10 out of 10 pain in this knee and a lot of pain on weightbearing.  She is afraid she potentially fractured the knee.  She also reports pain and swelling of her left tib-fib.  She says she thinks she twisted this leg when she fell.  Additionally reports pain in her neck and posterior head/occipital region as well as frontal aspect of her head.  She reports that her headache is moderate.  She says she has had worse headaches.  She does not know if she hit her head or not when she fell.  She does deny loss of consciousness.  She has not had any confusion, weakness, vomiting, vision changes.  Has been taking Tylenol for the knee pain but says it has not helped.  Patient reports a history of poorly managed hypertension.  States she takes HCTZ for her blood pressure.  BP in clinic today is 205/90.  Recheck is about the same.  Patient is denying any numbness, tingling or weakness.  No facial numbness or drooping.  No history of stroke or heart attack.  Patient's medical history includes anemia, diabetes, fibromyalgia, hypertension, GERD and history of melanoma.  HPI  Past Medical History:  Diagnosis Date   Allergic state    Anemia    Arthritis    Cancer ([place])    skin cancers   Chronic cystitis with hematuria    Depression    Diabetes mellitus without complication ([place])    Fibrocystic disease of both breasts    Fibromyalgia    GERD (gastroesophageal reflux disease)    Hypertension    IBS (irritable bowel syndrome)     Melanoma ([place]) 07/09/2021   lt neck/shoulder area   Melanoma of neck ([place]) 09/06/2021   left   Nausea    Skin cancer    Wears dentures    full upper and lower    Patient Active Problem List   Diagnosis Date Noted   Swelling of left lower extremity 11/19/2021   Iron deficiency 08/13/2019   Recurrent UTI 07/01/2019   Fatigue 09/18/2017   Leukopenia 04/26/2016   Pancytopenia ([place]) 04/26/2016   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   Hyperlipidemia 02/19/2016   Diabetes mellitus ([place]) 02/19/2016   Iron deficiency anemia 02/08/2016   Anemia 01/18/2016   Thrombocytopenia ([place]) 01/18/2016    Past Surgical History:  Procedure Laterality Date   ABDOMINAL HYSTERECTOMY     APPENDECTOMY     BREAST BIOPSY Right 2001   surgical bx    CATARACT EXTRACTION W/PHACO Right 03/08/2017   Procedure: CATARACT EXTRACTION PHACO AND INTRAOCULAR LENS PLACEMENT ([place]) RIGHT DIABETIC TORIC;  Surgeon: Leandrew Koyanagi, MD;  Location: [facility];  Service: Ophthalmology;  Laterality: Right;  Diabetic - oral meds   CATARACT EXTRACTION W/PHACO Left 04/17/2017   Procedure: CATARACT EXTRACTION PHACO AND INTRAOCULAR LENS PLACEMENT ([place]) LEFT DIABETIC;  Surgeon: Leandrew Koyanagi, MD;  Location: [facility];  Service: Ophthalmology;  Laterality: Left;  diabetic-oral meds   CHOLECYSTECTOMY     COLONOSCOPY     COLONOSCOPY WITH PROPOFOL N/A 08/27/2015   Procedure: COLONOSCOPY WITH PROPOFOL;  Surgeon: Manya Silvas, MD;  Location: [facility] ENDOSCOPY;  Service: Endoscopy;  Laterality: N/A;   COLONOSCOPY WITH PROPOFOL N/A 05/02/2016   Procedure: COLONOSCOPY WITH PROPOFOL;  Surgeon: Manya Silvas, MD;  Location: [facility] ENDOSCOPY;  Service: Endoscopy;  Laterality: N/A;   ESOPHAGOGASTRODUODENOSCOPY     ESOPHAGOGASTRODUODENOSCOPY (EGD) WITH PROPOFOL N/A 08/27/2015   Procedure: ESOPHAGOGASTRODUODENOSCOPY (EGD) WITH PROPOFOL;  Surgeon: Manya Silvas, MD;  Location: [facility] ENDOSCOPY;  Service:  Endoscopy;  Laterality: N/A;   HERNIA REPAIR      OB History   No obstetric history on file.      Home Medications    Prior to Admission medications   Medication Sig Start Date End Date Taking? Authorizing Provider  Ascorbic Acid (VITAMIN C) 1000 MG tablet Take 1,000 mg by mouth 2 times daily at 12 noon and 4 pm.    [provider]  cholecalciferol (VITAMIN D3) 25 MCG (1000 UT) tablet Take 1,000 Units by mouth 2 (two) times daily.    [provider]  clidinium-chlordiazePOXIDE (LIBRAX) 5-2.5 MG capsule Take 1 capsule by mouth.    [provider]  Cranberry 405 MG CAPS Take 2 capsules by mouth 2 (two) times daily.     [provider]  FERREX 150 150 MG capsule  05/09/19   [provider]  fluticasone (FLONASE) 50 MCG/ACT nasal spray Place into both nostrils daily.    [provider]  gabapentin (NEURONTIN) 100 MG capsule Take 1 capsule by mouth daily. 10/31/19 03/23/23  [provider]  gemfibrozil (LOPID) 600 MG tablet Take 600 mg by mouth 2 (two) times daily before a meal.    [provider]  glipiZIDE (GLUCOTROL XL) 5 MG 24 hr tablet Take 1 tablet by mouth daily. 10/08/19   [provider]  hydrochlorothiazide (HYDRODIURIL) 25 MG tablet Take 25 mg by mouth daily.    [provider]  Lactobacillus Rhamnosus, GG, ([employer] PROBIOTIC, LACTOBACILLUS, PO) Take by mouth.    [provider]  metFORMIN (GLUCOPHAGE) 500 MG tablet Take 1,000 mg by mouth 2 (two) times daily with a meal.     [provider]  Multiple Vitamins-Minerals (MULTIVITAMIN WITH MINERALS) tablet Take 1 tablet by mouth daily.    [provider]  OMEGA-3 FATTY ACIDS PO Take by mouth.    [provider]  pantoprazole (PROTONIX) 40 MG tablet Take 40 mg by mouth daily.    [provider]  vitamin B-12 (CYANOCOBALAMIN) 1000 MCG tablet Take 1,000 mcg by mouth daily.    [provider]  vitamin E  100 UNIT capsule Take by mouth daily.    [provider]    Family History Family History  Problem Relation Age of Onset   Hypertension Mother    Ovarian cancer Paternal Grandmother    COPD Father    Hyperlipidemia Father    Kidney disease Father    Diabetes Maternal Grandmother    Breast cancer Neg Hx     Social History Social History   Tobacco Use   Smoking status: Former    Types: Cigarettes    Quit date: 1960    Years since quitting: 63.5   Smokeless tobacco: Never  Vaping Use   Vaping Use: Never used  Substance Use Topics   Alcohol use: No  Drug use: No     Allergies   Biaxin [clarithromycin], Brompheniramine maleate, Ceclor [cefaclor], Ciprofloxacin, Clinoril [sulindac], Codeine sulfate, Diovan [valsartan], Doxycycline, Erythromycin, Esomeprazole, Fluoxetine, Guaifenesin & derivatives, Levaquin [levofloxacin in d5w], Lisinopril, Lodine [etodolac], Macrodantin [nitrofurantoin macrocrystal], Medrol [methylprednisolone], Mobic [meloxicam], Motrin [ibuprofen], Nexium [esomeprazole magnesium], Nitrofurantoin, Norvasc [amlodipine besylate], Omnicef [cefdinir], Penicillins, Prozac [fluoxetine hcl], Pseudoephedrine, Reglan [metoclopramide], Seldane [terfenadine], Sulfa antibiotics, Toprol xl [metoprolol tartrate], Triamterene, Tussionex pennkinetic er [hydrocod poli-chlorphe poli er], Vibramycin [doxycycline calcium], and Amlodipine   Review of Systems Review of Systems  Constitutional:  Negative for fatigue and fever.  Eyes:  Negative for photophobia and visual disturbance.  Respiratory:  Negative for shortness of breath.   Cardiovascular:  Negative for chest pain.  Gastrointestinal:  Negative for abdominal pain, nausea and vomiting.  Musculoskeletal:  Positive for arthralgias, gait problem, joint swelling and neck pain. Negative for back pain and neck stiffness.  Skin:  Positive for color change. Negative for wound.  Neurological:  Positive for headaches.  Negative for dizziness, tremors, seizures, syncope, facial asymmetry, speech difficulty, weakness, light-headedness and numbness.  Hematological:  Bruises/bleeds easily (history of anemia).  Psychiatric/Behavioral:  Negative for confusion.      Physical Exam Triage Vital Signs ED Triage Vitals [12/07/21 1606]  Enc Vitals Group     BP      Pulse      Resp      Temp      Temp src      SpO2      Weight      Height      Head Circumference      Peak Flow      Pain Score 10     Pain Loc      Pain Edu?      Excl. in [place]?    No data found.  Updated Vital Signs BP (!) 197/75   Pulse 72   Temp 99.5 F (37.5 C) (Oral)   Resp 20   SpO2 97%      Physical Exam Vitals and nursing note reviewed.  Constitutional:      General: She is not in acute distress.    Appearance: Normal appearance. She is not ill-appearing or toxic-appearing.  HENT:     Head: Normocephalic and atraumatic.     Comments: No swelling, ecchymosis    Right Ear: Tympanic membrane, ear canal and external ear normal.     Left Ear: Tympanic membrane, ear canal and external ear normal.     Nose: Nose normal.     Mouth/Throat:     Mouth: Mucous membranes are moist.     Pharynx: Oropharynx is clear.  Eyes:     General: No scleral icterus.       Right eye: No discharge.        Left eye: No discharge.     Extraocular Movements: Extraocular movements intact.     Conjunctiva/sclera: Conjunctivae normal.     Pupils: Pupils are equal, round, and reactive to light.  Cardiovascular:     Rate and Rhythm: Normal rate and regular rhythm.     Heart sounds: Normal heart sounds.  Pulmonary:     Effort: Pulmonary effort is normal. No respiratory distress.     Breath sounds: Normal breath sounds.  Musculoskeletal:     Cervical back: Neck supple. Tenderness (diffuse TTP of entire cspine and bilateral paracervical muscles) present. Pain with movement present. Decreased range of motion.     Comments: Right knee: Moderate  swelling of  right anterior knee.  Reduced range of motion beyond 90 degrees of flexion.  Unable to fully extend knee due to pain.  Tenderness palpation diffusely throughout the anterior knee and patella.  Left lower leg: Swelling over the tib-fib and tenderness palpation throughout the middle of the tib-fib.  Full range of motion of the left knee without tenderness.  Full range of motion of ankle.  Skin:    General: Skin is dry.  Neurological:     General: No focal deficit present.     Mental Status: She is alert and oriented to person, place, and time. Mental status is at baseline.     Cranial Nerves: No cranial nerve deficit.     Motor: No weakness.     Coordination: Coordination normal.     Gait: Gait abnormal.     Comments: Full strength of bilat upper and lower exts  Psychiatric:        Mood and Affect: Mood normal.        Behavior: Behavior normal.        Thought Content: Thought content normal.      UC Treatments / Results  Labs (all labs ordered are listed, but only abnormal results are displayed) Labs Reviewed - No data to display  EKG   Radiology DG Tibia/Fibula Left  Result Date: 12/07/2021 CLINICAL DATA:  Fall 2 days ago.  Lower extremity pain. EXAM: LEFT TIBIA AND FIBULA - 2 VIEW COMPARISON:  None Available. FINDINGS: The cortical margins of the tibia and fibula are intact. There is no evidence of fracture or other focal bone lesions. Ankle and knee alignment are maintained. Mild generalized soft tissue edema. IMPRESSION: Mild soft tissue edema. No fracture. Electronically Signed   By: Keith Rake M.D.   On: 12/07/2021 17:52   DG Cervical Spine Complete  Result Date: 12/07/2021 CLINICAL DATA:  Fall 2 days ago.  Neck pain. EXAM: CERVICAL SPINE - COMPLETE 4+ VIEW COMPARISON:  Soft tissue neck CT 11/04/2021 FINDINGS: The bones are diffusely under mineralized. No radiographic evidence of acute fracture. Normal alignment. Mild multilevel degenerative disc disease and  facet hypertrophy. Lateral masses of C1 are well aligned on C2. No prevertebral soft tissue thickening. IMPRESSION: 1. No radiographic evidence of acute fracture or subluxation of the cervical spine. If there is persistent clinical concern for fracture, recommend further assessment with CT. 2. Multilevel degenerative disc disease and moderate facet hypertrophy. Electronically Signed   By: Keith Rake M.D.   On: 12/07/2021 17:29   DG Knee Complete 4 Views Right  Result Date: 12/07/2021 CLINICAL DATA:  Fall 2 days ago.  Right leg pain. EXAM: RIGHT KNEE - COMPLETE 4+ VIEW COMPARISON:  None Available. FINDINGS: The bones are under mineralized. No evidence of acute fracture or dislocation. There is a moderate joint effusion without definite lipohemarthrosis. Prepatellar soft tissue edema. Mild tricompartmental peripheral spurring and spurring of the tibial spines. Moderate chondrocalcinosis. IMPRESSION: 1. No fracture or dislocation of the right knee. 2. Moderate joint effusion and prepatellar soft tissue edema. Electronically Signed   By: Keith Rake M.D.   On: 12/07/2021 17:26    Procedures Procedures (including critical care time)  Medications Ordered in UC Medications - No data to display  Initial Impression / Assessment and Plan / UC Course  I have reviewed the triage vital signs and the nursing notes.  Pertinent labs & imaging results that were available during my care of the patient were reviewed by me and considered in my medical decision  making (see chart for details).  87 year old female presenting for multiple injuries following an accidental fall 2 days ago when she tripped on a carpet at church.  1.  Right knee pain and swelling: X-ray obtained today shows no acute fracture or dislocation.  Moderate effusion of joint and prepatellar soft tissue edema.  Patient given supportive knee brace.  Reviewed RICE guidelines.  Reviewed Tylenol for pain relief.  Advised contacting PCP and/or  Ortho regarding knee pain if it continues that she may need to have joint aspiration.  2.  Left leg pain and swelling: X-ray obtained today shows soft tissue swelling only.  3.  Headache: Patient with headache after fall.  Unsure of head injury.  Neurological exam is normal, normal cranial nerve exam.  5 out of 5 strength bilateral upper and lower extremities.  Patient is alert and oriented x3.  I did discuss with patient that I am very concerned since her blood pressure is so significantly elevated and she has a associated headache after the fall.  Discussed with her that it would be most advised for her to have a CT scan of her head to rule out intracranial abnormality/bleed.  Patient is very averse to this idea and does not want to go to the ER.  She wants to sign an AMA and call her primary care provider tomorrow.  Advised her of the risks of putting this off.  Advised that if she did have a bleed it could significantly worsen overnight and lead to her death.  She is understanding and says her headache is not her primary concern.  Advised calling 911 if anything acutely worsens.  4.  Severe uncontrolled hypertension: Blood pressure elevated at 205/90.  Recheck is the same.  Appears to be uncontrolled for a while.  Blood pressure checked a couple weeks ago she was 188/78.  Advised patient to check blood pressure at home and keep a log to follow-up with her primary care provider.  She likely needs something more than just HCTZ. Continue home HCTZ and other meds.  If any associated chest pain, breathing difficulty, palpitations or weakness, advised call 911.     Final Clinical Impressions(s) / UC Diagnoses   Final diagnoses:  Pain and swelling of right knee  Pain in left lower leg  Fall, initial encounter  Uncontrolled hypertension  Acute nonintractable headache, unspecified headache type  Neck pain     Discharge Instructions      -Your x-rays do not show any fractures.  However, as we  discussed, x-ray of your neck does not completely rule out a fracture of your neck and the best test would be a CT.  We also discussed that a CT would be indicated given the headache and very high blood pressure after a fall.  Cannot completely rule out an intracranial bleed.  You are aware of the risks and dangers of not having a CT done today and did sign an [place] form. - Ice the knee and elevate it.  Continue with Tylenol for pain relief. - Contact your doctor tomorrow for appointment in regards to your blood pressure being so elevated and for recheck of your multiple complaints/injuries. - You may need to see Ortho again to have fluid drawn off the knee if the swelling does not go down.  Wear the brace.  It should be helpful. - If you have any acute worsening of your pain, confusion, vomiting, numbness/tingling or weakness of extremities, balance problems or worsening of your  headache, vision changes, please call 911. - Keep a log of your blood pressure at home and discuss your readings with your doctor.        ED Prescriptions   None    PDMP not reviewed this encounter.   Danton Clap, PA-C 12/07/21 1823

## 2021-12-08 DIAGNOSIS — C434 Malignant melanoma of scalp and neck: Secondary | ICD-10-CM | POA: Diagnosis not present

## 2021-12-08 DIAGNOSIS — Z9181 History of falling: Secondary | ICD-10-CM | POA: Diagnosis not present

## 2021-12-08 DIAGNOSIS — M25461 Effusion, right knee: Secondary | ICD-10-CM | POA: Diagnosis not present

## 2021-12-08 DIAGNOSIS — I10 Essential (primary) hypertension: Secondary | ICD-10-CM | POA: Diagnosis not present

## 2021-12-08 DIAGNOSIS — D649 Anemia, unspecified: Secondary | ICD-10-CM | POA: Diagnosis not present

## 2021-12-10 DIAGNOSIS — D040 Carcinoma in situ of skin of lip: Secondary | ICD-10-CM | POA: Diagnosis not present

## 2021-12-10 DIAGNOSIS — D225 Melanocytic nevi of trunk: Secondary | ICD-10-CM | POA: Diagnosis not present

## 2021-12-10 DIAGNOSIS — D2261 Melanocytic nevi of right upper limb, including shoulder: Secondary | ICD-10-CM | POA: Diagnosis not present

## 2021-12-10 DIAGNOSIS — Z86006 Personal history of melanoma in-situ: Secondary | ICD-10-CM | POA: Diagnosis not present

## 2021-12-10 DIAGNOSIS — L820 Inflamed seborrheic keratosis: Secondary | ICD-10-CM | POA: Diagnosis not present

## 2021-12-10 DIAGNOSIS — C44612 Basal cell carcinoma of skin of right upper limb, including shoulder: Secondary | ICD-10-CM | POA: Diagnosis not present

## 2021-12-10 DIAGNOSIS — R208 Other disturbances of skin sensation: Secondary | ICD-10-CM | POA: Diagnosis not present

## 2021-12-10 DIAGNOSIS — L570 Actinic keratosis: Secondary | ICD-10-CM | POA: Diagnosis not present

## 2021-12-10 DIAGNOSIS — D2262 Melanocytic nevi of left upper limb, including shoulder: Secondary | ICD-10-CM | POA: Diagnosis not present

## 2021-12-10 DIAGNOSIS — D485 Neoplasm of uncertain behavior of skin: Secondary | ICD-10-CM | POA: Diagnosis not present

## 2021-12-13 DIAGNOSIS — M531 Cervicobrachial syndrome: Secondary | ICD-10-CM | POA: Diagnosis not present

## 2021-12-13 DIAGNOSIS — M9901 Segmental and somatic dysfunction of cervical region: Secondary | ICD-10-CM | POA: Diagnosis not present

## 2021-12-13 DIAGNOSIS — M542 Cervicalgia: Secondary | ICD-10-CM | POA: Diagnosis not present

## 2021-12-17 DIAGNOSIS — M542 Cervicalgia: Secondary | ICD-10-CM | POA: Diagnosis not present

## 2021-12-17 DIAGNOSIS — M9901 Segmental and somatic dysfunction of cervical region: Secondary | ICD-10-CM | POA: Diagnosis not present

## 2021-12-17 DIAGNOSIS — M531 Cervicobrachial syndrome: Secondary | ICD-10-CM | POA: Diagnosis not present

## 2021-12-20 DIAGNOSIS — M542 Cervicalgia: Secondary | ICD-10-CM | POA: Diagnosis not present

## 2021-12-20 DIAGNOSIS — M531 Cervicobrachial syndrome: Secondary | ICD-10-CM | POA: Diagnosis not present

## 2021-12-20 DIAGNOSIS — M9901 Segmental and somatic dysfunction of cervical region: Secondary | ICD-10-CM | POA: Diagnosis not present

## 2021-12-24 DIAGNOSIS — M545 Low back pain, unspecified: Secondary | ICD-10-CM | POA: Diagnosis not present

## 2021-12-24 DIAGNOSIS — M9933 Osseous stenosis of neural canal of lumbar region: Secondary | ICD-10-CM | POA: Diagnosis not present

## 2021-12-24 DIAGNOSIS — S8012XA Contusion of left lower leg, initial encounter: Secondary | ICD-10-CM | POA: Diagnosis not present

## 2021-12-24 DIAGNOSIS — M533 Sacrococcygeal disorders, not elsewhere classified: Secondary | ICD-10-CM | POA: Diagnosis not present

## 2021-12-24 DIAGNOSIS — M47818 Spondylosis without myelopathy or radiculopathy, sacral and sacrococcygeal region: Secondary | ICD-10-CM | POA: Diagnosis not present

## 2021-12-24 DIAGNOSIS — M1711 Unilateral primary osteoarthritis, right knee: Secondary | ICD-10-CM | POA: Diagnosis not present

## 2021-12-27 DIAGNOSIS — M9901 Segmental and somatic dysfunction of cervical region: Secondary | ICD-10-CM | POA: Diagnosis not present

## 2021-12-27 DIAGNOSIS — M542 Cervicalgia: Secondary | ICD-10-CM | POA: Diagnosis not present

## 2021-12-27 DIAGNOSIS — M531 Cervicobrachial syndrome: Secondary | ICD-10-CM | POA: Diagnosis not present

## 2021-12-28 DIAGNOSIS — M9901 Segmental and somatic dysfunction of cervical region: Secondary | ICD-10-CM | POA: Diagnosis not present

## 2021-12-28 DIAGNOSIS — M531 Cervicobrachial syndrome: Secondary | ICD-10-CM | POA: Diagnosis not present

## 2021-12-28 DIAGNOSIS — M542 Cervicalgia: Secondary | ICD-10-CM | POA: Diagnosis not present

## 2021-12-29 DIAGNOSIS — M8588 Other specified disorders of bone density and structure, other site: Secondary | ICD-10-CM | POA: Diagnosis not present

## 2021-12-29 DIAGNOSIS — M545 Low back pain, unspecified: Secondary | ICD-10-CM | POA: Diagnosis not present

## 2021-12-29 DIAGNOSIS — M5416 Radiculopathy, lumbar region: Secondary | ICD-10-CM | POA: Diagnosis not present

## 2021-12-29 DIAGNOSIS — M5136 Other intervertebral disc degeneration, lumbar region: Secondary | ICD-10-CM | POA: Diagnosis not present

## 2021-12-30 DIAGNOSIS — L814 Other melanin hyperpigmentation: Secondary | ICD-10-CM | POA: Diagnosis not present

## 2021-12-30 DIAGNOSIS — L988 Other specified disorders of the skin and subcutaneous tissue: Secondary | ICD-10-CM | POA: Diagnosis not present

## 2021-12-30 DIAGNOSIS — D040 Carcinoma in situ of skin of lip: Secondary | ICD-10-CM | POA: Diagnosis not present

## 2021-12-30 DIAGNOSIS — L578 Other skin changes due to chronic exposure to nonionizing radiation: Secondary | ICD-10-CM | POA: Diagnosis not present

## 2022-01-07 DIAGNOSIS — M5416 Radiculopathy, lumbar region: Secondary | ICD-10-CM | POA: Diagnosis not present

## 2022-01-07 DIAGNOSIS — M48062 Spinal stenosis, lumbar region with neurogenic claudication: Secondary | ICD-10-CM | POA: Diagnosis not present

## 2022-02-14 DIAGNOSIS — M5136 Other intervertebral disc degeneration, lumbar region: Secondary | ICD-10-CM | POA: Diagnosis not present

## 2022-02-14 DIAGNOSIS — M5416 Radiculopathy, lumbar region: Secondary | ICD-10-CM | POA: Diagnosis not present

## 2022-02-14 DIAGNOSIS — M48061 Spinal stenosis, lumbar region without neurogenic claudication: Secondary | ICD-10-CM | POA: Diagnosis not present

## 2022-02-14 DIAGNOSIS — M5126 Other intervertebral disc displacement, lumbar region: Secondary | ICD-10-CM | POA: Diagnosis not present

## 2022-02-14 DIAGNOSIS — C44612 Basal cell carcinoma of skin of right upper limb, including shoulder: Secondary | ICD-10-CM | POA: Diagnosis not present

## 2022-02-22 DIAGNOSIS — R600 Localized edema: Secondary | ICD-10-CM | POA: Diagnosis not present

## 2022-02-22 DIAGNOSIS — I10 Essential (primary) hypertension: Secondary | ICD-10-CM | POA: Diagnosis not present

## 2022-02-22 DIAGNOSIS — M25552 Pain in left hip: Secondary | ICD-10-CM | POA: Diagnosis not present

## 2022-02-22 DIAGNOSIS — R739 Hyperglycemia, unspecified: Secondary | ICD-10-CM | POA: Diagnosis not present

## 2022-02-23 ENCOUNTER — Ambulatory Visit
Admission: RE | Admit: 2022-02-23 | Discharge: 2022-02-23 | Disposition: A | Discharge status: Home / Self Care | Payer: Medicare HMO | Source: Ambulatory Visit | Attending: Internal Medicine | Admitting: Internal Medicine

## 2022-02-23 DIAGNOSIS — M79662 Pain in left lower leg: Secondary | ICD-10-CM | POA: Diagnosis not present

## 2022-02-23 DIAGNOSIS — M7989 Other specified soft tissue disorders: Secondary | ICD-10-CM | POA: Insufficient documentation

## 2022-02-24 DIAGNOSIS — D37030 Neoplasm of uncertain behavior of the parotid salivary glands: Secondary | ICD-10-CM | POA: Diagnosis not present

## 2022-02-24 DIAGNOSIS — H6121 Impacted cerumen, right ear: Secondary | ICD-10-CM | POA: Diagnosis not present

## 2022-03-03 DIAGNOSIS — N390 Urinary tract infection, site not specified: Secondary | ICD-10-CM | POA: Diagnosis not present

## 2022-03-03 DIAGNOSIS — D649 Anemia, unspecified: Secondary | ICD-10-CM | POA: Diagnosis not present

## 2022-03-03 DIAGNOSIS — C434 Malignant melanoma of scalp and neck: Secondary | ICD-10-CM | POA: Diagnosis not present

## 2022-03-03 DIAGNOSIS — F330 Major depressive disorder, recurrent, mild: Secondary | ICD-10-CM | POA: Diagnosis not present

## 2022-03-03 DIAGNOSIS — R220 Localized swelling, mass and lump, head: Secondary | ICD-10-CM | POA: Diagnosis not present

## 2022-03-03 DIAGNOSIS — R5383 Other fatigue: Secondary | ICD-10-CM | POA: Diagnosis not present

## 2022-03-03 DIAGNOSIS — I10 Essential (primary) hypertension: Secondary | ICD-10-CM | POA: Diagnosis not present

## 2022-03-03 DIAGNOSIS — D696 Thrombocytopenia, unspecified: Secondary | ICD-10-CM | POA: Diagnosis not present

## 2022-03-03 DIAGNOSIS — R8290 Unspecified abnormal findings in urine: Secondary | ICD-10-CM | POA: Diagnosis not present

## 2022-03-03 DIAGNOSIS — E1165 Type 2 diabetes mellitus with hyperglycemia: Secondary | ICD-10-CM | POA: Diagnosis not present

## 2022-03-04 DIAGNOSIS — M5126 Other intervertebral disc displacement, lumbar region: Secondary | ICD-10-CM | POA: Diagnosis not present

## 2022-03-04 DIAGNOSIS — M5416 Radiculopathy, lumbar region: Secondary | ICD-10-CM | POA: Diagnosis not present

## 2022-03-10 DIAGNOSIS — R600 Localized edema: Secondary | ICD-10-CM | POA: Diagnosis not present

## 2022-03-10 DIAGNOSIS — R140 Abdominal distension (gaseous): Secondary | ICD-10-CM | POA: Diagnosis not present

## 2022-03-10 DIAGNOSIS — D649 Anemia, unspecified: Secondary | ICD-10-CM | POA: Diagnosis not present

## 2022-03-10 DIAGNOSIS — E1165 Type 2 diabetes mellitus with hyperglycemia: Secondary | ICD-10-CM | POA: Diagnosis not present

## 2022-03-10 DIAGNOSIS — I10 Essential (primary) hypertension: Secondary | ICD-10-CM | POA: Diagnosis not present

## 2022-03-10 DIAGNOSIS — Z1212 Encounter for screening for malignant neoplasm of rectum: Secondary | ICD-10-CM | POA: Diagnosis not present

## 2022-03-10 DIAGNOSIS — Z1211 Encounter for screening for malignant neoplasm of colon: Secondary | ICD-10-CM | POA: Diagnosis not present

## 2022-03-16 ENCOUNTER — Other Ambulatory Visit: Payer: Self-pay | Admitting: Internal Medicine

## 2022-03-16 DIAGNOSIS — R1032 Left lower quadrant pain: Secondary | ICD-10-CM

## 2022-03-16 DIAGNOSIS — R600 Localized edema: Secondary | ICD-10-CM

## 2022-03-17 DIAGNOSIS — R399 Unspecified symptoms and signs involving the genitourinary system: Secondary | ICD-10-CM | POA: Diagnosis not present

## 2022-03-17 DIAGNOSIS — R8290 Unspecified abnormal findings in urine: Secondary | ICD-10-CM | POA: Diagnosis not present

## 2022-03-17 DIAGNOSIS — D649 Anemia, unspecified: Secondary | ICD-10-CM | POA: Diagnosis not present

## 2022-03-18 DIAGNOSIS — K921 Melena: Secondary | ICD-10-CM | POA: Diagnosis not present

## 2022-03-18 DIAGNOSIS — Z1212 Encounter for screening for malignant neoplasm of rectum: Secondary | ICD-10-CM | POA: Diagnosis not present

## 2022-03-18 DIAGNOSIS — Z1211 Encounter for screening for malignant neoplasm of colon: Secondary | ICD-10-CM | POA: Diagnosis not present

## 2022-03-25 ENCOUNTER — Inpatient Hospital Stay: Payer: Medicare HMO

## 2022-03-25 ENCOUNTER — Inpatient Hospital Stay: Payer: Medicare HMO | Attending: Internal Medicine | Admitting: Internal Medicine

## 2022-03-25 ENCOUNTER — Encounter: Payer: Self-pay | Admitting: Internal Medicine

## 2022-03-25 VITALS — BP 173/76 | HR 70

## 2022-03-25 DIAGNOSIS — D696 Thrombocytopenia, unspecified: Secondary | ICD-10-CM

## 2022-03-25 DIAGNOSIS — D509 Iron deficiency anemia, unspecified: Secondary | ICD-10-CM | POA: Insufficient documentation

## 2022-03-25 DIAGNOSIS — D508 Other iron deficiency anemias: Secondary | ICD-10-CM

## 2022-03-25 LAB — CBC WITH DIFFERENTIAL/PLATELET
Abs Immature Granulocytes: 0.02 10*3/uL (ref 0.00–0.07)
Basophils Absolute: 0 10*3/uL (ref 0.0–0.1)
Basophils Relative: 1 %
Eosinophils Absolute: 0.1 10*3/uL (ref 0.0–0.5)
Eosinophils Relative: 4 %
HCT: 30.5 % — ABNORMAL LOW (ref 36.0–46.0)
Hemoglobin: 10 g/dL — ABNORMAL LOW (ref 12.0–15.0)
Immature Granulocytes: 1 %
Lymphocytes Relative: 22 %
Lymphs Abs: 0.7 10*3/uL (ref 0.7–4.0)
MCH: 31.4 pg (ref 26.0–34.0)
MCHC: 32.8 g/dL (ref 30.0–36.0)
MCV: 95.9 fL (ref 80.0–100.0)
Monocytes Absolute: 0.3 10*3/uL (ref 0.1–1.0)
Monocytes Relative: 9 %
Neutro Abs: 2.1 10*3/uL (ref 1.7–7.7)
Neutrophils Relative %: 63 %
Platelets: 129 10*3/uL — ABNORMAL LOW (ref 150–400)
RBC: 3.18 MIL/uL — ABNORMAL LOW (ref 3.87–5.11)
RDW: 13 % (ref 11.5–15.5)
WBC: 3.4 10*3/uL — ABNORMAL LOW (ref 4.0–10.5)
nRBC: 0 % (ref 0.0–0.2)

## 2022-03-25 LAB — BASIC METABOLIC PANEL
Anion gap: 7 (ref 5–15)
BUN: 34 mg/dL — ABNORMAL HIGH (ref 8–23)
CO2: 26 mmol/L (ref 22–32)
Calcium: 9.3 mg/dL (ref 8.9–10.3)
Chloride: 107 mmol/L (ref 98–111)
Creatinine, Ser: 1.18 mg/dL — ABNORMAL HIGH (ref 0.44–1.00)
GFR, Estimated: 46 mL/min — ABNORMAL LOW (ref >60)
Glucose, Bld: 259 mg/dL — ABNORMAL HIGH (ref 70–99)
Potassium: 4.2 mmol/L (ref 3.5–5.1)
Sodium: 140 mmol/L (ref 135–145)

## 2022-03-25 LAB — COLOGUARD: COLOGUARD: POSITIVE — AB

## 2022-03-25 LAB — IRON AND TIBC
Iron: 75 ug/dL (ref 28–170)
Saturation Ratios: 18 % (ref 10.4–31.8)
TIBC: 414 ug/dL (ref 250–450)
UIBC: 339 ug/dL

## 2022-03-25 LAB — FERRITIN: Ferritin: 135 ng/mL (ref 11–307)

## 2022-03-25 MED ORDER — SODIUM CHLORIDE 0.9 % IV SOLN
200.0000 mg | Freq: Once | INTRAVENOUS | Status: AC
  Administered 2022-03-25: 200 mg via INTRAVENOUS
  Filled 2022-03-25: qty 200

## 2022-03-25 MED ORDER — SODIUM CHLORIDE 0.9 % IV SOLN
Freq: Once | INTRAVENOUS | Status: AC
  Filled 2022-03-25: qty 250

## 2022-03-25 NOTE — Assessment & Plan Note (Addendum)
#  Chronic mild anemia-hemoglobin  10-11; JUNE 2023--Iron sat-18%; Ferritin- 409; Proceed with Venofer today.STABLE.  # ITP/ Thrombocytopenia-platelet count-130-150-STABLE. ; on surveillance.  # Elevated HTN- keep a log BPs; Repeat 170/90.  And defer to PCP, Dr.Hande.   # Fatigue- ? Insomnia- recommend melatonin '1mg'$ /day.   #Left lower extrimity swelling-question etiology.  NEG- Dopplers; keep legs elevated. Awaiting ? CT scan- with Dr.Hande.   # DISPOSITION: #  Venofer today # venofer weekly x2  # follow up in 4 months MD: labs- cbc/bmp/iron studies/ferritin--possible Venofer-Dr.B

## 2022-03-25 NOTE — Progress Notes (Signed)
Patient states her blood is low.

## 2022-03-25 NOTE — Patient Instructions (Signed)

## 2022-03-25 NOTE — Progress Notes (Signed)
[facility] CONSULT NOTE  Patient Care Team: Tracie Harrier, MD as PCP - General (Internal Medicine) Tracie Harrier, MD as Physician Assistant (Internal Medicine) Lequita Asal, MD (Inactive) as Consulting Physician (Hematology and Oncology) Cammie Sickle, MD as Consulting Physician (Oncology)  CHIEF COMPLAINTS/PURPOSE OF CONSULTATION: Iron deficiency anemia/thrombocytopenia  #Chronic [2015] mild anemia hemoglobin 11.1-question iron deficiency versus others.-Dr. Elliott/Dr. Mike Gip; s/p Venofer.   #Mild thrombocytopenia platelets 120s  #Fatigue/ PN/DM; MELANOMA of mid back [May 2021] s/p excision. MELANOMA LEFT neck posterior-s/p excision.Kiara.Shaver 2023 ]  Oncology History   No history exists.    HISTORY OF PRESENTING ILLNESS: Alone.  Ambulating independently.  Kim Ballard 87 y.o.  female longstanding history of iron deficient anemia/thrombocytopenia is here for follow-up.  Patient states her blood is low when recently checked with her PCP approximately month ago.   Patient also complains of left lower extremity swelling with cramping.  No swelling on the right leg.  July 2023 ultrasound negative.  She is awaiting imaging with PCP/orthopedics.  Complains of ongoing fatigue.  No fever no chills.  No nausea or vomiting.  No blood in stools or black or stools.  Review of Systems  Constitutional:  Positive for malaise/fatigue. Negative for chills, diaphoresis, fever and weight loss.  HENT:  Negative for nosebleeds and sore throat.   Eyes:  Negative for double vision.  Respiratory:  Negative for cough, hemoptysis, sputum production, shortness of breath and wheezing.   Cardiovascular:  Negative for chest pain, palpitations, orthopnea and leg swelling.  Gastrointestinal:  Negative for blood in stool, constipation, diarrhea, heartburn, melena, nausea and vomiting.  Genitourinary:  Positive for dysuria.  Musculoskeletal:  Negative for back pain and joint  pain.  Skin: Negative.  Negative for itching and rash.  Neurological:  Positive for tingling. Negative for dizziness, focal weakness, weakness and headaches.  Endo/Heme/Allergies:  Does not bruise/bleed easily.  Psychiatric/Behavioral:  Negative for depression. The patient is not nervous/anxious and does not have insomnia.      MEDICAL HISTORY:  Past Medical History:  Diagnosis Date   Allergic state    Anemia    Arthritis    Cancer ([place])    skin cancers   Chronic cystitis with hematuria    Depression    Diabetes mellitus without complication ([place])    Fibrocystic disease of both breasts    Fibromyalgia    GERD (gastroesophageal reflux disease)    Hypertension    IBS (irritable bowel syndrome)    Melanoma ([place]) 07/09/2021   lt neck/shoulder area   Melanoma of neck ([place]) 09/06/2021   left   Nausea    Skin cancer    Wears dentures    full upper and lower    SURGICAL HISTORY: Past Surgical History:  Procedure Laterality Date   ABDOMINAL HYSTERECTOMY     APPENDECTOMY     BREAST BIOPSY Right 2001   surgical bx    CATARACT EXTRACTION W/PHACO Right 03/08/2017   Procedure: CATARACT EXTRACTION PHACO AND INTRAOCULAR LENS PLACEMENT ([place]) RIGHT DIABETIC TORIC;  Surgeon: Leandrew Koyanagi, MD;  Location: [facility];  Service: Ophthalmology;  Laterality: Right;  Diabetic - oral meds   CATARACT EXTRACTION W/PHACO Left 04/17/2017   Procedure: CATARACT EXTRACTION PHACO AND INTRAOCULAR LENS PLACEMENT ([place]) LEFT DIABETIC;  Surgeon: Leandrew Koyanagi, MD;  Location: [facility];  Service: Ophthalmology;  Laterality: Left;  diabetic-oral meds   CHOLECYSTECTOMY     COLONOSCOPY     COLONOSCOPY WITH PROPOFOL N/A 08/27/2015  Procedure: COLONOSCOPY WITH PROPOFOL;  Surgeon: Manya Silvas, MD;  Location: [facility] ENDOSCOPY;  Service: Endoscopy;  Laterality: N/A;   COLONOSCOPY WITH PROPOFOL N/A 05/02/2016   Procedure: COLONOSCOPY WITH PROPOFOL;  Surgeon: Manya Silvas, MD;   Location: [facility] ENDOSCOPY;  Service: Endoscopy;  Laterality: N/A;   ESOPHAGOGASTRODUODENOSCOPY     ESOPHAGOGASTRODUODENOSCOPY (EGD) WITH PROPOFOL N/A 08/27/2015   Procedure: ESOPHAGOGASTRODUODENOSCOPY (EGD) WITH PROPOFOL;  Surgeon: Manya Silvas, MD;  Location: [facility] ENDOSCOPY;  Service: Endoscopy;  Laterality: N/A;   HERNIA REPAIR      SOCIAL HISTORY: Social History   Socioeconomic History   Marital status: Married    Spouse name: Not on file   Number of children: Not on file   Years of education: Not on file   Highest education level: Not on file  Occupational History   Not on file  Tobacco Use   Smoking status: Former    Types: Cigarettes    Quit date: 26    Years since quitting: 63.8   Smokeless tobacco: Never  Vaping Use   Vaping Use: Never used  Substance and Sexual Activity   Alcohol use: No   Drug use: No   Sexual activity: Not Currently  Other Topics Concern   Not on file  Social History Narrative   Not on file   Social Determinants of Health   Financial Resource Strain: Not on file  Food Insecurity: Not on file  Transportation Needs: Not on file  Physical Activity: Not on file  Stress: Not on file  Social Connections: Not on file  Intimate Partner Violence: Not on file    FAMILY HISTORY: Family History  Problem Relation Age of Onset   Hypertension Mother    Ovarian cancer Paternal Grandmother    COPD Father    Hyperlipidemia Father    Kidney disease Father    Diabetes Maternal Grandmother    Breast cancer Neg Hx     ALLERGIES:  is allergic to biaxin [clarithromycin], brompheniramine maleate, ceclor [cefaclor], ciprofloxacin, clinoril [sulindac], codeine sulfate, diovan [valsartan], doxycycline, erythromycin, esomeprazole, fluoxetine, guaifenesin & derivatives, levaquin [levofloxacin in d5w], lisinopril, lodine [etodolac], macrodantin [nitrofurantoin macrocrystal], medrol [methylprednisolone], mobic [meloxicam], motrin [ibuprofen], nexium  [esomeprazole magnesium], nitrofurantoin, norvasc [amlodipine besylate], omnicef [cefdinir], penicillins, prozac [fluoxetine hcl], pseudoephedrine, reglan [metoclopramide], seldane [terfenadine], sulfa antibiotics, toprol xl [metoprolol tartrate], triamterene, tussionex pennkinetic er [hydrocod poli-chlorphe poli er], vibramycin [doxycycline calcium], and amlodipine.  MEDICATIONS:  Current Outpatient Medications  Medication Sig Dispense Refill   Ascorbic Acid (VITAMIN C) 1000 MG tablet Take 1,000 mg by mouth 2 times daily at 12 noon and 4 pm.     cholecalciferol (VITAMIN D3) 25 MCG (1000 UT) tablet Take 1,000 Units by mouth 2 (two) times daily.     clidinium-chlordiazePOXIDE (LIBRAX) 5-2.5 MG capsule Take 1 capsule by mouth.     Cranberry 405 MG CAPS Take 2 capsules by mouth 2 (two) times daily.      FERREX 150 150 MG capsule      fluticasone (FLONASE) 50 MCG/ACT nasal spray Place into both nostrils daily.     gabapentin (NEURONTIN) 100 MG capsule Take 1 capsule by mouth daily.     gemfibrozil (LOPID) 600 MG tablet Take 600 mg by mouth 2 (two) times daily before a meal.     glipiZIDE (GLUCOTROL XL) 5 MG 24 hr tablet Take 1 tablet by mouth daily.     hydrochlorothiazide (HYDRODIURIL) 25 MG tablet Take 25 mg by mouth daily.     Lactobacillus Rhamnosus,  GG, ([employer] PROBIOTIC, LACTOBACILLUS, PO) Take by mouth.     metFORMIN (GLUCOPHAGE) 500 MG tablet Take 1,000 mg by mouth 2 (two) times daily with a meal.      Multiple Vitamins-Minerals (MULTIVITAMIN WITH MINERALS) tablet Take 1 tablet by mouth daily.     OMEGA-3 FATTY ACIDS PO Take by mouth.     pantoprazole (PROTONIX) 40 MG tablet Take 40 mg by mouth daily.     vitamin B-12 (CYANOCOBALAMIN) 1000 MCG tablet Take 1,000 mcg by mouth daily.     vitamin E 100 UNIT capsule Take by mouth daily.     No current facility-administered medications for this visit.   Facility-Administered Medications Ordered in Other Visits  Medication Dose Route Frequency  Provider Last Rate Last Admin   iron sucrose (VENOFER) injection 200 mg  200 mg Intravenous Once Lequita Asal, MD          .  PHYSICAL EXAMINATION: ECOG PERFORMANCE STATUS: 0 - Asymptomatic  Vitals:   03/25/22 1404 03/25/22 1414  BP: (!) 182/74 (!) 178/83  Pulse: 83   Resp: 20   Temp: (!) 96.6 F (35.9 C)   SpO2: 100%    Filed Weights   03/25/22 1404  Weight: 152 lb 3.2 oz (69 kg)    Physical Exam Constitutional:      Comments: Patient is alone.  Ambulating dependently.  HENT:     Head: Normocephalic and atraumatic.     Mouth/Throat:     Pharynx: No oropharyngeal exudate.  Eyes:     Pupils: Pupils are equal, round, and reactive to light.  Cardiovascular:     Rate and Rhythm: Normal rate and regular rhythm.  Pulmonary:     Effort: Pulmonary effort is normal. No respiratory distress.     Breath sounds: Normal breath sounds. No wheezing.  Abdominal:     General: Bowel sounds are normal. There is no distension.     Palpations: Abdomen is soft. There is no mass.     Tenderness: There is no abdominal tenderness. There is no guarding or rebound.  Musculoskeletal:        General: No tenderness. Normal range of motion.     Cervical back: Normal range of motion and neck supple.  Skin:    General: Skin is warm.  Neurological:     Mental Status: She is alert and oriented to person, place, and time.  Psychiatric:        Mood and Affect: Affect normal.    LABORATORY DATA:  I have reviewed the data as listed Lab Results  Component Value Date   WBC 3.4 (L) 03/25/2022   HGB 10.0 (L) 03/25/2022   HCT 30.5 (L) 03/25/2022   MCV 95.9 03/25/2022   PLT 129 (L) 03/25/2022   Recent Labs    07/23/21 1324 11/18/21 1317 03/25/22 1338  NA 137 140 140  K 4.1 3.9 4.2  CL 101 106 107  CO2 '27 25 26  '$ GLUCOSE 170* 156* 259*  BUN 32* 29* 34*  CREATININE 0.87 1.21* 1.18*  CALCIUM 9.7 9.4 9.3  GFRNONAA >60 44* 46*    RADIOGRAPHIC STUDIES: I have personally reviewed  the radiological images as listed and agreed with the findings in the report. No results found.  ASSESSMENT & PLAN:   Thrombocytopenia ([place]) #Chronic mild anemia-hemoglobin  10-11; JUNE 2023--Iron sat-18%; Ferritin- 409; Proceed with Venofer today.STABLE.  # ITP/ Thrombocytopenia-platelet count-130-150-STABLE. ; on surveillance.  # Elevated HTN- keep a log BPs; Repeat 170/90.  And defer  to PCP, Dr.Hande.   # Fatigue- ? Insomnia- recommend melatonin '1mg'$ /day.   #Left lower extrimity swelling-question etiology.  NEG- Dopplers; keep legs elevated. Awaiting ? CT scan- with Dr.Hande.   # DISPOSITION: #  Venofer today # venofer weekly x2  # follow up in 4 months MD: labs- cbc/bmp/iron studies/ferritin--possible Venofer-Dr.B    Cammie Sickle, MD 03/25/2022 2:38 PM

## 2022-03-28 DIAGNOSIS — M48061 Spinal stenosis, lumbar region without neurogenic claudication: Secondary | ICD-10-CM | POA: Diagnosis not present

## 2022-03-28 DIAGNOSIS — M5136 Other intervertebral disc degeneration, lumbar region: Secondary | ICD-10-CM | POA: Diagnosis not present

## 2022-03-28 DIAGNOSIS — D75839 Thrombocytosis, unspecified: Secondary | ICD-10-CM | POA: Diagnosis not present

## 2022-03-28 DIAGNOSIS — M5126 Other intervertebral disc displacement, lumbar region: Secondary | ICD-10-CM | POA: Diagnosis not present

## 2022-03-28 DIAGNOSIS — M5416 Radiculopathy, lumbar region: Secondary | ICD-10-CM | POA: Diagnosis not present

## 2022-03-30 MED FILL — Iron Sucrose Inj 20 MG/ML (Fe Equiv): INTRAVENOUS | Qty: 10 | Status: AC

## 2022-03-31 ENCOUNTER — Inpatient Hospital Stay: Payer: Medicare HMO

## 2022-03-31 VITALS — BP 185/83 | HR 80 | Temp 97.5°F | Resp 18

## 2022-03-31 DIAGNOSIS — D508 Other iron deficiency anemias: Secondary | ICD-10-CM

## 2022-03-31 DIAGNOSIS — D509 Iron deficiency anemia, unspecified: Secondary | ICD-10-CM | POA: Diagnosis not present

## 2022-03-31 MED ORDER — SODIUM CHLORIDE 0.9 % IV SOLN
200.0000 mg | Freq: Once | INTRAVENOUS | Status: AC
  Administered 2022-03-31: 200 mg via INTRAVENOUS
  Filled 2022-03-31: qty 10

## 2022-03-31 MED ORDER — SODIUM CHLORIDE 0.9 % IV SOLN
INTRAVENOUS | Status: DC
  Filled 2022-03-31: qty 250

## 2022-04-01 ENCOUNTER — Ambulatory Visit
Admission: RE | Admit: 2022-04-01 | Discharge: 2022-04-01 | Disposition: A | Discharge status: Home / Self Care | Payer: Medicare HMO | Source: Ambulatory Visit | Attending: Internal Medicine | Admitting: Internal Medicine

## 2022-04-01 DIAGNOSIS — R600 Localized edema: Secondary | ICD-10-CM | POA: Insufficient documentation

## 2022-04-01 DIAGNOSIS — K5730 Diverticulosis of large intestine without perforation or abscess without bleeding: Secondary | ICD-10-CM | POA: Diagnosis not present

## 2022-04-01 DIAGNOSIS — R1032 Left lower quadrant pain: Secondary | ICD-10-CM

## 2022-04-01 MED ORDER — IOHEXOL 300 MG/ML  SOLN
100.0000 mL | Freq: Once | INTRAMUSCULAR | Status: AC | PRN
  Administered 2022-04-01: 100 mL via INTRAVENOUS

## 2022-04-07 ENCOUNTER — Inpatient Hospital Stay: Payer: Medicare HMO | Attending: Internal Medicine

## 2022-04-07 DIAGNOSIS — D75839 Thrombocytosis, unspecified: Secondary | ICD-10-CM | POA: Diagnosis not present

## 2022-04-07 DIAGNOSIS — D508 Other iron deficiency anemias: Secondary | ICD-10-CM

## 2022-04-07 DIAGNOSIS — M1612 Unilateral primary osteoarthritis, left hip: Secondary | ICD-10-CM | POA: Diagnosis not present

## 2022-04-07 DIAGNOSIS — D509 Iron deficiency anemia, unspecified: Secondary | ICD-10-CM | POA: Insufficient documentation

## 2022-04-07 MED ORDER — SODIUM CHLORIDE 0.9 % IV SOLN
200.0000 mg | Freq: Once | INTRAVENOUS | Status: AC
  Administered 2022-04-07: 200 mg via INTRAVENOUS
  Filled 2022-04-07: qty 200

## 2022-04-07 MED ORDER — SODIUM CHLORIDE 0.9 % IV SOLN
INTRAVENOUS | Status: DC
  Filled 2022-04-07: qty 250

## 2022-04-07 NOTE — Progress Notes (Signed)
Per Dr. Rogue Bussing, ok to proceed with Venofer with a BP of 183/81. Informed patient to re check her BP at home and call her PCP to adjust medications if needed.

## 2022-04-08 DIAGNOSIS — R195 Other fecal abnormalities: Secondary | ICD-10-CM | POA: Diagnosis not present

## 2022-04-18 DIAGNOSIS — R8290 Unspecified abnormal findings in urine: Secondary | ICD-10-CM | POA: Diagnosis not present

## 2022-05-19 DIAGNOSIS — M1612 Unilateral primary osteoarthritis, left hip: Secondary | ICD-10-CM | POA: Diagnosis not present

## 2022-05-19 DIAGNOSIS — M7918 Myalgia, other site: Secondary | ICD-10-CM | POA: Diagnosis not present

## 2022-05-19 DIAGNOSIS — M5416 Radiculopathy, lumbar region: Secondary | ICD-10-CM | POA: Diagnosis not present

## 2022-05-19 DIAGNOSIS — M5126 Other intervertebral disc displacement, lumbar region: Secondary | ICD-10-CM | POA: Diagnosis not present

## 2022-05-19 DIAGNOSIS — M48061 Spinal stenosis, lumbar region without neurogenic claudication: Secondary | ICD-10-CM | POA: Diagnosis not present

## 2022-05-19 DIAGNOSIS — M5136 Other intervertebral disc degeneration, lumbar region: Secondary | ICD-10-CM | POA: Diagnosis not present

## 2022-05-23 ENCOUNTER — Observation Stay
Admission: EM | Admit: 2022-05-23 | Discharge: 2022-05-24 | Disposition: A | Discharge status: Home Health | Payer: Medicare HMO | Attending: Internal Medicine | Admitting: Internal Medicine

## 2022-05-23 ENCOUNTER — Emergency Department: Payer: Medicare HMO

## 2022-05-23 ENCOUNTER — Other Ambulatory Visit: Payer: Self-pay

## 2022-05-23 DIAGNOSIS — M1611 Unilateral primary osteoarthritis, right hip: Secondary | ICD-10-CM | POA: Diagnosis not present

## 2022-05-23 DIAGNOSIS — R509 Fever, unspecified: Secondary | ICD-10-CM | POA: Diagnosis not present

## 2022-05-23 DIAGNOSIS — Z87891 Personal history of nicotine dependence: Secondary | ICD-10-CM | POA: Diagnosis not present

## 2022-05-23 DIAGNOSIS — Z79899 Other long term (current) drug therapy: Secondary | ICD-10-CM | POA: Diagnosis not present

## 2022-05-23 DIAGNOSIS — D509 Iron deficiency anemia, unspecified: Secondary | ICD-10-CM | POA: Diagnosis present

## 2022-05-23 DIAGNOSIS — R8271 Bacteriuria: Secondary | ICD-10-CM | POA: Diagnosis present

## 2022-05-23 DIAGNOSIS — I10 Essential (primary) hypertension: Secondary | ICD-10-CM | POA: Insufficient documentation

## 2022-05-23 DIAGNOSIS — N3000 Acute cystitis without hematuria: Secondary | ICD-10-CM

## 2022-05-23 DIAGNOSIS — M25551 Pain in right hip: Secondary | ICD-10-CM | POA: Diagnosis present

## 2022-05-23 DIAGNOSIS — N3090 Cystitis, unspecified without hematuria: Secondary | ICD-10-CM | POA: Diagnosis not present

## 2022-05-23 DIAGNOSIS — E876 Hypokalemia: Secondary | ICD-10-CM | POA: Diagnosis present

## 2022-05-23 DIAGNOSIS — Z85820 Personal history of malignant melanoma of skin: Secondary | ICD-10-CM | POA: Insufficient documentation

## 2022-05-23 DIAGNOSIS — M25552 Pain in left hip: Secondary | ICD-10-CM | POA: Diagnosis not present

## 2022-05-23 DIAGNOSIS — Z1152 Encounter for screening for COVID-19: Secondary | ICD-10-CM | POA: Insufficient documentation

## 2022-05-23 DIAGNOSIS — E119 Type 2 diabetes mellitus without complications: Secondary | ICD-10-CM | POA: Diagnosis not present

## 2022-05-23 DIAGNOSIS — R109 Unspecified abdominal pain: Secondary | ICD-10-CM | POA: Diagnosis not present

## 2022-05-23 DIAGNOSIS — Z7984 Long term (current) use of oral hypoglycemic drugs: Secondary | ICD-10-CM | POA: Insufficient documentation

## 2022-05-23 DIAGNOSIS — E785 Hyperlipidemia, unspecified: Secondary | ICD-10-CM | POA: Diagnosis not present

## 2022-05-23 DIAGNOSIS — M25572 Pain in left ankle and joints of left foot: Secondary | ICD-10-CM | POA: Diagnosis not present

## 2022-05-23 DIAGNOSIS — R059 Cough, unspecified: Secondary | ICD-10-CM | POA: Diagnosis not present

## 2022-05-23 DIAGNOSIS — R739 Hyperglycemia, unspecified: Secondary | ICD-10-CM | POA: Diagnosis not present

## 2022-05-23 DIAGNOSIS — R079 Chest pain, unspecified: Secondary | ICD-10-CM | POA: Diagnosis not present

## 2022-05-23 DIAGNOSIS — N390 Urinary tract infection, site not specified: Secondary | ICD-10-CM | POA: Diagnosis present

## 2022-05-23 LAB — COMPREHENSIVE METABOLIC PANEL
ALT: 11 U/L (ref 0–44)
AST: 10 U/L — ABNORMAL LOW (ref 15–41)
Albumin: 4 g/dL (ref 3.5–5.0)
Alkaline Phosphatase: 90 U/L (ref 38–126)
Anion gap: 12 (ref 5–15)
BUN: 29 mg/dL — ABNORMAL HIGH (ref 8–23)
CO2: 25 mmol/L (ref 22–32)
Calcium: 10.2 mg/dL (ref 8.9–10.3)
Chloride: 101 mmol/L (ref 98–111)
Creatinine, Ser: 0.97 mg/dL (ref 0.44–1.00)
GFR, Estimated: 57 mL/min — ABNORMAL LOW (ref >60)
Glucose, Bld: 180 mg/dL — ABNORMAL HIGH (ref 70–99)
Potassium: 3.4 mmol/L — ABNORMAL LOW (ref 3.5–5.1)
Sodium: 138 mmol/L (ref 135–145)
Total Bilirubin: 0.8 mg/dL (ref 0.3–1.2)
Total Protein: 7.6 g/dL (ref 6.5–8.1)

## 2022-05-23 LAB — CBC WITH DIFFERENTIAL/PLATELET
Abs Immature Granulocytes: 0.04 10*3/uL (ref 0.00–0.07)
Basophils Absolute: 0 10*3/uL (ref 0.0–0.1)
Basophils Relative: 0 %
Eosinophils Absolute: 0 10*3/uL (ref 0.0–0.5)
Eosinophils Relative: 0 %
HCT: 33 % — ABNORMAL LOW (ref 36.0–46.0)
Hemoglobin: 10.9 g/dL — ABNORMAL LOW (ref 12.0–15.0)
Immature Granulocytes: 1 %
Lymphocytes Relative: 8 %
Lymphs Abs: 0.7 10*3/uL (ref 0.7–4.0)
MCH: 31.1 pg (ref 26.0–34.0)
MCHC: 33 g/dL (ref 30.0–36.0)
MCV: 94.3 fL (ref 80.0–100.0)
Monocytes Absolute: 0.7 10*3/uL (ref 0.1–1.0)
Monocytes Relative: 8 %
Neutro Abs: 7.4 10*3/uL (ref 1.7–7.7)
Neutrophils Relative %: 83 %
Platelets: 147 10*3/uL — ABNORMAL LOW (ref 150–400)
RBC: 3.5 MIL/uL — ABNORMAL LOW (ref 3.87–5.11)
RDW: 12.5 % (ref 11.5–15.5)
WBC: 8.8 10*3/uL (ref 4.0–10.5)
nRBC: 0 % (ref 0.0–0.2)

## 2022-05-23 LAB — URINALYSIS, ROUTINE W REFLEX MICROSCOPIC
Bilirubin Urine: NEGATIVE
Glucose, UA: NEGATIVE mg/dL
Hgb urine dipstick: NEGATIVE
Ketones, ur: NEGATIVE mg/dL
Nitrite: NEGATIVE
Protein, ur: 100 mg/dL — AB
Specific Gravity, Urine: 1.017 (ref 1.005–1.030)
pH: 5 (ref 5.0–8.0)

## 2022-05-23 LAB — MAGNESIUM: Magnesium: 1.6 mg/dL — ABNORMAL LOW (ref 1.7–2.4)

## 2022-05-23 LAB — LACTIC ACID, PLASMA
Lactic Acid, Venous: 1 mmol/L (ref 0.5–1.9)
Lactic Acid, Venous: 0.8 mmol/L (ref 0.5–1.9)

## 2022-05-23 LAB — CBG MONITORING, ED
Glucose-Capillary: 176 mg/dL — ABNORMAL HIGH (ref 70–99)
Glucose-Capillary: 206 mg/dL — ABNORMAL HIGH (ref 70–99)

## 2022-05-23 LAB — RESP PANEL BY RT-PCR (RSV, FLU A&B, COVID)  RVPGX2
Influenza A by PCR: NEGATIVE
Influenza B by PCR: NEGATIVE
Resp Syncytial Virus by PCR: NEGATIVE
SARS Coronavirus 2 by RT PCR: NEGATIVE

## 2022-05-23 MED ORDER — PANTOPRAZOLE SODIUM 40 MG PO TBEC
40.0000 mg | DELAYED_RELEASE_TABLET | Freq: Every day | ORAL | Status: DC
  Administered 2022-05-24: 40 mg via ORAL
  Filled 2022-05-23: qty 1

## 2022-05-23 MED ORDER — FENTANYL CITRATE PF 50 MCG/ML IJ SOSY
12.5000 ug | PREFILLED_SYRINGE | INTRAMUSCULAR | Status: DC | PRN
  Administered 2022-05-24 (×2): 12.5 ug via INTRAVENOUS
  Filled 2022-05-23 (×2): qty 1

## 2022-05-23 MED ORDER — HYDROCHLOROTHIAZIDE 25 MG PO TABS
25.0000 mg | ORAL_TABLET | Freq: Every day | ORAL | Status: DC
  Administered 2022-05-23 – 2022-05-24 (×2): 25 mg via ORAL
  Filled 2022-05-23 (×2): qty 1

## 2022-05-23 MED ORDER — ACETAMINOPHEN 650 MG RE SUPP: 650.0000 mg | Freq: Four times a day (QID) | RECTAL | Status: DC | PRN

## 2022-05-23 MED ORDER — MAGNESIUM SULFATE 2 GM/50ML IV SOLN
2.0000 g | Freq: Once | INTRAVENOUS | Status: AC
  Administered 2022-05-23: 2 g via INTRAVENOUS
  Filled 2022-05-23: qty 50

## 2022-05-23 MED ORDER — LACTULOSE 10 GM/15ML PO SOLN
30.0000 g | Freq: Once | ORAL | Status: AC
  Administered 2022-05-23: 30 g via ORAL
  Filled 2022-05-23: qty 60

## 2022-05-23 MED ORDER — FENTANYL CITRATE PF 50 MCG/ML IJ SOSY
25.0000 ug | PREFILLED_SYRINGE | INTRAMUSCULAR | Status: DC | PRN
  Administered 2022-05-23: 25 ug via INTRAVENOUS
  Filled 2022-05-23: qty 1

## 2022-05-23 MED ORDER — ONDANSETRON HCL 4 MG/2ML IJ SOLN: 4.0000 mg | Freq: Three times a day (TID) | INTRAMUSCULAR | Status: DC | PRN

## 2022-05-23 MED ORDER — HEPARIN SODIUM (PORCINE) 5000 UNIT/ML IJ SOLN
5000.0000 [IU] | Freq: Three times a day (TID) | INTRAMUSCULAR | Status: DC
  Administered 2022-05-23 – 2022-05-24 (×3): 5000 [IU] via SUBCUTANEOUS
  Filled 2022-05-23 (×4): qty 1

## 2022-05-23 MED ORDER — SODIUM CHLORIDE 0.9 % IV SOLN
1.0000 g | Freq: Once | INTRAVENOUS | Status: AC
  Administered 2022-05-23: 1 g via INTRAVENOUS
  Filled 2022-05-23: qty 10

## 2022-05-23 MED ORDER — SODIUM CHLORIDE 0.9 % IV SOLN
1.0000 g | INTRAVENOUS | Status: DC
  Administered 2022-05-24: 1 g via INTRAVENOUS
  Filled 2022-05-23: qty 1

## 2022-05-23 MED ORDER — IOHEXOL 300 MG/ML  SOLN
100.0000 mL | Freq: Once | INTRAMUSCULAR | Status: AC | PRN
  Administered 2022-05-23: 100 mL via INTRAVENOUS

## 2022-05-23 MED ORDER — HYDROCHLOROTHIAZIDE 25 MG PO TABS: 25.0000 mg | ORAL_TABLET | Freq: Every day | ORAL | Status: DC

## 2022-05-23 MED ORDER — SODIUM CHLORIDE 0.9 % IV BOLUS
1000.0000 mL | Freq: Once | INTRAVENOUS | Status: AC
  Administered 2022-05-23: 1000 mL via INTRAVENOUS

## 2022-05-23 MED ORDER — INSULIN ASPART 100 UNIT/ML IJ SOLN
0.0000 [IU] | Freq: Three times a day (TID) | INTRAMUSCULAR | Status: DC
  Administered 2022-05-24: 7 [IU] via SUBCUTANEOUS
  Administered 2022-05-24: 2 [IU] via SUBCUTANEOUS
  Administered 2022-05-24: 5 [IU] via SUBCUTANEOUS
  Filled 2022-05-23 (×3): qty 1

## 2022-05-23 MED ORDER — POLYSACCHARIDE IRON COMPLEX 150 MG PO CAPS
150.0000 mg | ORAL_CAPSULE | Freq: Every day | ORAL | Status: DC
  Filled 2022-05-23 (×2): qty 1

## 2022-05-23 MED ORDER — VITAMIN D 25 MCG (1000 UNIT) PO TABS
1000.0000 [IU] | ORAL_TABLET | Freq: Two times a day (BID) | ORAL | Status: DC
  Administered 2022-05-23 – 2022-05-24 (×2): 1000 [IU] via ORAL
  Filled 2022-05-23 (×2): qty 1

## 2022-05-23 MED ORDER — GEMFIBROZIL 600 MG PO TABS
600.0000 mg | ORAL_TABLET | Freq: Two times a day (BID) | ORAL | Status: DC
  Administered 2022-05-24 (×2): 600 mg via ORAL
  Filled 2022-05-23 (×3): qty 1

## 2022-05-23 MED ORDER — POTASSIUM CHLORIDE CRYS ER 20 MEQ PO TBCR
40.0000 meq | EXTENDED_RELEASE_TABLET | Freq: Once | ORAL | Status: AC
  Administered 2022-05-23: 40 meq via ORAL
  Filled 2022-05-23: qty 2

## 2022-05-23 MED ORDER — ADULT MULTIVITAMIN W/MINERALS CH
1.0000 | ORAL_TABLET | Freq: Every day | ORAL | Status: DC
  Administered 2022-05-24: 1 via ORAL
  Filled 2022-05-23: qty 1

## 2022-05-23 MED ORDER — INSULIN ASPART 100 UNIT/ML IJ SOLN
0.0000 [IU] | Freq: Every day | INTRAMUSCULAR | Status: DC
  Administered 2022-05-23: 2 [IU] via SUBCUTANEOUS
  Filled 2022-05-23: qty 1

## 2022-05-23 MED ORDER — OMEGA-3-ACID ETHYL ESTERS 1 G PO CAPS
1000.0000 mg | ORAL_CAPSULE | Freq: Every day | ORAL | Status: DC
  Administered 2022-05-23 – 2022-05-24 (×2): 1000 mg via ORAL
  Filled 2022-05-23 (×4): qty 1

## 2022-05-23 MED ORDER — VITAMIN B-12 1000 MCG PO TABS
1000.0000 ug | ORAL_TABLET | Freq: Every day | ORAL | Status: DC
  Administered 2022-05-24: 1000 ug via ORAL
  Filled 2022-05-23: qty 1

## 2022-05-23 MED ORDER — HYDRALAZINE HCL 20 MG/ML IJ SOLN
5.0000 mg | INTRAMUSCULAR | Status: DC | PRN
  Administered 2022-05-23: 5 mg via INTRAVENOUS
  Filled 2022-05-23: qty 1

## 2022-05-23 MED ORDER — ACETAMINOPHEN 325 MG PO TABS
650.0000 mg | ORAL_TABLET | Freq: Four times a day (QID) | ORAL | Status: DC | PRN
  Administered 2022-05-23 – 2022-05-24 (×3): 650 mg via ORAL
  Filled 2022-05-23 (×3): qty 2

## 2022-05-23 NOTE — ED Provider Notes (Signed)
87 yo F can't walk due to weakness and pain in R hip, back. Does have dysuria, fevers, as well. UA concerning for infection. LA normal. IV Rocephin, admit. ? Further imaging. No family with her. Admit to medicine.   Duffy Bruce, MD 05/23/22 [phone]

## 2022-05-23 NOTE — ED Triage Notes (Signed)
Pt denies any recent falls or trauma to the hip.

## 2022-05-23 NOTE — ED Notes (Signed)
First nurse note: Pt here via [place] with c/o of R hip pain, Pt had a steroid shot in her L buttocks Friday and states she woke up Saturday morning and can't walk like normal. No injury or trauma noted. Pt took 650 mg of tylenol PTA.   98.6 HR: 99 183/83 99%.  CBG 285

## 2022-05-23 NOTE — ED Provider Notes (Signed)
[facility] Provider Note    Event Date/Time   First MD Initiated Contact with Patient 05/23/22 1221     (approximate)   History   Hip Pain   HPI  Kim Ballard is a 87 y.o. female extensive past medical history diabetes fibromyalgia IBS presents to the ER for evaluation of worsening low back pain right hip pain dysuria fevers and chills.  Just recently had prednisone injection in the right hip think she might have had a complication secondary to that.  She has been unable to ambulate secondary to the pain.  Denies any cough no shortness of breath.     Physical Exam   Triage Vital Signs: ED Triage Vitals  Enc Vitals Group     BP 05/23/22 1138 (!) 174/82     Pulse Rate 05/23/22 1138 93     Resp 05/23/22 1138 18     Temp 05/23/22 1138 100.1 F (37.8 C)     Temp src --      SpO2 05/23/22 1138 100 %     Weight 05/23/22 1220 152 lb 1.9 oz (69 kg)     Height 05/23/22 1220 '5\' 7"'$  (1.702 m)     Head Circumference --      Peak Flow --      Pain Score 05/23/22 1152 10     Pain Loc --      Pain Edu? --      Excl. in [place]? --     Most recent vital signs: Vitals:   05/23/22 1416 05/23/22 1524  BP: (!) 169/64 (!) 173/74  Pulse: 94 97  Resp: 20 20  Temp: (!) 100.5 F (38.1 C) 99.7 F (37.6 C)  SpO2: 98% 98%     Constitutional: Alert  Eyes: Conjunctivae are normal.  Head: Atraumatic. Nose: No congestion/rhinnorhea. Mouth/Throat: Mucous membranes are moist.   Neck: Painless ROM.  Cardiovascular:   Good peripheral circulation. Respiratory: Normal respiratory effort.  No retractions.  Gastrointestinal: Soft and nontender.  Musculoskeletal:  no deformity.  Pain with flexion of the right hip greater than 45 degrees.  No pain with internal rotation.  Neurovascular intact distally.  Overlying erythema warmth or cellulitis. Neurologic:  MAE spontaneously. No gross focal neurologic deficits are appreciated.  Skin:  Skin is warm, dry and intact. No rash  noted. Psychiatric: Mood and affect are normal. Speech and behavior are normal.    ED Results / Procedures / Treatments   Labs (all labs ordered are listed, but only abnormal results are displayed) Labs Reviewed  CBC WITH DIFFERENTIAL/PLATELET - Abnormal; Notable for the following components:      Result Value   RBC 3.50 (*)    Hemoglobin 10.9 (*)    HCT 33.0 (*)    Platelets 147 (*)    All other components within normal limits  COMPREHENSIVE METABOLIC PANEL - Abnormal; Notable for the following components:   Potassium 3.4 (*)    Glucose, Bld 180 (*)    BUN 29 (*)    AST 10 (*)    GFR, Estimated 57 (*)    All other components within normal limits  URINALYSIS, ROUTINE W REFLEX MICROSCOPIC - Abnormal; Notable for the following components:   Color, Urine YELLOW (*)    APPearance HAZY (*)    Protein, ur 100 (*)    Leukocytes,Ua SMALL (*)    Bacteria, UA RARE (*)    All other components within normal limits  CBG MONITORING, ED - Abnormal; Notable  for the following components:   Glucose-Capillary 176 (*)    All other components within normal limits  RESP PANEL BY RT-PCR (RSV, FLU A&B, COVID)  [id number]  URINE CULTURE  CULTURE, BLOOD (ROUTINE X 2)  CULTURE, BLOOD (ROUTINE X 2)  LACTIC ACID, PLASMA  LACTIC ACID, PLASMA     EKG     RADIOLOGY Please see ED Course for my review and interpretation.  I personally reviewed all radiographic images ordered to evaluate for the above acute complaints and reviewed radiology reports and findings.  These findings were personally discussed with the patient.  Please see medical record for radiology report.    PROCEDURES:  Critical Care performed: No  Procedures   MEDICATIONS ORDERED IN ED: Medications  fentaNYL (SUBLIMAZE) injection 25 mcg (25 mcg Intravenous Given 05/23/22 1419)  cefTRIAXone (ROCEPHIN) 1 g in sodium chloride 0.9 % 100 mL IVPB (has no administration in time range)  lactulose (CHRONULAC) 10 GM/15ML solution 30  g (has no administration in time range)  iohexol (OMNIPAQUE) 300 MG/ML solution 100 mL (100 mLs Intravenous Contrast Given 05/23/22 1440)     IMPRESSION / MDM / ASSESSMENT AND PLAN / ED COURSE  I reviewed the triage vital signs and the nursing notes.                              Differential diagnosis includes, but is not limited to, fracture, contusion, arthritis, septic arthritis, diverticulitis, pyelonephritis, stone, cystitis, sepsis, flu, COVID, pneumonia  Patient presenting to the ER for evaluation of symptoms as described above.  Based on symptoms, risk factors and considered above differential, this presenting complaint could reflect a potentially life-threatening illness therefore the patient will be placed on continuous pulse oximetry and telemetry for monitoring.  Laboratory evaluation will be sent to evaluate for the above complaints.    CT imaging ordered for the above differential.   Clinical Course as of 05/23/22 1550  Mon May 23, 2022  1359 X-rays reassuring.  Blood work without any evidence of leukocytosis.  No thrombocytosis.  Given her low-grade temperature pain in the right lower quadrant will order CT imaging. [PR]  7846 CT imaging without acute findings.  Urinalysis does show greater than 21,000 whites rare bacteria lactate is normal.  She is not septic but patient unable to ambulate due to pain and weakness.  No clear etiology of her pain.  Have a lower suspicion for septic joint given lack of effusion.  Will cover with antibiotics for possible UTI.  Will consult PT.  Will consult hospitalist as patient does not feel that she can go home she lives at home alone.  Will consult hospitalist for admission. [PR]    Clinical Course User Index [PR] Merlyn Lot, MD    FINAL CLINICAL IMPRESSION(S) / ED DIAGNOSES   Final diagnoses:  Right hip pain  Cystitis     Rx / DC Orders   ED Discharge Orders     None        Note:  This document was prepared using  Dragon voice recognition software and may include unintentional dictation errors.    Merlyn Lot, MD 05/23/22 1550

## 2022-05-23 NOTE — ED Triage Notes (Signed)
Pt presents to the ED via EMS due to hip pain. Pt states she got a shot in her R hip due to pain. Since the shot on Friday, pt is unable to walk. Pt states the pain travels down her leg. Pt A&Ox4

## 2022-05-23 NOTE — H&P (Signed)
History and Physical    Kim Ballard:[id number] DOB: 09-Jan-1935 DOA: 05/23/2022  Referring MD/NP/PA:   PCP: Tracie Harrier, MD   Patient coming from:  The patient is coming from home.    Chief Complaint: right hip pain and dysuria, fever  HPI: Kim Ballard is a 87 y.o. female with medical history significant of hypertension, hyperlipidemia, diabetes mellitus, GERD, depression, IBS, melanoma, anemia, fibromyalgia, who presents with right hip pain, dysuria and fever.  Patient states that he had prednisone injection in the right hip on Friday, then started having pain in the injection location in her right hip.  The pain is constant, sharp, severe, radiating to the upper leg.  The pain is so severe that she cannot walk normally.  No leg numbness or tingling. She also reports dysuria, burning on urination in the past several days.  She has mild fever and chills.  Denies chest pain, cough, shortness breath.  No nausea, vomiting, diarrhea or abdominal pain.  No symptoms of UTI.  Data reviewed independently and ED Course: pt was found to have WBC 8.8, lactic acid 1.0, 0.8, negative COVID PCR and flu PCR, positive urinalysis (hazy appearance, small amount of leukocyte, rare bacteria, WBC 20-50), potassium 3.4, creatinine 0.97, BUN 29, temperature 100.5, blood pressure 173/74, heart rate 97, RR 20.  CT abdomen/pelvis negative for acute intra-abdominal issues.  Patient is placed on MedSurg bed for observation  CT-right hip: No evidence of acute right hip fracture. And moderate right hip osteoarthritis. There is no significant joint effusion. No focal fluid collection.    EKG: I have personally reviewed.  Sinus rhythm, QTc 419, left axis deviation.   Review of Systems:   General: has fevers, chills, no body weight gain, has fatigue HEENT: no blurry vision, hearing changes or sore throat Respiratory: no dyspnea, coughing, wheezing CV: no chest pain, no palpitations GI: no nausea,  vomiting, abdominal pain, diarrhea, constipation GU: has dysuria, burning on urination, increased urinary frequency, no hematuria  Ext: no leg edema Neuro: no unilateral weakness, numbness, or tingling, no vision change or hearing loss Skin: no rash, no skin tear. MSK: Has right hip pain. Heme: No easy bruising.  Travel history: No recent long distant travel.   Allergy:  Allergies  Allergen Reactions   Biaxin [Clarithromycin] Other (See Comments)    GI UPSET    Brompheniramine Maleate    Ceclor [Cefaclor]    Ciprofloxacin    Clinoril [Sulindac]    Codeine Sulfate    Diovan [Valsartan]    Doxycycline     Able to tolerate low dose   Erythromycin    Esomeprazole    Fluoxetine Other (See Comments)    UNKNOWN   Guaifenesin & Derivatives    Levaquin [Levofloxacin In D5w]     Can take in low doses   Lisinopril    Lodine [Etodolac]    Macrodantin [Nitrofurantoin Macrocrystal]    Medrol [Methylprednisolone]    Mobic [Meloxicam]    Motrin [Ibuprofen]    Nexium [Esomeprazole Magnesium]    Nitrofurantoin Other (See Comments)   Norvasc [Amlodipine Besylate]    Omnicef [Cefdinir] Other (See Comments)    Developed whelps 10 days laster    Penicillins    Prozac [Fluoxetine Hcl] Other (See Comments)    Makes her feel crazy    Pseudoephedrine    Reglan [Metoclopramide]    Seldane [Terfenadine]    Sulfa Antibiotics Other (See Comments)   Toprol Xl [Metoprolol Tartrate]    Triamterene  Tussionex Pennkinetic Er [Hydrocod Poli-Chlorphe Poli Er]    Vibramycin [Doxycycline Calcium]    Amlodipine Rash    Other reaction(s): Other (See Comments) Edema Edema    Past Medical History:  Diagnosis Date   Allergic state    Anemia    Arthritis    Cancer ([place])    skin cancers   Chronic cystitis with hematuria    Depression    Diabetes mellitus without complication ([place])    Fibrocystic disease of both breasts    Fibromyalgia    GERD (gastroesophageal reflux disease)     Hypertension    IBS (irritable bowel syndrome)    Melanoma ([place]) 07/09/2021   lt neck/shoulder area   Melanoma of neck ([place]) 09/06/2021   left   Nausea    Skin cancer    Wears dentures    full upper and lower    Past Surgical History:  Procedure Laterality Date   ABDOMINAL HYSTERECTOMY     APPENDECTOMY     BREAST BIOPSY Right 2001   surgical bx    CATARACT EXTRACTION W/PHACO Right 03/08/2017   Procedure: CATARACT EXTRACTION PHACO AND INTRAOCULAR LENS PLACEMENT ([place]) RIGHT DIABETIC TORIC;  Surgeon: Leandrew Koyanagi, MD;  Location: [facility];  Service: Ophthalmology;  Laterality: Right;  Diabetic - oral meds   CATARACT EXTRACTION W/PHACO Left 04/17/2017   Procedure: CATARACT EXTRACTION PHACO AND INTRAOCULAR LENS PLACEMENT ([place]) LEFT DIABETIC;  Surgeon: Leandrew Koyanagi, MD;  Location: [facility];  Service: Ophthalmology;  Laterality: Left;  diabetic-oral meds   CHOLECYSTECTOMY     COLONOSCOPY     COLONOSCOPY WITH PROPOFOL N/A 08/27/2015   Procedure: COLONOSCOPY WITH PROPOFOL;  Surgeon: Manya Silvas, MD;  Location: [facility] ENDOSCOPY;  Service: Endoscopy;  Laterality: N/A;   COLONOSCOPY WITH PROPOFOL N/A 05/02/2016   Procedure: COLONOSCOPY WITH PROPOFOL;  Surgeon: Manya Silvas, MD;  Location[facility] ENDOSCOPY;  Service: Endoscopy;  Laterality: N/A;   ESOPHAGOGASTRODUODENOSCOPY     ESOPHAGOGASTRODUODENOSCOPY (EGD) WITH PROPOFOL N/A 08/27/2015   Procedure: ESOPHAGOGASTRODUODENOSCOPY (EGD) WITH PROPOFOL;  Surgeon: Manya Silvas, MD;  Location: [facility] ENDOSCOPY;  Service: Endoscopy;  Laterality: N/A;   HERNIA REPAIR      Social History:  reports that she quit smoking about 64 years ago. Her smoking use included cigarettes. She has never used smokeless tobacco. She reports that she does not drink alcohol and does not use drugs.  Family History:  Family History  Problem Relation Age of Onset   Hypertension Mother    Ovarian cancer Paternal Grandmother     COPD Father    Hyperlipidemia Father    Kidney disease Father    Diabetes Maternal Grandmother    Breast cancer Neg Hx      Prior to Admission medications   Medication Sig Start Date End Date Taking? Authorizing Provider  Ascorbic Acid (VITAMIN C) 1000 MG tablet Take 1,000 mg by mouth 2 times daily at 12 noon and 4 pm.    [provider]  cholecalciferol (VITAMIN D3) 25 MCG (1000 UT) tablet Take 1,000 Units by mouth 2 (two) times daily.    [provider]  clidinium-chlordiazePOXIDE (LIBRAX) 5-2.5 MG capsule Take 1 capsule by mouth.    [provider]  Cranberry 405 MG CAPS Take 2 capsules by mouth 2 (two) times daily.     [provider]  FERREX 150 150 MG capsule  05/09/19   [provider]  fluticasone (FLONASE) 50 MCG/ACT nasal spray Place into both nostrils daily.  [provider]  gabapentin (NEURONTIN) 100 MG capsule Take 1 capsule by mouth daily. 10/31/19 03/23/23  [provider]  gemfibrozil (LOPID) 600 MG tablet Take 600 mg by mouth 2 (two) times daily before a meal.    [provider]  glipiZIDE (GLUCOTROL XL) 5 MG 24 hr tablet Take 1 tablet by mouth daily. 10/08/19   [provider]  hydrochlorothiazide (HYDRODIURIL) 25 MG tablet Take 25 mg by mouth daily.    [provider]  Lactobacillus Rhamnosus, GG, ([employer] PROBIOTIC, LACTOBACILLUS, PO) Take by mouth.    [provider]  metFORMIN (GLUCOPHAGE) 500 MG tablet Take 1,000 mg by mouth 2 (two) times daily with a meal.     [provider]  Multiple Vitamins-Minerals (MULTIVITAMIN WITH MINERALS) tablet Take 1 tablet by mouth daily.    [provider]  OMEGA-3 FATTY ACIDS PO Take by mouth.    [provider]  pantoprazole (PROTONIX) 40 MG tablet Take 40 mg by mouth daily.    [provider]  vitamin B-12 (CYANOCOBALAMIN) 1000 MCG tablet Take 1,000 mcg by mouth daily.    [provider]  vitamin E  100 UNIT capsule Take by mouth daily.    [provider]    Physical Exam: Vitals:   05/23/22 1138 05/23/22 1220 05/23/22 1416 05/23/22 1524  BP: (!) 174/82  (!) 169/64 (!) 173/74  Pulse: 93  94 97  Resp: '18  20 20  '$ Temp: 100.1 F (37.8 C)  (!) 100.5 F (38.1 C) 99.7 F (37.6 C)  TempSrc:   Oral Oral  SpO2: 100%  98% 98%  Weight:  69 kg    Height:  '5\' 7"'$  (1.702 m)     General: Not in acute distress HEENT:       Eyes: PERRL, EOMI, no scleral icterus.       ENT: No discharge from the ears and nose, no pharynx injection, no tonsillar enlargement.        Neck: No JVD, no bruit, no mass felt. Heme: No neck lymph node enlargement. Cardiac: S1/S2, RRR, No murmurs, No gallops or rubs. Respiratory: No rales, wheezing, rhonchi or rubs. GI: Soft, nondistended, nontender, no rebound pain, no organomegaly, BS present. GU: No hematuria Ext: No pitting leg edema bilaterally. 1+DP/PT pulse bilaterally. Musculoskeletal: has an injection mark in the posterior right hip area, there is very small area of redness at the injection location, does not seem to have surrounding cellulitis.  There is tenderness in injection location, but no fluctuation. Skin: No rashes.  Neuro: Alert, oriented X3, cranial nerves II-XII grossly intact, moves all extremities normally.  Psych: Patient is not psychotic, no suicidal or hemocidal ideation.  Labs on Admission: I have personally reviewed following labs and imaging studies  CBC: Recent Labs  Lab 05/23/22 1242  WBC 8.8  NEUTROABS 7.4  HGB 10.9*  HCT 33.0*  MCV 94.3  PLT 299*   Basic Metabolic Panel: Recent Labs  Lab 05/23/22 1242  NA 138  K 3.4*  CL 101  CO2 25  GLUCOSE 180*  BUN 29*  CREATININE 0.97  CALCIUM 10.2  MG 1.6*   GFR: Estimated Creatinine Clearance: 41.2 mL/min (by C-G formula based on SCr of 0.97 mg/dL). Liver Function Tests: Recent Labs  Lab 05/23/22 1242  AST 10*  ALT 11  ALKPHOS 90  BILITOT 0.8  PROT 7.6   ALBUMIN 4.0   No results for input(s): "LIPASE", "AMYLASE" in the last 168 hours. No results for input(s): "  AMMONIA" in the last 168 hours. Coagulation Profile: No results for input(s): "INR", "PROTIME" in the last 168 hours. Cardiac Enzymes: No results for input(s): "CKTOTAL", "CKMB", "CKMBINDEX", "TROPONINI" in the last 168 hours. BNP (last 3 results) No results for input(s): "PROBNP" in the last 8760 hours. HbA1C: No results for input(s): "HGBA1C" in the last 72 hours. CBG: Recent Labs  Lab 05/23/22 1147  GLUCAP 176*   Lipid Profile: No results for input(s): "CHOL", "HDL", "LDLCALC", "TRIG", "CHOLHDL", "LDLDIRECT" in the last 72 hours. Thyroid Function Tests: No results for input(s): "TSH", "T4TOTAL", "FREET4", "T3FREE", "THYROIDAB" in the last 72 hours. Anemia Panel: No results for input(s): "VITAMINB12", "FOLATE", "FERRITIN", "TIBC", "IRON", "RETICCTPCT" in the last 72 hours. Urine analysis:    Component Value Date/Time   COLORURINE YELLOW (A) 05/23/2022 1337   APPEARANCEUR HAZY (A) 05/23/2022 1337   APPEARANCEUR Clear 06/14/2018 1519   LABSPEC 1.017 05/23/2022 1337   PHURINE 5.0 05/23/2022 1337   GLUCOSEU NEGATIVE 05/23/2022 1337   HGBUR NEGATIVE 05/23/2022 1337   BILIRUBINUR NEGATIVE 05/23/2022 1337   BILIRUBINUR Negative 06/14/2018 1519   KETONESUR NEGATIVE 05/23/2022 1337   PROTEINUR 100 (A) 05/23/2022 1337   NITRITE NEGATIVE 05/23/2022 1337   LEUKOCYTESUR SMALL (A) 05/23/2022 1337   Sepsis Labs: '@LABRCNTIP'$ (procalcitonin:4,lacticidven:4) ) Recent Results (from the past 240 hour(s))  Resp panel by RT-PCR (RSV, Flu A&B, Covid) Anterior Nasal Swab     Status: None   Collection Time: 05/23/22 12:42 PM   Specimen: Anterior Nasal Swab  Result Value Ref Range Status   SARS Coronavirus 2 by RT PCR NEGATIVE NEGATIVE Final    Comment: (NOTE) SARS-CoV-2 target nucleic acids are NOT DETECTED.  The SARS-CoV-2 RNA is generally detectable in upper  respiratory specimens during the acute phase of infection. The lowest concentration of SARS-CoV-2 viral copies this assay can detect is 138 copies/mL. A negative result does not preclude SARS-Cov-2 infection and should not be used as the sole basis for treatment or other patient management decisions. A negative result may occur with  improper specimen collection/handling, submission of specimen other than nasopharyngeal swab, presence of viral mutation(s) within the areas targeted by this assay, and inadequate number of viral copies(<138 copies/mL). A negative result must be combined with clinical observations, patient history, and epidemiological information. The expected result is Negative.  Fact Sheet for Patients:  EntrepreneurPulse.com.au  Fact Sheet for Healthcare Providers:  IncredibleEmployment.be  This test is no t yet approved or cleared by the [place] FDA and  has been authorized for detection and/or diagnosis of SARS-CoV-2 by FDA under an Emergency Use Authorization (EUA). This EUA will remain  in effect (meaning this test can be used) for the duration of the COVID-19 declaration under Section 564(b)(1) of the Act, 21 U.S.C.section 360bbb-3(b)(1), unless the authorization is terminated  or revoked sooner.       Influenza A by PCR NEGATIVE NEGATIVE Final   Influenza B by PCR NEGATIVE NEGATIVE Final    Comment: (NOTE) The Xpert Xpress SARS-CoV-2/FLU/RSV plus assay is intended as an aid in the diagnosis of influenza from Nasopharyngeal swab specimens and should not be used as a sole basis for treatment. Nasal washings and aspirates are unacceptable for Xpert Xpress SARS-CoV-2/FLU/RSV testing.  Fact Sheet for Patients: EntrepreneurPulse.com.au  Fact Sheet for Healthcare Providers: IncredibleEmployment.be  This test is not yet approved or cleared by the [place] FDA and has been  authorized for detection and/or diagnosis of SARS-CoV-2 by FDA under an Emergency Use Authorization (EUA). This EUA will  remain in effect (meaning this test can be used) for the duration of the COVID-19 declaration under Section 564(b)(1) of the Act, 21 U.S.C. section 360bbb-3(b)(1), unless the authorization is terminated or revoked.     Resp Syncytial Virus by PCR NEGATIVE NEGATIVE Final    Comment: (NOTE) Fact Sheet for Patients: EntrepreneurPulse.com.au  Fact Sheet for Healthcare Providers: IncredibleEmployment.be  This test is not yet approved or cleared by the [place] FDA and has been authorized for detection and/or diagnosis of SARS-CoV-2 by FDA under an Emergency Use Authorization (EUA). This EUA will remain in effect (meaning this test can be used) for the duration of the COVID-19 declaration under Section 564(b)(1) of the Act, 21 U.S.C. section 360bbb-3(b)(1), unless the authorization is terminated or revoked.  Performed at [facility], [address] [place], [place] [address]      Radiological Exams on Admission: CT ABDOMEN PELVIS W CONTRAST  Result Date: 05/23/2022 CLINICAL DATA:  Pain right lower quadrant of abdomen EXAM: CT ABDOMEN AND PELVIS WITH CONTRAST TECHNIQUE: Multidetector CT imaging of the abdomen and pelvis was performed using the standard protocol following bolus administration of intravenous contrast. RADIATION DOSE REDUCTION: This exam was performed according to the departmental dose-optimization program which includes automated exposure control, adjustment of the mA and/or kV according to patient size and/or use of iterative reconstruction technique. CONTRAST:  1100m OMNIPAQUE IOHEXOL 300 MG/ML  SOLN COMPARISON:  04/01/2022 FINDINGS: Lower chest: Small linear density in right lower lung field near the dome of right hemidiaphragm may suggest minimal scarring. No focal infiltrates are seen in the lower  lung fields. There is no pleural effusion. Hepatobiliary: No focal abnormalities are seen in liver. Surgical clips are seen in gallbladder fossa. Pancreas: No focal abnormalities are seen. Spleen: Unremarkable. Adrenals/Urinary Tract: Adrenals are unremarkable. There is no hydronephrosis. There are no renal or ureteral stones. There are few small smooth marginated low-density structures in renal cortex on both sides, possibly renal cysts largest measuring 9 mm in size. Urinary bladder is unremarkable. Stomach/Bowel: There is a ring-like structure around the gastroesophageal junction with no interval change. Stomach is unremarkable. Small bowel loops are not dilated. The appendix is not seen. Scattered diverticula are seen in colon without signs of focal acute diverticulitis. Moderate to large amount of stool is seen in the lumen of rectum. Transverse diameter of rectum measures 7.3 cm. Vascular/Lymphatic: Scattered arterial calcifications are seen. Reproductive: Uterus is not seen.  There are no adnexal masses. Other: There is no ascites or pneumoperitoneum. Small umbilical hernia containing fat is seen. Left inguinal hernia containing fat is seen. Musculoskeletal: Mild decrease in height of upper endplate of body of L3 vertebra has not changed. Degenerative changes are noted with disc space narrowing, bony spurs and facet hypertrophy at multiple levels. Degenerative changes are noted in both hips. IMPRESSION: There is no evidence of intestinal obstruction or pneumoperitoneum. There is no hydronephrosis. Diverticulosis of colon without signs of diverticulitis. Large amount of stool is seen in rectum suggesting fecal impaction. Renal cysts. Other chronic findings as described in the body of the report. Electronically Signed   By: PElmer PickerM.D.   On: 05/23/2022 15:33   CT Hip Right Wo Contrast  Result Date: 05/23/2022 CLINICAL DATA:  Hip pain, stress fracture suspected, neg xray EXAM: CT OF THE RIGHT  HIP WITHOUT CONTRAST TECHNIQUE: Multidetector CT imaging of the right hip was performed according to the standard protocol. Multiplanar CT image reconstructions were also generated. RADIATION DOSE REDUCTION: This exam  was performed according to the departmental dose-optimization program which includes automated exposure control, adjustment of the mA and/or kV according to patient size and/or use of iterative reconstruction technique. COMPARISON:  Same day hip radiograph, CT 04/01/2022 FINDINGS: Bones/Joint/Cartilage There is no evidence of acute fracture. Alignment is normal. There is moderate hip osteoarthritis with chondrocalcinosis and joint space narrowing posteriorly. There are dystrophic calcifications at the pubic symphysis. There is no significant joint effusion. Ligaments Suboptimally assessed by CT. Muscles and Tendons No acute myotendinous abnormality. Soft tissues No focal fluid collection. IMPRESSION: No evidence of acute right hip fracture. Moderate right hip osteoarthritis. Electronically Signed   By: Maurine Simmering M.D.   On: 05/23/2022 15:26   DG Chest 1 View  Result Date: 05/23/2022 CLINICAL DATA:  Cough, congestion, RIGHT hip pain EXAM: CHEST  1 VIEW COMPARISON:  Portable exam 1510 hours compared to 12/13/2006 FINDINGS: Borderline enlargement of cardiac silhouette. Mediastinal contours and pulmonary vascularity normal. Atherosclerotic calcification aorta. Lungs clear. No pulmonary infiltrate, pleural effusion, or pneumothorax. Bones demineralized. IMPRESSION: No acute abnormalities. Aortic Atherosclerosis (ICD10-I70.0). Electronically Signed   By: Lavonia Dana M.D.   On: 05/23/2022 15:22   DG Hip Unilat W or Wo Pelvis 2-3 Views Right  Result Date: 05/23/2022 CLINICAL DATA:  Acute right hip pain.  No known injury. EXAM: DG HIP (WITH OR WITHOUT PELVIS) 2-3V RIGHT COMPARISON:  None Available. FINDINGS: There is no evidence of hip fracture or dislocation. There is no evidence of arthropathy or  other focal bone abnormality. IMPRESSION: Negative. Electronically Signed   By: Marijo Conception M.D.   On: 05/23/2022 13:23      Assessment/Plan Principal Problem:   UTI (urinary tract infection) Active Problems:   Diabetes mellitus without complication (HCC)   Hyperlipidemia   Hypokalemia   Hypomagnesemia   Iron deficiency anemia   Right hip pain   Assessment and Plan:  UTI (urinary tract infection): Patient does not meet criteria for sepsis -Placed on MedSurg bed for observation -IV Rocephin -Follow-up blood culture and urine culture  Diabetes mellitus without complication ([place]): Recent A1c 6.8, well-controlled.  Patient taking glipizide and metformin at home -Sliding scale insulin  Hyperlipidemia -Lopid  Hypokalemia and Hypomagnesemi: Potassium 3.4, Mg 1.6 -Repleted potassium and magnesium  Iron deficiency anemia: Hemoglobin stable 10.9. -Continue iron supplement  Right hip pain: Etiology is not clear.  CT scan is negative for acute findings.  No local fluid collection, no joint effusion.  No signs of cellulitis on examination. -As needed Tylenol and fentanyl for pain -PT/ot    DVT ppx: SQ Lovenox  Code Status: Full code  Family Communication:   Yes, patient's friend at bedside  Disposition Plan:  Anticipate discharge back to previous environment  Consults called:  none  Admission status and Level of care: Med-Surg:  for obs    Dispo: The patient is from: Home              Anticipated d/c is to: Home              Anticipated d/c date is: 1 day              Patient currently is medically stable to d/c.    Severity of Illness:  The appropriate patient status for this patient is OBSERVATION. Observation status is judged to be reasonable and necessary in order to provide the required intensity of service to ensure the patient's safety. The patient's presenting symptoms, physical exam findings, and initial radiographic and laboratory  data in the context of  their medical condition is felt to place them at decreased risk for further clinical deterioration. Furthermore, it is anticipated that the patient will be medically stable for discharge from the hospital within 2 midnights of admission.        Date of Service 05/23/2022    [facility]   If 7PM-7AM, please contact night-coverage [email or web address] 05/23/2022, 6:30 PM

## 2022-05-23 NOTE — ED Provider Triage Note (Signed)
Emergency Medicine Provider Triage Evaluation Note  Kim Ballard , a 87 y.o. female  was evaluated in triage.  Pt complains of right hip pain after having a steroid injection by Dr. Sharlet Salina on Thursday or Friday. Since then, she has not been able to bear weight.  She called Dr. Nanci Pina office but was advised to come to the emergency department.  Physical Exam  BP (!) 174/82   Pulse 93   Temp 100.1 F (37.8 C)   Resp 18   SpO2 100%  Gen:   Awake, no distress   Resp:  Normal effort  MSK:   Moves extremities without difficulty  Other:    Medical Decision Making  Medically screening exam initiated at 11:53 AM.  Appropriate orders placed.  Kim Ballard was informed that the remainder of the evaluation will be completed by another provider, this initial triage assessment does not replace that evaluation, and the importance of remaining in the ED until their evaluation is complete.    Victorino Dike, FNP 05/23/22 [phone]

## 2022-05-24 DIAGNOSIS — M25572 Pain in left ankle and joints of left foot: Secondary | ICD-10-CM | POA: Diagnosis not present

## 2022-05-24 DIAGNOSIS — R509 Fever, unspecified: Secondary | ICD-10-CM | POA: Diagnosis not present

## 2022-05-24 DIAGNOSIS — M5431 Sciatica, right side: Secondary | ICD-10-CM | POA: Diagnosis not present

## 2022-05-24 DIAGNOSIS — Z7401 Bed confinement status: Secondary | ICD-10-CM | POA: Diagnosis not present

## 2022-05-24 DIAGNOSIS — M25551 Pain in right hip: Secondary | ICD-10-CM | POA: Diagnosis not present

## 2022-05-24 DIAGNOSIS — S76911A Strain of unspecified muscles, fascia and tendons at thigh level, right thigh, initial encounter: Secondary | ICD-10-CM | POA: Diagnosis not present

## 2022-05-24 DIAGNOSIS — W19XXXA Unspecified fall, initial encounter: Secondary | ICD-10-CM | POA: Diagnosis not present

## 2022-05-24 LAB — GLUCOSE, CAPILLARY
Glucose-Capillary: 282 mg/dL — ABNORMAL HIGH (ref 70–99)
Glucose-Capillary: 322 mg/dL — ABNORMAL HIGH (ref 70–99)

## 2022-05-24 LAB — CBC
HCT: 29.5 % — ABNORMAL LOW (ref 36.0–46.0)
Hemoglobin: 9.6 g/dL — ABNORMAL LOW (ref 12.0–15.0)
MCH: 30.7 pg (ref 26.0–34.0)
MCHC: 32.5 g/dL (ref 30.0–36.0)
MCV: 94.2 fL (ref 80.0–100.0)
Platelets: 144 10*3/uL — ABNORMAL LOW (ref 150–400)
RBC: 3.13 MIL/uL — ABNORMAL LOW (ref 3.87–5.11)
RDW: 12.2 % (ref 11.5–15.5)
WBC: 8.6 10*3/uL (ref 4.0–10.5)
nRBC: 0 % (ref 0.0–0.2)

## 2022-05-24 LAB — BASIC METABOLIC PANEL
Anion gap: 6 (ref 5–15)
BUN: 24 mg/dL — ABNORMAL HIGH (ref 8–23)
CO2: 26 mmol/L (ref 22–32)
Calcium: 9.4 mg/dL (ref 8.9–10.3)
Chloride: 105 mmol/L (ref 98–111)
Creatinine, Ser: 0.93 mg/dL (ref 0.44–1.00)
GFR, Estimated: >60 mL/min (ref >60)
Glucose, Bld: 210 mg/dL — ABNORMAL HIGH (ref 70–99)
Potassium: 3.7 mmol/L (ref 3.5–5.1)
Sodium: 137 mmol/L (ref 135–145)

## 2022-05-24 LAB — MAGNESIUM: Magnesium: 2.2 mg/dL (ref 1.7–2.4)

## 2022-05-24 LAB — URINE CULTURE: Culture: <10000 — AB

## 2022-05-24 LAB — CBG MONITORING, ED: Glucose-Capillary: 189 mg/dL — ABNORMAL HIGH (ref 70–99)

## 2022-05-24 MED ORDER — METHOCARBAMOL 500 MG PO TABS
500.0000 mg | ORAL_TABLET | Freq: Three times a day (TID) | ORAL | Status: DC | PRN
  Administered 2022-05-24: 500 mg via ORAL
  Filled 2022-05-24: qty 1

## 2022-05-24 MED ORDER — PREDNISONE 20 MG PO TABS
40.0000 mg | ORAL_TABLET | Freq: Every day | ORAL | Status: DC
  Filled 2022-05-24: qty 2

## 2022-05-24 MED ORDER — METHOCARBAMOL 500 MG PO TABS: 500.0000 mg | ORAL_TABLET | Freq: Three times a day (TID) | ORAL | 0 refills | Status: DC | PRN

## 2022-05-24 MED ORDER — PREDNISONE 20 MG PO TABS: 40.0000 mg | ORAL_TABLET | Freq: Every day | ORAL | 0 refills | Status: AC

## 2022-05-24 MED ORDER — INFLUENZA VAC A&B SA ADJ QUAD 0.5 ML IM PRSY: 0.5000 mL | PREFILLED_SYRINGE | INTRAMUSCULAR | Status: DC

## 2022-05-24 NOTE — Discharge Summary (Addendum)
Physician Discharge Summary   Kim Ballard:[id number] DOB: Apr 12, 1935 DOA: 05/23/2022  PCP: Tracie Harrier, MD  Admit date: 05/23/2022 Discharge date:  05/24/2022 Barriers to discharge: none  Admitted From: Home Disposition:  Home Discharging physician: Dwyane Dee, MD  Recommendations for Outpatient Follow-up:  Follow up with orthopedic surgery  Home Health: PT/OT Equipment/Devices: RW  Discharge Condition: stable CODE STATUS: Full Diet recommendation:  Diet Orders (From admission, onward)     Start     Ordered   05/24/22 0000  Diet - low sodium heart healthy        05/24/22 1454   05/24/22 0000  Diet Carb Modified        05/24/22 1454   05/23/22 1610  Diet heart healthy/carb modified Fluid consistency: Thin  Diet effective now       Question:  Fluid consistency:  Answer:  Thin   05/23/22 1609            Hospital Course:  Right gluteal strain/sciatic irritation - pain has been limiting factor since recent outpatient steroid injection -Evaluated by PT/OT during hospitalization; [place] ordered - Orthopedic surgery also consulted per patient request during hospitalization.  She is recommended for oral steroids, muscle relaxer, and rest.  She will follow-up outpatient with orthopedic surgery as well - Prednisone and Robaxin prescribed at discharge - Excessive allergy list; held off on prescribing NSAIDs although patient takes OTC pain medications  Ruled out UTI (urinary tract infection):  -UA not felt to be consistent with UTI - Patient received antibiotics during hospitalization and not discharged with further course; urine culture also did not grow   Diabetes mellitus without complication [employer]): Recent A1c 6.8, well-controlled.  Patient taking glipizide and metformin at home   Hyperlipidemia -Lopid   Hypokalemia and Hypomagnesemia - repleted   Iron deficiency anemia: Hemoglobin stable 10.9. -Continue iron supplement    The patient's chronic  medical conditions were treated accordingly per the patient's home medication regimen except as noted.  On day of discharge, patient was felt deemed stable for discharge. Patient/family member advised to call PCP or come back to ER if needed.   Principal Diagnosis: Right hip pain  Discharge Diagnoses: Active Hospital Problems   Diagnosis Date Noted   Right hip pain 05/23/2022    Priority: 1.   Diabetes mellitus without complication ([place]) 48/88/9169    Priority: 2.   Hyperlipidemia 02/19/2016    Priority: 3.   Hypokalemia 05/23/2022    Priority: 4.   Hypomagnesemia 05/23/2022    Priority: 4.   Iron deficiency anemia 02/08/2016    Priority: 5.    Resolved Hospital Problems   Diagnosis Date Noted Date Resolved   Asymptomatic bacteriuria 05/23/2022 05/24/2022    Priority: High     Discharge Instructions     Diet - low sodium heart healthy   Complete by: As directed    Diet Carb Modified   Complete by: As directed    Increase activity slowly   Complete by: As directed       Allergies as of 05/24/2022       Reactions   Biaxin [clarithromycin] Other (See Comments)   GI UPSET   Brompheniramine Maleate    Ceclor [cefaclor]    Ciprofloxacin    Clinoril [sulindac]    Codeine Sulfate    Diovan [valsartan]    Doxycycline    Able to tolerate low dose   Erythromycin    Esomeprazole    Fluoxetine Other (See Comments)   UNKNOWN  Guaifenesin & Derivatives    Levaquin [levofloxacin In D5w]    Can take in low doses   Lisinopril    Lodine [etodolac]    Macrodantin [nitrofurantoin Macrocrystal]    Medrol [methylprednisolone]    Mobic [meloxicam]    Motrin [ibuprofen]    Nexium [esomeprazole Magnesium]    Nitrofurantoin Other (See Comments)   Norvasc [amlodipine Besylate]    Omnicef [cefdinir] Other (See Comments)   Developed whelps 10 days laster   Penicillins    Prozac [fluoxetine Hcl] Other (See Comments)   Makes her feel crazy   Pseudoephedrine    Reglan  [metoclopramide]    Seldane [terfenadine]    Sulfa Antibiotics Other (See Comments)   Toprol Xl [metoprolol Tartrate]    Triamterene    Tussionex Pennkinetic Er [hydrocod Poli-chlorphe Poli Er]    Vibramycin [doxycycline Calcium]    Amlodipine Rash   Other reaction(s): Other (See Comments) Edema Edema        Medication List     TAKE these medications    cholecalciferol 25 MCG (1000 UNIT) tablet Commonly known as: VITAMIN D3 Take 1,000 Units by mouth 2 (two) times daily.   cyanocobalamin 1000 MCG tablet Commonly known as: VITAMIN B12 Take 1,000 mcg by mouth daily.   fluticasone 50 MCG/ACT nasal spray Commonly known as: FLONASE Place 2 sprays into both nostrils daily as needed for allergies.   gemfibrozil 600 MG tablet Commonly known as: LOPID Take 600 mg by mouth 2 (two) times daily before a meal.   glipiZIDE 5 MG 24 hr tablet Commonly known as: GLUCOTROL XL Take 10 mg by mouth in the morning and at bedtime.   hydrochlorothiazide 25 MG tablet Commonly known as: HYDRODIURIL Take 25 mg by mouth daily.   iron polysaccharides 150 MG capsule Commonly known as: NIFEREX Take 150 mg by mouth daily.   metFORMIN 500 MG 24 hr tablet Commonly known as: GLUCOPHAGE-XR Take 1,000 mg by mouth 2 (two) times daily.   methocarbamol 500 MG tablet Commonly known as: ROBAXIN Take 1 tablet (500 mg total) by mouth every 8 (eight) hours as needed for muscle spasms.   multivitamin with minerals tablet Take 1 tablet by mouth daily.   Omega 3 1000 MG Caps Take 1,000 mg by mouth daily.   pantoprazole 40 MG tablet Commonly known as: PROTONIX Take 40 mg by mouth daily.   predniSONE 20 MG tablet Commonly known as: DELTASONE Take 2 tablets (40 mg total) by mouth daily with breakfast for 7 days.   vitamin C 1000 MG tablet Take 1,000 mg by mouth 2 (two) times daily.   vitamin E 180 MG (400 UNITS) capsule Take 400 Units by mouth daily.               Durable Medical  Equipment  (From admission, onward)           Start     Ordered   05/24/22 1117  For home use only DME Walker rolling  Once       Question Answer Comment  Walker: With 5 Inch Wheels   Patient needs a walker to treat with the following condition Difficulty walking      05/24/22 1116            Allergies  Allergen Reactions   Biaxin [Clarithromycin] Other (See Comments)    GI UPSET    Brompheniramine Maleate    Ceclor [Cefaclor]    Ciprofloxacin    Clinoril [Sulindac]    Codeine Sulfate  Diovan [Valsartan]    Doxycycline     Able to tolerate low dose   Erythromycin    Esomeprazole    Fluoxetine Other (See Comments)    UNKNOWN   Guaifenesin & Derivatives    Levaquin [Levofloxacin In D5w]     Can take in low doses   Lisinopril    Lodine [Etodolac]    Macrodantin [Nitrofurantoin Macrocrystal]    Medrol [Methylprednisolone]    Mobic [Meloxicam]    Motrin [Ibuprofen]    Nexium [Esomeprazole Magnesium]    Nitrofurantoin Other (See Comments)   Norvasc [Amlodipine Besylate]    Omnicef [Cefdinir] Other (See Comments)    Developed whelps 10 days laster    Penicillins    Prozac [Fluoxetine Hcl] Other (See Comments)    Makes her feel crazy    Pseudoephedrine    Reglan [Metoclopramide]    Seldane [Terfenadine]    Sulfa Antibiotics Other (See Comments)   Toprol Xl [Metoprolol Tartrate]    Triamterene    Tussionex Pennkinetic Er [Hydrocod Poli-Chlorphe Poli Er]    Vibramycin [Doxycycline Calcium]    Amlodipine Rash    Other reaction(s): Other (See Comments) Edema Edema    Consultations: Orthopedic surgery  Procedures:   Discharge Exam: BP (!) 160/69 (BP Location: Right Arm)   Pulse 95   Temp 100.1 F (37.8 C)   Resp 16   Ht '5\' 7"'$  (1.702 m)   Wt 69 kg   SpO2 97%   BMI 23.82 kg/m  Physical Exam Constitutional:      General: She is not in acute distress.    Appearance: Normal appearance. She is not ill-appearing.  HENT:     Head:  Normocephalic and atraumatic.     Mouth/Throat:     Mouth: Mucous membranes are moist.  Eyes:     Extraocular Movements: Extraocular movements intact.  Cardiovascular:     Rate and Rhythm: Normal rate and regular rhythm.  Pulmonary:     Effort: Pulmonary effort is normal. No respiratory distress.     Breath sounds: Normal breath sounds. No wheezing.  Abdominal:     General: Bowel sounds are normal. There is no distension.     Palpations: Abdomen is soft.     Tenderness: There is no abdominal tenderness.  Musculoskeletal:     Cervical back: Normal range of motion and neck supple.     Comments: Active and passive ROM in right lower extremity limited by pain in right hip  Neurological:     Mental Status: She is alert.     Comments: No focal deficits.  Right lower extremity weakness attributed to pain  Psychiatric:        Mood and Affect: Mood normal.      The results of significant diagnostics from this hospitalization (including imaging, microbiology, ancillary and laboratory) are listed below for reference.   Microbiology: Recent Results (from the past 240 hour(s))  Resp panel by RT-PCR (RSV, Flu A&B, Covid) Anterior Nasal Swab     Status: None   Collection Time: 05/23/22 12:42 PM   Specimen: Anterior Nasal Swab  Result Value Ref Range Status   SARS Coronavirus 2 by RT PCR NEGATIVE NEGATIVE Final    Comment: (NOTE) SARS-CoV-2 target nucleic acids are NOT DETECTED.  The SARS-CoV-2 RNA is generally detectable in upper respiratory specimens during the acute phase of infection. The lowest concentration of SARS-CoV-2 viral copies this assay can detect is 138 copies/mL. A negative result does not preclude SARS-Cov-2 infection and should not  be used as the sole basis for treatment or other patient management decisions. A negative result may occur with  improper specimen collection/handling, submission of specimen other than nasopharyngeal swab, presence of viral mutation(s) within  the areas targeted by this assay, and inadequate number of viral copies(<138 copies/mL). A negative result must be combined with clinical observations, patient history, and epidemiological information. The expected result is Negative.  Fact Sheet for Patients:  EntrepreneurPulse.com.au  Fact Sheet for Healthcare Providers:  IncredibleEmployment.be  This test is no t yet approved or cleared by the [place] FDA and  has been authorized for detection and/or diagnosis of SARS-CoV-2 by FDA under an Emergency Use Authorization (EUA). This EUA will remain  in effect (meaning this test can be used) for the duration of the COVID-19 declaration under Section 564(b)(1) of the Act, 21 U.S.C.section 360bbb-3(b)(1), unless the authorization is terminated  or revoked sooner.       Influenza A by PCR NEGATIVE NEGATIVE Final   Influenza B by PCR NEGATIVE NEGATIVE Final    Comment: (NOTE) The Xpert Xpress SARS-CoV-2/FLU/RSV plus assay is intended as an aid in the diagnosis of influenza from Nasopharyngeal swab specimens and should not be used as a sole basis for treatment. Nasal washings and aspirates are unacceptable for Xpert Xpress SARS-CoV-2/FLU/RSV testing.  Fact Sheet for Patients: EntrepreneurPulse.com.au  Fact Sheet for Healthcare Providers: IncredibleEmployment.be  This test is not yet approved or cleared by the [place] FDA and has been authorized for detection and/or diagnosis of SARS-CoV-2 by FDA under an Emergency Use Authorization (EUA). This EUA will remain in effect (meaning this test can be used) for the duration of the COVID-19 declaration under Section 564(b)(1) of the Act, 21 U.S.C. section 360bbb-3(b)(1), unless the authorization is terminated or revoked.     Resp Syncytial Virus by PCR NEGATIVE NEGATIVE Final    Comment: (NOTE) Fact Sheet for  Patients: EntrepreneurPulse.com.au  Fact Sheet for Healthcare Providers: IncredibleEmployment.be  This test is not yet approved or cleared by the [place] FDA and has been authorized for detection and/or diagnosis of SARS-CoV-2 by FDA under an Emergency Use Authorization (EUA). This EUA will remain in effect (meaning this test can be used) for the duration of the COVID-19 declaration under Section 564(b)(1) of the Act, 21 U.S.C. section 360bbb-3(b)(1), unless the authorization is terminated or revoked.  Performed at [facility], [address] [place], [place] [address]   Urine Culture     Status: Abnormal   Collection Time: 05/23/22  1:36 PM   Specimen: Urine, Clean Catch  Result Value Ref Range Status   Specimen Description   Final    URINE, CLEAN CATCH Performed at [facility], [address], [place], [place] [address]    Special Requests   Final    NONE Performed at [facility], [address] [place], [place] [address]    Culture (A)  Final    <10,000 COLONIES/mL INSIGNIFICANT GROWTH Performed at [facility] [address] [place], [place] [address]    Report Status 05/24/2022 FINAL  Final  Blood culture (routine x 2)     Status: None (Preliminary result)   Collection Time: 05/23/22  5:00 PM   Specimen: BLOOD  Result Value Ref Range Status   Specimen Description BLOOD RIGHT ANTECUBITAL  Final   Special Requests   Final    BOTTLES DRAWN AEROBIC AND ANAEROBIC Blood Culture adequate volume   Culture   Final  NO GROWTH < 24 HOURS Performed at [facility], [address] [place], [place] [address]    Report Status PENDING  Incomplete  Blood culture (routine x 2)     Status: None (Preliminary result)   Collection Time: 05/23/22  5:09 PM   Specimen: BLOOD  Result Value Ref Range Status   Specimen Description BLOOD LEFT ANTECUBITAL  Final   Special Requests   Final     BOTTLES DRAWN AEROBIC AND ANAEROBIC Blood Culture adequate volume   Culture   Final    NO GROWTH < 24 HOURS Performed at [facility], [address] [place], [place] [address]    Report Status PENDING  Incomplete     Labs: BNP (last 3 results) No results for input(s): "BNP" in the last 8760 hours. Basic Metabolic Panel: Recent Labs  Lab 05/23/22 1242 05/24/22 0519  NA 138 137  K 3.4* 3.7  CL 101 105  CO2 25 26  GLUCOSE 180* 210*  BUN 29* 24*  CREATININE 0.97 0.93  CALCIUM 10.2 9.4  MG 1.6* 2.2   Liver Function Tests: Recent Labs  Lab 05/23/22 1242  AST 10*  ALT 11  ALKPHOS 90  BILITOT 0.8  PROT 7.6  ALBUMIN 4.0   No results for input(s): "LIPASE", "AMYLASE" in the last 168 hours. No results for input(s): "AMMONIA" in the last 168 hours. CBC: Recent Labs  Lab 05/23/22 1242 05/24/22 0519  WBC 8.8 8.6  NEUTROABS 7.4  --   HGB 10.9* 9.6*  HCT 33.0* 29.5*  MCV 94.3 94.2  PLT 147* 144*   Cardiac Enzymes: No results for input(s): "CKTOTAL", "CKMB", "CKMBINDEX", "TROPONINI" in the last 168 hours. BNP: Invalid input(s): "POCBNP" CBG: Recent Labs  Lab 05/23/22 1147 05/23/22 2151 05/24/22 0745 05/24/22 1228  GLUCAP 176* 206* 189* 282*   D-Dimer No results for input(s): "DDIMER" in the last 72 hours. Hgb A1c No results for input(s): "HGBA1C" in the last 72 hours. Lipid Profile No results for input(s): "CHOL", "HDL", "LDLCALC", "TRIG", "CHOLHDL", "LDLDIRECT" in the last 72 hours. Thyroid function studies No results for input(s): "TSH", "T4TOTAL", "T3FREE", "THYROIDAB" in the last 72 hours.  Invalid input(s): "FREET3" Anemia work up No results for input(s): "VITAMINB12", "FOLATE", "FERRITIN", "TIBC", "IRON", "RETICCTPCT" in the last 72 hours. Urinalysis    Component Value Date/Time   COLORURINE YELLOW (A) 05/23/2022 1337   APPEARANCEUR HAZY (A) 05/23/2022 1337   APPEARANCEUR Clear 06/14/2018 1519   LABSPEC 1.017 05/23/2022 1337    PHURINE 5.0 05/23/2022 1337   GLUCOSEU NEGATIVE 05/23/2022 1337   HGBUR NEGATIVE 05/23/2022 1337   BILIRUBINUR NEGATIVE 05/23/2022 1337   BILIRUBINUR Negative 06/14/2018 1519   KETONESUR NEGATIVE 05/23/2022 1337   PROTEINUR 100 (A) 05/23/2022 1337   NITRITE NEGATIVE 05/23/2022 1337   LEUKOCYTESUR SMALL (A) 05/23/2022 1337   Sepsis Labs Recent Labs  Lab 05/23/22 1242 05/24/22 0519  WBC 8.8 8.6   Microbiology Recent Results (from the past 240 hour(s))  Resp panel by RT-PCR (RSV, Flu A&B, Covid) Anterior Nasal Swab     Status: None   Collection Time: 05/23/22 12:42 PM   Specimen: Anterior Nasal Swab  Result Value Ref Range Status   SARS Coronavirus 2 by RT PCR NEGATIVE NEGATIVE Final    Comment: (NOTE) SARS-CoV-2 target nucleic acids are NOT DETECTED.  The SARS-CoV-2 RNA is generally detectable in upper respiratory specimens during the acute phase of infection. The lowest concentration of SARS-CoV-2 viral copies this assay can detect is 138 copies/mL.  A negative result does not preclude SARS-Cov-2 infection and should not be used as the sole basis for treatment or other patient management decisions. A negative result may occur with  improper specimen collection/handling, submission of specimen other than nasopharyngeal swab, presence of viral mutation(s) within the areas targeted by this assay, and inadequate number of viral copies(<138 copies/mL). A negative result must be combined with clinical observations, patient history, and epidemiological information. The expected result is Negative.  Fact Sheet for Patients:  EntrepreneurPulse.com.au  Fact Sheet for Healthcare Providers:  IncredibleEmployment.be  This test is no t yet approved or cleared by the [place] FDA and  has been authorized for detection and/or diagnosis of SARS-CoV-2 by FDA under an Emergency Use Authorization (EUA). This EUA will remain  in effect (meaning this  test can be used) for the duration of the COVID-19 declaration under Section 564(b)(1) of the Act, 21 U.S.C.section 360bbb-3(b)(1), unless the authorization is terminated  or revoked sooner.       Influenza A by PCR NEGATIVE NEGATIVE Final   Influenza B by PCR NEGATIVE NEGATIVE Final    Comment: (NOTE) The Xpert Xpress SARS-CoV-2/FLU/RSV plus assay is intended as an aid in the diagnosis of influenza from Nasopharyngeal swab specimens and should not be used as a sole basis for treatment. Nasal washings and aspirates are unacceptable for Xpert Xpress SARS-CoV-2/FLU/RSV testing.  Fact Sheet for Patients: EntrepreneurPulse.com.au  Fact Sheet for Healthcare Providers: IncredibleEmployment.be  This test is not yet approved or cleared by the [place] FDA and has been authorized for detection and/or diagnosis of SARS-CoV-2 by FDA under an Emergency Use Authorization (EUA). This EUA will remain in effect (meaning this test can be used) for the duration of the COVID-19 declaration under Section 564(b)(1) of the Act, 21 U.S.C. section 360bbb-3(b)(1), unless the authorization is terminated or revoked.     Resp Syncytial Virus by PCR NEGATIVE NEGATIVE Final    Comment: (NOTE) Fact Sheet for Patients: EntrepreneurPulse.com.au  Fact Sheet for Healthcare Providers: IncredibleEmployment.be  This test is not yet approved or cleared by the [place] FDA and has been authorized for detection and/or diagnosis of SARS-CoV-2 by FDA under an Emergency Use Authorization (EUA). This EUA will remain in effect (meaning this test can be used) for the duration of the COVID-19 declaration under Section 564(b)(1) of the Act, 21 U.S.C. section 360bbb-3(b)(1), unless the authorization is terminated or revoked.  Performed at [facility], [address] [place], [place] [address]   Urine Culture     Status:  Abnormal   Collection Time: 05/23/22  1:36 PM   Specimen: Urine, Clean Catch  Result Value Ref Range Status   Specimen Description   Final    URINE, CLEAN CATCH Performed at [facility], [address], [place], [place] [address]    Special Requests   Final    NONE Performed at [facility], [address], [place], [place] [address]    Culture (A)  Final    <10,000 COLONIES/mL INSIGNIFICANT GROWTH Performed at [place] [address], [place],  [address]    Report Status 05/24/2022 FINAL  Final  Blood culture (routine x 2)     Status: None (Preliminary result)   Collection Time: 05/23/22  5:00 PM   Specimen: BLOOD  Result Value Ref Range Status   Specimen Description BLOOD RIGHT ANTECUBITAL  Final   Special Requests   Final    BOTTLES DRAWN AEROBIC AND ANAEROBIC Blood Culture  adequate volume   Culture   Final    NO GROWTH < 24 HOURS Performed at [facility], [address] [place], [place] [address]    Report Status PENDING  Incomplete  Blood culture (routine x 2)     Status: None (Preliminary result)   Collection Time: 05/23/22  5:09 PM   Specimen: BLOOD  Result Value Ref Range Status   Specimen Description BLOOD LEFT ANTECUBITAL  Final   Special Requests   Final    BOTTLES DRAWN AEROBIC AND ANAEROBIC Blood Culture adequate volume   Culture   Final    NO GROWTH < 24 HOURS Performed at [facility], [address], [place], [place] [address]    Report Status PENDING  Incomplete    Procedures/Studies: CT ABDOMEN PELVIS W CONTRAST  Result Date: 05/23/2022 CLINICAL DATA:  Pain right lower quadrant of abdomen EXAM: CT ABDOMEN AND PELVIS WITH CONTRAST TECHNIQUE: Multidetector CT imaging of the abdomen and pelvis was performed using the standard protocol following bolus administration of intravenous contrast. RADIATION DOSE REDUCTION: This exam was performed according to the departmental dose-optimization program  which includes automated exposure control, adjustment of the mA and/or kV according to patient size and/or use of iterative reconstruction technique. CONTRAST:  167m OMNIPAQUE IOHEXOL 300 MG/ML  SOLN COMPARISON:  04/01/2022 FINDINGS: Lower chest: Small linear density in right lower lung field near the dome of right hemidiaphragm may suggest minimal scarring. No focal infiltrates are seen in the lower lung fields. There is no pleural effusion. Hepatobiliary: No focal abnormalities are seen in liver. Surgical clips are seen in gallbladder fossa. Pancreas: No focal abnormalities are seen. Spleen: Unremarkable. Adrenals/Urinary Tract: Adrenals are unremarkable. There is no hydronephrosis. There are no renal or ureteral stones. There are few small smooth marginated low-density structures in renal cortex on both sides, possibly renal cysts largest measuring 9 mm in size. Urinary bladder is unremarkable. Stomach/Bowel: There is a ring-like structure around the gastroesophageal junction with no interval change. Stomach is unremarkable. Small bowel loops are not dilated. The appendix is not seen. Scattered diverticula are seen in colon without signs of focal acute diverticulitis. Moderate to large amount of stool is seen in the lumen of rectum. Transverse diameter of rectum measures 7.3 cm. Vascular/Lymphatic: Scattered arterial calcifications are seen. Reproductive: Uterus is not seen.  There are no adnexal masses. Other: There is no ascites or pneumoperitoneum. Small umbilical hernia containing fat is seen. Left inguinal hernia containing fat is seen. Musculoskeletal: Mild decrease in height of upper endplate of body of L3 vertebra has not changed. Degenerative changes are noted with disc space narrowing, bony spurs and facet hypertrophy at multiple levels. Degenerative changes are noted in both hips. IMPRESSION: There is no evidence of intestinal obstruction or pneumoperitoneum. There is no hydronephrosis. Diverticulosis  of colon without signs of diverticulitis. Large amount of stool is seen in rectum suggesting fecal impaction. Renal cysts. Other chronic findings as described in the body of the report. Electronically Signed   By: PElmer PickerM.D.   On: 05/23/2022 15:33   CT Hip Right Wo Contrast  Result Date: 05/23/2022 CLINICAL DATA:  Hip pain, stress fracture suspected, neg xray EXAM: CT OF THE RIGHT HIP WITHOUT CONTRAST TECHNIQUE: Multidetector CT imaging of the right hip was performed according to the standard protocol. Multiplanar CT image reconstructions were also generated. RADIATION DOSE REDUCTION: This exam was performed according to the departmental dose-optimization program which includes automated exposure control, adjustment of the mA and/or  kV according to patient size and/or use of iterative reconstruction technique. COMPARISON:  Same day hip radiograph, CT 04/01/2022 FINDINGS: Bones/Joint/Cartilage There is no evidence of acute fracture. Alignment is normal. There is moderate hip osteoarthritis with chondrocalcinosis and joint space narrowing posteriorly. There are dystrophic calcifications at the pubic symphysis. There is no significant joint effusion. Ligaments Suboptimally assessed by CT. Muscles and Tendons No acute myotendinous abnormality. Soft tissues No focal fluid collection. IMPRESSION: No evidence of acute right hip fracture. Moderate right hip osteoarthritis. Electronically Signed   By: Maurine Simmering M.D.   On: 05/23/2022 15:26   DG Chest 1 View  Result Date: 05/23/2022 CLINICAL DATA:  Cough, congestion, RIGHT hip pain EXAM: CHEST  1 VIEW COMPARISON:  Portable exam 1510 hours compared to 12/13/2006 FINDINGS: Borderline enlargement of cardiac silhouette. Mediastinal contours and pulmonary vascularity normal. Atherosclerotic calcification aorta. Lungs clear. No pulmonary infiltrate, pleural effusion, or pneumothorax. Bones demineralized. IMPRESSION: No acute abnormalities. Aortic  Atherosclerosis (ICD10-I70.0). Electronically Signed   By: Lavonia Dana M.D.   On: 05/23/2022 15:22   DG Hip Unilat W or Wo Pelvis 2-3 Views Right  Result Date: 05/23/2022 CLINICAL DATA:  Acute right hip pain.  No known injury. EXAM: DG HIP (WITH OR WITHOUT PELVIS) 2-3V RIGHT COMPARISON:  None Available. FINDINGS: There is no evidence of hip fracture or dislocation. There is no evidence of arthropathy or other focal bone abnormality. IMPRESSION: Negative. Electronically Signed   By: Marijo Conception M.D.   On: 05/23/2022 13:23     Time coordinating discharge: Over 30 minutes    Dwyane Dee, MD  [facility] 05/24/2022, 4:50 PM

## 2022-05-24 NOTE — Progress Notes (Signed)
Reviewed discharge instructions with pt. Pt verbalized understanding. Pt discharged with a rolling walker and all personal belongings. Pt transported to home via EMS.

## 2022-05-24 NOTE — Evaluation (Signed)
Occupational Therapy Evaluation Patient Details Name: Kim Ballard MRN: [id number] DOB: 10-10-1934 Today's Date: 05/24/2022   History of Present Illness Kim Ballard is a 87 y.o. female with medical history significant of hypertension, hyperlipidemia, diabetes mellitus, GERD, depression, IBS, melanoma, anemia, fibromyalgia, who presents with right hip pain, dysuria and fever.   Clinical Impression   Patient presenting with decreased Ind in self care,balance, functional mobility/transfers, endurance, and safety awareness. Patient reports living at home alone and being independent at baseline. 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  Pt reports 10/10 sharp pain in R hip where she had injection throughout session. Pain does not increase/decrease with mobility. Pt demonstrates functional mobility and self care tasks in  sitting and standing with use of RW at supervision level. Min cuing for hand placement and technique. Pt reports she has neighbors that can assist her at discharge. Neighbor has been bringing her groceries this past week.  Patient currently functioning at supervision overall throughout session with RW. Patient will benefit from acute OT to increase overall independence in the areas of ADLs, functional mobility, and safety awareness in order to safely discharge home.      Recommendations for follow up therapy are one component of a multi-disciplinary discharge planning process, led by the attending physician.  Recommendations may be updated based on patient status, additional functional criteria and insurance authorization.   Follow Up Recommendations  Home health OT     Assistance Recommended at Discharge Intermittent Supervision/Assistance  Patient can return home with the following A little help with bathing/dressing/bathroom;Assistance with cooking/housework;Help with stairs or ramp for entrance;Assist for transportation    Functional Status Assessment  Patient has had  a recent decline in their functional status and demonstrates the ability to make significant improvements in function in a reasonable and predictable amount of time.  Equipment Recommendations  BSC/3in1;Other (comment) (RW)       Precautions / Restrictions Precautions Precautions: Fall Restrictions Weight Bearing Restrictions: No      Mobility Bed Mobility               General bed mobility comments: seated in recliner chair at beginning/end of session    Transfers Overall transfer level: Needs assistance Equipment used: Rolling walker (2 wheels) Transfers: Sit to/from Stand, Bed to chair/wheelchair/BSC Sit to Stand: Supervision     Step pivot transfers: Supervision     General transfer comment: cuing for hand placement and technique but no physical system      Balance Overall balance assessment: Needs assistance Sitting-balance support: Feet supported Sitting balance-Leahy Scale: Good     Standing balance support: Reliant on assistive device for balance, Bilateral upper extremity supported, During functional activity Standing balance-Leahy Scale: Fair                             ADL either performed or assessed with clinical judgement   ADL                                         General ADL Comments: supervision overall with use of RW and increased time to complete tasks secondary to increase pain     Vision Patient Visual Report: No change from baseline              Pertinent Vitals/Pain Pain Assessment Pain Assessment: 0-10 Pain Score:  10-Worst pain ever Pain Location: R hip Pain Descriptors / Indicators: Tender, Sharp Pain Intervention(s): Limited activity within patient's tolerance, Premedicated before session, Repositioned     Hand Dominance Right   Extremity/Trunk Assessment Upper Extremity Assessment Upper Extremity Assessment: Overall WFL for tasks assessed   Lower Extremity Assessment Lower Extremity  Assessment: Defer to PT evaluation       Communication Communication Communication: No difficulties   Cognition Arousal/Alertness: Awake/alert Behavior During Therapy: WFL for tasks assessed/performed Overall Cognitive Status: Within Functional Limits for tasks assessed                                                  Home Living Family/patient expects to be discharged to:: Private residence Living Arrangements: Alone Available Help at Discharge: Friend(s);Neighbor;Available PRN/intermittently Type of Home: House       Home Layout: One level     Bathroom Shower/Tub: Teacher, early years/pre: Standard     Home Equipment: None          Prior Functioning/Environment Prior Level of Function : Independent/Modified Independent;Driving               ADLs Comments: Pt reports being very active and Ind at baseline. 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        OT Problem List: Decreased strength;Pain;Decreased activity tolerance;Decreased safety awareness;Impaired balance (sitting and/or standing);Decreased knowledge of use of DME or AE      OT Treatment/Interventions: Self-care/ADL training;Therapeutic exercise;Therapeutic activities;DME and/or AE instruction;Patient/family education;Balance training    OT Goals(Current goals can be found in the care plan section) Acute Rehab OT Goals Patient Stated Goal: to decrease pain and go home OT Goal Formulation: With patient Time For Goal Achievement: 06/07/22 Potential to Achieve Goals: Good ADL Goals Pt Will Perform Grooming: with modified independence;standing Pt Will Perform Lower Body Dressing: with modified independence;sit to/from stand Pt Will Transfer to Toilet: with modified independence;ambulating Pt Will Perform Toileting - Clothing Manipulation and hygiene: with modified independence;sit to/from stand  OT Frequency: Min 2X/week       AM-PAC OT "6 Clicks" Daily  Activity     Outcome Measure Help from another person eating meals?: None Help from another person taking care of personal grooming?: None Help from another person toileting, which includes using toliet, bedpan, or urinal?: None Help from another person bathing (including washing, rinsing, drying)?: None Help from another person to put on and taking off regular upper body clothing?: None Help from another person to put on and taking off regular lower body clothing?: None 6 Click Score: 24   End of Session Equipment Utilized During Treatment: Rolling walker (2 wheels) Nurse Communication: Mobility status  Activity Tolerance: Patient tolerated treatment well Patient left: in chair;with call bell/phone within reach;with chair alarm set  OT Visit Diagnosis: Unsteadiness on feet (R26.81);Muscle weakness (generalized) (M62.81)                Time: 1040-1101 OT Time Calculation (min): 21 min Charges:  OT General Charges $OT Visit: 1 Visit OT Evaluation $OT Eval Moderate Complexity: 1 Mod OT Treatments $Self Care/Home Management : 8-22 mins Darleen Crocker, MS, OTR/L , CBIS ascom [phone]  05/24/22, 1:10 PM

## 2022-05-24 NOTE — Consult Note (Signed)
ORTHOPAEDIC CONSULTATION  REQUESTING PHYSICIAN: Dwyane Dee, MD  Chief Complaint: right hip pain  HPI: Kim Ballard is a 87 y.o. female who complains of right hip and buttock pain. The pain is sharp in character. The pain is severe and 10/10. The pain is worse with movement and better with rest. Denies any numbness, tingling or constitutional symptoms.  Past Medical History:  Diagnosis Date   Allergic state    Anemia    Arthritis    Cancer ([place])    skin cancers   Chronic cystitis with hematuria    Depression    Diabetes mellitus without complication ([place])    Fibrocystic disease of both breasts    Fibromyalgia    GERD (gastroesophageal reflux disease)    Hypertension    IBS (irritable bowel syndrome)    Melanoma ([place]) 07/09/2021   lt neck/shoulder area   Melanoma of neck ([place]) 09/06/2021   left   Nausea    Skin cancer    Wears dentures    full upper and lower   Past Surgical History:  Procedure Laterality Date   ABDOMINAL HYSTERECTOMY     APPENDECTOMY     BREAST BIOPSY Right 2001   surgical bx    CATARACT EXTRACTION W/PHACO Right 03/08/2017   Procedure: CATARACT EXTRACTION PHACO AND INTRAOCULAR LENS PLACEMENT ([place]) RIGHT DIABETIC TORIC;  Surgeon: Leandrew Koyanagi, MD;  Location: [place];  Service: Ophthalmology;  Laterality: Right;  Diabetic - oral meds   CATARACT EXTRACTION W/PHACO Left 04/17/2017   Procedure: CATARACT EXTRACTION PHACO AND INTRAOCULAR LENS PLACEMENT ([place]) LEFT DIABETIC;  Surgeon: Leandrew Koyanagi, MD;  Location: [place];  Service: Ophthalmology;  Laterality: Left;  diabetic-oral meds   CHOLECYSTECTOMY     COLONOSCOPY     COLONOSCOPY WITH PROPOFOL N/A 08/27/2015   Procedure: COLONOSCOPY WITH PROPOFOL;  Surgeon: Manya Silvas, MD;  Location: [facility] ENDOSCOPY;  Service: Endoscopy;  Laterality: N/A;   COLONOSCOPY WITH PROPOFOL N/A 05/02/2016   Procedure: COLONOSCOPY WITH PROPOFOL;  Surgeon: Manya Silvas, MD;   Location: [facility] ENDOSCOPY;  Service: Endoscopy;  Laterality: N/A;   ESOPHAGOGASTRODUODENOSCOPY     ESOPHAGOGASTRODUODENOSCOPY (EGD) WITH PROPOFOL N/A 08/27/2015   Procedure: ESOPHAGOGASTRODUODENOSCOPY (EGD) WITH PROPOFOL;  Surgeon: Manya Silvas, MD;  Location: [facility] ENDOSCOPY;  Service: Endoscopy;  Laterality: N/A;   HERNIA REPAIR     Social History   Socioeconomic History   Marital status: Widowed    Spouse name: Not on file   Number of children: Not on file   Years of education: Not on file   Highest education level: Not on file  Occupational History   Not on file  Tobacco Use   Smoking status: Former    Types: Cigarettes    Quit date: 11    Years since quitting: 64.0   Smokeless tobacco: Never  Vaping Use   Vaping Use: Never used  Substance and Sexual Activity   Alcohol use: No   Drug use: No   Sexual activity: Not Currently  Other Topics Concern   Not on file  Social History Narrative   Not on file   Social Determinants of Health   Financial Resource Strain: Not on file  Food Insecurity: No Food Insecurity (05/24/2022)   Hunger Vital Sign    Worried About Running Out of Food in the Last Year: Never true    Ran Out of Food in the Last Year: Never true  Transportation Needs: No Transportation Needs (05/24/2022)   PRAPARE - Transportation  Lack of Transportation (Medical): No    Lack of Transportation (Non-Medical): No  Physical Activity: Not on file  Stress: Not on file  Social Connections: Not on file   Family History  Problem Relation Age of Onset   Hypertension Mother    Ovarian cancer Paternal Grandmother    COPD Father    Hyperlipidemia Father    Kidney disease Father    Diabetes Maternal Grandmother    Breast cancer Neg Hx    Allergies  Allergen Reactions   Biaxin [Clarithromycin] Other (See Comments)    GI UPSET    Brompheniramine Maleate    Ceclor [Cefaclor]    Ciprofloxacin    Clinoril [Sulindac]    Codeine Sulfate    Diovan  [Valsartan]    Doxycycline     Able to tolerate low dose   Erythromycin    Esomeprazole    Fluoxetine Other (See Comments)    UNKNOWN   Guaifenesin & Derivatives    Levaquin [Levofloxacin In D5w]     Can take in low doses   Lisinopril    Lodine [Etodolac]    Macrodantin [Nitrofurantoin Macrocrystal]    Medrol [Methylprednisolone]    Mobic [Meloxicam]    Motrin [Ibuprofen]    Nexium [Esomeprazole Magnesium]    Nitrofurantoin Other (See Comments)   Norvasc [Amlodipine Besylate]    Omnicef [Cefdinir] Other (See Comments)    Developed whelps 10 days laster    Penicillins    Prozac [Fluoxetine Hcl] Other (See Comments)    Makes her feel crazy    Pseudoephedrine    Reglan [Metoclopramide]    Seldane [Terfenadine]    Sulfa Antibiotics Other (See Comments)   Toprol Xl [Metoprolol Tartrate]    Triamterene    Tussionex Pennkinetic Er [Hydrocod Poli-Chlorphe Poli Er]    Vibramycin [Doxycycline Calcium]    Amlodipine Rash    Other reaction(s): Other (See Comments) Edema Edema   Prior to Admission medications   Medication Sig Start Date End Date Taking? Authorizing Provider  Ascorbic Acid (VITAMIN C) 1000 MG tablet Take 1,000 mg by mouth 2 (two) times daily.   Yes [provider]  cholecalciferol (VITAMIN D3) 25 MCG (1000 UT) tablet Take 1,000 Units by mouth 2 (two) times daily.   Yes [provider]  cyanocobalamin (VITAMIN B12) 1000 MCG tablet Take 1,000 mcg by mouth daily.   Yes [provider]  fluticasone (FLONASE) 50 MCG/ACT nasal spray Place 2 sprays into both nostrils daily as needed for allergies.   Yes [provider]  gemfibrozil (LOPID) 600 MG tablet Take 600 mg by mouth 2 (two) times daily before a meal.   Yes [provider]  glipiZIDE (GLUCOTROL XL) 5 MG 24 hr tablet Take 10 mg by mouth in the morning and at bedtime.   Yes [provider]  hydrochlorothiazide (HYDRODIURIL) 25 MG tablet Take 25 mg by mouth daily.    Yes [provider]  iron polysaccharides (NIFEREX) 150 MG capsule Take 150 mg by mouth daily.   Yes [provider]  metFORMIN (GLUCOPHAGE-XR) 500 MG 24 hr tablet Take 1,000 mg by mouth 2 (two) times daily. 04/14/22  Yes [provider]  Multiple Vitamins-Minerals (MULTIVITAMIN WITH MINERALS) tablet Take 1 tablet by mouth daily.   Yes [provider]  Omega 3 1000 MG CAPS Take 1,000 mg by mouth daily.   Yes [provider]  pantoprazole (PROTONIX) 40 MG tablet Take 40 mg by mouth daily.   Yes [provider]  vitamin E 180 MG (400 UNITS) capsule Take 400 Units by mouth daily.   Yes [provider]   CT ABDOMEN PELVIS W CONTRAST  Result Date: 05/23/2022 CLINICAL DATA:  Pain right lower quadrant of abdomen EXAM: CT ABDOMEN AND PELVIS WITH CONTRAST TECHNIQUE: Multidetector CT imaging of the abdomen and pelvis was performed using the standard protocol following bolus administration of intravenous contrast. RADIATION DOSE REDUCTION: This exam was performed according to the departmental dose-optimization program which includes automated exposure control, adjustment of the mA and/or kV according to patient size and/or use of iterative reconstruction technique. CONTRAST:  115m OMNIPAQUE IOHEXOL 300 MG/ML  SOLN COMPARISON:  04/01/2022 FINDINGS: Lower chest: Small linear density in right lower lung field near the dome of right hemidiaphragm may suggest minimal scarring. No focal infiltrates are seen in the lower lung fields. There is no pleural effusion. Hepatobiliary: No focal abnormalities are seen in liver. Surgical clips are seen in gallbladder fossa. Pancreas: No focal abnormalities are seen. Spleen: Unremarkable. Adrenals/Urinary Tract: Adrenals are unremarkable. There is no hydronephrosis. There are no renal or ureteral stones. There are few small smooth marginated low-density structures in renal cortex on both sides, possibly renal cysts  largest measuring 9 mm in size. Urinary bladder is unremarkable. Stomach/Bowel: There is a ring-like structure around the gastroesophageal junction with no interval change. Stomach is unremarkable. Small bowel loops are not dilated. The appendix is not seen. Scattered diverticula are seen in colon without signs of focal acute diverticulitis. Moderate to large amount of stool is seen in the lumen of rectum. Transverse diameter of rectum measures 7.3 cm. Vascular/Lymphatic: Scattered arterial calcifications are seen. Reproductive: Uterus is not seen.  There are no adnexal masses. Other: There is no ascites or pneumoperitoneum. Small umbilical hernia containing fat is seen. Left inguinal hernia containing fat is seen. Musculoskeletal: Mild decrease in height of upper endplate of body of L3 vertebra has not changed. Degenerative changes are noted with disc space narrowing, bony spurs and facet hypertrophy at multiple levels. Degenerative changes are noted in both hips. IMPRESSION: There is no evidence of intestinal obstruction or pneumoperitoneum. There is no hydronephrosis. Diverticulosis of colon without signs of diverticulitis. Large amount of stool is seen in rectum suggesting fecal impaction. Renal cysts. Other chronic findings as described in the body of the report. Electronically Signed   By: PElmer PickerM.D.   On: 05/23/2022 15:33   CT Hip Right Wo Contrast  Result Date: 05/23/2022 CLINICAL DATA:  Hip pain, stress fracture suspected, neg xray EXAM: CT OF THE RIGHT HIP WITHOUT CONTRAST TECHNIQUE: Multidetector CT imaging of the right hip was performed according to the standard protocol. Multiplanar CT image reconstructions were also generated. RADIATION DOSE REDUCTION: This exam was performed according to the departmental dose-optimization program which includes automated exposure control, adjustment of the mA and/or kV according to patient size and/or use of iterative reconstruction technique.  COMPARISON:  Same day hip radiograph, CT 04/01/2022 FINDINGS: Bones/Joint/Cartilage There is no evidence of acute fracture. Alignment is normal. There is moderate hip osteoarthritis with chondrocalcinosis and joint space narrowing posteriorly. There are dystrophic calcifications at the pubic symphysis. There is no significant joint effusion. Ligaments Suboptimally assessed by CT. Muscles and Tendons No acute myotendinous abnormality. Soft tissues No focal fluid collection. IMPRESSION: No evidence of acute right hip fracture. Moderate right hip osteoarthritis. Electronically Signed   By: JMaurine SimmeringM.D.   On: 05/23/2022 15:26   DG Chest 1 View  Result Date: 05/23/2022 CLINICAL DATA:  Cough, congestion, RIGHT hip pain EXAM: CHEST  1 VIEW COMPARISON:  Portable exam 1510 hours compared to 12/13/2006 FINDINGS: Borderline enlargement of cardiac silhouette. Mediastinal contours and pulmonary vascularity normal. Atherosclerotic calcification aorta. Lungs clear. No pulmonary infiltrate, pleural effusion, or pneumothorax. Bones demineralized. IMPRESSION: No acute abnormalities. Aortic Atherosclerosis (ICD10-I70.0). Electronically Signed   By: Lavonia Dana M.D.   On: 05/23/2022 15:22   DG Hip Unilat W or Wo Pelvis 2-3 Views Right  Result Date: 05/23/2022 CLINICAL DATA:  Acute right hip pain.  No known injury. EXAM: DG HIP (WITH OR WITHOUT PELVIS) 2-3V RIGHT COMPARISON:  None Available. FINDINGS: There is no evidence of hip fracture or dislocation. There is no evidence of arthropathy or other focal bone abnormality. IMPRESSION: Negative. Electronically Signed   By: Marijo Conception M.D.   On: 05/23/2022 13:23    Positive ROS: All other systems have been reviewed and were otherwise negative with the exception of those mentioned in the HPI and as above.  Physical Exam: General: Alert, no acute distress Cardiovascular: No pedal edema Respiratory: No cyanosis, no use of accessory musculature GI: No organomegaly,  abdomen is soft and non-tender Skin: No lesions in the area of chief complaint Neurologic: Sensation intact distally Psychiatric: Patient is competent for consent with normal mood and affect Lymphatic: No axillary or cervical lymphadenopathy  MUSCULOSKELETAL: right posterior hip/buttock area with small bruise at site of injection. No erythema, mass, warmth or drainage is appreciated. Compartments soft. Good cap refill. Motor and sensory intact distally.  Assessment: Right gluteal strain, sciatic irritation  Plan: Plan symptomatic treatment with oral steroids, muscle relaxer, walker and rest. She may follow up with me in 7 to 10 days. Please call with questions.    Lovell Sheehan, MD    05/24/2022 1:52 PM

## 2022-05-24 NOTE — Plan of Care (Signed)

## 2022-05-24 NOTE — Progress Notes (Signed)
Set up EMS transportation.

## 2022-05-24 NOTE — TOC Progression Note (Signed)
Transition of Care [facility]) - Progression Note    Patient Details  Name: Kim Ballard MRN: [id number] Date of Birth: 10/26/1934  [facility] of [facility] CM/SW Contact  Ross Ludwig, [place] Phone Number: 05/24/2022, 5:26 PM  Clinical Narrative:     Damaris Schooner to [facility] bedside nurse, she will contact EMS for patient once patient is ready for discharge.  This Probation officer completed Med Necessity form and provided bedside nurse [facility] EMS phone number which is [phone].       Expected Discharge Plan and Services           Expected Discharge Date: 05/24/22                                     Social Determinants of Health (SDOH) Interventions Housing Interventions: Intervention Not Indicated  Readmission Risk Interventions     No data to display

## 2022-05-24 NOTE — Discharge Instructions (Signed)
Patient expressed allergic to medication. Please follow up with your primary doctor.

## 2022-05-24 NOTE — Progress Notes (Signed)
Pt to discharge with HHOTPT with [facility]. Pt wants EMS home, however, it wasn't recommended by PT/OT. This information was relayed to pt and she's agreeable to EMS transport back home.

## 2022-05-24 NOTE — Evaluation (Addendum)
Physical Therapy Evaluation Patient Details Name: Kim Ballard MRN: [id number] DOB: 12-21-1934 Today's Date: 05/24/2022  History of Present Illness  Kim Ballard is a 87 y.o. female with medical history significant of hypertension, hyperlipidemia, diabetes mellitus, GERD, depression, IBS, melanoma, anemia, fibromyalgia, who presents with right hip pain (recent steriod injection), dysuria and fever; admitted for management of UTI. R hip imaging negative for acute hip pathology.  Clinical Impression  Patient resting in bed upon arrival to room; alert and oriented, follows commands and agreeable to participation with session.  Continues to rate R hip pain 9-10/10; meds received prior to session.  Small reddened area to R hip (previous injection site), but no significant warmth/erythema appreciated.  R hip strength (3-/5) and ROM (approx 50-75% normal range) generally limited by pain in hip with movement; often requiring self-assist (from bilat UEs) to mobilize R LE against-gravity.  Denies numbness, paresthesia or radicular symptoms.   Able to complete bed mobility with mod indep; sit/stand, basic transfers and gait (25' x1, 40' x1) with RW, close sup/min assist.  Demonstrates 3-point, step to gait pattern with very heavy WBing bilat UEs; maintains bilat knee flexion throughout gait cycle (R > L), partially improved with verbal cuing-but no overt buckling.  Very slow and deliberate; consistent 9-10/10 pain rating to R LE (unchanged with mobility/WBing) Would benefit from skilled PT to address above deficits and promote optimal return to PLOF.; Recommend transition to HHPT upon discharge from acute hospitalization.      Recommendations for follow up therapy are one component of a multi-disciplinary discharge planning process, led by the attending physician.  Recommendations may be updated based on patient status, additional functional criteria and insurance authorization.  Follow Up Recommendations  Home health PT      Assistance Recommended at Discharge PRN  Patient can return home with the following  A little help with walking and/or transfers;A little help with bathing/dressing/bathroom    Equipment Recommendations Rolling walker (2 wheels)  Recommendations for Other Services       Functional Status Assessment Patient has had a recent decline in their functional status and demonstrates the ability to make significant improvements in function in a reasonable and predictable amount of time.     Precautions / Restrictions Precautions Precautions: Fall Restrictions Weight Bearing Restrictions: No      Mobility  Bed Mobility Overal bed mobility: Modified Independent             General bed mobility comments: self-assists R LE out of bed with bilat UEs (due to pain)    Transfers Overall transfer level: Needs assistance Equipment used: Rolling walker (2 wheels) Transfers: Sit to/from Stand Sit to Stand: Min assist, Min guard           General transfer comment: progressive increase in indep with repetition (and trials from various surfaces); does require min assist from standard toilet height. Very heavily dependent on UEs to complete movement transition; limited active use of R LE noted    Ambulation/Gait Ambulation/Gait assistance: Min guard, Supervision Gait Distance (Feet):  (25' x1, 40' x1) Assistive device: Rolling walker (2 wheels)         General Gait Details: 3-point, step to gait pattern with very heavy WBing bilat UEs; maintains bilat knee flexion throughout gait cycle (R > L), partially improved with verbal cuing-but no overt buckling.  Very slow and deliberate; consistent 9-10/10 pain rating to R LE (unchanged with mobility/WBing)  Stairs  Wheelchair Mobility    Modified Rankin (Stroke Patients Only)       Balance Overall balance assessment: Needs assistance Sitting-balance support: No upper extremity supported, Feet  supported Sitting balance-Leahy Scale: Good     Standing balance support: Bilateral upper extremity supported Standing balance-Leahy Scale: Fair                               Pertinent Vitals/Pain Pain Assessment Pain Assessment: 0-10 Pain Score: 10-Worst pain ever Pain Location: R hip Pain Descriptors / Indicators: Tender, Sharp Pain Intervention(s): Limited activity within patient's tolerance, Monitored during session, Repositioned, Premedicated before session    Home Living Family/patient expects to be discharged to:: Private residence Living Arrangements: Alone Available Help at Discharge: Friend(s);Neighbor;Available PRN/intermittently Type of Home: House         Home Layout: One level Home Equipment: None      Prior Function Prior Level of Function : Independent/Modified Independent;Driving             Mobility Comments: Indep with ADLs, household and community mobilization without assist device; denies recent fall history. ADLs Comments: Pt reports being very active and Ind at baseline. 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     Hand Dominance   Dominant Hand: Right    Extremity/Trunk Assessment   Upper Extremity Assessment Upper Extremity Assessment: Overall WFL for tasks assessed    Lower Extremity Assessment Lower Extremity Assessment:  (R LE grossly 3/5 throughout, requiring act assist for isolated against-gravity movement of R hip (due to pain); supports partial body weight without buckling (though heavy WBing bilat UEs).  Denies numbness, tingling or radicular symptoms.  L LE WFL)       Communication   Communication: No difficulties  Cognition Arousal/Alertness: Awake/alert Behavior During Therapy: WFL for tasks assessed/performed Overall Cognitive Status: Within Functional Limits for tasks assessed                                          General Comments      Exercises Other Exercises Other  Exercises: Toilet transfer, ambulatory with RW, cga/close sup; sit/stand from standard toilet height with RW, graab bar, min assist; standing balance for peri-care, clothing management and hand hygiene, cga/min assist.  Decreased active use/WBing R LE throughout, but good compensatory strategies noted throughout   Assessment/Plan    PT Assessment Patient needs continued PT services  PT Problem List Decreased strength;Decreased activity tolerance;Decreased balance;Decreased mobility;Decreased range of motion;Decreased knowledge of use of DME;Decreased safety awareness;Decreased knowledge of precautions;Pain       PT Treatment Interventions DME instruction;Gait training;Stair training;Cognitive remediation;Therapeutic activities;Functional mobility training;Therapeutic exercise;Balance training;Patient/family education    PT Goals (Current goals can be found in the Care Plan section)  Acute Rehab PT Goals Patient Stated Goal: to return home PT Goal Formulation: With patient Time For Goal Achievement: 06/07/22 Potential to Achieve Goals: Good    Frequency Min 2X/week     Co-evaluation               AM-PAC PT "6 Clicks" Mobility  Outcome Measure Help needed turning from your back to your side while in a flat bed without using bedrails?: None Help needed moving from lying on your back to sitting on the side of a flat bed without using bedrails?: None Help needed moving to and from  a bed to a chair (including a wheelchair)?: A Little Help needed standing up from a chair using your arms (e.g., wheelchair or bedside chair)?: A Little Help needed to walk in hospital room?: A Little Help needed climbing 3-5 steps with a railing? : A Little 6 Click Score: 20    End of Session Equipment Utilized During Treatment: Gait belt Activity Tolerance: Patient tolerated treatment well;Patient limited by pain Patient left: in chair;with call bell/phone within reach;with chair alarm set Nurse  Communication: Mobility status PT Visit Diagnosis: Difficulty in walking, not elsewhere classified (R26.2);Muscle weakness (generalized) (M62.81);Pain Pain - Right/Left: Right Pain - part of body: Hip    Time: 3545-6256 PT Time Calculation (min) (ACUTE ONLY): 31 min   Charges:   PT Evaluation $PT Eval Moderate Complexity: 1 Mod PT Treatments $Therapeutic Activity: 8-22 mins       Azuri Bozard H. Owens Shark, PT, DPT, NCS 05/24/22, 1:30 PM [phone]

## 2022-05-25 NOTE — TOC Transition Note (Signed)
Transition of Care [facility]) - CM/SW Discharge Note   Patient Details  Name: Kim Ballard MRN: [id number] Date of Birth: November 23, 1934  Transition of [facility]) CM/SW Contact:  Quin Hoop, LCSW Phone Number: 05/25/2022, 8:20 AM   Clinical Narrative:    Patient discharged home with home health. Patient transported by EMS.  CMS explained to patient that, because EMS transport wasn't recommended that there may be a charge for her transport back home via EMS.  Patient stated that she understood.  Transport to be set up by nurse.  Patient was waiting on a RW prior to being transported.   Final next level of care: [place] Barriers to Discharge: No Barriers Identified   Patient Goals and CMS Choice Patient states their goals for this hospitalization and ongoing recovery are:: patient wasn't sure about returning home due to lack of support.      Discharge Placement                  Name of family member notified: CSW tried reaching contacts listed in chart and was unable to speak with them.  lvm    Discharge Plan and Services                  DME Agency: [place] Arranged: PT, OT [facility] Agency: [place] Date [place]: 05/24/22   Representative spoke with at [place]: [place]) Interventions Housing Interventions: Intervention Not Indicated   Readmission Risk Interventions     No data to display

## 2022-05-28 LAB — CULTURE, BLOOD (ROUTINE X 2)
Culture: NO GROWTH
Culture: NO GROWTH
Special Requests: ADEQUATE
Special Requests: ADEQUATE

## 2022-06-26 ENCOUNTER — Encounter: Payer: Self-pay | Admitting: Internal Medicine

## 2022-07-19 ENCOUNTER — Encounter: Payer: Self-pay | Admitting: Internal Medicine

## 2022-07-25 MED FILL — Iron Sucrose Inj 20 MG/ML (Fe Equiv): INTRAVENOUS | Qty: 10 | Status: AC

## 2022-07-26 ENCOUNTER — Inpatient Hospital Stay: Payer: Medicare HMO

## 2022-07-26 ENCOUNTER — Inpatient Hospital Stay: Payer: Medicare HMO | Attending: Internal Medicine

## 2022-07-26 ENCOUNTER — Encounter: Payer: Self-pay | Admitting: Internal Medicine

## 2022-07-26 ENCOUNTER — Inpatient Hospital Stay (HOSPITAL_BASED_OUTPATIENT_CLINIC_OR_DEPARTMENT_OTHER): Payer: Medicare HMO | Admitting: Internal Medicine

## 2022-07-26 VITALS — BP 180/80 | HR 73 | Resp 18 | Ht 67.0 in | Wt 149.0 lb

## 2022-07-26 DIAGNOSIS — D509 Iron deficiency anemia, unspecified: Secondary | ICD-10-CM | POA: Insufficient documentation

## 2022-07-26 DIAGNOSIS — F32A Depression, unspecified: Secondary | ICD-10-CM | POA: Insufficient documentation

## 2022-07-26 DIAGNOSIS — I10 Essential (primary) hypertension: Secondary | ICD-10-CM | POA: Insufficient documentation

## 2022-07-26 DIAGNOSIS — Z79899 Other long term (current) drug therapy: Secondary | ICD-10-CM | POA: Diagnosis not present

## 2022-07-26 DIAGNOSIS — D696 Thrombocytopenia, unspecified: Secondary | ICD-10-CM | POA: Diagnosis not present

## 2022-07-26 LAB — BASIC METABOLIC PANEL
Anion gap: 11 (ref 5–15)
BUN: 32 mg/dL — ABNORMAL HIGH (ref 8–23)
CO2: 23 mmol/L (ref 22–32)
Calcium: 9.2 mg/dL (ref 8.9–10.3)
Chloride: 106 mmol/L (ref 98–111)
Creatinine, Ser: 0.95 mg/dL (ref 0.44–1.00)
GFR, Estimated: 59 mL/min — ABNORMAL LOW (ref >60)
Glucose, Bld: 212 mg/dL — ABNORMAL HIGH (ref 70–99)
Potassium: 4.2 mmol/L (ref 3.5–5.1)
Sodium: 140 mmol/L (ref 135–145)

## 2022-07-26 LAB — IRON AND TIBC
Iron: 79 ug/dL (ref 28–170)
Saturation Ratios: 20 % (ref 10.4–31.8)
TIBC: 398 ug/dL (ref 250–450)
UIBC: 319 ug/dL

## 2022-07-26 LAB — CBC WITH DIFFERENTIAL/PLATELET
Abs Immature Granulocytes: 0.02 10*3/uL (ref 0.00–0.07)
Basophils Absolute: 0 10*3/uL (ref 0.0–0.1)
Basophils Relative: 1 %
Eosinophils Absolute: 0.1 10*3/uL (ref 0.0–0.5)
Eosinophils Relative: 3 %
HCT: 30.7 % — ABNORMAL LOW (ref 36.0–46.0)
Hemoglobin: 10 g/dL — ABNORMAL LOW (ref 12.0–15.0)
Immature Granulocytes: 0 %
Lymphocytes Relative: 18 %
Lymphs Abs: 0.9 10*3/uL (ref 0.7–4.0)
MCH: 31.6 pg (ref 26.0–34.0)
MCHC: 32.6 g/dL (ref 30.0–36.0)
MCV: 97.2 fL (ref 80.0–100.0)
Monocytes Absolute: 0.4 10*3/uL (ref 0.1–1.0)
Monocytes Relative: 8 %
Neutro Abs: 3.3 10*3/uL (ref 1.7–7.7)
Neutrophils Relative %: 70 %
Platelets: 141 10*3/uL — ABNORMAL LOW (ref 150–400)
RBC: 3.16 MIL/uL — ABNORMAL LOW (ref 3.87–5.11)
RDW: 13.2 % (ref 11.5–15.5)
WBC: 4.7 10*3/uL (ref 4.0–10.5)
nRBC: 0 % (ref 0.0–0.2)

## 2022-07-26 LAB — FERRITIN: Ferritin: 160 ng/mL (ref 11–307)

## 2022-07-26 NOTE — Assessment & Plan Note (Addendum)
#  Chronic mild anemia-hemoglobin  10-11; OCT 2023--Iron sat-18%; Ferritin- 135.  Proceed with Venofer today.STABLE.  # ITP/ Thrombocytopenia-platelet count-130-150-STABLE. on surveillance.  # Elevated HTN- keep a log BPs; Repeat 170/90.  And defer to PCP, Dr.Hande.   # Fatigue- ? Insomnia- recommend melatonin 53m/day. STABLE.  # DISPOSITION: #  HOLD venofer [pt pref] # Venofer- this week/thurs; and again venofer weekly x1  # follow up in 4 months MD: labs- cbc/bmp/iron studies/ferritin--possible Venofer-Dr.B

## 2022-07-26 NOTE — Progress Notes (Signed)
[place] CONSULT NOTE  Patient Care Team: Tracie Harrier, MD as PCP - General (Internal Medicine) Tracie Harrier, MD as Physician Assistant (Internal Medicine) Lequita Asal, MD (Inactive) as Consulting Physician (Hematology and Oncology) Cammie Sickle, MD as Consulting Physician (Oncology)  CHIEF COMPLAINTS/PURPOSE OF CONSULTATION: Iron deficiency anemia/thrombocytopenia  #Chronic [2015] mild anemia hemoglobin 11.1-question iron deficiency versus others.-Dr. Elliott/Dr. Mike Gip; s/p Venofer.   #Mild thrombocytopenia platelets 120s  #Fatigue/ PN/DM; MELANOMA of mid back [May 2021] s/p excision. MELANOMA LEFT neck posterior-s/p excision.[place] 2023 ]  Oncology History   No history exists.    HISTORY OF PRESENTING ILLNESS: Alone.  Ambulating independently.  Kim Ballard 88 y.o.  female longstanding history of iron deficient anemia/thrombocytopenia is here for follow-up.  Complains of ongoing fatigue.  No fever no chills.  No nausea or vomiting.  No blood in stools or black or stools.  Review of Systems  Constitutional:  Positive for malaise/fatigue. Negative for chills, diaphoresis, fever and weight loss.  HENT:  Negative for nosebleeds and sore throat.   Eyes:  Negative for double vision.  Respiratory:  Negative for cough, hemoptysis, sputum production, shortness of breath and wheezing.   Cardiovascular:  Negative for chest pain, palpitations, orthopnea and leg swelling.  Gastrointestinal:  Negative for blood in stool, constipation, diarrhea, heartburn, melena, nausea and vomiting.  Musculoskeletal:  Negative for back pain and joint pain.  Skin: Negative.  Negative for itching and rash.  Neurological:  Positive for tingling. Negative for dizziness, focal weakness, weakness and headaches.  Endo/Heme/Allergies:  Does not bruise/bleed easily.  Psychiatric/Behavioral:  Negative for depression. The patient is not nervous/anxious and does not have  insomnia.      MEDICAL HISTORY:  Past Medical History:  Diagnosis Date   Allergic state    Anemia    Arthritis    Cancer ([place])    skin cancers   Chronic cystitis with hematuria    Depression    Diabetes mellitus without complication ([place])    Fibrocystic disease of both breasts    Fibromyalgia    GERD (gastroesophageal reflux disease)    Hypertension    IBS (irritable bowel syndrome)    Melanoma ([place]) 07/09/2021   lt neck/shoulder area   Melanoma of neck ([place]) 09/06/2021   left   Nausea    Skin cancer    Wears dentures    full upper and lower    SURGICAL HISTORY: Past Surgical History:  Procedure Laterality Date   ABDOMINAL HYSTERECTOMY     APPENDECTOMY     BREAST BIOPSY Right 2001   surgical bx    CATARACT EXTRACTION W/PHACO Right 03/08/2017   Procedure: CATARACT EXTRACTION PHACO AND INTRAOCULAR LENS PLACEMENT ([place]) RIGHT DIABETIC TORIC;  Surgeon: Leandrew Koyanagi, MD;  Location: [place];  Service: Ophthalmology;  Laterality: Right;  Diabetic - oral meds   CATARACT EXTRACTION W/PHACO Left 04/17/2017   Procedure: CATARACT EXTRACTION PHACO AND INTRAOCULAR LENS PLACEMENT ([place]) LEFT DIABETIC;  Surgeon: Leandrew Koyanagi, MD;  Location: [place];  Service: Ophthalmology;  Laterality: Left;  diabetic-oral meds   CHOLECYSTECTOMY     COLONOSCOPY     COLONOSCOPY WITH PROPOFOL N/A 08/27/2015   Procedure: COLONOSCOPY WITH PROPOFOL;  Surgeon: Manya Silvas, MD;  Location: [facility] ENDOSCOPY;  Service: Endoscopy;  Laterality: N/A;   COLONOSCOPY WITH PROPOFOL N/A 05/02/2016   Procedure: COLONOSCOPY WITH PROPOFOL;  Surgeon: Manya Silvas, MD;  Location: [facility] ENDOSCOPY;  Service: Endoscopy;  Laterality: N/A;   ESOPHAGOGASTRODUODENOSCOPY  ESOPHAGOGASTRODUODENOSCOPY (EGD) WITH PROPOFOL N/A 08/27/2015   Procedure: ESOPHAGOGASTRODUODENOSCOPY (EGD) WITH PROPOFOL;  Surgeon: Manya Silvas, MD;  Location: [facility] ENDOSCOPY;  Service: Endoscopy;  Laterality:  N/A;   HERNIA REPAIR      SOCIAL HISTORY: Social History   Socioeconomic History   Marital status: Widowed    Spouse name: Not on file   Number of children: Not on file   Years of education: Not on file   Highest education level: Not on file  Occupational History   Not on file  Tobacco Use   Smoking status: Former    Types: Cigarettes    Quit date: 44    Years since quitting: 64.1   Smokeless tobacco: Never  Vaping Use   Vaping Use: Never used  Substance and Sexual Activity   Alcohol use: No   Drug use: No   Sexual activity: Not Currently  Other Topics Concern   Not on file  Social History Narrative   Not on file   Social Determinants of Health   Financial Resource Strain: Not on file  Food Insecurity: No Food Insecurity (05/24/2022)   Hunger Vital Sign    Worried About Running Out of Food in the Last Year: Never true    Ran Out of Food in the Last Year: Never true  Transportation Needs: No Transportation Needs (05/24/2022)   PRAPARE - Hydrologist (Medical): No    Lack of Transportation (Non-Medical): No  Physical Activity: Not on file  Stress: Not on file  Social Connections: Not on file  Intimate Partner Violence: Not At Risk (05/24/2022)   Humiliation, Afraid, Rape, and Kick questionnaire    Fear of Current or Ex-Partner: No    Emotionally Abused: No    Physically Abused: No    Sexually Abused: No    FAMILY HISTORY: Family History  Problem Relation Age of Onset   Hypertension Mother    Ovarian cancer Paternal Grandmother    COPD Father    Hyperlipidemia Father    Kidney disease Father    Diabetes Maternal Grandmother    Breast cancer Neg Hx     ALLERGIES:  is allergic to biaxin [clarithromycin], brompheniramine maleate, ceclor [cefaclor], ciprofloxacin, clinoril [sulindac], codeine sulfate, diovan [valsartan], doxycycline, erythromycin, esomeprazole, fluoxetine, guaifenesin & derivatives, levaquin [levofloxacin in  d5w], lisinopril, lodine [etodolac], macrodantin [nitrofurantoin macrocrystal], medrol [methylprednisolone], mobic [meloxicam], motrin [ibuprofen], nexium [esomeprazole magnesium], nitrofurantoin, norvasc [amlodipine besylate], omnicef [cefdinir], penicillins, prozac [fluoxetine hcl], pseudoephedrine, reglan [metoclopramide], seldane [terfenadine], sulfa antibiotics, toprol xl [metoprolol tartrate], triamterene, tussionex pennkinetic er [hydrocod poli-chlorphe poli er], vibramycin [doxycycline calcium], and amlodipine.  MEDICATIONS:  Current Outpatient Medications  Medication Sig Dispense Refill   Ascorbic Acid (VITAMIN C) 1000 MG tablet Take 1,000 mg by mouth 2 (two) times daily.     azithromycin (ZITHROMAX) 250 MG tablet Take 2 tablets (526m) by mouth on Day 1. Take 1 tablet (2536m by mouth on Days 2-5.     cholecalciferol (VITAMIN D3) 25 MCG (1000 UT) tablet Take 1,000 Units by mouth 2 (two) times daily.     cyanocobalamin (VITAMIN B12) 1000 MCG tablet Take 1,000 mcg by mouth daily.     fluticasone (FLONASE) 50 MCG/ACT nasal spray Place 2 sprays into both nostrils daily as needed for allergies.     gemfibrozil (LOPID) 600 MG tablet Take 600 mg by mouth 2 (two) times daily before a meal.     glipiZIDE (GLUCOTROL XL) 5 MG 24 hr tablet Take 10 mg by  mouth in the morning and at bedtime.     hydrochlorothiazide (HYDRODIURIL) 25 MG tablet Take 25 mg by mouth daily.     iron polysaccharides (NIFEREX) 150 MG capsule Take 150 mg by mouth daily.     metFORMIN (GLUCOPHAGE-XR) 500 MG 24 hr tablet Take 1,000 mg by mouth 2 (two) times daily.     methocarbamol (ROBAXIN) 500 MG tablet Take 1 tablet (500 mg total) by mouth every 8 (eight) hours as needed for muscle spasms. 30 tablet 0   MIRABEGRON ER PO Take 1 tablet by mouth daily.     Multiple Vitamins-Minerals (MULTIVITAMIN WITH MINERALS) tablet Take 1 tablet by mouth daily.     Omega 3 1000 MG CAPS Take 1,000 mg by mouth daily.     pantoprazole (PROTONIX)  40 MG tablet Take 40 mg by mouth daily.     vitamin E 180 MG (400 UNITS) capsule Take 400 Units by mouth daily.     No current facility-administered medications for this visit.      [place] Kitchen  PHYSICAL EXAMINATION: ECOG PERFORMANCE STATUS: 0 - Asymptomatic  Vitals:   07/26/22 1321 07/26/22 1326  BP:  (!) 180/80  Pulse:  73  Resp: 18 18  SpO2:  99%   Filed Weights   07/26/22 1321  Weight: 149 lb (67.6 kg)    Physical Exam Constitutional:      Comments: Patient is alone.  Ambulating dependently.  HENT:     Head: Normocephalic and atraumatic.     Mouth/Throat:     Pharynx: No oropharyngeal exudate.  Eyes:     Pupils: Pupils are equal, round, and reactive to light.  Cardiovascular:     Rate and Rhythm: Normal rate and regular rhythm.  Pulmonary:     Effort: Pulmonary effort is normal. No respiratory distress.     Breath sounds: Normal breath sounds. No wheezing.  Abdominal:     General: Bowel sounds are normal. There is no distension.     Palpations: Abdomen is soft. There is no mass.     Tenderness: There is no abdominal tenderness. There is no guarding or rebound.  Musculoskeletal:        General: No tenderness. Normal range of motion.     Cervical back: Normal range of motion and neck supple.  Skin:    General: Skin is warm.  Neurological:     Mental Status: She is alert and oriented to person, place, and time.  Psychiatric:        Mood and Affect: Affect normal.    LABORATORY DATA:  I have reviewed the data as listed Lab Results  Component Value Date   WBC 4.7 07/26/2022   HGB 10.0 (L) 07/26/2022   HCT 30.7 (L) 07/26/2022   MCV 97.2 07/26/2022   PLT 141 (L) 07/26/2022   Recent Labs    05/23/22 1242 05/24/22 0519 07/26/22 1257  NA 138 137 140  K 3.4* 3.7 4.2  CL 101 105 106  CO2 25 26 23  $ GLUCOSE 180* 210* 212*  BUN 29* 24* 32*  CREATININE 0.97 0.93 0.95  CALCIUM 10.2 9.4 9.2  GFRNONAA 57* >60 59*  PROT 7.6  --   --   ALBUMIN 4.0  --   --   AST  10*  --   --   ALT 11  --   --   ALKPHOS 90  --   --   BILITOT 0.8  --   --     RADIOGRAPHIC STUDIES:  I have personally reviewed the radiological images as listed and agreed with the findings in the report. No results found.  ASSESSMENT & PLAN:   Thrombocytopenia ([place]) #Chronic mild anemia-hemoglobin  10-11; OCT 2023--Iron sat-18%; Ferritin- 135.  Proceed with Venofer today.STABLE.  # ITP/ Thrombocytopenia-platelet count-130-150-STABLE. on surveillance.  # Elevated HTN- keep a log BPs; Repeat 170/90.  And defer to PCP, Dr.Hande.   # Fatigue- ? Insomnia- recommend melatonin 41m/day. STABLE.  # DISPOSITION: #  HOLD venofer [pt pref] # Venofer- this week/thurs; and again venofer weekly x1  # follow up in 4 months MD: labs- cbc/bmp/iron studies/ferritin--possible Venofer-Dr.B    GCammie Sickle MD 07/26/2022 2:14 PM

## 2022-07-28 ENCOUNTER — Telehealth: Payer: Self-pay | Admitting: *Deleted

## 2022-07-28 ENCOUNTER — Inpatient Hospital Stay: Payer: Medicare HMO

## 2022-07-28 VITALS — BP 175/71 | HR 72 | Temp 98.0°F | Resp 18

## 2022-07-28 DIAGNOSIS — D508 Other iron deficiency anemias: Secondary | ICD-10-CM

## 2022-07-28 DIAGNOSIS — D509 Iron deficiency anemia, unspecified: Secondary | ICD-10-CM | POA: Diagnosis not present

## 2022-07-28 MED ORDER — SODIUM CHLORIDE 0.9 % IV SOLN
INTRAVENOUS | Status: DC | PRN
  Filled 2022-07-28: qty 250

## 2022-07-28 MED ORDER — SODIUM CHLORIDE 0.9 % IV SOLN
200.0000 mg | Freq: Once | INTRAVENOUS | Status: AC
  Administered 2022-07-28: 200 mg via INTRAVENOUS
  Filled 2022-07-28: qty 200

## 2022-07-28 NOTE — Telephone Encounter (Signed)
Patient called stating that she has 2 more pills in her Zpak to take and is asking if it is ok to get her iron infusion today being on antibiotics. Please advise

## 2022-07-28 NOTE — Telephone Encounter (Signed)
Call returned to patient to inform her that per Dr B ok to proceed with Iron infusion, I got her voice mail and left message for her that it is ok to get her iron today

## 2022-08-03 MED FILL — Iron Sucrose Inj 20 MG/ML (Fe Equiv): INTRAVENOUS | Qty: 10 | Status: AC

## 2022-08-04 ENCOUNTER — Inpatient Hospital Stay: Payer: Medicare HMO

## 2022-08-12 ENCOUNTER — Inpatient Hospital Stay: Payer: Medicare HMO | Attending: Internal Medicine

## 2022-08-12 VITALS — BP 174/79 | HR 73 | Temp 97.0°F | Resp 18

## 2022-08-12 DIAGNOSIS — D509 Iron deficiency anemia, unspecified: Secondary | ICD-10-CM | POA: Diagnosis present

## 2022-08-12 DIAGNOSIS — D508 Other iron deficiency anemias: Secondary | ICD-10-CM

## 2022-08-12 MED ORDER — SODIUM CHLORIDE 0.9 % IV SOLN
INTRAVENOUS | Status: DC
  Filled 2022-08-12: qty 250

## 2022-08-12 MED ORDER — SODIUM CHLORIDE 0.9 % IV SOLN
200.0000 mg | Freq: Once | INTRAVENOUS | Status: AC
  Administered 2022-08-12: 200 mg via INTRAVENOUS
  Filled 2022-08-12: qty 200

## 2022-08-24 ENCOUNTER — Inpatient Hospital Stay: Payer: Medicare HMO

## 2022-08-24 ENCOUNTER — Inpatient Hospital Stay: Payer: Medicare HMO | Admitting: Internal Medicine

## 2022-09-01 ENCOUNTER — Other Ambulatory Visit: Payer: Self-pay | Admitting: Unknown Physician Specialty

## 2022-09-01 DIAGNOSIS — M47812 Spondylosis without myelopathy or radiculopathy, cervical region: Secondary | ICD-10-CM

## 2022-09-05 ENCOUNTER — Other Ambulatory Visit: Payer: Self-pay | Admitting: Internal Medicine

## 2022-09-05 DIAGNOSIS — Z1231 Encounter for screening mammogram for malignant neoplasm of breast: Secondary | ICD-10-CM

## 2022-09-16 ENCOUNTER — Ambulatory Visit: Payer: Medicare HMO | Admitting: Internal Medicine

## 2022-09-16 ENCOUNTER — Other Ambulatory Visit: Payer: Medicare HMO

## 2022-09-16 ENCOUNTER — Ambulatory Visit
Admission: RE | Admit: 2022-09-16 | Discharge: 2022-09-16 | Disposition: A | Discharge status: Home / Self Care | Payer: Medicare HMO | Source: Ambulatory Visit | Attending: Unknown Physician Specialty | Admitting: Unknown Physician Specialty

## 2022-09-16 ENCOUNTER — Ambulatory Visit: Admission: RE | Admit: 2022-09-16 | Payer: Medicare HMO | Source: Ambulatory Visit

## 2022-09-16 ENCOUNTER — Ambulatory Visit: Payer: Medicare HMO

## 2022-09-16 DIAGNOSIS — M47812 Spondylosis without myelopathy or radiculopathy, cervical region: Secondary | ICD-10-CM

## 2022-09-22 ENCOUNTER — Ambulatory Visit
Admission: RE | Admit: 2022-09-22 | Discharge: 2022-09-22 | Disposition: A | Discharge status: Home / Self Care | Payer: Medicare HMO | Source: Ambulatory Visit | Attending: Internal Medicine | Admitting: Internal Medicine

## 2022-09-22 DIAGNOSIS — Z1231 Encounter for screening mammogram for malignant neoplasm of breast: Secondary | ICD-10-CM | POA: Diagnosis present

## 2022-09-22 MED FILL — Iron Sucrose Inj 20 MG/ML (Fe Equiv): INTRAVENOUS | Qty: 10 | Status: AC

## 2022-09-23 ENCOUNTER — Inpatient Hospital Stay (HOSPITAL_BASED_OUTPATIENT_CLINIC_OR_DEPARTMENT_OTHER): Payer: Medicare HMO | Admitting: Internal Medicine

## 2022-09-23 ENCOUNTER — Inpatient Hospital Stay: Payer: Medicare HMO

## 2022-09-23 ENCOUNTER — Encounter: Payer: Self-pay | Admitting: Internal Medicine

## 2022-09-23 ENCOUNTER — Inpatient Hospital Stay: Payer: Medicare HMO | Attending: Internal Medicine

## 2022-09-23 VITALS — BP 188/81 | HR 76 | Temp 98.6°F | Resp 20 | Wt 146.0 lb

## 2022-09-23 DIAGNOSIS — D509 Iron deficiency anemia, unspecified: Secondary | ICD-10-CM | POA: Insufficient documentation

## 2022-09-23 DIAGNOSIS — D696 Thrombocytopenia, unspecified: Secondary | ICD-10-CM | POA: Diagnosis not present

## 2022-09-23 DIAGNOSIS — Z79899 Other long term (current) drug therapy: Secondary | ICD-10-CM | POA: Insufficient documentation

## 2022-09-23 DIAGNOSIS — Z8041 Family history of malignant neoplasm of ovary: Secondary | ICD-10-CM | POA: Insufficient documentation

## 2022-09-23 DIAGNOSIS — Z9181 History of falling: Secondary | ICD-10-CM | POA: Diagnosis not present

## 2022-09-23 DIAGNOSIS — Z87891 Personal history of nicotine dependence: Secondary | ICD-10-CM | POA: Insufficient documentation

## 2022-09-23 DIAGNOSIS — D649 Anemia, unspecified: Secondary | ICD-10-CM | POA: Diagnosis not present

## 2022-09-23 DIAGNOSIS — R5383 Other fatigue: Secondary | ICD-10-CM | POA: Diagnosis not present

## 2022-09-23 LAB — BASIC METABOLIC PANEL - CANCER CENTER ONLY
Anion gap: 11 (ref 5–15)
BUN: 36 mg/dL — ABNORMAL HIGH (ref 8–23)
CO2: 23 mmol/L (ref 22–32)
Calcium: 9.3 mg/dL (ref 8.9–10.3)
Chloride: 106 mmol/L (ref 98–111)
Creatinine: 1.19 mg/dL — ABNORMAL HIGH (ref 0.44–1.00)
GFR, Estimated: 45 mL/min — ABNORMAL LOW (ref >60)
Glucose, Bld: 302 mg/dL — ABNORMAL HIGH (ref 70–99)
Potassium: 4.4 mmol/L (ref 3.5–5.1)
Sodium: 140 mmol/L (ref 135–145)

## 2022-09-23 LAB — CBC WITH DIFFERENTIAL (CANCER CENTER ONLY)
Abs Immature Granulocytes: 0.01 10*3/uL (ref 0.00–0.07)
Basophils Absolute: 0 10*3/uL (ref 0.0–0.1)
Basophils Relative: 1 %
Eosinophils Absolute: 0.1 10*3/uL (ref 0.0–0.5)
Eosinophils Relative: 3 %
HCT: 30.1 % — ABNORMAL LOW (ref 36.0–46.0)
Hemoglobin: 9.8 g/dL — ABNORMAL LOW (ref 12.0–15.0)
Immature Granulocytes: 0 %
Lymphocytes Relative: 24 %
Lymphs Abs: 0.9 10*3/uL (ref 0.7–4.0)
MCH: 31.3 pg (ref 26.0–34.0)
MCHC: 32.6 g/dL (ref 30.0–36.0)
MCV: 96.2 fL (ref 80.0–100.0)
Monocytes Absolute: 0.3 10*3/uL (ref 0.1–1.0)
Monocytes Relative: 8 %
Neutro Abs: 2.3 10*3/uL (ref 1.7–7.7)
Neutrophils Relative %: 64 %
Platelet Count: 129 10*3/uL — ABNORMAL LOW (ref 150–400)
RBC: 3.13 MIL/uL — ABNORMAL LOW (ref 3.87–5.11)
RDW: 13.1 % (ref 11.5–15.5)
WBC Count: 3.6 10*3/uL — ABNORMAL LOW (ref 4.0–10.5)
nRBC: 0 % (ref 0.0–0.2)

## 2022-09-23 LAB — IRON AND TIBC
Iron: 60 ug/dL (ref 28–170)
Saturation Ratios: 15 % (ref 10.4–31.8)
TIBC: 407 ug/dL (ref 250–450)
UIBC: 347 ug/dL

## 2022-09-23 LAB — FERRITIN: Ferritin: 169 ng/mL (ref 11–307)

## 2022-09-23 NOTE — Assessment & Plan Note (Addendum)
#  Chronic mild anemia-hemoglobin  10-11; FEB 2024-iron saturation 20%; ferritin elevated.  Not totally consistent with iron deficiency.   # Today hemoglobin is 9.8 post IV iron infusion-I discussed with the patient importance to consider alternative causes of her ongoing anemia-discussed regarding possibility of MDS/malignancies given her age.  I did recommend a bone marrow biopsy for further evaluation.  Patient wants to wait until her colonoscopy early May.  # ITP/ Thrombocytopenia-platelet count-130-150-STABLE. on surveillance.  Discussed with the patient the bone marrow biopsy and aspiration indication and procedure at length.  Given significant discomfort involved-I would recommend under anesthesia/with radiology in the hospital. I discussed the potential complications include-bleeding/trauma and risk of infection; which are fortunately very rare.  Patient is in agreement. Patient will sign the consent prior to the procedure. Bone marrow biopsy/aspiration is ordered.   # Elevated HTN- keep a log BPs; Repeat 170/90.  And defer to PCP, Dr.Hande.  stable.   # Fatigue-await above bone marrow biopsy.  # DISPOSITION: #  HOLD venofer # bone marrow bx in 1 month # follow up in 6 weeks- MD: labs- cbc/bmp/iron studies/ferritin; LDH---possible Venofer-Dr.B

## 2022-09-23 NOTE — Progress Notes (Signed)
Patient fell last Thursday and hit her left hip. Patient states she is feeling weak. Patient states she fatigued all the time.

## 2022-09-23 NOTE — Progress Notes (Signed)
[facility] CONSULT NOTE  Patient Care Team: Barbette Reichmann, MD as PCP - General (Internal Medicine) Barbette Reichmann, MD as Physician Assistant (Internal Medicine) Rosey Bath, MD (Inactive) as Consulting Physician (Hematology and Oncology) Earna Coder, MD as Consulting Physician (Oncology)  CHIEF COMPLAINTS/PURPOSE OF CONSULTATION: Iron deficiency anemia/thrombocytopenia  #Chronic [2015] mild anemia hemoglobin 11.1-question iron deficiency versus others.-Dr. Elliott/Dr. Merlene Pulling; s/p Venofer.   #Mild thrombocytopenia platelets 120s  #Fatigue/ PN/DM; MELANOMA of mid back [May 2021] s/p excision. MELANOMA LEFT neck posterior-s/p excision.[JAN 2023 ]  Oncology History   No history exists.    HISTORY OF PRESENTING ILLNESS: Alone.  Ambulating independently.  Kim Ballard 88 y.o.  female longstanding history of iron deficient anemia/thrombocytopenia is here for follow-up.  Patient fell last Thursday and hit her left hip. Patient states she is feeling weak. Patient states she fatigued all the time   No fever no chills.  No nausea or vomiting.  No blood in stools or black or stools.  Review of Systems  Constitutional:  Positive for malaise/fatigue. Negative for chills, diaphoresis, fever and weight loss.  HENT:  Negative for nosebleeds and sore throat.   Eyes:  Negative for double vision.  Respiratory:  Negative for cough, hemoptysis, sputum production, shortness of breath and wheezing.   Cardiovascular:  Negative for chest pain, palpitations, orthopnea and leg swelling.  Gastrointestinal:  Negative for blood in stool, constipation, diarrhea, heartburn, melena, nausea and vomiting.  Musculoskeletal:  Negative for back pain and joint pain.  Skin: Negative.  Negative for itching and rash.  Neurological:  Positive for tingling. Negative for dizziness, focal weakness, weakness and headaches.  Endo/Heme/Allergies:  Does not bruise/bleed easily.   Psychiatric/Behavioral:  Negative for depression. The patient is not nervous/anxious and does not have insomnia.      MEDICAL HISTORY:  Past Medical History:  Diagnosis Date   Allergic state    Anemia    Arthritis    Cancer    skin cancers   Chronic cystitis with hematuria    Depression    Diabetes mellitus without complication    Fibrocystic disease of both breasts    Fibromyalgia    GERD (gastroesophageal reflux disease)    Hypertension    IBS (irritable bowel syndrome)    Melanoma 07/09/2021   lt neck/shoulder area   Melanoma of neck 09/06/2021   left   Nausea    Skin cancer    Wears dentures    full upper and lower    SURGICAL HISTORY: Past Surgical History:  Procedure Laterality Date   ABDOMINAL HYSTERECTOMY     APPENDECTOMY     BREAST BIOPSY Right 2001   surgical bx    CATARACT EXTRACTION W/PHACO Right 03/08/2017   Procedure: CATARACT EXTRACTION PHACO AND INTRAOCULAR LENS PLACEMENT (IOC) RIGHT DIABETIC TORIC;  Surgeon: Lockie Mola, MD;  Location: [facility];  Service: Ophthalmology;  Laterality: Right;  Diabetic - oral meds   CATARACT EXTRACTION W/PHACO Left 04/17/2017   Procedure: CATARACT EXTRACTION PHACO AND INTRAOCULAR LENS PLACEMENT (IOC) LEFT DIABETIC;  Surgeon: Lockie Mola, MD;  Location: [facility];  Service: Ophthalmology;  Laterality: Left;  diabetic-oral meds   CHOLECYSTECTOMY     COLONOSCOPY     COLONOSCOPY WITH PROPOFOL N/A 08/27/2015   Procedure: COLONOSCOPY WITH PROPOFOL;  Surgeon: Scot Jun, MD;  Location: [facility] END[facility]COPY;  Service: Endoscopy;  Laterality: N/A;   COLONOSCOPY WITH PROPOFOL N/A 05/02/2016   Procedure: COLONOSCOPY WITH PROPOFOL;  Surgeon: Scot Jun,  MD;  Location: [facility] ENDOSCOPY;  Service: Endoscopy;  Laterality: N/A;   ESOPHAGOGASTRODUODENOSCOPY     ESOPHAGOGASTRODUODENOSCOPY (EGD) WITH PROPOFOL N/A 08/27/2015   Procedure: ESOPHAGOGASTRODUODENOSCOPY (EGD) WITH PROPOFOL;  Surgeon:  Scot Jun, MD;  Location: [facility] ENDOSCOPY;  Service: Endoscopy;  Laterality: N/A;   HERNIA REPAIR      SOCIAL HISTORY: Social History   Socioeconomic History   Marital status: Widowed    Spouse name: Not on file   Number of children: Not on file   Years of education: Not on file   Highest education level: Not on file  Occupational History   Not on file  Tobacco Use   Smoking status: Former    Types: Cigarettes    Quit date: 10    Years since quitting: 64.3   Smokeless tobacco: Never  Vaping Use   Vaping Use: Never used  Substance and Sexual Activity   Alcohol use: No   Drug use: No   Sexual activity: Not Currently  Other Topics Concern   Not on file  Social History Narrative   Not on file   Social Determinants of Health   Financial Resource Strain: Not on file  Food Insecurity: No Food Insecurity (05/24/2022)   Hunger Vital Sign    Worried About Running Out of Food in the Last Year: Never true    Ran Out of Food in the Last Year: Never true  Transportation Needs: No Transportation Needs (05/24/2022)   PRAPARE - Administrator, Civil Service (Medical): No    Lack of Transportation (Non-Medical): No  Physical Activity: Not on file  Stress: Not on file  Social Connections: Not on file  Intimate Partner Violence: Not At Risk (05/24/2022)   Humiliation, Afraid, Rape, and Kick questionnaire    Fear of Current or Ex-Partner: No    Emotionally Abused: No    Physically Abused: No    Sexually Abused: No    FAMILY HISTORY: Family History  Problem Relation Age of Onset   Hypertension Mother    Ovarian cancer Paternal Grandmother    COPD Father    Hyperlipidemia Father    Kidney disease Father    Diabetes Maternal Grandmother    Breast cancer Neg Hx     ALLERGIES:  is allergic to biaxin [clarithromycin], brompheniramine maleate, ceclor [cefaclor], ciprofloxacin, clinoril [sulindac], codeine sulfate, diovan [valsartan], doxycycline, erythromycin,  esomeprazole, fluoxetine, guaifenesin & derivatives, levaquin [levofloxacin in d5w], lisinopril, lodine [etodolac], macrodantin [nitrofurantoin macrocrystal], medrol [methylprednisolone], mobic [meloxicam], motrin [ibuprofen], nexium [esomeprazole magnesium], nitrofurantoin, norvasc [amlodipine besylate], omnicef [cefdinir], penicillins, prozac [fluoxetine hcl], pseudoephedrine, reglan [metoclopramide], seldane [terfenadine], sulfa antibiotics, toprol xl [metoprolol tartrate], triamterene, tussionex pennkinetic er [hydrocod poli-chlorphe poli er], vibramycin [doxycycline calcium], and amlodipine.  MEDICATIONS:  Current Outpatient Medications  Medication Sig Dispense Refill   Ascorbic Acid (VITAMIN C) 1000 MG tablet Take 1,000 mg by mouth 2 (two) times daily.     cholecalciferol (VITAMIN D3) 25 MCG (1000 UT) tablet Take 1,000 Units by mouth 2 (two) times daily.     cyanocobalamin (VITAMIN B12) 1000 MCG tablet Take 1,000 mcg by mouth daily.     fluticasone (FLONASE) 50 MCG/ACT nasal spray Place 2 sprays into both nostrils daily as needed for allergies.     gemfibrozil (LOPID) 600 MG tablet Take 600 mg by mouth 2 (two) times daily before a meal.     glipiZIDE (GLUCOTROL XL) 5 MG 24 hr tablet Take 10 mg by mouth in the morning and at bedtime.  hydrochlorothiazide (HYDRODIURIL) 25 MG tablet Take 25 mg by mouth daily.     iron polysaccharides (NIFEREX) 150 MG capsule Take 150 mg by mouth daily.     metFORMIN (GLUCOPHAGE-XR) 500 MG 24 hr tablet Take 1,000 mg by mouth 2 (two) times daily.     methocarbamol (ROBAXIN) 500 MG tablet Take 1 tablet (500 mg total) by mouth every 8 (eight) hours as needed for muscle spasms. 30 tablet 0   MIRABEGRON ER PO Take 1 tablet by mouth daily.     Multiple Vitamins-Minerals (MULTIVITAMIN WITH MINERALS) tablet Take 1 tablet by mouth daily.     Omega 3 1000 MG CAPS Take 1,000 mg by mouth daily.     pantoprazole (PROTONIX) 40 MG tablet Take 40 mg by mouth daily.      vitamin E 180 MG (400 UNITS) capsule Take 400 Units by mouth daily.     No current facility-administered medications for this visit.      Marland Kitchen  PHYSICAL EXAMINATION: ECOG PERFORMANCE STATUS: 0 - Asymptomatic  Vitals:   09/23/22 1556  BP: (!) 188/81  Pulse: 76  Resp: 20  Temp: 98.6 F (37 C)  SpO2: 100%   Filed Weights   09/23/22 1556  Weight: 146 lb (66.2 kg)    Physical Exam Constitutional:      Comments: Patient is alone.  Ambulating dependently.  HENT:     Head: Normocephalic and atraumatic.     Mouth/Throat:     Pharynx: No oropharyngeal exudate.  Eyes:     Pupils: Pupils are equal, round, and reactive to light.  Cardiovascular:     Rate and Rhythm: Normal rate and regular rhythm.  Pulmonary:     Effort: Pulmonary effort is normal. No respiratory distress.     Breath sounds: Normal breath sounds. No wheezing.  Abdominal:     General: Bowel sounds are normal. There is no distension.     Palpations: Abdomen is soft. There is no mass.     Tenderness: There is no abdominal tenderness. There is no guarding or rebound.  Musculoskeletal:        General: No tenderness. Normal range of motion.     Cervical back: Normal range of motion and neck supple.  Skin:    General: Skin is warm.  Neurological:     Mental Status: She is alert and oriented to person, place, and time.  Psychiatric:        Mood and Affect: Affect normal.    LABORATORY DATA:  I have reviewed the data as listed Lab Results  Component Value Date   WBC 3.6 (L) 09/23/2022   HGB 9.8 (L) 09/23/2022   HCT 30.1 (L) 09/23/2022   MCV 96.2 09/23/2022   PLT 129 (L) 09/23/2022   Recent Labs    05/23/22 1242 05/24/22 0519 07/26/22 1257 09/23/22 1537  NA 138 137 140 140  K 3.4* 3.7 4.2 4.4  CL 101 105 106 106  CO2 25 26 23 23   GLUCOSE 180* 210* 212* 302*  BUN 29* 24* 32* 36*  CREATININE 0.97 0.93 0.95 1.19*  CALCIUM 10.2 9.4 9.2 9.3  GFRNONAA 57* >60 59* 45*  PROT 7.6  --   --   --   ALBUMIN  4.0  --   --   --   AST 10*  --   --   --   ALT 11  --   --   --   ALKPHOS 90  --   --   --  BILITOT 0.8  --   --   --     RADIOGRAPHIC STUDIES: I have personally reviewed the radiological images as listed and agreed with the findings in the report. MR CERVICAL SPINE WO CONTRAST  Result Date: 09/20/2022 CLINICAL DATA:  Cervical spine arthritis. EXAM: MRI CERVICAL SPINE WITHOUT CONTRAST TECHNIQUE: Multiplanar, multisequence MR imaging of the cervical spine was performed. No intravenous contrast was administered. COMPARISON:  Cervical spine radiographs 12/07/2021. Neck CT 11/04/2021. FINDINGS: Alignment: Straightening of the normal cervical lordosis. Trace anterolisthesis of C2 on C3, C3 on C4, C4 on C5, and C7 on T1 and trace retrolisthesis retrolisthesis of C5 on C6, similar to the prior CT. Vertebrae: No fracture, suspicious marrow lesion, or significant marrow edema. Chronic degenerative endplate changes at C5-6. Cord: Normal signal. Posterior Fossa, vertebral arteries, paraspinal tissues: Unremarkable. Disc levels: C2-3: Minimal disc bulging, minimal uncovertebral spurring, and severe left facet arthrosis result in mild left neural foraminal stenosis without spinal stenosis. C3-4: A moderately large left paracentral disc extrusion results in moderate spinal stenosis with left ventral cord flattening. Moderate facet arthrosis. Patent neural foramina. C4-5: Small left central disc protrusion and mild right and moderate left facet arthrosis without significant stenosis. C5-6: Severe disc space narrowing. A broad-based posterior disc osteophyte complex and mild facet arthrosis result in mild spinal stenosis and mild-to-moderate bilateral neural foraminal stenosis. C6-7: Mild disc bulging, a small left paracentral disc protrusion, and mild facet arthrosis result in mild left neural foraminal stenosis without spinal stenosis. C7-T1: Disc uncovering and mild facet arthrosis without significant stenosis.  IMPRESSION: 1. Moderately large left paracentral disc extrusion at C3-4 with moderate spinal stenosis. 2. Mild spinal stenosis and mild-to-moderate neural foraminal stenosis at C5-6. 3. Advanced upper cervical facet arthrosis. Electronically Signed   By: Sebastian Ache M.D.   On: 09/20/2022 16:12    ASSESSMENT & PLAN:   Thrombocytopenia (HCC) #Chronic mild anemia-hemoglobin  10-11; FEB 2024-iron saturation 20%; ferritin elevated.  Not totally consistent with iron deficiency.   # Today hemoglobin is 9.8 post IV iron infusion-I discussed with the patient importance to consider alternative causes of her ongoing anemia-discussed regarding possibility of MDS/malignancies given her age.  I did recommend a bone marrow biopsy for further evaluation.  Patient wants to wait until her colonoscopy early May.  # ITP/ Thrombocytopenia-platelet count-130-150-STABLE. on surveillance.  Discussed with the patient the bone marrow biopsy and aspiration indication and procedure at length.  Given significant discomfort involved-I would recommend under anesthesia/with radiology in the hospital. I discussed the potential complications include-bleeding/trauma and risk of infection; which are fortunately very rare.  Patient is in agreement. Patient will sign the consent prior to the procedure. Bone marrow biopsy/aspiration is ordered.   # Elevated HTN- keep a log BPs; Repeat 170/90.  And defer to PCP, Dr.Hande.  stable.   # Fatigue-await above bone marrow biopsy.  # DISPOSITION: #  HOLD venofer # bone marrow bx in 1 month # follow up in 6 weeks- MD: labs- cbc/bmp/iron studies/ferritin; LDH---possible Venofer-Dr.B    Earna Coder, MD 09/23/2022 4:34 PM

## 2022-09-26 NOTE — Progress Notes (Deleted)
Referring Physician:  Linus Salmons, MD [address] [place],  [place] [address]  Primary Physician:  Barbette Reichmann, MD  History of Present Illness: 09/26/2022*** Kim Ballard has a history of HTN, IBS, GERD, DM, ostenopenia, hyperlipidemia.       She has multiple allergies including lodine, medrol, mobic, motrin.      Duration: *** Location: *** Quality: *** Severity: ***  Precipitating: aggravated by *** Modifying factors: made better by *** Weakness: none Timing: *** Bowel/Bladder Dysfunction: none  Conservative measures:  Physical therapy: ***  Multimodal medical therapy including regular antiinflammatories: robaxin  Injections: *** epidural steroid injections  Past Surgery: ***  Kim Ballard has ***no symptoms of cervical myelopathy.  The symptoms are causing a significant impact on the patient's life.   Review of Systems:  A 10 point review of systems is negative, except for the pertinent positives and negatives detailed in the HPI.  Past Medical History: Past Medical History:  Diagnosis Date   Allergic state    Anemia    Arthritis    Cancer    skin cancers   Chronic cystitis with hematuria    Depression    Diabetes mellitus without complication    Fibrocystic disease of both breasts    Fibromyalgia    GERD (gastroesophageal reflux disease)    Hypertension    IBS (irritable bowel syndrome)    Melanoma 07/09/2021   lt neck/shoulder area   Melanoma of neck 09/06/2021   left   Nausea    Skin cancer    Wears dentures    full upper and lower    Past Surgical History: Past Surgical History:  Procedure Laterality Date   ABDOMINAL HYSTERECTOMY     APPENDECTOMY     BREAST BIOPSY Right 2001   surgical bx    CATARACT EXTRACTION W/PHACO Right 03/08/2017   Procedure: CATARACT EXTRACTION PHACO AND INTRAOCULAR LENS PLACEMENT (IOC) RIGHT DIABETIC TORIC;  Surgeon: Lockie Mola, MD;  Location: [facility];  Service: Ophthalmology;  Laterality: Right;  Diabetic - oral meds   CATARACT EXTRACTION W/PHACO Left 04/17/2017   Procedure: CATARACT EXTRACTION PHACO AND INTRAOCULAR LENS PLACEMENT (IOC) LEFT DIABETIC;  Surgeon: Lockie Mola, MD;  Location: [facility];  Service: Ophthalmology;  Laterality: Left;  diabetic-oral meds   CHOLECYSTECTOMY     COLONOSCOPY     COLONOSCOPY WITH PROPOFOL N/A 08/27/2015   Procedure: COLONOSCOPY WITH PROPOFOL;  Surgeon: Scot Jun, MD;  Location: [facility] END[facility];  Service: Endoscopy;  Laterality: N/A;   COLONOSCOPY WITH PROPOFOL N/A 05/02/2016   Procedure: COLONOSCOPY WITH PROPOFOL;  Surgeon: Scot Jun, MD;  Location: [facility] ENDOSCOPY;  Service: Endoscopy;  Laterality: N/A;   ESOPHAGOGASTRODUODENOSCOPY     ESOPHAGOGASTRODUODENOSCOPY (EGD) WITH PROPOFOL N/A 08/27/2015   Procedure: ESOPHAGOGASTRODUODENOSCOPY (EGD) WITH PROPOFOL;  Surgeon: Scot Jun, MD;  Location: [facility] ENDOSCOPY;  Service: Endoscopy;  Laterality: N/A;   HERNIA REPAIR      Allergies: Allergies as of 09/29/2022 - Review Complete 09/23/2022  Allergen Reaction Noted   Biaxin [clarithromycin] Other (See Comments) 08/26/2015   Brompheniramine maleate  08/26/2015   Ceclor [cefaclor]  08/26/2015   Ciprofloxacin  08/26/2015   Clinoril [sulindac]  08/26/2015   Codeine sulfate  08/26/2015   Diovan [valsartan]  08/26/2015   Doxycycline  08/26/2015   Erythromycin  08/26/2015   Esomeprazole  08/26/2015   Fluoxetine Other (See Comments) 04/27/2016   Guaifenesin & derivatives  08/26/2015   Levaquin [levofloxacin in d5w]  08/26/2015   Lisinopril  08/26/2015   Lodine [etodolac]  08/26/2015   Macrodantin [nitrofurantoin macrocrystal]  08/26/2015   Medrol [methylprednisolone]  08/26/2015   Mobic [meloxicam]  08/26/2015   Motrin [ibuprofen]  08/26/2015   Nexium [esomeprazole magnesium]  08/26/2015   Nitrofurantoin Other (See Comments) 10/25/2013   Norvasc [amlodipine  besylate]  08/26/2015   Omnicef [cefdinir] Other (See Comments) 08/26/2015   Penicillins  08/26/2015   Prozac [fluoxetine hcl] Other (See Comments) 08/26/2015   Pseudoephedrine  08/26/2015   Reglan [metoclopramide]  08/26/2015   Seldane [terfenadine]  08/26/2015   Sulfa antibiotics Other (See Comments) 04/27/2016   Toprol xl [metoprolol tartrate]  08/26/2015   Triamterene  08/26/2015   Tussionex pennkinetic er [hydrocod poli-chlorphe poli er]  08/26/2015   Vibramycin [doxycycline calcium]  08/26/2015   Amlodipine Rash 05/14/2015    Medications: Outpatient Encounter Medications as of 09/29/2022  Medication Sig   Ascorbic Acid (VITAMIN C) 1000 MG tablet Take 1,000 mg by mouth 2 (two) times daily.   cholecalciferol (VITAMIN D3) 25 MCG (1000 UT) tablet Take 1,000 Units by mouth 2 (two) times daily.   cyanocobalamin (VITAMIN B12) 1000 MCG tablet Take 1,000 mcg by mouth daily.   fluticasone (FLONASE) 50 MCG/ACT nasal spray Place 2 sprays into both nostrils daily as needed for allergies.   gemfibrozil (LOPID) 600 MG tablet Take 600 mg by mouth 2 (two) times daily before a meal.   glipiZIDE (GLUCOTROL XL) 5 MG 24 hr tablet Take 10 mg by mouth in the morning and at bedtime.   hydrochlorothiazide (HYDRODIURIL) 25 MG tablet Take 25 mg by mouth daily.   iron polysaccharides (NIFEREX) 150 MG capsule Take 150 mg by mouth daily.   metFORMIN (GLUCOPHAGE-XR) 500 MG 24 hr tablet Take 1,000 mg by mouth 2 (two) times daily.   methocarbamol (ROBAXIN) 500 MG tablet Take 1 tablet (500 mg total) by mouth every 8 (eight) hours as needed for muscle spasms.   MIRABEGRON ER PO Take 1 tablet by mouth daily.   Multiple Vitamins-Minerals (MULTIVITAMIN WITH MINERALS) tablet Take 1 tablet by mouth daily.   Omega 3 1000 MG CAPS Take 1,000 mg by mouth daily.   pantoprazole (PROTONIX) 40 MG tablet Take 40 mg by mouth daily.   vitamin E 180 MG (400 UNITS) capsule Take 400 Units by mouth daily.   No facility-administered  encounter medications on file as of 09/29/2022.    Social History: Social History   Tobacco Use   Smoking status: Former    Types: Cigarettes    Quit date: 1960    Years since quitting: 64.3   Smokeless tobacco: Never  Vaping Use   Vaping Use: Never used  Substance Use Topics   Alcohol use: No   Drug use: No    Family Medical History: Family History  Problem Relation Age of Onset   Hypertension Mother    Ovarian cancer Paternal Grandmother    COPD Father    Hyperlipidemia Father    Kidney disease Father    Diabetes Maternal Grandmother    Breast cancer Neg Hx     Physical Examination: There were no vitals filed for this visit.  General: Patient is well developed, well nourished, calm, collected, and in no apparent distress. Attention to examination is appropriate.  Respiratory: Patient is breathing without any difficulty.   NEUROLOGICAL:     Awake, alert, oriented to person, place, and time.  Speech is clear and fluent. Fund of knowledge is appropriate.   Cranial Nerves:  Pupils equal round and reactive to light.  Facial tone is symmetric.    *** ROM of cervical spine *** pain *** posterior cervical tenderness. *** tenderness in bilateral trapezial region.   *** ROM of lumbar spine *** pain *** posterior lumbar tenderness.   No abnormal lesions on exposed skin.   Strength: Side Biceps Triceps Deltoid Interossei Grip Wrist Ext. Wrist Flex.  R L Side Iliopsoas Quads Hamstring PF DF EHL  R L Reflexes are ***2+ and symmetric at the biceps, triceps, brachioradialis, patella and achilles.   Hoffman's is absent.  Clonus is not present.   Bilateral upper and lower extremity sensation is intact to light touch.     Gait is normal.   ***No difficulty with tandem gait.    Medical Decision Making  Imaging: MRI of cervical spine dated 09/16/22:  FINDINGS: Alignment: Straightening of the normal cervical  lordosis. Trace anterolisthesis of C2 on C3, C3 on C4, C4 on C5, and C7 on T1 and trace retrolisthesis retrolisthesis of C5 on C6, similar to the prior CT.   Vertebrae: No fracture, suspicious marrow lesion, or significant marrow edema. Chronic degenerative endplate changes at C5-6.   Cord: Normal signal.   Posterior Fossa, vertebral arteries, paraspinal tissues: Unremarkable.   Disc levels:   C2-3: Minimal disc bulging, minimal uncovertebral spurring, and severe left facet arthrosis result in mild left neural foraminal stenosis without spinal stenosis.   C3-4: A moderately large left paracentral disc extrusion results in moderate spinal stenosis with left ventral cord flattening. Moderate facet arthrosis. Patent neural foramina.   C4-5: Small left central disc protrusion and mild right and moderate left facet arthrosis without significant stenosis.   C5-6: Severe disc space narrowing. A broad-based posterior disc osteophyte complex and mild facet arthrosis result in mild spinal stenosis and mild-to-moderate bilateral neural foraminal stenosis.   C6-7: Mild disc bulging, a small left paracentral disc protrusion, and mild facet arthrosis result in mild left neural foraminal stenosis without spinal stenosis.   C7-T1: Disc uncovering and mild facet arthrosis without significant stenosis.   IMPRESSION: 1. Moderately large left paracentral disc extrusion at C3-4 with moderate spinal stenosis. 2. Mild spinal stenosis and mild-to-moderate neural foraminal stenosis at C5-6. 3. Advanced upper cervical facet arthrosis.     Electronically Signed   By: Sebastian Ache M.D.   On: 09/20/2022 16:12  I have personally reviewed the images and agree with the above interpretation.  Assessment and Plan: Kim Ballard is a pleasant 88 y.o. female has ***  Treatment options discussed with patient and following plan made:   - Order for physical therapy for *** spine ***. Patient to call to  schedule appointment. *** - Continue current medications including ***. Reviewed dosing and side effects.  - Prescription for ***. Reviewed dosing and side effects. Take with food.  - Prescription for *** to take prn muscle spasms. Reviewed dosing and side effects. Discussed this can cause drowsiness.  - MRI of *** to further evaluate *** radiculopathy. No improvement time or medications (***).  - Referral to PMR at [facility] to discuss possible *** injections.  - Will schedule phone visit to review MRI results once I get them back.   I spent a total of *** minutes in face-to-face and non-face-to-face activities related to this patient's care today including  review of outside records, review of imaging, review of symptoms, physical exam, discussion of differential diagnosis, discussion of treatment options, and documentation.   Thank you for involving me in the care of this patient.   Drake Leach PA-C Dept. of Neurosurgery

## 2022-09-27 ENCOUNTER — Ambulatory Visit: Payer: Medicare HMO

## 2022-09-29 ENCOUNTER — Ambulatory Visit: Payer: Medicare HMO | Admitting: Orthopedic Surgery

## 2022-10-03 ENCOUNTER — Telehealth: Payer: Self-pay | Admitting: *Deleted

## 2022-10-03 NOTE — Telephone Encounter (Signed)
Pt has agreed to 10/28/22 for her BM biopsy. Arrival at 7:30 am. NPO after midnight. Will need a driver. [facility]

## 2022-10-04 ENCOUNTER — Encounter: Payer: Self-pay | Admitting: Internal Medicine

## 2022-10-04 ENCOUNTER — Ambulatory Visit: Payer: Medicare HMO | Admitting: Orthopedic Surgery

## 2022-10-05 ENCOUNTER — Ambulatory Visit
Admission: RE | Admit: 2022-10-05 | Discharge: 2022-10-05 | Disposition: A | Discharge status: Home / Self Care | Payer: Medicare HMO | Source: Ambulatory Visit | Attending: Internal Medicine | Admitting: Internal Medicine

## 2022-10-05 ENCOUNTER — Encounter: Payer: Self-pay | Admitting: Internal Medicine

## 2022-10-05 ENCOUNTER — Ambulatory Visit: Payer: Medicare HMO | Admitting: Anesthesiology

## 2022-10-05 ENCOUNTER — Encounter
Admission: RE | Disposition: A | Discharge status: Home / Self Care | Payer: Self-pay | Source: Ambulatory Visit | Attending: Internal Medicine

## 2022-10-05 DIAGNOSIS — K589 Irritable bowel syndrome without diarrhea: Secondary | ICD-10-CM | POA: Diagnosis not present

## 2022-10-05 DIAGNOSIS — Z7984 Long term (current) use of oral hypoglycemic drugs: Secondary | ICD-10-CM | POA: Diagnosis not present

## 2022-10-05 DIAGNOSIS — K643 Fourth degree hemorrhoids: Secondary | ICD-10-CM | POA: Insufficient documentation

## 2022-10-05 DIAGNOSIS — Z87891 Personal history of nicotine dependence: Secondary | ICD-10-CM | POA: Diagnosis not present

## 2022-10-05 DIAGNOSIS — R195 Other fecal abnormalities: Secondary | ICD-10-CM | POA: Insufficient documentation

## 2022-10-05 DIAGNOSIS — K2289 Other specified disease of esophagus: Secondary | ICD-10-CM | POA: Diagnosis not present

## 2022-10-05 DIAGNOSIS — I10 Essential (primary) hypertension: Secondary | ICD-10-CM | POA: Insufficient documentation

## 2022-10-05 DIAGNOSIS — D122 Benign neoplasm of ascending colon: Secondary | ICD-10-CM | POA: Diagnosis not present

## 2022-10-05 DIAGNOSIS — N6011 Diffuse cystic mastopathy of right breast: Secondary | ICD-10-CM | POA: Insufficient documentation

## 2022-10-05 DIAGNOSIS — N6012 Diffuse cystic mastopathy of left breast: Secondary | ICD-10-CM | POA: Diagnosis not present

## 2022-10-05 DIAGNOSIS — K642 Third degree hemorrhoids: Secondary | ICD-10-CM | POA: Diagnosis not present

## 2022-10-05 DIAGNOSIS — D141 Benign neoplasm of larynx: Secondary | ICD-10-CM | POA: Diagnosis not present

## 2022-10-05 DIAGNOSIS — R1310 Dysphagia, unspecified: Secondary | ICD-10-CM | POA: Diagnosis not present

## 2022-10-05 DIAGNOSIS — M797 Fibromyalgia: Secondary | ICD-10-CM | POA: Insufficient documentation

## 2022-10-05 DIAGNOSIS — K5730 Diverticulosis of large intestine without perforation or abscess without bleeding: Secondary | ICD-10-CM | POA: Diagnosis not present

## 2022-10-05 DIAGNOSIS — N3021 Other chronic cystitis with hematuria: Secondary | ICD-10-CM | POA: Insufficient documentation

## 2022-10-05 DIAGNOSIS — E119 Type 2 diabetes mellitus without complications: Secondary | ICD-10-CM | POA: Insufficient documentation

## 2022-10-05 DIAGNOSIS — M1990 Unspecified osteoarthritis, unspecified site: Secondary | ICD-10-CM | POA: Insufficient documentation

## 2022-10-05 DIAGNOSIS — K2100 Gastro-esophageal reflux disease with esophagitis, without bleeding: Secondary | ICD-10-CM | POA: Insufficient documentation

## 2022-10-05 DIAGNOSIS — Z1211 Encounter for screening for malignant neoplasm of colon: Secondary | ICD-10-CM | POA: Diagnosis not present

## 2022-10-05 DIAGNOSIS — D124 Benign neoplasm of descending colon: Secondary | ICD-10-CM | POA: Diagnosis not present

## 2022-10-05 DIAGNOSIS — D123 Benign neoplasm of transverse colon: Secondary | ICD-10-CM | POA: Insufficient documentation

## 2022-10-05 HISTORY — PX: COLONOSCOPY: SHX5424

## 2022-10-05 HISTORY — PX: ESOPHAGOGASTRODUODENOSCOPY: SHX5428

## 2022-10-05 LAB — GLUCOSE, CAPILLARY: Glucose-Capillary: 171 mg/dL — ABNORMAL HIGH (ref 70–99)

## 2022-10-05 SURGERY — COLONOSCOPY
Anesthesia: General

## 2022-10-05 SURGERY — ESOPHAGOGASTRODUODENOSCOPY (EGD) WITH PROPOFOL
Anesthesia: General

## 2022-10-05 MED ORDER — FENTANYL CITRATE (PF) 100 MCG/2ML IJ SOLN
INTRAMUSCULAR | Status: AC
  Filled 2022-10-05: qty 2

## 2022-10-05 MED ORDER — MIDAZOLAM HCL 2 MG/2ML IJ SOLN
INTRAMUSCULAR | Status: DC | PRN
  Administered 2022-10-05: .5 mg via INTRAVENOUS

## 2022-10-05 MED ORDER — SEVOFLURANE IN SOLN
RESPIRATORY_TRACT | Status: AC
  Filled 2022-10-05: qty 250

## 2022-10-05 MED ORDER — PROPOFOL 10 MG/ML IV BOLUS
INTRAVENOUS | Status: DC | PRN
  Administered 2022-10-05 (×2): 20 mg via INTRAVENOUS
  Administered 2022-10-05: 10 mg via INTRAVENOUS
  Administered 2022-10-05 (×3): 20 mg via INTRAVENOUS

## 2022-10-05 MED ORDER — FENTANYL CITRATE (PF) 100 MCG/2ML IJ SOLN
INTRAMUSCULAR | Status: DC | PRN
  Administered 2022-10-05 (×4): 25 ug via INTRAVENOUS

## 2022-10-05 MED ORDER — SODIUM CHLORIDE 0.9 % IV SOLN: INTRAVENOUS | Status: DC

## 2022-10-05 MED ORDER — PROPOFOL 500 MG/50ML IV EMUL
INTRAVENOUS | Status: DC | PRN
  Administered 2022-10-05: 75 ug/kg/min via INTRAVENOUS

## 2022-10-05 MED ORDER — MIDAZOLAM HCL 2 MG/2ML IJ SOLN
INTRAMUSCULAR | Status: AC
  Filled 2022-10-05: qty 2

## 2022-10-05 NOTE — Op Note (Signed)
[facility] Gastroenterology Patient Name: Kim Ballard Procedure Date: 10/05/2022 10:34 AM MRN: [id number] Account #: [id number] Date of Birth: 06-18-1934 Admit Type: Outpatient Age: 88 Room: [facility] ENDO ROOM 2 Gender: Female Note Status: Finalized Instrument Name: Patton Salles Endoscope [id number] Procedure:             Upper GI endoscopy Indications:           Dysphagia, Gastro-esophageal reflux disease Providers:             Boykin Nearing. Norma Fredrickson MD, MD Referring MD:          Barbette Reichmann, MD (Referring MD) Medicines:             Propofol per Anesthesia Complications:         No immediate complications. Procedure:             Pre-Anesthesia Assessment:                        - The risks and benefits of the procedure and the                         sedation options and risks were discussed with the                         patient. All questions were answered and informed                         consent was obtained.                        - Patient identification and proposed procedure were                         verified prior to the procedure by the nurse. The                         procedure was verified in the procedure room.                        - After reviewing the risks and benefits, the patient                         was deemed in satisfactory condition to undergo the                         procedure.                        - ASA Grade Assessment: III - A patient with severe                         systemic disease.                        After obtaining informed consent, the endoscope was                         passed under direct vision. Throughout the procedure,  the patient's blood pressure, pulse, and oxygen                         saturations were monitored continuously. The Endoscope                         was introduced through the mouth, and advanced to the                         third part of duodenum. The upper GI  endoscopy was                         accomplished without difficulty. The patient tolerated                         the procedure well. Findings:      The Z-line was irregular and was found in the distal esophagus. Mucosa       was biopsied with a cold forceps for histology. One specimen bottle was       sent to pathology.      No endoscopic abnormality was evident in the esophagus to explain the       patient's complaint of dysphagia. It was decided, however, to proceed       with dilation in the distal esophagus. The scope was withdrawn. Dilation       was performed with a Maloney dilator with no resistance at 50 Fr.      The stomach was normal.      The examined duodenum was normal.      The exam was otherwise without abnormality.      A small non-obstructing mucosal nodule was found in the subglottic space. Impression:            - Z-line irregular, in the distal esophagus. Biopsied.                        - No endoscopic esophageal abnormality to explain                         patient's dysphagia. Esophagus dilated. Dilated.                        - Normal stomach.                        - Normal examined duodenum.                        - The examination was otherwise normal.                        - A mucosal nodule was found in the subglottic space.                         This lesion is benign. Recommendation:        - Await pathology results.                        - Monitor results to esophageal dilation                        -  Proceed with colonoscopy                        - Refer to an ENT specialist at appointment to be                         scheduled.                        - ENT to evaluate nodule in posterior pharynx. Procedure Code(s):     --- Professional ---                        [phone], Esophagogastroduodenoscopy, flexible,                         transoral; with biopsy, single or multiple                        43450, Dilation of esophagus, by unguided  sound or                         bougie, single or multiple passes Diagnosis Code(s):     --- Professional ---                        K21.9, Gastro-esophageal reflux disease without                         esophagitis                        D14.1, Benign neoplasm of larynx                        R13.10, Dysphagia, unspecified                        K22.89, Other specified disease of esophagus CPT copyright 2022 American [employer] Association. All rights reserved. The codes documented in this report are preliminary and upon coder review may  be revised to meet current compliance requirements. Stanton Kidney MD, MD 10/05/2022 11:18:35 AM This report has been signed electronically. Number of Addenda: 0 Note Initiated On: 10/05/2022 10:34 AM Estimated Blood Loss:  Estimated blood loss: none. Estimated blood loss: none.      [facility]

## 2022-10-05 NOTE — H&P (Signed)
Outpatient short stay form Pre-procedure 10/05/2022 10:43 AM Zaida Reiland [name], M.D.  Primary Physician: Barbette Reichmann, M.D.  Reason for visit: Positive Cologuard test  History of present illness: 88 year old female presents for colonoscopy after receiving a positive Cologuard test from her primary care physician.  Patient denies any lower GI symptoms such as abdominal pain, change in bowel habits or rectal bleeding.  Patient denies any involuntary weight loss.  Patient denies any upper GI symptoms such as nausea, vomiting or anorexia.   No current facility-administered medications for this encounter.  Medications Prior to Admission  Medication Sig Dispense Refill Last Dose   Ascorbic Acid (VITAMIN C) 1000 MG tablet Take 1,000 mg by mouth 2 (two) times daily.      cholecalciferol (VITAMIN D3) 25 MCG (1000 UT) tablet Take 1,000 Units by mouth 2 (two) times daily.      cyanocobalamin (VITAMIN B12) 1000 MCG tablet Take 1,000 mcg by mouth daily.      fluticasone (FLONASE) 50 MCG/ACT nasal spray Place 2 sprays into both nostrils daily as needed for allergies.      gemfibrozil (LOPID) 600 MG tablet Take 600 mg by mouth 2 (two) times daily before a meal.      glipiZIDE (GLUCOTROL XL) 5 MG 24 hr tablet Take 10 mg by mouth in the morning and at bedtime.      hydrochlorothiazide (HYDRODIURIL) 25 MG tablet Take 25 mg by mouth daily.      iron polysaccharides (NIFEREX) 150 MG capsule Take 150 mg by mouth daily.      metFORMIN (GLUCOPHAGE-XR) 500 MG 24 hr tablet Take 1,000 mg by mouth 2 (two) times daily.      methocarbamol (ROBAXIN) 500 MG tablet Take 1 tablet (500 mg total) by mouth every 8 (eight) hours as needed for muscle spasms. 30 tablet 0    MIRABEGRON ER PO Take 1 tablet by mouth daily.      Multiple Vitamins-Minerals (MULTIVITAMIN WITH MINERALS) tablet Take 1 tablet by mouth daily.      Omega 3 1000 MG CAPS Take 1,000 mg by mouth daily.      pantoprazole (PROTONIX) 40 MG tablet Take 40 mg  by mouth daily.      vitamin E 180 MG (400 UNITS) capsule Take 400 Units by mouth daily.        Allergies  Allergen Reactions   Biaxin [Clarithromycin] Other (See Comments)    GI UPSET    Brompheniramine Maleate    Ceclor [Cefaclor]    Ciprofloxacin    Clinoril [Sulindac]    Codeine Sulfate    Diovan [Valsartan]    Doxycycline     Able to tolerate low dose   Erythromycin    Esomeprazole    Fluoxetine Other (See Comments)    UNKNOWN   Guaifenesin & Derivatives    Levaquin [Levofloxacin In D5w]     Can take in low doses   Lisinopril    Lodine [Etodolac]    Macrodantin [Nitrofurantoin Macrocrystal]    Medrol [Methylprednisolone]    Mobic [Meloxicam]    Motrin [Ibuprofen]    Nexium [Esomeprazole Magnesium]    Nitrofurantoin Other (See Comments)   Norvasc [Amlodipine Besylate]    Omnicef [Cefdinir] Other (See Comments)    Developed whelps 10 days laster    Penicillins    Prozac [Fluoxetine Hcl] Other (See Comments)    Makes her feel crazy    Pseudoephedrine    Reglan [Metoclopramide]    Seldane [Terfenadine]    Sulfa Antibiotics  Other (See Comments)   Toprol Xl [Metoprolol Tartrate]    Triamterene    Tussionex Pennkinetic Er [Hydrocod Poli-Chlorphe Poli Er]    Vibramycin [Doxycycline Calcium]    Amlodipine Rash    Other reaction(s): Other (See Comments) Edema Edema     Past Medical History:  Diagnosis Date   Allergic state    Anemia    Arthritis    Cancer (HCC)    skin cancers   Chronic cystitis with hematuria    Depression    Diabetes mellitus without complication (HCC)    Fibrocystic disease of both breasts    Fibromyalgia    GERD (gastroesophageal reflux disease)    Hypertension    IBS (irritable bowel syndrome)    Melanoma (HCC) 07/09/2021   lt neck/shoulder area   Melanoma of neck (HCC) 09/06/2021   left   Nausea    Skin cancer    Wears dentures    full upper and lower    Review of systems:  Otherwise negative.    Physical  Exam  Gen: Alert, oriented. Appears stated age.  HEENT: [place]/AT. PERRLA. Lungs: CTA, no wheezes. CV: RR nl S1, S2. Abd: soft, benign, no masses. BS+ Ext: No edema. Pulses 2+    Planned procedures: Proceed with colonoscopy. The patient understands the nature of the planned procedure, indications, risks, alternatives and potential complications including but not limited to bleeding, infection, perforation, damage to internal organs and possible oversedation/side effects from anesthesia. The patient agrees and gives consent to proceed.  Please refer to procedure notes for findings, recommendations and patient disposition/instructions.     Delayne Sanzo [name], M.D. Gastroenterology 10/05/2022  10:43 AM

## 2022-10-05 NOTE — Op Note (Signed)
[facility] Gastroenterology Patient Name: Kim Ballard Procedure Date: 10/05/2022 10:33 AM MRN: [id number] Account #: [id number] Date of Birth: 01/04/1935 Admit Type: Outpatient Age: 88 Room: [facility] ENDO ROOM 2 Gender: Female Note Status: Finalized Instrument Name: Colonoscope [id number] Procedure:             Colonoscopy Indications:           Positive Cologuard test Providers:             Boykin Nearing. Norma Fredrickson MD, MD Referring MD:          Barbette Reichmann, MD (Referring MD) Medicines:             Propofol per Anesthesia Complications:         No immediate complications. Estimated blood loss: None. Procedure:             Pre-Anesthesia Assessment:                        - The risks and benefits of the procedure and the                         sedation options and risks were discussed with the                         patient. All questions were answered and informed                         consent was obtained.                        - Patient identification and proposed procedure were                         verified prior to the procedure by the nurse. The                         procedure was verified in the procedure room.                        - ASA Grade Assessment: III - A patient with severe                         systemic disease.                        - After reviewing the risks and benefits, the patient                         was deemed in satisfactory condition to undergo the                         procedure.                        After obtaining informed consent, the colonoscope was                         passed under direct vision. Throughout the procedure,  the patient's blood pressure, pulse, and oxygen                         saturations were monitored continuously. The                         Colonoscope was introduced through the anus and                         advanced to the the cecum, identified by appendiceal                          orifice and ileocecal valve. The colonoscopy was                         performed without difficulty. The patient tolerated                         the procedure well. The quality of the bowel                         preparation was good. The ileocecal valve, appendiceal                         orifice, and rectum were photographed. Findings:      The perianal exam findings include internal hemorrhoids that do not       return to the anal canal, thus continuously prolapsed (Grade IV).      Non-bleeding internal hemorrhoids were found during retroflexion. The       hemorrhoids were Grade III (internal hemorrhoids that prolapse but       require manual reduction).      Multiple medium-mouthed and small-mouthed diverticula were found in the       left colon. There was no evidence of diverticular bleeding.      A single small-mouthed diverticulum was found in the ascending colon.      A 10 mm polyp was found in the descending colon. The polyp was       semi-pedunculated. The polyp was removed with a hot snare. Resection and       retrieval were complete.      An 11 mm polyp was found in the transverse colon. The polyp was       semi-pedunculated. The polyp was removed with a hot snare. Resection and       retrieval were complete.      A 10 mm polyp was found in the transverse colon. The polyp was sessile.       The polyp was removed with a hot snare. Resection and retrieval were       complete.      A 12 mm polyp was found in the ascending colon. The polyp was       semi-pedunculated. The polyp was removed with a hot snare. Resection and       retrieval were complete.      The exam was otherwise without abnormality. Impression:            - Internal hemorrhoids that do not return to the anal                         canal,  thus continuously prolapsed (Grade IV) found on                         perianal exam.                        - Non-bleeding internal hemorrhoids.                         - Moderate diverticulosis in the left colon. There was                         no evidence of diverticular bleeding.                        - Diverticulosis in the ascending colon.                        - One 10 mm polyp in the descending colon, removed                         with a hot snare. Resected and retrieved.                        - One 11 mm polyp in the transverse colon, removed                         with a hot snare. Resected and retrieved.                        - One 10 mm polyp in the transverse colon, removed                         with a hot snare. Resected and retrieved.                        - One 12 mm polyp in the ascending colon, removed with                         a hot snare. Resected and retrieved.                        - The examination was otherwise normal. Recommendation:        - Await pathology results from EGD, also performed                         today.                        - Proceed with colonoscopy                        - Patient has a contact number available for                         emergencies. The signs and symptoms of potential                         delayed complications were discussed with the patient.  Return to normal activities tomorrow. Written                         discharge instructions were provided to the patient.                        - Resume previous diet.                        - Continue present medications.                        - Await pathology results.                        - If polyps are benign or adenomatous without                         dysplasia, I will advise NO further colonoscopy due to                         advanced age and/or severe comorbidity.                        - Return to nurse practitioner in 3 months.                        - Telephone GI office to schedule appointment.                        - The findings and recommendations were discussed with                          the patient. Procedure Code(s):     --- Professional ---                        [phone], Colonoscopy, flexible; with removal of                         tumor(s), polyp(s), or other lesion(s) by snare                         technique Diagnosis Code(s):     --- Professional ---                        K57.30, Diverticulosis of large intestine without                         perforation or abscess without bleeding                        R19.5, Other fecal abnormalities                        D12.2, Benign neoplasm of ascending colon                        D12.3, Benign neoplasm of transverse colon (hepatic  flexure or splenic flexure)                        D12.4, Benign neoplasm of descending colon                        K64.3, Fourth degree hemorrhoids CPT copyright 2022 American Medical Association. All rights reserved. The codes documented in this report are preliminary and upon coder review may  be revised to meet current compliance requirements. Stanton Kidney MD, MD 10/05/2022 11:45:32 AM This report has been signed electronically. Number of Addenda: 0 Note Initiated On: 10/05/2022 10:33 AM Scope Withdrawal Time: 0 hours 6 minutes 5 seconds  Total Procedure Duration: 0 hours 16 minutes 5 seconds  Estimated Blood Loss:  Estimated blood loss: none.      [facility]

## 2022-10-05 NOTE — H&P (Signed)
Outpatient short stay form Pre-procedure 10/05/2022 10:53 AM Kim Ballard Kim Ballard, M.D.  Primary Physician: Barbette Reichmann, M.D.  Reason for visit:  Dysphagia, GERD  History of present illness:  88 y/o patient with personal history of GERD and dysphagia with "ring" requiring previous esophageal dilation. Otherwise, denies abdominal pain, anorexia, unexplained weight loss or GERD symptoms unresponsive to therapy.   No current facility-administered medications for this encounter.  Medications Prior to Admission  Medication Sig Dispense Refill Last Dose   Ascorbic Acid (VITAMIN C) 1000 MG tablet Take 1,000 mg by mouth 2 (two) times daily.   Past Week   cholecalciferol (VITAMIN D3) 25 MCG (1000 UT) tablet Take 1,000 Units by mouth 2 (two) times daily.   Past Week   cyanocobalamin (VITAMIN B12) 1000 MCG tablet Take 1,000 mcg by mouth daily.   Past Week   gemfibrozil (LOPID) 600 MG tablet Take 600 mg by mouth 2 (two) times daily before a meal.   Past Week   glipiZIDE (GLUCOTROL XL) 5 MG 24 hr tablet Take 10 mg by mouth in the morning and at bedtime.   Past Week   hydrochlorothiazide (HYDRODIURIL) 25 MG tablet Take 25 mg by mouth daily.   10/05/2022 at 0615   iron polysaccharides (NIFEREX) 150 MG capsule Take 150 mg by mouth daily.   Past Week   metFORMIN (GLUCOPHAGE-XR) 500 MG 24 hr tablet Take 1,000 mg by mouth 2 (two) times daily.   Past Week   methocarbamol (ROBAXIN) 500 MG tablet Take 1 tablet (500 mg total) by mouth every 8 (eight) hours as needed for muscle spasms. 30 tablet 0 Past Week   MIRABEGRON ER PO Take 1 tablet by mouth daily.   Past Week   Multiple Vitamins-Minerals (MULTIVITAMIN WITH MINERALS) tablet Take 1 tablet by mouth daily.   Past Week   Omega 3 1000 MG CAPS Take 1,000 mg by mouth daily.   Past Week   pantoprazole (PROTONIX) 40 MG tablet Take 40 mg by mouth daily.   Past Week   vitamin E 180 MG (400 UNITS) capsule Take 400 Units by mouth daily.   Past Week   fluticasone  (FLONASE) 50 MCG/ACT nasal spray Place 2 sprays into both nostrils daily as needed for allergies.    at prn     Allergies  Allergen Reactions   Biaxin [Clarithromycin] Other (See Comments)    GI UPSET    Brompheniramine Maleate    Ceclor [Cefaclor]    Ciprofloxacin    Clinoril [Sulindac]    Codeine Sulfate    Diovan [Valsartan]    Doxycycline     Able to tolerate low dose   Erythromycin    Esomeprazole    Fluoxetine Other (See Comments)    UNKNOWN   Guaifenesin & Derivatives    Levaquin [Levofloxacin In D5w]     Can take in low doses   Lisinopril    Lodine [Etodolac]    Macrodantin [Nitrofurantoin Macrocrystal]    Medrol [Methylprednisolone]    Mobic [Meloxicam]    Motrin [Ibuprofen]    Nexium [Esomeprazole Magnesium]    Nitrofurantoin Other (See Comments)   Norvasc [Amlodipine Besylate]    Omnicef [Cefdinir] Other (See Comments)    Developed whelps 10 days laster    Penicillins    Prozac [Fluoxetine Hcl] Other (See Comments)    Makes her feel crazy    Pseudoephedrine    Reglan [Metoclopramide]    Seldane [Terfenadine]    Sulfa Antibiotics Other (See Comments)   Toprol  Xl [Metoprolol Tartrate]    Triamterene    Tussionex Pennkinetic Er [Hydrocod Poli-Chlorphe Poli Er]    Vibramycin [Doxycycline Calcium]    Amlodipine Rash    Other reaction(s): Other (See Comments) Edema Edema     Past Medical History:  Diagnosis Date   Allergic state    Anemia    Arthritis    Cancer (HCC)    skin cancers   Chronic cystitis with hematuria    Depression    Diabetes mellitus without complication (HCC)    Fibrocystic disease of both breasts    Fibromyalgia    GERD (gastroesophageal reflux disease)    Hypertension    IBS (irritable bowel syndrome)    Melanoma (HCC) 07/09/2021   lt neck/shoulder area   Melanoma of neck (HCC) 09/06/2021   left   Nausea    Skin cancer    Wears dentures    full upper and lower    Review of systems:  Otherwise negative.     Physical Exam  Gen: Alert, oriented. Appears stated age.  HEENT: [place]/AT. PERRLA. Lungs: CTA, no wheezes. CV: RR nl S1, S2. Abd: soft, benign, no masses. BS+ Ext: No edema. Pulses 2+    Planned procedures: Proceed with EGD. The patient understands the nature of the planned procedure, indications, risks, alternatives and potential complications including but not limited to bleeding, infection, perforation, damage to internal organs and possible oversedation/side effects from anesthesia. The patient agrees and gives consent to proceed.  Please refer to procedure notes for findings, recommendations and patient disposition/instructions.     Kim Ballard Kim Ballard, M.D. Gastroenterology 10/05/2022  10:53 AM

## 2022-10-05 NOTE — Transfer of Care (Signed)
Immediate Anesthesia Transfer of Care Note  Patient: Kim Ballard  Procedure(s) Performed: COLONOSCOPY ESOPHAGOGASTRODUODENOSCOPY (EGD)  Patient Location: PACU  Anesthesia Type:General  Level of Consciousness: awake  Airway & Oxygen Therapy: Patient Spontanous Breathing and Patient connected to nasal cannula oxygen  Post-op Assessment: Report given to RN and Post -op Vital signs reviewed and stable  Post vital signs: Reviewed and stable  Last Vitals:  Vitals Value Taken Time  BP 133/56 10/05/22 1141  Temp 36.1 C 10/05/22 1141  Pulse 71 10/05/22 1142  Resp 10 10/05/22 1142  SpO2 97 % 10/05/22 1142  Vitals shown include unvalidated device data.  Last Pain:  Vitals:   10/05/22 1141  TempSrc: Temporal  PainSc: Asleep         Complications: No notable events documented.

## 2022-10-05 NOTE — Anesthesia Postprocedure Evaluation (Signed)
Anesthesia Post Note  Patient: Kim Ballard  Procedure(s) Performed: COLONOSCOPY ESOPHAGOGASTRODUODENOSCOPY (EGD)  Patient location during evaluation: Endoscopy Anesthesia Type: General Level of consciousness: awake and alert Pain management: pain level controlled Vital Signs Assessment: post-procedure vital signs reviewed and stable Respiratory status: spontaneous breathing, nonlabored ventilation, respiratory function stable and patient connected to nasal cannula oxygen Cardiovascular status: blood pressure returned to baseline and stable Postop Assessment: no apparent nausea or vomiting Anesthetic complications: no   No notable events documented.   Last Vitals:  Vitals:   10/05/22 1141 10/05/22 1151  BP: (!) 133/56 (!) 156/76  Pulse: 70 77  Resp: 10 13  Temp: (!) 36.1 C   SpO2: 97% 100%    Last Pain:  Vitals:   10/05/22 1151  TempSrc:   PainSc: 0-No pain                 Cleda Mccreedy Noelani Harbach

## 2022-10-05 NOTE — Anesthesia Preprocedure Evaluation (Signed)
Anesthesia Evaluation  Patient identified by MRN, date of birth, ID band Patient awake    Reviewed: Allergy & Precautions, NPO status , Patient's Chart, lab work & pertinent test results  History of Anesthesia Complications Negative for: history of anesthetic complications  Airway Mallampati: III  TM Distance: <3 FB Neck ROM: full    Dental  (+) Upper Dentures, Lower Dentures   Pulmonary neg shortness of breath, former smoker   Pulmonary exam normal        Cardiovascular hypertension, (-) angina (-) Past MI Normal cardiovascular exam     Neuro/Psych  Neuromuscular disease  negative psych ROS   GI/Hepatic Neg liver ROS,GERD  Controlled,,  Endo/Other  diabetes, Type 2    Renal/GU negative Renal ROS  negative genitourinary   Musculoskeletal   Abdominal   Peds  Hematology negative hematology ROS (+)   Anesthesia Other Findings Patient is NPO appropriate and reports no nausea or vomiting today.  Past Medical History: No date: Allergic state No date: Anemia No date: Arthritis No date: Cancer [facility])     Comment:  skin cancers No date: Chronic cystitis with hematuria No date: Depression No date: Diabetes mellitus without complication (HCC) No date: Fibrocystic disease of both breasts No date: Fibromyalgia No date: GERD (gastroesophageal reflux disease) No date: Hypertension No date: IBS (irritable bowel syndrome) 07/09/2021: Melanoma (HCC)     Comment:  lt neck/shoulder area 09/06/2021: Melanoma of neck (HCC)     Comment:  left No date: Nausea No date: Skin cancer No date: Wears dentures     Comment:  full upper and lower  Past Surgical History: No date: ABDOMINAL HYSTERECTOMY No date: APPENDECTOMY 2001: BREAST BIOPSY; Right     Comment:  surgical bx  03/08/2017: CATARACT EXTRACTION W/PHACO; Right     Comment:  Procedure: CATARACT EXTRACTION PHACO AND INTRAOCULAR               LENS PLACEMENT (IOC) RIGHT  DIABETIC TORIC;  Surgeon:               Lockie Mola, MD;  Location: [facility];              Service: Ophthalmology;  Laterality: Right;  Diabetic -               oral meds 04/17/2017: CATARACT EXTRACTION W/PHACO; Left     Comment:  Procedure: CATARACT EXTRACTION PHACO AND INTRAOCULAR               LENS PLACEMENT (IOC) LEFT DIABETIC;  Surgeon: Lockie Mola, MD;  Location: [facility];  Service:               Ophthalmology;  Laterality: Left;  diabetic-oral meds No date: CHOLECYSTECTOMY No date: COLONOSCOPY 08/27/2015: COLONOSCOPY WITH PROPOFOL; N/A     Comment:  Procedure: COLONOSCOPY WITH PROPOFOL;  Surgeon: Scot Jun, MD;  Location: [facility] ENDOSCOPY;  Service:               Endoscopy;  Laterality: N/A; 05/02/2016: COLONOSCOPY WITH PROPOFOL; N/A     Comment:  Procedure: COLONOSCOPY WITH PROPOFOL;  Surgeon: Scot Jun, MD;  Location: [facility] ENDOSCOPY;  Service:               Endoscopy;  Laterality: N/A; No date: ESOPHAGOGASTRODUODENOSCOPY 08/27/2015: ESOPHAGOGASTRODUODENOSCOPY (EGD) WITH PROPOFOL; N/A     Comment:  Procedure: ESOPHAGOGASTRODUODENOSCOPY (EGD) WITH               PROPOFOL;  Surgeon: Scot Jun, MD;  Location: [facility]              ENDOSCOPY;  Service: Endoscopy;  Laterality: N/A; No date: EYE SURGERY No date: HERNIA REPAIR     Reproductive/Obstetrics negative OB ROS                             Anesthesia Physical Anesthesia Plan  ASA: 3  Anesthesia Plan: General   Post-op Pain Management:    Induction: Intravenous  PONV Risk Score and Plan: Propofol infusion and TIVA  Airway Management Planned: Natural Airway and Nasal Cannula  Additional Equipment:   Intra-op Plan:   Post-operative Plan:   Informed Consent: I have reviewed the patients History and Physical, chart, labs and discussed the procedure including the risks, benefits and alternatives  for the proposed anesthesia with the patient or authorized representative who has indicated his/her understanding and acceptance.     Dental Advisory Given  Plan Discussed with: Anesthesiologist, CRNA and Surgeon  Anesthesia Plan Comments: (Patient consented for risks of anesthesia including but not limited to:  - adverse reactions to medications - risk of airway placement if required - damage to eyes, teeth, lips or other oral mucosa - nerve damage due to positioning  - sore throat or hoarseness - Damage to heart, brain, nerves, lungs, other parts of body or loss of life  Patient voiced understanding.)       Anesthesia Quick Evaluation

## 2022-10-05 NOTE — Interval H&P Note (Signed)
History and Physical Interval Note:  10/05/2022 10:44 AM  Kim Ballard  has presented today for surgery, with the diagnosis of Gastroesophageal Reflux Disease Positive colorectal cancer screening using Cologuard test (R19.5).  The various methods of treatment have been discussed with the patient and family. After consideration of risks, benefits and other options for treatment, the patient has consented to  Procedure(s): COLONOSCOPY (N/A) ESOPHAGOGASTRODUODENOSCOPY (EGD) (N/A) as a surgical intervention.  The patient's history has been reviewed, patient examined, no change in status, stable for surgery.  I have reviewed the patient's chart and labs.  Questions were answered to the patient's satisfaction.     [place], [place]

## 2022-10-06 ENCOUNTER — Encounter: Payer: Self-pay | Admitting: Internal Medicine

## 2022-10-06 LAB — SURGICAL PATHOLOGY

## 2022-10-10 NOTE — Progress Notes (Unsigned)
Referring Physician:  Linus Salmons, MD [address] [place],  [place] [address]  Primary Physician:  Barbette Reichmann, MD  History of Present Illness: 10/10/2022 Ms. Kim Ballard is here today with a chief complaint of neck and left shoulder pain.  She has been having pain for approximate 1 month.  Lifting things makes it worse.  Laying down ice and heat make it better.  She has no numbness or tingling.  Bowel/Bladder Dysfunction: none  Conservative measures:  Physical therapy: denies has not participated in? Multimodal medical therapy including regular antiinflammatories:  robaxin Injections: denies has not received epidural steroid injections  Past Surgery: denies  [name] has no symptoms of cervical myelopathy.  The symptoms are causing a significant impact on the patient's life.   I have utilized the care everywhere function in epic to review the outside records available from external health systems.  Review of Systems:  A 10 point review of systems is negative, except for the pertinent positives and negatives detailed in the HPI.  Past Medical History: Past Medical History:  Diagnosis Date   Allergic state    Anemia    Arthritis    Cancer (HCC)    skin cancers   Chronic cystitis with hematuria    Depression    Diabetes mellitus without complication (HCC)    Fibrocystic disease of both breasts    Fibromyalgia    GERD (gastroesophageal reflux disease)    Hypertension    IBS (irritable bowel syndrome)    Melanoma (HCC) 07/09/2021   lt neck/shoulder area   Melanoma of neck (HCC) 09/06/2021   left   Nausea    Skin cancer    Wears dentures    full upper and lower    Past Surgical History: Past Surgical History:  Procedure Laterality Date   ABDOMINAL HYSTERECTOMY     APPENDECTOMY     BREAST BIOPSY Right 2001   surgical bx    CATARACT EXTRACTION W/PHACO Right 03/08/2017   Procedure: CATARACT EXTRACTION PHACO AND  INTRAOCULAR LENS PLACEMENT (IOC) RIGHT DIABETIC TORIC;  Surgeon: Lockie Mola, MD;  Location: [facility];  Service: Ophthalmology;  Laterality: Right;  Diabetic - oral meds   CATARACT EXTRACTION W/PHACO Left 04/17/2017   Procedure: CATARACT EXTRACTION PHACO AND INTRAOCULAR LENS PLACEMENT (IOC) LEFT DIABETIC;  Surgeon: Lockie Mola, MD;  Location: [facility];  Service: Ophthalmology;  Laterality: Left;  diabetic-oral meds   CHOLECYSTECTOMY     COLONOSCOPY     COLONOSCOPY N/A 10/05/2022   Procedure: COLONOSCOPY;  Surgeon: Toledo, Boykin Nearing, MD;  Location: [facility] ENDOSCOPY;  Service: Gastroenterology;  Laterality: N/A;   COLONOSCOPY WITH PROPOFOL N/A 08/27/2015   Procedure: COLONOSCOPY WITH PROPOFOL;  Surgeon: Scot Jun, MD;  Location: [facility] ENDOSCOPY;  Service: Endoscopy;  Laterality: N/A;   COLONOSCOPY WITH PROPOFOL N/A 05/02/2016   Procedure: COLONOSCOPY WITH PROPOFOL;  Surgeon: Scot Jun, MD;  Location: [facility] ENDOSCOPY;  Service: Endoscopy;  Laterality: N/A;   ESOPHAGOGASTRODUODENOSCOPY     ESOPHAGOGASTRODUODENOSCOPY N/A 10/05/2022   Procedure: ESOPHAGOGASTRODUODENOSCOPY (EGD);  Surgeon: Toledo, Boykin Nearing, MD;  Location: [facility] ENDOSCOPY;  Service: Gastroenterology;  Laterality: N/A;   ESOPHAGOGASTRODUODENOSCOPY (EGD) WITH PROPOFOL N/A 08/27/2015   Procedure: ESOPHAGOGASTRODUODENOSCOPY (EGD) WITH PROPOFOL;  Surgeon: Scot Jun, MD;  Location: [facility] ENDOSCOPY;  Service: Endoscopy;  Laterality: N/A;   EYE SURGERY     HERNIA REPAIR      Allergies: Allergies as of 10/11/2022 - Review Complete 10/05/2022  Allergen  Reaction Noted   Biaxin [clarithromycin] Other (See Comments) 08/26/2015   Brompheniramine maleate  08/26/2015   Ceclor [cefaclor]  08/26/2015   Ciprofloxacin  08/26/2015   Clinoril [sulindac]  08/26/2015   Codeine sulfate  08/26/2015   Diovan [valsartan]  08/26/2015   Doxycycline  08/26/2015   Erythromycin  08/26/2015   Esomeprazole   08/26/2015   Fluoxetine Other (See Comments) 04/27/2016   Guaifenesin & derivatives  08/26/2015   Levaquin [levofloxacin in d5w]  08/26/2015   Lisinopril  08/26/2015   Lodine [etodolac]  08/26/2015   Macrodantin [nitrofurantoin macrocrystal]  08/26/2015   Medrol [methylprednisolone]  08/26/2015   Mobic [meloxicam]  08/26/2015   Motrin [ibuprofen]  08/26/2015   Nexium [esomeprazole magnesium]  08/26/2015   Nitrofurantoin Other (See Comments) 10/25/2013   Norvasc [amlodipine besylate]  08/26/2015   Omnicef [cefdinir] Other (See Comments) 08/26/2015   Penicillins  08/26/2015   Prozac [fluoxetine hcl] Other (See Comments) 08/26/2015   Pseudoephedrine  08/26/2015   Reglan [metoclopramide]  08/26/2015   Seldane [terfenadine]  08/26/2015   Sulfa antibiotics Other (See Comments) 04/27/2016   Toprol xl [metoprolol tartrate]  08/26/2015   Triamterene  08/26/2015   Tussionex pennkinetic er [hydrocod poli-chlorphe poli er]  08/26/2015   Vibramycin [doxycycline calcium]  08/26/2015   Amlodipine Rash 05/14/2015    Medications:  Current Outpatient Medications:    Ascorbic Acid (VITAMIN C) 1000 MG tablet, Take 1,000 mg by mouth 2 (two) times daily., Disp: , Rfl:    cholecalciferol (VITAMIN D3) 25 MCG (1000 UT) tablet, Take 1,000 Units by mouth 2 (two) times daily., Disp: , Rfl:    cyanocobalamin (VITAMIN B12) 1000 MCG tablet, Take 1,000 mcg by mouth daily., Disp: , Rfl:    fluticasone (FLONASE) 50 MCG/ACT nasal spray, Place 2 sprays into both nostrils daily as needed for allergies., Disp: , Rfl:    gemfibrozil (LOPID) 600 MG tablet, Take 600 mg by mouth 2 (two) times daily before a meal., Disp: , Rfl:    glipiZIDE (GLUCOTROL XL) 5 MG 24 hr tablet, Take 10 mg by mouth in the morning and at bedtime., Disp: , Rfl:    hydrochlorothiazide (HYDRODIURIL) 25 MG tablet, Take 25 mg by mouth daily., Disp: , Rfl:    iron polysaccharides (NIFEREX) 150 MG capsule, Take 150 mg by mouth daily., Disp: , Rfl:     metFORMIN (GLUCOPHAGE-XR) 500 MG 24 hr tablet, Take 1,000 mg by mouth 2 (two) times daily., Disp: , Rfl:    methocarbamol (ROBAXIN) 500 MG tablet, Take 1 tablet (500 mg total) by mouth every 8 (eight) hours as needed for muscle spasms., Disp: 30 tablet, Rfl: 0   MIRABEGRON ER PO, Take 1 tablet by mouth daily., Disp: , Rfl:    Multiple Vitamins-Minerals (MULTIVITAMIN WITH MINERALS) tablet, Take 1 tablet by mouth daily., Disp: , Rfl:    Omega 3 1000 MG CAPS, Take 1,000 mg by mouth daily., Disp: , Rfl:    pantoprazole (PROTONIX) 40 MG tablet, Take 40 mg by mouth daily., Disp: , Rfl:    vitamin E 180 MG (400 UNITS) capsule, Take 400 Units by mouth daily., Disp: , Rfl:   Social History: Social History   Tobacco Use   Smoking status: Former    Types: Cigarettes    Quit date: 1960    Years since quitting: 64.3   Smokeless tobacco: Never  Vaping Use   Vaping Use: Never used  Substance Use Topics   Alcohol use: No   Drug use: No  Family Medical History: Family History  Problem Relation Age of Onset   Hypertension Mother    Ovarian cancer Paternal Grandmother    COPD Father    Hyperlipidemia Father    Kidney disease Father    Diabetes Maternal Grandmother    Breast cancer Neg Hx     Physical Examination: There were no vitals filed for this visit.  General: Patient is in no apparent distress. Attention to examination is appropriate.  Neck:   Supple.  Full range of motion.  Respiratory: Patient is breathing without any difficulty.   NEUROLOGICAL:     Awake, alert, oriented to person, place, and time.  Speech is clear and fluent.   Cranial Nerves: Pupils equal round and reactive to light.  Facial tone is symmetric.  Facial sensation is symmetric. Shoulder shrug is symmetric. Tongue protrusion is midline.  There is no pronator drift.  Strength: Side Biceps Triceps Deltoid Interossei Grip Wrist Ext. Wrist Flex.  R 5 5 5 5 5 5 5   L 5 5 5 5 5 5 5    Side Iliopsoas Quads  Hamstring PF DF EHL  R 5 5 5 5 5 5   L 5 5 5 5 5 5    Reflexes are 1+ and symmetric at the biceps, triceps, brachioradialis, patella and achilles.   Hoffman's is absent.   Bilateral upper and lower extremity sensation is intact to light touch.    No evidence of dysmetria noted.  Gait is normal.   She has tenderness to palpation of the left anterior portion of her shoulder.  She has discomfort with active range of motion.  Medical Decision Making  Imaging: MR C spine 09/16/2022 IMPRESSION: 1. Moderately large left paracentral disc extrusion at C3-4 with moderate spinal stenosis. 2. Mild spinal stenosis and mild-to-moderate neural foraminal stenosis at C5-6. 3. Advanced upper cervical facet arthrosis.     Electronically Signed   By: Sebastian Ache M.D.   On: 09/20/2022 16:12  I have personally reviewed the images and agree with the above interpretation.  Assessment and Plan: Ms. Whittingham is a pleasant 88 y.o. female with left shoulder pain as well as possible left lumbar radiculopathy.  She has known lumbar stenosis from her MRI scan in 2022.  I think her symptoms in her upper extremities are most likely secondary to shoulder issues.  I think she should start physical therapy.  She is seeing Dr. Yves Dill on Thursday, where I think it is reasonable to discuss left shoulder injection and possible lumbar spine injections.  I will be happy to see her back if she does not improve with physical therapy and injections of her low back.  I do not think her cervical spine findings are currently symptomatic.  I spent a total of 30 minutes in this patient's care today. This time was spent reviewing pertinent records including imaging studies, obtaining and confirming history, performing a directed evaluation, formulating and discussing my recommendations, and documenting the visit within the medical record.    Thank you for involving me in the care of this patient.      Zorawar Strollo K. Myer Haff  MD, [facility] Neurosurgery

## 2022-10-11 ENCOUNTER — Encounter: Payer: Self-pay | Admitting: Neurosurgery

## 2022-10-11 ENCOUNTER — Ambulatory Visit: Payer: Medicare HMO | Admitting: Neurosurgery

## 2022-10-11 VITALS — BP 132/71 | Ht 67.0 in | Wt 149.0 lb

## 2022-10-11 DIAGNOSIS — M5416 Radiculopathy, lumbar region: Secondary | ICD-10-CM | POA: Diagnosis not present

## 2022-10-11 DIAGNOSIS — M25512 Pain in left shoulder: Secondary | ICD-10-CM | POA: Diagnosis not present

## 2022-10-11 DIAGNOSIS — M48061 Spinal stenosis, lumbar region without neurogenic claudication: Secondary | ICD-10-CM

## 2022-10-11 DIAGNOSIS — G8929 Other chronic pain: Secondary | ICD-10-CM

## 2022-10-11 MED ORDER — GABAPENTIN 400 MG PO CAPS
400.0000 mg | ORAL_CAPSULE | Freq: Three times a day (TID) | ORAL | 0 refills | Status: DC
Start: 2022-10-11 — End: 2022-10-11

## 2022-10-12 ENCOUNTER — Encounter: Payer: Self-pay | Admitting: Internal Medicine

## 2022-10-14 ENCOUNTER — Encounter: Payer: Self-pay | Admitting: *Deleted

## 2022-10-27 ENCOUNTER — Other Ambulatory Visit: Payer: Self-pay | Admitting: Radiology

## 2022-10-27 DIAGNOSIS — Z01812 Encounter for preprocedural laboratory examination: Secondary | ICD-10-CM

## 2022-10-27 NOTE — Progress Notes (Signed)
Patient for IR Bone Marrow Biopsy on Friday 10/28/2022, I called and spoke with the patient on the phone and gave pre-procedure instructions. Pt was made aware to be here at 7:30a, NPO after MN prior to procedure as well as driver post procedure/recovery/discharge. Pt stated understanding.  Called 10/27/2022

## 2022-10-28 ENCOUNTER — Ambulatory Visit
Admission: RE | Admit: 2022-10-28 | Discharge: 2022-10-28 | Disposition: A | Discharge status: Home / Self Care | Payer: Medicare HMO | Source: Ambulatory Visit | Attending: Internal Medicine | Admitting: Internal Medicine

## 2022-10-28 DIAGNOSIS — Z87891 Personal history of nicotine dependence: Secondary | ICD-10-CM | POA: Insufficient documentation

## 2022-10-28 DIAGNOSIS — Z8041 Family history of malignant neoplasm of ovary: Secondary | ICD-10-CM | POA: Insufficient documentation

## 2022-10-28 DIAGNOSIS — D649 Anemia, unspecified: Secondary | ICD-10-CM | POA: Diagnosis present

## 2022-10-28 DIAGNOSIS — D696 Thrombocytopenia, unspecified: Secondary | ICD-10-CM | POA: Diagnosis not present

## 2022-10-28 DIAGNOSIS — Z01812 Encounter for preprocedural laboratory examination: Secondary | ICD-10-CM

## 2022-10-28 DIAGNOSIS — Z85828 Personal history of other malignant neoplasm of skin: Secondary | ICD-10-CM | POA: Diagnosis not present

## 2022-10-28 DIAGNOSIS — T8859XA Other complications of anesthesia, initial encounter: Secondary | ICD-10-CM

## 2022-10-28 HISTORY — PX: IR BONE MARROW BIOPSY & ASPIRATION: IMG5727

## 2022-10-28 HISTORY — DX: Other complications of anesthesia, initial encounter: T88.59XA

## 2022-10-28 LAB — CBC WITH DIFFERENTIAL/PLATELET
Abs Immature Granulocytes: 0.04 10*3/uL (ref 0.00–0.07)
Basophils Absolute: 0 10*3/uL (ref 0.0–0.1)
Basophils Relative: 0 %
Eosinophils Absolute: 0.2 10*3/uL (ref 0.0–0.5)
Eosinophils Relative: 3 %
HCT: 31.8 % — ABNORMAL LOW (ref 36.0–46.0)
Hemoglobin: 10.5 g/dL — ABNORMAL LOW (ref 12.0–15.0)
Immature Granulocytes: 1 %
Lymphocytes Relative: 20 %
Lymphs Abs: 1.1 10*3/uL (ref 0.7–4.0)
MCH: 31.6 pg (ref 26.0–34.0)
MCHC: 33 g/dL (ref 30.0–36.0)
MCV: 95.8 fL (ref 80.0–100.0)
Monocytes Absolute: 0.5 10*3/uL (ref 0.1–1.0)
Monocytes Relative: 9 %
Neutro Abs: 3.6 10*3/uL (ref 1.7–7.7)
Neutrophils Relative %: 67 %
Platelets: 133 10*3/uL — ABNORMAL LOW (ref 150–400)
RBC: 3.32 MIL/uL — ABNORMAL LOW (ref 3.87–5.11)
RDW: 13.1 % (ref 11.5–15.5)
WBC: 5.4 10*3/uL (ref 4.0–10.5)
nRBC: 0 % (ref 0.0–0.2)

## 2022-10-28 LAB — GLUCOSE, CAPILLARY
Glucose-Capillary: 113 mg/dL — ABNORMAL HIGH (ref 70–99)
Glucose-Capillary: 100 mg/dL — ABNORMAL HIGH (ref 70–99)

## 2022-10-28 MED ORDER — FENTANYL CITRATE (PF) 100 MCG/2ML IJ SOLN
INTRAMUSCULAR | Status: AC
  Filled 2022-10-28: qty 4

## 2022-10-28 MED ORDER — LIDOCAINE HCL 1 % IJ SOLN
6.0000 mL | Freq: Once | INTRAMUSCULAR | Status: AC
  Administered 2022-10-28: 6 mL

## 2022-10-28 MED ORDER — FENTANYL CITRATE (PF) 100 MCG/2ML IJ SOLN
INTRAMUSCULAR | Status: AC | PRN
  Administered 2022-10-28: 50 ug via INTRAVENOUS

## 2022-10-28 MED ORDER — MIDAZOLAM HCL 2 MG/2ML IJ SOLN
INTRAMUSCULAR | Status: AC
  Filled 2022-10-28: qty 4

## 2022-10-28 MED ORDER — HEPARIN SOD (PORK) LOCK FLUSH 100 UNIT/ML IV SOLN
INTRAVENOUS | Status: AC
  Filled 2022-10-28: qty 5

## 2022-10-28 MED ORDER — MIDAZOLAM HCL 2 MG/2ML IJ SOLN
INTRAMUSCULAR | Status: AC | PRN
  Administered 2022-10-28: 1 mg via INTRAVENOUS

## 2022-10-28 MED ORDER — HEPARIN SODIUM (PORCINE) 1000 UNIT/ML IJ SOLN
INTRAMUSCULAR | Status: AC
  Filled 2022-10-28: qty 10

## 2022-10-28 MED ORDER — LORAZEPAM 2 MG/ML IJ SOLN: INTRAMUSCULAR | Status: AC | PRN

## 2022-10-28 MED ORDER — SODIUM CHLORIDE 0.9 % IV SOLN: INTRAVENOUS | Status: DC

## 2022-10-28 NOTE — H&P (Signed)
Chief Complaint: Patient was seen in consultation today for anemia and thrombocytopenia  Referring Physician(s): Earna Coder  Supervising Physician: Pernell Dupre  Patient Status: [facility] - Out-pt  History of Present Illness: Kim Ballard is a 88 y.o. female with PMH significant for anemia, depression, diabetes mellitus, fibromyalgia, and hypertension being seen today in relation to worsening anemia and thrombocytopenia. Patient is followed by Dr Donneta Romberg from Hematology/Oncology service. He has recommended image-guided bone marrow biopsy to further evaluate her condition.  Past Medical History:  Diagnosis Date   Allergic state    Anemia    Arthritis    Cancer (HCC)    skin cancers   Chronic cystitis with hematuria    Depression    Diabetes mellitus without complication (HCC)    Fibrocystic disease of both breasts    Fibromyalgia    GERD (gastroesophageal reflux disease)    Hypertension    IBS (irritable bowel syndrome)    Melanoma (HCC) 07/09/2021   lt neck/shoulder area   Melanoma of neck (HCC) 09/06/2021   left   Nausea    Skin cancer    Wears dentures    full upper and lower    Past Surgical History:  Procedure Laterality Date   ABDOMINAL HYSTERECTOMY     APPENDECTOMY     BREAST BIOPSY Right 2001   surgical bx    CATARACT EXTRACTION W/PHACO Right 03/08/2017   Procedure: CATARACT EXTRACTION PHACO AND INTRAOCULAR LENS PLACEMENT (IOC) RIGHT DIABETIC TORIC;  Surgeon: Lockie Mola, MD;  Location: [facility];  Service: Ophthalmology;  Laterality: Right;  Diabetic - oral meds   CATARACT EXTRACTION W/PHACO Left 04/17/2017   Procedure: CATARACT EXTRACTION PHACO AND INTRAOCULAR LENS PLACEMENT (IOC) LEFT DIABETIC;  Surgeon: Lockie Mola, MD;  Location: [facility];  Service: Ophthalmology;  Laterality: Left;  diabetic-oral meds   CHOLECYSTECTOMY     COLONOSCOPY     COLONOSCOPY N/A 10/05/2022   Procedure: COLONOSCOPY;   Surgeon: Toledo, Boykin Nearing, MD;  Location: [facility] ENDOSCOPY;  Service: Gastroenterology;  Laterality: N/A;   COLONOSCOPY WITH PROPOFOL N/A 08/27/2015   Procedure: COLONOSCOPY WITH PROPOFOL;  Surgeon: Scot Jun, MD;  Location: [facility] ENDOSCOPY;  Service: Endoscopy;  Laterality: N/A;   COLONOSCOPY WITH PROPOFOL N/A 05/02/2016   Procedure: COLONOSCOPY WITH PROPOFOL;  Surgeon: Scot Jun, MD;  Location: [facility] END[facility]COPY;  Service: Endoscopy;  Laterality: N/A;   ESOPHAGOGASTRODUODENOSCOPY     ESOPHAGOGASTRODUODENOSCOPY N/A 10/05/2022   Procedure: ESOPHAGOGASTRODUODENOSCOPY (EGD);  Surgeon: Toledo, Boykin Nearing, MD;  Location: [facility] ENDOSCOPY;  Service: Gastroenterology;  Laterality: N/A;   ESOPHAGOGASTRODUODENOSCOPY (EGD) WITH PROPOFOL N/A 08/27/2015   Procedure: ESOPHAGOGASTRODUODENOSCOPY (EGD) WITH PROPOFOL;  Surgeon: Scot Jun, MD;  Location: [facility] ENDOSCOPY;  Service: Endoscopy;  Laterality: N/A;   EYE SURGERY     HERNIA REPAIR      Allergies: Biaxin [clarithromycin], Brompheniramine maleate, Ceclor [cefaclor], Ciprofloxacin, Clinoril [sulindac], Codeine sulfate, Diovan [valsartan], Doxycycline, Erythromycin, Esomeprazole, Fluoxetine, Guaifenesin & derivatives, Levaquin [levofloxacin in d5w], Lisinopril, Lodine [etodolac], Macrodantin [nitrofurantoin macrocrystal], Medrol [methylprednisolone], Mobic [meloxicam], Motrin [ibuprofen], Nexium [esomeprazole magnesium], Nitrofurantoin, Norvasc [amlodipine besylate], Omnicef [cefdinir], Penicillins, Prozac [fluoxetine hcl], Pseudoephedrine, Reglan [metoclopramide], Seldane [terfenadine], Sulfa antibiotics, Toprol xl [metoprolol tartrate], Triamterene, Tussionex pennkinetic er [hydrocod poli-chlorphe poli er], Vibramycin [doxycycline calcium], and Amlodipine  Medications: Prior to Admission medications   Medication Sig Start Date End Date Taking? Authorizing Provider  Ascorbic Acid (VITAMIN C) 1000 MG tablet Take 1,000 mg by mouth 2 (two) times  daily.    [provider]  cholecalciferol (VITAMIN D3) 25 MCG (1000 UT) tablet Take 1,000 Units by mouth 2 (two) times daily.    [provider]  cyanocobalamin (VITAMIN B12) 1000 MCG tablet Take 1,000 mcg by mouth daily.    [provider]  fluticasone (FLONASE) 50 MCG/ACT nasal spray Place 2 sprays into both nostrils daily as needed for allergies.    [provider]  gemfibrozil (LOPID) 600 MG tablet Take 600 mg by mouth 2 (two) times daily before a meal.    [provider]  glipiZIDE (GLUCOTROL XL) 5 MG 24 hr tablet Take 10 mg by mouth in the morning and at bedtime.    [provider]  hydrochlorothiazide (HYDRODIURIL) 25 MG tablet Take 25 mg by mouth daily.    [provider]  iron polysaccharides (NIFEREX) 150 MG capsule Take 150 mg by mouth daily.    [provider]  metFORMIN (GLUCOPHAGE-XR) 500 MG 24 hr tablet Take 1,000 mg by mouth 2 (two) times daily. 04/14/22   [provider]  methocarbamol (ROBAXIN) 500 MG tablet Take 1 tablet (500 mg total) by mouth every 8 (eight) hours as needed for muscle spasms. 05/24/22   Lewie Chamber, MD  MIRABEGRON ER PO Take 1 tablet by mouth daily.    [provider]  Multiple Vitamins-Minerals (MULTIVITAMIN WITH MINERALS) tablet Take 1 tablet by mouth daily.    [provider]  Omega 3 1000 MG CAPS Take 1,000 mg by mouth daily.    [provider]  pantoprazole (PROTONIX) 40 MG tablet Take 40 mg by mouth daily.    [provider]  vitamin E 180 MG (400 UNITS) capsule Take 400 Units by mouth daily.    [provider]     Family History  Problem Relation Age of Onset   Hypertension Mother    Ovarian cancer Paternal Grandmother    COPD Father    Hyperlipidemia Father    Kidney disease Father    Diabetes Maternal Grandmother    Breast cancer Neg Hx     Social History   Socioeconomic History   Marital status: Widowed     Spouse name: Not on file   Number of children: Not on file   Years of education: Not on file   Highest education level: Not on file  Occupational History   Not on file  Tobacco Use   Smoking status: Former    Types: Cigarettes    Quit date: 57    Years since quitting: 64.4   Smokeless tobacco: Never  Vaping Use   Vaping Use: Never used  Substance and Sexual Activity   Alcohol use: No   Drug use: No   Sexual activity: Not Currently  Other Topics Concern   Not on file  Social History Narrative   Not on file   Social Determinants of Health   Financial Resource Strain: Not on file  Food Insecurity: No Food Insecurity (05/24/2022)   Hunger Vital Sign    Worried About Running Out of Food in the Last Year: Never true    Ran Out of Food in the Last Year: Never true  Transportation Needs: No Transportation Needs (05/24/2022)   PRAPARE - Administrator, Civil Service (Medical): No    Lack of Transportation (Non-Medical): No  Physical Activity: Not on file  Stress: Not on file  Social Connections: Not on file    Code Status: Full Code  Review of Systems: A 12 point ROS  discussed and pertinent positives are indicated in the HPI above.  All other systems are negative.  Review of Systems  Constitutional:  Negative for chills and fever.  Respiratory:  Negative for chest tightness and shortness of breath.   Cardiovascular:  Negative for chest pain and leg swelling.  Gastrointestinal:  Negative for abdominal pain, diarrhea, nausea and vomiting.  Neurological:  Negative for dizziness and headaches.  Psychiatric/Behavioral:  Negative for confusion. The patient is nervous/anxious.     Vital Signs: BP (!) 203/91   Pulse 89   Resp 18   Ht 5\' 7"  (1.702 m)   Wt 144 lb (65.3 kg)   SpO2 96%   BMI 22.55 kg/m   Advance Care Plan: The advanced care plan/surrogate decision maker was discussed at the time of visit and documented in the medical record.    Physical  Exam Vitals reviewed.  Constitutional:      General: She is not in acute distress.    Appearance: She is not ill-appearing.  HENT:     Mouth/Throat:     Mouth: Mucous membranes are moist.  Cardiovascular:     Rate and Rhythm: Normal rate and regular rhythm.     Pulses: Normal pulses.     Heart sounds: Normal heart sounds.  Pulmonary:     Effort: Pulmonary effort is normal.     Breath sounds: Normal breath sounds.  Abdominal:     Palpations: Abdomen is soft.     Tenderness: There is no abdominal tenderness.  Musculoskeletal:     Right lower leg: No edema.     Left lower leg: No edema.  Skin:    General: Skin is warm and dry.  Neurological:     Mental Status: She is alert and oriented to person, place, and time.  Psychiatric:        Mood and Affect: Mood normal.        Behavior: Behavior normal.        Thought Content: Thought content normal.        Judgment: Judgment normal.     Imaging: No results found.  Labs:  CBC: Recent Labs    05/23/22 1242 05/24/22 0519 07/26/22 1257 09/23/22 1537  WBC 8.8 8.6 4.7 3.6*  HGB 10.9* 9.6* 10.0* 9.8*  HCT 33.0* 29.5* 30.7* 30.1*  PLT 147* 144* 141* 129*    COAGS: No results for input(s): "INR", "APTT" in the last 8760 hours.  BMP: Recent Labs    05/23/22 1242 05/24/22 0519 07/26/22 1257 09/23/22 1537  NA 138 137 140 140  K 3.4* 3.7 4.2 4.4  CL 101 105 106 106  CO2 25 26 23 23   GLUCOSE 180* 210* 212* 302*  BUN 29* 24* 32* 36*  CALCIUM 10.2 9.4 9.2 9.3  CREATININE 0.97 0.93 0.95 1.19*  GFRNONAA 57* >60 59* 45*    LIVER FUNCTION TESTS: Recent Labs    05/23/22 1242  BILITOT 0.8  AST 10*  ALT 11  ALKPHOS 90  PROT 7.6  ALBUMIN 4.0    TUMOR MARKERS: No results for input(s): "AFPTM", "CEA", "CA199", "CHROMGRNA" in the last 8760 hours.  Assessment and Plan:  Kyriah Huibregtse is an 88 yo female being seen today in relation to worsening anemia and thrombocytopenia. Patient is under the care of Dr Donneta Romberg  from Hematology/Oncology service who has requested image-guided bone marrow biopsy to further evaluate the patient. Case has been reviewed with Dr Juliette Alcide and is scheduled to proceed on 10/28/22. Patient presents today in  her usual state of health and is NPO. She reports she is very nervous regarding the procedure.  Risks and benefits of image-guided bone marrow biopsy was discussed with the patient and/or patient's family including, but not limited to bleeding, infection, damage to adjacent structures or low yield requiring additional tests.  All of the questions were answered and there is agreement to proceed.  Consent signed and in chart.   Thank you for this interesting consult.  I greatly enjoyed meeting Kim Ballard and look forward to participating in their care.  A copy of this report was sent to the requesting provider on this date.  Electronically Signed: Kennieth Francois, PA-C 10/28/2022, 8:04 AM   I spent a total of  15 Minutes   in face to face in clinical consultation, greater than 50% of which was counseling/coordinating care for anemia and thrombocytopenia.

## 2022-10-28 NOTE — Procedures (Signed)
Interventional Radiology Procedure Note  Date of Procedure: 10/28/2022  Procedure: CT BMBx   Findings:  1. CT BMBx    Complications: No immediate complications noted.   Estimated Blood Loss: minimal  Follow-up and Recommendations: 1. Bedrest 1 hour    Olive Bass, MD  Vascular & Interventional Radiology  10/28/2022 9:08 AM

## 2022-11-02 LAB — SURGICAL PATHOLOGY

## 2022-11-03 ENCOUNTER — Encounter: Payer: Self-pay | Admitting: Unknown Physician Specialty

## 2022-11-03 NOTE — Anesthesia Preprocedure Evaluation (Addendum)
Anesthesia Evaluation  Patient identified by MRN, date of birth, ID band Patient awake    Reviewed: Allergy & Precautions, H&P , NPO status , Patient's Chart, lab work & pertinent test results  History of Anesthesia Complications (+) history of anesthetic complications  Airway Mallampati: I  TM Distance: >3 FB Neck ROM: Full    Dental no notable dental hx. (+) Edentulous Upper, Edentulous Lower   Pulmonary neg pulmonary ROS, former smoker   Pulmonary exam normal breath sounds clear to auscultation       Cardiovascular hypertension, + Peripheral Vascular Disease  negative cardio ROS Normal cardiovascular exam Rhythm:Regular Rate:Normal     Neuro/Psych  PSYCHIATRIC DISORDERS  Depression     Neuromuscular disease negative neurological ROS  negative psych ROS   GI/Hepatic negative GI ROS, Neg liver ROS,GERD  ,,  Endo/Other  negative endocrine ROSdiabetes    Renal/GU Renal diseasenegative Renal ROS  negative genitourinary   Musculoskeletal negative musculoskeletal ROS (+) Arthritis ,  Fibromyalgia -  Abdominal   Peds negative pediatric ROS (+)  Hematology negative hematology ROS (+) Blood dyscrasia, anemia   Anesthesia Other Findings Depression  Diabetes mellitus without complication  Fibromyalgia  Fibrocystic disease of both breasts GERD (gastroesophageal reflux disease) Hypertension IBS (irritable bowel syndrome) Arthritis Anemia  Allergic state Chronic cystitis with hematuria Nausea Cancer (HCC) Skin cancer Wears dentures  Melanoma (HCC) Melanoma of neck (HCC) Complication of anesthesia, excessive night sweats  and itching after anesthesia    Reproductive/Obstetrics negative OB ROS                             Anesthesia Physical Anesthesia Plan  ASA: 3  Anesthesia Plan: General ETT   Post-op Pain Management:    Induction: Intravenous  PONV Risk Score and Plan:    Airway Management Planned: Oral ETT  Additional Equipment:   Intra-op Plan:   Post-operative Plan: Extubation in OR  Informed Consent: I have reviewed the patients History and Physical, chart, labs and discussed the procedure including the risks, benefits and alternatives for the proposed anesthesia with the patient or authorized representative who has indicated his/her understanding and acceptance.     Dental Advisory Given  Plan Discussed with: Anesthesiologist, CRNA and Surgeon  Anesthesia Plan Comments: (Patient consented for risks of anesthesia including but not limited to:  - adverse reactions to medications - damage to eyes, teeth, lips or other oral mucosa - nerve damage due to positioning  - sore throat or hoarseness - Damage to heart, brain, nerves, lungs, other parts of body or loss of life  Patient voiced understanding.)       Anesthesia Quick Evaluation

## 2022-11-04 ENCOUNTER — Inpatient Hospital Stay: Payer: Medicare HMO | Attending: Internal Medicine

## 2022-11-04 ENCOUNTER — Encounter: Payer: Self-pay | Admitting: Internal Medicine

## 2022-11-04 ENCOUNTER — Inpatient Hospital Stay: Payer: Medicare HMO

## 2022-11-04 ENCOUNTER — Inpatient Hospital Stay (HOSPITAL_BASED_OUTPATIENT_CLINIC_OR_DEPARTMENT_OTHER): Payer: Medicare HMO | Admitting: Internal Medicine

## 2022-11-04 VITALS — BP 183/71 | HR 75 | Temp 97.5°F | Resp 16

## 2022-11-04 VITALS — BP 190/78 | HR 78 | Temp 97.0°F | Resp 18 | Ht 67.0 in | Wt 147.4 lb

## 2022-11-04 DIAGNOSIS — N1831 Chronic kidney disease, stage 3a: Secondary | ICD-10-CM | POA: Diagnosis not present

## 2022-11-04 DIAGNOSIS — Z79899 Other long term (current) drug therapy: Secondary | ICD-10-CM | POA: Insufficient documentation

## 2022-11-04 DIAGNOSIS — D509 Iron deficiency anemia, unspecified: Secondary | ICD-10-CM | POA: Diagnosis present

## 2022-11-04 DIAGNOSIS — D631 Anemia in chronic kidney disease: Secondary | ICD-10-CM

## 2022-11-04 DIAGNOSIS — D649 Anemia, unspecified: Secondary | ICD-10-CM

## 2022-11-04 DIAGNOSIS — D696 Thrombocytopenia, unspecified: Secondary | ICD-10-CM | POA: Diagnosis not present

## 2022-11-04 DIAGNOSIS — D508 Other iron deficiency anemias: Secondary | ICD-10-CM

## 2022-11-04 LAB — BASIC METABOLIC PANEL - CANCER CENTER ONLY
Anion gap: 11 (ref 5–15)
BUN: 34 mg/dL — ABNORMAL HIGH (ref 8–23)
CO2: 22 mmol/L (ref 22–32)
Calcium: 9.2 mg/dL (ref 8.9–10.3)
Chloride: 109 mmol/L (ref 98–111)
Creatinine: 1.1 mg/dL — ABNORMAL HIGH (ref 0.44–1.00)
GFR, Estimated: 49 mL/min — ABNORMAL LOW (ref >60)
Glucose, Bld: 207 mg/dL — ABNORMAL HIGH (ref 70–99)
Potassium: 4.3 mmol/L (ref 3.5–5.1)
Sodium: 142 mmol/L (ref 135–145)

## 2022-11-04 LAB — CBC WITH DIFFERENTIAL (CANCER CENTER ONLY)
Abs Immature Granulocytes: 0.02 10*3/uL (ref 0.00–0.07)
Basophils Absolute: 0 10*3/uL (ref 0.0–0.1)
Basophils Relative: 1 %
Eosinophils Absolute: 0.2 10*3/uL (ref 0.0–0.5)
Eosinophils Relative: 4 %
HCT: 29.9 % — ABNORMAL LOW (ref 36.0–46.0)
Hemoglobin: 9.7 g/dL — ABNORMAL LOW (ref 12.0–15.0)
Immature Granulocytes: 1 %
Lymphocytes Relative: 18 %
Lymphs Abs: 0.8 10*3/uL (ref 0.7–4.0)
MCH: 31.2 pg (ref 26.0–34.0)
MCHC: 32.4 g/dL (ref 30.0–36.0)
MCV: 96.1 fL (ref 80.0–100.0)
Monocytes Absolute: 0.4 10*3/uL (ref 0.1–1.0)
Monocytes Relative: 8 %
Neutro Abs: 2.9 10*3/uL (ref 1.7–7.7)
Neutrophils Relative %: 68 %
Platelet Count: 118 10*3/uL — ABNORMAL LOW (ref 150–400)
RBC: 3.11 MIL/uL — ABNORMAL LOW (ref 3.87–5.11)
RDW: 13 % (ref 11.5–15.5)
WBC Count: 4.3 10*3/uL (ref 4.0–10.5)
nRBC: 0 % (ref 0.0–0.2)

## 2022-11-04 LAB — IRON AND TIBC
Iron: 53 ug/dL (ref 28–170)
Saturation Ratios: 13 % (ref 10.4–31.8)
TIBC: 426 ug/dL (ref 250–450)
UIBC: 373 ug/dL

## 2022-11-04 LAB — LACTATE DEHYDROGENASE: LDH: 160 U/L (ref 98–192)

## 2022-11-04 LAB — FERRITIN: Ferritin: 106 ng/mL (ref 11–307)

## 2022-11-04 MED ORDER — SODIUM CHLORIDE 0.9 % IV SOLN
200.0000 mg | Freq: Once | INTRAVENOUS | Status: AC
  Administered 2022-11-04: 200 mg via INTRAVENOUS
  Filled 2022-11-04: qty 200

## 2022-11-04 MED ORDER — SODIUM CHLORIDE 0.9 % IV SOLN
Freq: Once | INTRAVENOUS | Status: AC
  Filled 2022-11-04: qty 250

## 2022-11-04 NOTE — Assessment & Plan Note (Addendum)
#  Chronic mild anemia-hemoglobin  10-11; MAY 2024- Iron sat-15% ; ferritin- 169  Not totally consistent with iron deficiency- ? CKD stage III-  # MAY 2024-bone marrow biopsy-not suggestive of myelodysplastic syndrome.  Cytogenetics/NGS pending. MAY 2024-S/p EGD/coloscopy [Dr.Toledo];   # Today hemoglobin is 9.8 -recommend proceeding with iron infusion today and again in 2 weeks.  Discussthe possible use of Retacrit at subsequent visits if the hemoglobin is less than 10.  # ITP/ Thrombocytopenia-platelet count- > 100 -stable. on surveillance.  # Elevated HTN- keep a log BPs; Repeat 170/90. And defer to PCP, Dr.Hande.  stable.   # CKD stage III- monitor closely with BP. Hydration.   # DISPOSITION: # venofer today # venofer in 2 weeks # follow up in 2 months  MD: labs- cbc/bmp; LDH---possible Venofer OR Retacrit- -Dr.B

## 2022-11-04 NOTE — Progress Notes (Signed)
[facility] CONSULT NOTE  Patient Care Team: Barbette Reichmann, MD as PCP - General (Internal Medicine) Barbette Reichmann, MD as Physician Assistant (Internal Medicine) Rosey Bath, MD (Inactive) as Consulting Physician (Hematology and Oncology) Earna Coder, MD as Consulting Physician (Oncology)  CHIEF COMPLAINTS/PURPOSE OF CONSULTATION: Iron deficiency anemia/thrombocytopenia  #Chronic [2015] mild anemia hemoglobin 11.1-question iron deficiency versus others.-Dr. Elliott/Dr. Merlene Pulling; s/p Venofer. MAY 2024- BONE MARROW, ASPIRATE, CLOT, CORE:  - Normocellular bone marrow (20%) with trilineage hematopoiesis and no  increase in blasts.  Morphologic evaluation and immunohistochemical analysis do not provide a  precise explanation for the patient's anemia and thrombocytopenia. The  absence of significant morphologic dysplasia makes a myelodysplastic  syndrome less likely and secondary causes should be considered; however,  correlation with pending cytogenetic and molecular studies will be of  interest to exclude a morphologically subtle evolving myeloid neoplasm.   #Mild thrombocytopenia platelets 120s  #Fatigue/ PN/DM; MELANOMA of mid back [May 2021] s/p excision. MELANOMA LEFT neck posterior-s/p excision.[JAN 2023 ]  Oncology History   No history exists.    HISTORY OF PRESENTING ILLNESS: Alone.  Ambulating independently.  Kim Ballard 88 y.o.  female longstanding history of iron deficient anemia/thrombocytopenia is here for follow-up/ review her Bone Marrow Biopsy results.    Appetite is fair; eats 1 meal per day. Pt feels very run down. Had colonoscopy/ EGD last week-no significant source of bleeding noted.  Having surgery next Friday with Dr Jenne Campus for incidental finding noted on EGD.   Patient states she fatigued all the time   No fever no chills.  No nausea or vomiting.  No blood in stools or black or stools.  Review of Systems   Constitutional:  Positive for malaise/fatigue. Negative for chills, diaphoresis, fever and weight loss.  HENT:  Negative for nosebleeds and sore throat.   Eyes:  Negative for double vision.  Respiratory:  Negative for cough, hemoptysis, sputum production, shortness of breath and wheezing.   Cardiovascular:  Negative for chest pain, palpitations, orthopnea and leg swelling.  Gastrointestinal:  Negative for blood in stool, constipation, diarrhea, heartburn, melena, nausea and vomiting.  Musculoskeletal:  Negative for back pain and joint pain.  Skin: Negative.  Negative for itching and rash.  Neurological:  Positive for tingling. Negative for dizziness, focal weakness, weakness and headaches.  Endo/Heme/Allergies:  Does not bruise/bleed easily.  Psychiatric/Behavioral:  Negative for depression. The patient is not nervous/anxious and does not have insomnia.      MEDICAL HISTORY:  Past Medical History:  Diagnosis Date   Allergic state    Anemia    Arthritis    Cancer (HCC)    skin cancers   Chronic cystitis with hematuria    Complication of anesthesia 10/28/2022   Excessive night sweats and itching after procedure 10/28/22   Depression    Diabetes mellitus without complication (HCC)    Fibrocystic disease of both breasts    Fibromyalgia    GERD (gastroesophageal reflux disease)    Hypertension    IBS (irritable bowel syndrome)    Melanoma (HCC) 07/09/2021   lt neck/shoulder area   Melanoma of neck (HCC) 09/06/2021   left   Nausea    Skin cancer    Wears dentures    full upper and lower    SURGICAL HISTORY: Past Surgical History:  Procedure Laterality Date   ABDOMINAL HYSTERECTOMY     APPENDECTOMY     BREAST BIOPSY Right 2001   surgical bx    CATARACT  EXTRACTION W/PHACO Right 03/08/2017   Procedure: CATARACT EXTRACTION PHACO AND INTRAOCULAR LENS PLACEMENT (IOC) RIGHT DIABETIC TORIC;  Surgeon: Lockie Mola, MD;  Location: [facility];  Service:  Ophthalmology;  Laterality: Right;  Diabetic - oral meds   CATARACT EXTRACTION W/PHACO Left 04/17/2017   Procedure: CATARACT EXTRACTION PHACO AND INTRAOCULAR LENS PLACEMENT (IOC) LEFT DIABETIC;  Surgeon: Lockie Mola, MD;  Location: [facility];  Service: Ophthalmology;  Laterality: Left;  diabetic-oral meds   CHOLECYSTECTOMY     COLONOSCOPY     COLONOSCOPY N/A 10/05/2022   Procedure: COLONOSCOPY;  Surgeon: Toledo, Boykin Nearing, MD;  Location: [facility] END[facility];  Service: Gastroenterology;  Laterality: N/A;   COLONOSCOPY WITH PROPOFOL N/A 08/27/2015   Procedure: COLONOSCOPY WITH PROPOFOL;  Surgeon: Scot Jun, MD;  Location: [facility];  Service: Endoscopy;  Laterality: N/A;   COLONOSCOPY WITH PROPOFOL N/A 05/02/2016   Procedure: COLONOSCOPY WITH PROPOFOL;  Surgeon: Scot Jun, MD;  Location: [facility] END[facility]COPY;  Service: Endoscopy;  Laterality: N/A;   ESOPHAGOGASTRODUODENOSCOPY     ESOPHAGOGASTRODUODENOSCOPY N/A 10/05/2022   Procedure: ESOPHAGOGASTRODUODENOSCOPY (EGD);  Surgeon: Toledo, Boykin Nearing, MD;  Location: [facility] END[facility]COPY;  Service: Gastroenterology;  Laterality: N/A;   ESOPHAGOGASTRODUODENOSCOPY (EGD) WITH PROPOFOL N/A 08/27/2015   Procedure: ESOPHAGOGASTRODUODENOSCOPY (EGD) WITH PROPOFOL;  Surgeon: Scot Jun, MD;  Location: [facility] END[facility]COPY;  Service: Endoscopy;  Laterality: N/A;   EYE SURGERY     HERNIA REPAIR     IR BONE MARROW BIOPSY & ASPIRATION  10/28/2022    SOCIAL HISTORY: Social History   Socioeconomic History   Marital status: Widowed    Spouse name: Not on file   Number of children: Not on file   Years of education: Not on file   Highest education level: Not on file  Occupational History   Not on file  Tobacco Use   Smoking status: Former    Types: Cigarettes    Quit date: 67    Years since quitting: 64.4   Smokeless tobacco: Never  Vaping Use   Vaping Use: Never used  Substance and Sexual Activity   Alcohol use: No   Drug use: No    Sexual activity: Not Currently  Other Topics Concern   Not on file  Social History Narrative   Not on file   Social Determinants of Health   Financial Resource Strain: Not on file  Food Insecurity: No Food Insecurity (05/24/2022)   Hunger Vital Sign    Worried About Running Out of Food in the Last Year: Never true    Ran Out of Food in the Last Year: Never true  Transportation Needs: No Transportation Needs (05/24/2022)   PRAPARE - Administrator, Civil Service (Medical): No    Lack of Transportation (Non-Medical): No  Physical Activity: Not on file  Stress: Not on file  Social Connections: Not on file  Intimate Partner Violence: Not At Risk (05/24/2022)   Humiliation, Afraid, Rape, and Kick questionnaire    Fear of Current or Ex-Partner: No    Emotionally Abused: No    Physically Abused: No    Sexually Abused: No    FAMILY HISTORY: Family History  Problem Relation Age of Onset   Hypertension Mother    Ovarian cancer Paternal Grandmother    COPD Father    Hyperlipidemia Father    Kidney disease Father    Diabetes Maternal Grandmother    Breast cancer Neg Hx     ALLERGIES:  is allergic to biaxin [clarithromycin], brompheniramine maleate,  ceclor [cefaclor], ciprofloxacin, clinoril [sulindac], codeine sulfate, diovan [valsartan], doxycycline, erythromycin, esomeprazole, fluoxetine, guaifenesin & derivatives, levaquin [levofloxacin in d5w], lisinopril, lodine [etodolac], macrodantin [nitrofurantoin macrocrystal], medrol [methylprednisolone], mobic [meloxicam], motrin [ibuprofen], nexium [esomeprazole magnesium], nitrofurantoin, norvasc [amlodipine besylate], omnicef [cefdinir], penicillins, prednisone, prozac [fluoxetine hcl], pseudoephedrine, reglan [metoclopramide], seldane [terfenadine], sulfa antibiotics, toprol xl [metoprolol tartrate], triamterene, tussionex pennkinetic er [hydrocod poli-chlorphe poli er], vibramycin [doxycycline calcium], and  amlodipine.  MEDICATIONS:  Current Outpatient Medications  Medication Sig Dispense Refill   Ascorbic Acid (VITAMIN C) 1000 MG tablet Take 1,000 mg by mouth 2 (two) times daily.     cholecalciferol (VITAMIN D3) 25 MCG (1000 UT) tablet Take 1,000 Units by mouth 2 (two) times daily.     cyanocobalamin (VITAMIN B12) 1000 MCG tablet Take 1,000 mcg by mouth daily.     fluticasone (FLONASE) 50 MCG/ACT nasal spray Place 2 sprays into both nostrils daily as needed for allergies.     gemfibrozil (LOPID) 600 MG tablet Take 600 mg by mouth 2 (two) times daily before a meal.     glipiZIDE (GLUCOTROL XL) 5 MG 24 hr tablet Take 10 mg by mouth in the morning and at bedtime.     hydrochlorothiazide (HYDRODIURIL) 25 MG tablet Take 25 mg by mouth daily.     iron polysaccharides (NIFEREX) 150 MG capsule Take 150 mg by mouth daily.     metFORMIN (GLUCOPHAGE-XR) 500 MG 24 hr tablet Take 1,000 mg by mouth 2 (two) times daily.     methocarbamol (ROBAXIN) 500 MG tablet Take 1 tablet (500 mg total) by mouth every 8 (eight) hours as needed for muscle spasms. (Patient not taking: Reported on 11/03/2022) 30 tablet 0   MIRABEGRON ER PO Take 1 tablet by mouth daily. (Patient not taking: Reported on 11/03/2022)     Multiple Vitamins-Minerals (HAIR SKIN & NAILS PO) Take by mouth daily.     Multiple Vitamins-Minerals (MULTIVITAMIN WITH MINERALS) tablet Take 1 tablet by mouth daily.     Omega 3 1000 MG CAPS Take 1,000 mg by mouth daily.     pantoprazole (PROTONIX) 40 MG tablet Take 40 mg by mouth daily.     Probiotic Product (PROBIOTIC PO) Take by mouth daily.     vitamin E 180 MG (400 UNITS) capsule Take 400 Units by mouth daily.     No current facility-administered medications for this visit.      Marland Kitchen  PHYSICAL EXAMINATION: ECOG PERFORMANCE STATUS: 0 - Asymptomatic  Vitals:   11/04/22 1445  BP: (!) 190/78  Pulse: 78  Resp: 18  Temp: (!) 97 F (36.1 C)  SpO2: 100%   Filed Weights   11/04/22 1445  Weight: 147 lb  6.4 oz (66.9 kg)    Physical Exam Constitutional:      Comments: Patient is alone.  Ambulating dependently.  HENT:     Head: Normocephalic and atraumatic.     Mouth/Throat:     Pharynx: No oropharyngeal exudate.  Eyes:     Pupils: Pupils are equal, round, and reactive to light.  Cardiovascular:     Rate and Rhythm: Normal rate and regular rhythm.  Pulmonary:     Effort: Pulmonary effort is normal. No respiratory distress.     Breath sounds: Normal breath sounds. No wheezing.  Abdominal:     General: Bowel sounds are normal. There is no distension.     Palpations: Abdomen is soft. There is no mass.     Tenderness: There is no abdominal tenderness. There is no guarding  or rebound.  Musculoskeletal:        General: No tenderness. Normal range of motion.     Cervical back: Normal range of motion and neck supple.  Skin:    General: Skin is warm.  Neurological:     Mental Status: She is alert and oriented to person, place, and time.  Psychiatric:        Mood and Affect: Affect normal.    LABORATORY DATA:  I have reviewed the data as listed Lab Results  Component Value Date   WBC 4.3 11/04/2022   HGB 9.7 (L) 11/04/2022   HCT 29.9 (L) 11/04/2022   MCV 96.1 11/04/2022   PLT 118 (L) 11/04/2022   Recent Labs    05/23/22 1242 05/24/22 0519 07/26/22 1257 09/23/22 1537 11/04/22 1428  NA 138   < > 140 140 142  K 3.4*   < > 4.2 4.4 4.3  CL 101   < > 106 106 109  CO2 25   < > 23 23 22   GLUCOSE 180*   < > 212* 302* 207*  BUN 29*   < > 32* 36* 34*  CREATININE 0.97   < > 0.95 1.19* 1.10*  CALCIUM 10.2   < > 9.2 9.3 9.2  GFRNONAA 57*   < > 59* 45* 49*  PROT 7.6  --   --   --   --   ALBUMIN 4.0  --   --   --   --   AST 10*  --   --   --   --   ALT 11  --   --   --   --   ALKPHOS 90  --   --   --   --   BILITOT 0.8  --   --   --   --    < > = values in this interval not displayed.    RADIOGRAPHIC STUDIES: I have personally reviewed the radiological images as listed and  agreed with the findings in the report. IR BONE MARROW BIOPSY & ASPIRATION  Result Date: 10/28/2022 INDICATION: anemia- worsening; and thrombocytopenia. EXAM: Bone marrow aspiration and core biopsy using fluoroscopic guidance MEDICATIONS: None. ANESTHESIA/SEDATION: Moderate (conscious) sedation was employed during this procedure. A total of Versed 1 mg and Fentanyl 50 mcg was administered intravenously. Moderate Sedation Time: 13 minutes. The patient's level of consciousness and vital signs were monitored continuously by radiology nursing throughout the procedure under my direct supervision. FLUOROSCOPY TIME:  Fluoroscopy Time: 0.4 minutes (5 mGy) COMPLICATIONS: None immediate. PROCEDURE: Informed written consent was obtained from the patient after a thorough discussion of the procedural risks, benefits and alternatives. All questions were addressed. Maximal Sterile Barrier Technique was utilized including caps, mask, sterile gowns, sterile gloves, sterile drape, hand hygiene and skin antiseptic. A timeout was performed prior to the initiation of the procedure. The patient was placed prone on the IR exam table. Limited fluoroscopy of the pelvis was performed for planning purposes. Skin entry site was marked, and the overlying skin was prepped and draped in the standard sterile fashion. Local analgesia was obtained with 1% lidocaine. Using fluoroscopic guidance, an 11 gauge needle was advanced just deep to the cortex of the right posterior ilium. Subsequently, bone marrow aspiration and core biopsy were performed. Specimens were submitted to lab/pathology for handling. Hemostasis was achieved with manual pressure, and a clean dressing was placed. The patient tolerated the procedure well without immediate complication. IMPRESSION: Successful bone marrow aspiration and core  biopsy of the right posterior ilium using fluoroscopic guidance. Electronically Signed   By: Olive Bass M.D.   On: 10/28/2022 10:52     ASSESSMENT & PLAN:   Thrombocytopenia (HCC) #Chronic mild anemia-hemoglobin  10-11; MAY 2024- Iron sat-15% ; ferritin- 169  Not totally consistent with iron deficiency- ? CKD stage III-  # MAY 2024-bone marrow biopsy-not suggestive of myelodysplastic syndrome.  Cytogenetics/NGS pending. MAY 2024-S/p EGD/coloscopy [Dr.Toledo];   # Today hemoglobin is 9.8 -recommend proceeding with iron infusion today and again in 2 weeks.  Discussthe possible use of Retacrit at subsequent visits if the hemoglobin is less than 10.  # ITP/ Thrombocytopenia-platelet count- > 100 -stable. on surveillance.  # Elevated HTN- keep a log BPs; Repeat 170/90. And defer to PCP, Dr.Hande.  stable.   # CKD stage III- monitor closely with BP. Hydration.   # DISPOSITION: # venofer today # venofer in 2 weeks # follow up in 2 months  MD: labs- cbc/bmp; LDH---possible Venofer OR Retacrit- -Dr.B    Earna Coder, MD 11/04/2022 4:09 PM

## 2022-11-04 NOTE — Patient Instructions (Signed)

## 2022-11-04 NOTE — Progress Notes (Signed)
Pt here for her Bone Marrow Biopsy results. Did well with procedure. Appetite is fair; eats 1 meal per day. Pt feels very run down. Having surgery next Friday with Dr Jenne Campus for a small nodule in her right sinus cavity in neck. Had colonoscopy/ EGD last week.

## 2022-11-04 NOTE — Progress Notes (Signed)
BP 190/78 while seeing MD. Post venofer infusion BP 183/71. Pt denies headache/dizziness and that bp is good in the morning after she takes her HCTZ. Pt encouraged to f/u with PCP regarding elevated pressures.

## 2022-11-07 ENCOUNTER — Other Ambulatory Visit: Payer: Self-pay | Admitting: Internal Medicine

## 2022-11-08 ENCOUNTER — Encounter (HOSPITAL_COMMUNITY): Payer: Self-pay | Admitting: Internal Medicine

## 2022-11-11 ENCOUNTER — Ambulatory Visit
Admission: RE | Admit: 2022-11-11 | Discharge: 2022-11-11 | Disposition: A | Discharge status: Home / Self Care | Payer: Medicare HMO | Source: Ambulatory Visit | Attending: Unknown Physician Specialty | Admitting: Unknown Physician Specialty

## 2022-11-11 ENCOUNTER — Encounter: Payer: Self-pay | Admitting: Unknown Physician Specialty

## 2022-11-11 ENCOUNTER — Other Ambulatory Visit: Payer: Self-pay

## 2022-11-11 ENCOUNTER — Ambulatory Visit: Payer: Medicare HMO | Admitting: Anesthesiology

## 2022-11-11 ENCOUNTER — Encounter
Admission: RE | Disposition: A | Discharge status: Home / Self Care | Payer: Self-pay | Source: Ambulatory Visit | Attending: Unknown Physician Specialty

## 2022-11-11 DIAGNOSIS — Z87891 Personal history of nicotine dependence: Secondary | ICD-10-CM | POA: Insufficient documentation

## 2022-11-11 DIAGNOSIS — I10 Essential (primary) hypertension: Secondary | ICD-10-CM | POA: Diagnosis not present

## 2022-11-11 DIAGNOSIS — M797 Fibromyalgia: Secondary | ICD-10-CM | POA: Insufficient documentation

## 2022-11-11 DIAGNOSIS — F32A Depression, unspecified: Secondary | ICD-10-CM | POA: Diagnosis not present

## 2022-11-11 DIAGNOSIS — D380 Neoplasm of uncertain behavior of larynx: Secondary | ICD-10-CM | POA: Insufficient documentation

## 2022-11-11 DIAGNOSIS — K219 Gastro-esophageal reflux disease without esophagitis: Secondary | ICD-10-CM | POA: Diagnosis not present

## 2022-11-11 DIAGNOSIS — E1151 Type 2 diabetes mellitus with diabetic peripheral angiopathy without gangrene: Secondary | ICD-10-CM | POA: Diagnosis not present

## 2022-11-11 HISTORY — PX: MICROLARYNGOSCOPY: SHX5208

## 2022-11-11 LAB — GLUCOSE, CAPILLARY: Glucose-Capillary: 157 mg/dL — ABNORMAL HIGH (ref 70–99)

## 2022-11-11 SURGERY — MICROLARYNGOSCOPY
Anesthesia: General | Site: Throat | Laterality: Right

## 2022-11-11 MED ORDER — ROCURONIUM BROMIDE 100 MG/10ML IV SOLN
INTRAVENOUS | Status: DC | PRN
  Administered 2022-11-11: 25 mg via INTRAVENOUS

## 2022-11-11 MED ORDER — OXYCODONE HCL 5 MG/5ML PO SOLN
10.0000 mg | Freq: Once | ORAL | Status: AC
  Administered 2022-11-11: 10 mg via ORAL

## 2022-11-11 MED ORDER — LIDOCAINE HCL (CARDIAC) PF 100 MG/5ML IV SOSY
PREFILLED_SYRINGE | INTRAVENOUS | Status: DC | PRN
  Administered 2022-11-11: 120 mg via INTRAVENOUS

## 2022-11-11 MED ORDER — DEXAMETHASONE SODIUM PHOSPHATE 4 MG/ML IJ SOLN
INTRAMUSCULAR | Status: DC | PRN
  Administered 2022-11-11: 8 mg via INTRAVENOUS

## 2022-11-11 MED ORDER — SUGAMMADEX SODIUM 200 MG/2ML IV SOLN
INTRAVENOUS | Status: DC | PRN
  Administered 2022-11-11: 200 mg via INTRAVENOUS

## 2022-11-11 MED ORDER — ONDANSETRON HCL 4 MG/2ML IJ SOLN
INTRAMUSCULAR | Status: DC | PRN
  Administered 2022-11-11: 4 mg via INTRAVENOUS

## 2022-11-11 MED ORDER — LACTATED RINGERS IV SOLN: INTRAVENOUS | Status: DC

## 2022-11-11 MED ORDER — PROPOFOL 10 MG/ML IV BOLUS
INTRAVENOUS | Status: DC | PRN
  Administered 2022-11-11: 100 mg via INTRAVENOUS
  Administered 2022-11-11: 150 mg via INTRAVENOUS
  Administered 2022-11-11: 50 mg via INTRAVENOUS

## 2022-11-11 MED ORDER — PHENYLEPHRINE HCL 0.5 % NA SOLN
NASAL | Status: DC | PRN
  Administered 2022-11-11: 30 mL via TOPICAL

## 2022-11-11 MED ORDER — FENTANYL CITRATE (PF) 100 MCG/2ML IJ SOLN: INTRAMUSCULAR | Status: DC | PRN

## 2022-11-11 SURGICAL SUPPLY — 21 items
ANTIFOG SOL W/FOAM PAD STRL (MISCELLANEOUS) ×1
BASIN GRAD PLASTIC 32OZ STRL (MISCELLANEOUS) ×1 IMPLANT
BLOCK BITE GUARD (MISCELLANEOUS) ×1 IMPLANT
CONT SPEC 4OZ CLIKSEAL STRL BL (MISCELLANEOUS) ×1 IMPLANT
COVER MAYO STAND STRL (DRAPES) ×1 IMPLANT
COVER TABLE BACK 60X90 (DRAPES) ×1 IMPLANT
CUP MEDICINE 2OZ PLAST GRAD ST (MISCELLANEOUS) ×1 IMPLANT
DRAPE SHEET LG 3/4 BI-LAMINATE (DRAPES) ×1 IMPLANT
DRSG TELFA 4X3 1S NADH ST (GAUZE/BANDAGES/DRESSINGS) ×1 IMPLANT
GLOVE SURG ENC TEXT LTX SZ7.5 (GLOVE) ×1 IMPLANT
KIT TURNOVER KIT A (KITS) ×1 IMPLANT
MARKER SKIN DUAL TIP RULER LAB (MISCELLANEOUS) ×1 IMPLANT
NDL FILTER BLUNT 18X1 1/2 (NEEDLE) ×1 IMPLANT
NEEDLE FILTER BLUNT 18X1 1/2 (NEEDLE) ×1 IMPLANT
NS IRRIG 500ML POUR BTL (IV SOLUTION) ×1 IMPLANT
PATTIES SURGICAL .5 X.5 (GAUZE/BANDAGES/DRESSINGS) ×1 IMPLANT
SOLUTION ANTFG W/FOAM PAD STRL (MISCELLANEOUS) ×1 IMPLANT
SPONGE XRAY 4X4 16PLY STRL (MISCELLANEOUS) ×1 IMPLANT
STRAP BODY AND KNEE 60X3 (MISCELLANEOUS) ×1 IMPLANT
TOWEL OR 17X26 4PK STRL BLUE (TOWEL DISPOSABLE) ×1 IMPLANT
TUBING SUCTION CONN 0.25 STRL (TUBING) ×1 IMPLANT

## 2022-11-11 NOTE — Op Note (Signed)
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  After general endotracheal anesthesia the table was turned 90 degrees.  The patient was edentulous a 4 x 4 was placed against the upper maxilla.  A Dedo laryngoscope was then introduced into the airway the left piriform sinus appeared clear of the right piriform sinus there was a firm mass.  The 0 degree Hopkins rod was introduced into the airway and photodocumentation was performed of the mass.  The operating microscope was then brought into the field examination of this mass using a small suction showed normal-appearing mucosa overlying a hard firm mass consistent with an osteophyte.  Phenylephrine lidocaine solution was used to bathe over the mass 2 small biopsies were taken of the mucosa overlying this.  Reinspection did appear to be an osteophyte likely emanating from the inferior aspect of the thyroid cartilage.  Based on its benign appearance I did not not think further excision was necessary.  The patient was then returned to anesthesia where she was awakened in the operating type cart in stable condition.  Dispo:   Good  Plan: Discharge to home follow-up in 3 weeks  Davina Poke  11/11/2022 10:18 AM

## 2022-11-11 NOTE — Anesthesia Procedure Notes (Signed)
Procedure Name: Intubation Date/Time: 11/11/2022 10:20 AM  Performed by: Domenic Moras, CRNAPre-anesthesia Checklist: Patient identified, Emergency Drugs available, Suction available and Patient being monitored Patient Re-evaluated:Patient Re-evaluated prior to induction Oxygen Delivery Method: Circle system utilized Preoxygenation: Pre-oxygenation with 100% oxygen Induction Type: IV induction Ventilation: Mask ventilation without difficulty Laryngoscope Size: Mac and 3 Grade View: Grade II Tube type: Oral Tube size: 7.0 mm Number of attempts: 1 Airway Equipment and Method: Stylet and Oral airway Placement Confirmation: ETT inserted through vocal cords under direct vision, positive ETCO2 and breath sounds checked- equal and bilateral Tube secured with: Tape Dental Injury: Teeth and Oropharynx as per pre-operative assessment

## 2022-11-11 NOTE — H&P (Signed)
The patient's history has been reviewed, patient examined, no change in status, stable for surgery.  Questions were answered to the patients satisfaction.  

## 2022-11-11 NOTE — Anesthesia Postprocedure Evaluation (Signed)
Anesthesia Post Note  Patient: Kim Ballard  Procedure(s) Performed: MICROLARYNGOSCOPY WITH BIOPSY OF PYRIFORM SINUS MASS (Right: Throat)  Patient location during evaluation: PACU Anesthesia Type: General Level of consciousness: awake and alert Pain management: pain level controlled Vital Signs Assessment: post-procedure vital signs reviewed and stable Respiratory status: spontaneous breathing, nonlabored ventilation, respiratory function stable and patient connected to nasal cannula oxygen Cardiovascular status: blood pressure returned to baseline and stable Postop Assessment: no apparent nausea or vomiting Anesthetic complications: no   No notable events documented.   Last Vitals:  Vitals:   11/11/22 1031 11/11/22 1050  BP: (!) 169/77 (!) 159/77  Pulse: 85 83  Resp: 15 16  Temp: (!) 36.3 C (!) 36.4 C  SpO2: 100% 96%    Last Pain:  Vitals:   11/11/22 1050  TempSrc:   PainSc: 4                  Coleson Kant C Raudel Bazen

## 2022-11-11 NOTE — Transfer of Care (Signed)
Immediate Anesthesia Transfer of Care Note  Patient: Kim Ballard  Procedure(s) Performed: MICROLARYNGOSCOPY WITH BIOPSY OF PYRIFORM SINUS MASS (Right: Throat)  Patient Location: PACU  Anesthesia Type: General ETT  Level of Consciousness: awake, alert  and patient cooperative  Airway and Oxygen Therapy: Patient Spontanous Breathing and Patient connected to supplemental oxygen  Post-op Assessment: Post-op Vital signs reviewed, Patient's Cardiovascular Status Stable, Respiratory Function Stable, Patent Airway and No signs of Nausea or vomiting  Post-op Vital Signs: Reviewed and stable  Complications: No notable events documented.

## 2022-11-14 ENCOUNTER — Encounter (HOSPITAL_COMMUNITY): Payer: Self-pay | Admitting: Internal Medicine

## 2022-11-14 ENCOUNTER — Encounter: Payer: Self-pay | Admitting: Unknown Physician Specialty

## 2022-11-18 ENCOUNTER — Inpatient Hospital Stay: Payer: Medicare HMO | Attending: Internal Medicine

## 2022-11-18 VITALS — BP 155/66 | HR 73 | Temp 97.0°F | Resp 16

## 2022-11-18 DIAGNOSIS — D509 Iron deficiency anemia, unspecified: Secondary | ICD-10-CM | POA: Insufficient documentation

## 2022-11-18 DIAGNOSIS — D508 Other iron deficiency anemias: Secondary | ICD-10-CM

## 2022-11-18 MED ORDER — SODIUM CHLORIDE 0.9 % IV SOLN
INTRAVENOUS | Status: DC
  Filled 2022-11-18: qty 250

## 2022-11-18 MED ORDER — SODIUM CHLORIDE 0.9 % IV SOLN
200.0000 mg | Freq: Once | INTRAVENOUS | Status: AC
  Administered 2022-11-18: 200 mg via INTRAVENOUS
  Filled 2022-11-18: qty 200

## 2022-11-18 NOTE — Patient Instructions (Signed)

## 2022-12-16 ENCOUNTER — Other Ambulatory Visit: Payer: Self-pay | Admitting: Physical Medicine and Rehabilitation

## 2022-12-16 DIAGNOSIS — M5416 Radiculopathy, lumbar region: Secondary | ICD-10-CM

## 2022-12-16 DIAGNOSIS — M48062 Spinal stenosis, lumbar region with neurogenic claudication: Secondary | ICD-10-CM

## 2022-12-20 ENCOUNTER — Ambulatory Visit
Admission: EM | Admit: 2022-12-20 | Discharge: 2022-12-20 | Disposition: A | Discharge status: Home / Self Care | Payer: Medicare HMO

## 2022-12-20 DIAGNOSIS — J0110 Acute frontal sinusitis, unspecified: Secondary | ICD-10-CM | POA: Diagnosis not present

## 2022-12-20 DIAGNOSIS — H9202 Otalgia, left ear: Secondary | ICD-10-CM

## 2022-12-20 MED ORDER — AZITHROMYCIN 250 MG PO TABS: 250.0000 mg | ORAL_TABLET | Freq: Every day | ORAL | 0 refills | Status: DC

## 2022-12-20 MED ORDER — NEOMYCIN-POLYMYXIN-HC 3.5-10000-1 OT SUSP: 4.0000 [drp] | Freq: Three times a day (TID) | OTIC | 0 refills | Status: DC

## 2022-12-20 NOTE — ED Triage Notes (Signed)
Pt c/o L sided hearing loss radiating down neck x5 days. Has tried some ear drops w/o relief.

## 2022-12-20 NOTE — ED Provider Notes (Signed)
[facility]    CSN: [id number] Arrival date & time: 12/20/22  1756      History   Chief Complaint Chief Complaint  Patient presents with   Hearing Loss    HPI Kim Ballard is a 88 y.o. female.   Patient presents for evaluation of left-sided ear pain, decreased hearing, drainage to the throat causing increased throat clearing, burning to the eyes present for 5 days.  Ear pain radiating to the neck.  Believes she has some sort of infection.  Has had similar symptoms in the past requiring eardrops for treatment.  Tried an old prescription of eardrops but this was ineffective.  Denies fever, ear drainage or pruritus, cough or shortness of breath.    Past Medical History:  Diagnosis Date   Allergic state    Anemia    Arthritis    Cancer (HCC)    skin cancers   Chronic cystitis with hematuria    Complication of anesthesia 10/28/2022   Excessive night sweats and itching after procedure 10/28/22   Depression    Diabetes mellitus without complication (HCC)    Fibrocystic disease of both breasts    Fibromyalgia    GERD (gastroesophageal reflux disease)    Hypertension    IBS (irritable bowel syndrome)    Melanoma (HCC) 07/09/2021   lt neck/shoulder area   Melanoma of neck (HCC) 09/06/2021   left   Nausea    Skin cancer    Wears dentures    full upper and lower    Patient Active Problem List   Diagnosis Date Noted   Anemia due to stage 3a chronic kidney disease (HCC) 11/04/2022   Hypertension 07/26/2022   Depression 07/26/2022   Diabetes mellitus without complication (HCC) 05/23/2022   Hypokalemia 05/23/2022   Right hip pain 05/23/2022   Hypomagnesemia 05/23/2022   Swelling of left lower extremity 11/19/2021   Melanoma of neck (HCC) 07/01/2021   Moderate episode of recurrent major depressive disorder (HCC) 11/05/2020   Irritable bowel syndrome with constipation and diarrhea 11/05/2020   Sensorineural hearing loss (SNHL) of both ears 07/29/2020    Osteopenia 01/30/2020   Neuropathic pain of both feet 01/30/2020   Melanoma of back (HCC) 01/30/2020   Iron deficiency 08/13/2019   Recurrent UTI 07/01/2019   Hx of adenomatous colonic polyps 01/22/2019   Esophageal dysphagia 01/21/2019   Fatigue 09/18/2017   Leukopenia 04/26/2016   Pancytopenia (HCC) 04/26/2016   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   Hyperlipidemia 02/19/2016   Diabetes mellitus (HCC) 02/19/2016   Iron deficiency anemia 02/08/2016   Anemia 01/18/2016   Thrombocytopenia (HCC) 01/18/2016   Gastroesophageal reflux disease without esophagitis 07/30/2015   Urge incontinence 05/14/2015   Other chronic cystitis without hematuria 05/14/2015    Past Surgical History:  Procedure Laterality Date   ABDOMINAL HYSTERECTOMY     APPENDECTOMY     BREAST BIOPSY Right 2001   surgical bx    CATARACT EXTRACTION W/PHACO Right 03/08/2017   Procedure: CATARACT EXTRACTION PHACO AND INTRAOCULAR LENS PLACEMENT (IOC) RIGHT DIABETIC TORIC;  Surgeon: Lockie Mola, MD;  Location: [facility];  Service: Ophthalmology;  Laterality: Right;  Diabetic - oral meds   CATARACT EXTRACTION W/PHACO Left 04/17/2017   Procedure: CATARACT EXTRACTION PHACO AND INTRAOCULAR LENS PLACEMENT (IOC) LEFT DIABETIC;  Surgeon: Lockie Mola, MD;  Location: [facility];  Service: Ophthalmology;  Laterality: Left;  diabetic-oral meds   CHOLECYSTECTOMY     COLONOSCOPY     COLONOSCOPY N/A 10/05/2022  Procedure: COLONOSCOPY;  Surgeon: Toledo, Boykin Nearing, MD;  Location: [facility] END[facility]COPY;  Service: Gastroenterology;  Laterality: N/A;   COLONOSCOPY WITH PROPOFOL N/A 08/27/2015   Procedure: COLONOSCOPY WITH PROPOFOL;  Surgeon: Scot Jun, MD;  Location: [facility] END[facility]COPY;  Service: Endoscopy;  Laterality: N/A;   COLONOSCOPY WITH PROPOFOL N/A 05/02/2016   Procedure: COLONOSCOPY WITH PROPOFOL;  Surgeon: Scot Jun, MD;  Location: [facility] END[facility]COPY;  Service: Endoscopy;  Laterality: N/A;    ESOPHAGOGASTRODUODENOSCOPY     ESOPHAGOGASTRODUODENOSCOPY N/A 10/05/2022   Procedure: ESOPHAGOGASTRODUODENOSCOPY (EGD);  Surgeon: Toledo, Boykin Nearing, MD;  Location: [facility] ENDOSCOPY;  Service: Gastroenterology;  Laterality: N/A;   ESOPHAGOGASTRODUODENOSCOPY (EGD) WITH PROPOFOL N/A 08/27/2015   Procedure: ESOPHAGOGASTRODUODENOSCOPY (EGD) WITH PROPOFOL;  Surgeon: Scot Jun, MD;  Location: [facility] ENDOSCOPY;  Service: Endoscopy;  Laterality: N/A;   EYE SURGERY     HERNIA REPAIR     IR BONE MARROW BIOPSY & ASPIRATION  10/28/2022   MICROLARYNGOSCOPY Right 11/11/2022   Procedure: MICROLARYNGOSCOPY WITH BIOPSY OF PYRIFORM SINUS MASS;  Surgeon: Linus Salmons, MD;  Location: [facility];  Service: ENT;  Laterality: Right;  Diabetic    OB History   No obstetric history on file.      Home Medications    Prior to Admission medications   Medication Sig Start Date End Date Taking? Authorizing Provider  Ascorbic Acid (VITAMIN C) 1000 MG tablet Take 1,000 mg by mouth 2 (two) times daily.   Yes [provider]  cholecalciferol (VITAMIN D3) 25 MCG (1000 UT) tablet Take 1,000 Units by mouth 2 (two) times daily.   Yes [provider]  cyanocobalamin (VITAMIN B12) 1000 MCG tablet Take 1,000 mcg by mouth daily.   Yes [provider]  fluticasone (FLONASE) 50 MCG/ACT nasal spray Place 2 sprays into both nostrils daily as needed for allergies.   Yes [provider]  gemfibrozil (LOPID) 600 MG tablet Take 600 mg by mouth 2 (two) times daily before a meal.   Yes [provider]  glipiZIDE (GLUCOTROL XL) 5 MG 24 hr tablet Take 10 mg by mouth in the morning and at bedtime.   Yes [provider]  hydrochlorothiazide (HYDRODIURIL) 25 MG tablet Take 25 mg by mouth daily.   Yes [provider]  iron polysaccharides (NIFEREX) 150 MG capsule Take 150 mg by mouth daily.   Yes [provider]  metFORMIN (GLUCOPHAGE-XR) 500 MG 24 hr tablet  Take 1,000 mg by mouth 2 (two) times daily. 04/14/22  Yes [provider]  methocarbamol (ROBAXIN) 500 MG tablet Take 1 tablet (500 mg total) by mouth every 8 (eight) hours as needed for muscle spasms. 05/24/22  Yes Lewie Chamber, MD  MIRABEGRON ER PO Take 1 tablet by mouth daily.   Yes [provider]  Multiple Vitamins-Minerals (HAIR SKIN & NAILS PO) Take by mouth daily.   Yes [provider]  Multiple Vitamins-Minerals (MULTIVITAMIN WITH MINERALS) tablet Take 1 tablet by mouth daily.   Yes [provider]  Omega 3 1000 MG CAPS Take 1,000 mg by mouth daily.   Yes [provider]  pantoprazole (PROTONIX) 40 MG tablet Take 40 mg by mouth daily.   Yes [provider]  Probiotic Product (PROBIOTIC PO) Take by mouth daily.   Yes [provider]  sucralfate (CARAFATE) 1 g tablet Take 1 g by mouth 2 (two) times daily.   Yes [provider]  vitamin E 180 MG (400 UNITS) capsule Take 400 Units by mouth daily.  Yes [provider]    Family History Family History  Problem Relation Age of Onset   Hypertension Mother    Ovarian cancer Paternal Grandmother    COPD Father    Hyperlipidemia Father    Kidney disease Father    Diabetes Maternal Grandmother    Breast cancer Neg Hx     Social History Social History   Tobacco Use   Smoking status: Former    Current packs/day: 0.00    Types: Cigarettes    Quit date: 1960    Years since quitting: 64.5   Smokeless tobacco: Never  Vaping Use   Vaping status: Never Used  Substance Use Topics   Alcohol use: No   Drug use: No     Allergies   Biaxin [clarithromycin], Brompheniramine maleate, Ceclor [cefaclor], Ciprofloxacin, Clinoril [sulindac], Codeine sulfate, Diovan [valsartan], Doxycycline, Erythromycin, Esomeprazole, Fluoxetine, Guaifenesin & derivatives, Levaquin [levofloxacin in d5w], Lisinopril, Lodine [etodolac], Macrodantin [nitrofurantoin macrocrystal],  Medrol [methylprednisolone], Mobic [meloxicam], Motrin [ibuprofen], Nexium [esomeprazole magnesium], Nitrofurantoin, Norvasc [amlodipine besylate], Omnicef [cefdinir], Penicillins, Prednisone, Prozac [fluoxetine hcl], Pseudoephedrine, Reglan [metoclopramide], Seldane [terfenadine], Sulfa antibiotics, Toprol xl [metoprolol tartrate], Triamterene, Tussionex pennkinetic er [hydrocod poli-chlorphe poli er], Vibramycin [doxycycline calcium], and Amlodipine   Review of Systems Review of Systems   Physical Exam Triage Vital Signs ED Triage Vitals  Encounter Vitals Group     BP 12/20/22 1759 (!) 198/81     Systolic BP Percentile --      Diastolic BP Percentile --      Pulse Rate 12/20/22 1759 82     Resp 12/20/22 1759 16     Temp 12/20/22 1759 98.3 F (36.8 C)     Temp Source 12/20/22 1759 Oral     SpO2 12/20/22 1759 97 %     Weight 12/20/22 1759 154 lb (69.9 kg)     Height 12/20/22 1759 5\' 7"  (1.702 m)     Head Circumference --      Peak Flow --      Pain Score 12/20/22 1803 10     Pain Loc --      Pain Education --      Exclude from Growth Chart --    No data found.  Updated Vital Signs BP (!) 198/81 (BP Location: Left Arm)   Pulse 82   Temp 98.3 F (36.8 C) (Oral)   Resp 16   Ht 5\' 7"  (1.702 m)   Wt 154 lb (69.9 kg)   SpO2 97%   BMI 24.12 kg/m   Visual Acuity Right Eye Distance:   Left Eye Distance:   Bilateral Distance:    Right Eye Near:   Left Eye Near:    Bilateral Near:     Physical Exam Constitutional:      Appearance: Normal appearance.  HENT:     Head: Normocephalic.     Right Ear: Tympanic membrane, ear canal and external ear normal.     Left Ear: Tympanic membrane, ear canal and external ear normal.     Nose: No congestion or rhinorrhea.     Right Sinus: No maxillary sinus tenderness or frontal sinus tenderness.     Left Sinus: Frontal sinus tenderness present. No maxillary sinus tenderness.     Mouth/Throat:     Pharynx: Posterior oropharyngeal  erythema present.  Eyes:     Extraocular Movements: Extraocular movements intact.  Pulmonary:     Effort: Pulmonary effort is normal.  Neurological:     Mental Status: She is alert and  oriented to person, place, and time. Mental status is at baseline.      UC Treatments / Results  Labs (all labs ordered are listed, but only abnormal results are displayed) Labs Reviewed - No data to display  EKG   Radiology No results found.  Procedures Procedures (including critical care time)  Medications Ordered in UC Medications - No data to display  Initial Impression / Assessment and Plan / UC Course  I have reviewed the triage vital signs and the nursing notes.  Pertinent labs & imaging results that were available during my care of the patient were reviewed by me and considered in my medical decision making (see chart for details).  Acute nonrecurrent frontal sinusitis, otalgia of left ear  No abnormalities to the bilateral tympanic membranes or canals, discussed this with patient, tenderness is present to the frontal sinus and she endorses a postnasal drip, symptoms present for 5 days without signs of resolution providing coverage for sinusitis which most likely is the cause of ear pain as there is no indication of infection to the ears on exam, prescribing azithromycin patient additionally requesting eardrops, Cortisporin sent to pharmacy, discussed administration, advised against ear cleaning or object placement within the canal, recommended Tylenol for pain management, advise follow-up with PCP if symptoms persist or worsen Final Clinical Impressions(s) / UC Diagnoses   Final diagnoses:  None   Discharge Instructions   None    ED Prescriptions   None    PDMP not reviewed this encounter.   Valinda Hoar, NP 12/20/22 [phone]

## 2022-12-20 NOTE — Discharge Instructions (Addendum)
Today you are being treated for a sinus infection which I believe is the cause of your ear pain  On exam I am able to tap on the forehead and cause tenderness, on exam there are no abnormalities to your ear canal or the eardrum that indicate infection  Begin azithromycin as directed to clear any germs from the sinus cavity which may be contributing to your symptoms  You may place 4 drops every 8 hours into the left ear for additional comfort  You may take Tylenol as needed for pain  You may hold warm compresses to the external ear and 10 to 15-minute intervals  Avoid ear cleaning or placing objects inside of the ear until all symptoms have resolved  If your symptoms continue to persist please follow-up with your primary doctor or ear nose and throat doctor for reevaluation

## 2022-12-28 ENCOUNTER — Encounter: Payer: Self-pay | Admitting: Physical Medicine and Rehabilitation

## 2022-12-28 ENCOUNTER — Encounter: Payer: Self-pay | Admitting: Internal Medicine

## 2022-12-29 MED FILL — Iron Sucrose Inj 20 MG/ML (Fe Equiv): INTRAVENOUS | Qty: 10 | Status: AC

## 2022-12-30 ENCOUNTER — Inpatient Hospital Stay: Payer: Medicare HMO

## 2022-12-30 ENCOUNTER — Inpatient Hospital Stay: Payer: Medicare HMO | Attending: Internal Medicine

## 2022-12-30 ENCOUNTER — Inpatient Hospital Stay: Payer: Medicare HMO | Admitting: Internal Medicine

## 2022-12-30 ENCOUNTER — Encounter: Payer: Self-pay | Admitting: Internal Medicine

## 2022-12-30 VITALS — BP 190/77 | HR 76 | Temp 95.0°F | Resp 18 | Wt 148.9 lb

## 2022-12-30 DIAGNOSIS — D509 Iron deficiency anemia, unspecified: Secondary | ICD-10-CM

## 2022-12-30 DIAGNOSIS — Z79899 Other long term (current) drug therapy: Secondary | ICD-10-CM | POA: Insufficient documentation

## 2022-12-30 DIAGNOSIS — D508 Other iron deficiency anemias: Secondary | ICD-10-CM

## 2022-12-30 DIAGNOSIS — D649 Anemia, unspecified: Secondary | ICD-10-CM

## 2022-12-30 DIAGNOSIS — N1831 Chronic kidney disease, stage 3a: Secondary | ICD-10-CM

## 2022-12-30 LAB — CBC WITH DIFFERENTIAL (CANCER CENTER ONLY)
Abs Immature Granulocytes: 0.02 10*3/uL (ref 0.00–0.07)
Basophils Absolute: 0 10*3/uL (ref 0.0–0.1)
Basophils Relative: 1 %
Eosinophils Absolute: 0.1 10*3/uL (ref 0.0–0.5)
Eosinophils Relative: 2 %
HCT: 30.9 % — ABNORMAL LOW (ref 36.0–46.0)
Hemoglobin: 10 g/dL — ABNORMAL LOW (ref 12.0–15.0)
Immature Granulocytes: 0 %
Lymphocytes Relative: 10 %
Lymphs Abs: 0.5 10*3/uL — ABNORMAL LOW (ref 0.7–4.0)
MCH: 30.5 pg (ref 26.0–34.0)
MCHC: 32.4 g/dL (ref 30.0–36.0)
MCV: 94.2 fL (ref 80.0–100.0)
Monocytes Absolute: 0.1 10*3/uL (ref 0.1–1.0)
Monocytes Relative: 2 %
Neutro Abs: 3.8 10*3/uL (ref 1.7–7.7)
Neutrophils Relative %: 85 %
Platelet Count: 139 10*3/uL — ABNORMAL LOW (ref 150–400)
RBC: 3.28 MIL/uL — ABNORMAL LOW (ref 3.87–5.11)
RDW: 13.2 % (ref 11.5–15.5)
WBC Count: 4.5 10*3/uL (ref 4.0–10.5)
nRBC: 0 % (ref 0.0–0.2)

## 2022-12-30 LAB — BASIC METABOLIC PANEL - CANCER CENTER ONLY
Anion gap: 12 (ref 5–15)
BUN: 37 mg/dL — ABNORMAL HIGH (ref 8–23)
CO2: 23 mmol/L (ref 22–32)
Calcium: 9.5 mg/dL (ref 8.9–10.3)
Chloride: 105 mmol/L (ref 98–111)
Creatinine: 1.08 mg/dL — ABNORMAL HIGH (ref 0.44–1.00)
GFR, Estimated: 50 mL/min — ABNORMAL LOW (ref >60)
Glucose, Bld: 327 mg/dL — ABNORMAL HIGH (ref 70–99)
Potassium: 4.3 mmol/L (ref 3.5–5.1)
Sodium: 140 mmol/L (ref 135–145)

## 2022-12-30 LAB — LACTATE DEHYDROGENASE: LDH: 151 U/L (ref 98–192)

## 2022-12-30 MED ORDER — SODIUM CHLORIDE 0.9 % IV SOLN
200.0000 mg | Freq: Once | INTRAVENOUS | Status: AC
  Administered 2022-12-30: 200 mg via INTRAVENOUS
  Filled 2022-12-30: qty 200

## 2022-12-30 NOTE — Patient Instructions (Signed)
Iron Sucrose Injection What is this medication? IRON SUCROSE (EYE ern SOO krose) treats low levels of iron (iron deficiency anemia) in people with kidney disease. Iron is a mineral that plays an important role in making red blood cells, which carry oxygen from your lungs to the rest of your body. This medicine may be used for other purposes; ask your health care provider or pharmacist if you have questions. COMMON BRAND NAME(S): Venofer What should I tell my care team before I take this medication? They need to know if you have any of these conditions: Anemia not caused by low iron levels Heart disease High levels of iron in the blood Kidney disease Liver disease An unusual or allergic reaction to iron, other medications, foods, dyes, or preservatives Pregnant or trying to get pregnant Breastfeeding How should I use this medication? This medication is for infusion into a vein. It is given in a hospital or clinic setting. Talk to your care team about the use of this medication in children. While this medication may be prescribed for children as young as 2 years for selected conditions, precautions do apply. Overdosage: If you think you have taken too much of this medicine contact a poison control center or emergency room at once. NOTE: This medicine is only for you. Do not share this medicine with others. What if I miss a dose? Keep appointments for follow-up doses. It is important not to miss your dose. Call your care team if you are unable to keep an appointment. What may interact with this medication? Do not take this medication with any of the following: Deferoxamine Dimercaprol Other iron products This medication may also interact with the following: Chloramphenicol Deferasirox This list may not describe all possible interactions. Give your health care provider a list of all the medicines, herbs, non-prescription drugs, or dietary supplements you use. Also tell them if you smoke,  drink alcohol, or use illegal drugs. Some items may interact with your medicine. What should I watch for while using this medication? Visit your care team regularly. Tell your care team if your symptoms do not start to get better or if they get worse. You may need blood work done while you are taking this medication. You may need to follow a special diet. Talk to your care team. Foods that contain iron include: whole grains/cereals, dried fruits, beans, or peas, leafy green vegetables, and organ meats (liver, kidney). What side effects may I notice from receiving this medication? Side effects that you should report to your care team as soon as possible: Allergic reactions--skin rash, itching, hives, swelling of the face, lips, tongue, or throat Low blood pressure--dizziness, feeling faint or lightheaded, blurry vision Shortness of breath Side effects that usually do not require medical attention (report to your care team if they continue or are bothersome): Flushing Headache Joint pain Muscle pain Nausea Pain, redness, or irritation at injection site This list may not describe all possible side effects. Call your doctor for medical advice about side effects. You may report side effects to FDA at 1-800-FDA-1088. Where should I keep my medication? This medication is given in a hospital or clinic. It will not be stored at home. NOTE: This sheet is a summary. It may not cover all possible information. If you have questions about this medicine, talk to your doctor, pharmacist, or health care provider.  2024 Elsevier/Gold Standard (2022-10-28 00:00:00)

## 2022-12-30 NOTE — Progress Notes (Signed)
Patient states that overall everything is going ok. She states that she does not have much energy.

## 2022-12-30 NOTE — Progress Notes (Signed)
[facility] CONSULT NOTE  Patient Care Team: Barbette Reichmann, MD as PCP - General (Internal Medicine) Barbette Reichmann, MD as Physician Assistant (Internal Medicine) Rosey Bath, MD (Inactive) as Consulting Physician (Hematology and Oncology) Earna Coder, MD as Consulting Physician (Oncology)  CHIEF COMPLAINTS/PURPOSE OF CONSULTATION: Iron deficiency anemia/thrombocytopenia  #Chronic [2015] mild anemia hemoglobin 11.1-question iron deficiency versus others.-Dr. Elliott/Dr. Merlene Pulling; s/p Venofer. MAY 2024- BONE MARROW, ASPIRATE, CLOT, CORE:  - Normocellular bone marrow (20%) with trilineage hematopoiesis and no  increase in blasts.  Morphologic evaluation and immunohistochemical analysis do not provide a  precise explanation for the patient's anemia and thrombocytopenia. The  absence of significant morphologic dysplasia makes a myelodysplastic  syndrome less likely and secondary causes should be considered; however,  correlation with pending cytogenetic and molecular studies will be of  interest to exclude a morphologically subtle evolving myeloid neoplasm.   #Mild thrombocytopenia platelets 120s  #Fatigue/ PN/DM; MELANOMA of mid back [May 2021] s/p excision. MELANOMA LEFT neck posterior-s/p excision.[JAN 2023 ]  Oncology History   No history exists.    HISTORY OF PRESENTING ILLNESS: Alone.  Ambulating independently.  Blake Divine 88 y.o.  female longstanding history of iron deficient anemia/thrombocytopenia is here for follow-up.   Patient states that overall everything is going ok. She states that she does not have much energy.    No fever no chills.  No nausea or vomiting.  No blood in stools or black or stools.  Review of Systems  Constitutional:  Positive for malaise/fatigue. Negative for chills, diaphoresis, fever and weight loss.  HENT:  Negative for nosebleeds and sore throat.   Eyes:  Negative for double vision.  Respiratory:   Negative for cough, hemoptysis, sputum production, shortness of breath and wheezing.   Cardiovascular:  Negative for chest pain, palpitations, orthopnea and leg swelling.  Gastrointestinal:  Negative for blood in stool, constipation, diarrhea, heartburn, melena, nausea and vomiting.  Musculoskeletal:  Negative for back pain and joint pain.  Skin: Negative.  Negative for itching and rash.  Neurological:  Positive for tingling. Negative for dizziness, focal weakness, weakness and headaches.  Endo/Heme/Allergies:  Does not bruise/bleed easily.  Psychiatric/Behavioral:  Negative for depression. The patient is not nervous/anxious and does not have insomnia.      MEDICAL HISTORY:  Past Medical History:  Diagnosis Date   Allergic state    Anemia    Arthritis    Cancer (HCC)    skin cancers   Chronic cystitis with hematuria    Complication of anesthesia 10/28/2022   Excessive night sweats and itching after procedure 10/28/22   Depression    Diabetes mellitus without complication (HCC)    Fibrocystic disease of both breasts    Fibromyalgia    GERD (gastroesophageal reflux disease)    Hypertension    IBS (irritable bowel syndrome)    Melanoma (HCC) 07/09/2021   lt neck/shoulder area   Melanoma of neck (HCC) 09/06/2021   left   Nausea    Skin cancer    Wears dentures    full upper and lower    SURGICAL HISTORY: Past Surgical History:  Procedure Laterality Date   ABDOMINAL HYSTERECTOMY     APPENDECTOMY     BREAST BIOPSY Right 2001   surgical bx    CATARACT EXTRACTION W/PHACO Right 03/08/2017   Procedure: CATARACT EXTRACTION PHACO AND INTRAOCULAR LENS PLACEMENT (IOC) RIGHT DIABETIC TORIC;  Surgeon: Lockie Mola, MD;  Location: [facility];  Service: Ophthalmology;  Laterality: Right;  Diabetic - oral meds   CATARACT EXTRACTION W/PHACO Left 04/17/2017   Procedure: CATARACT EXTRACTION PHACO AND INTRAOCULAR LENS PLACEMENT (IOC) LEFT DIABETIC;  Surgeon: Lockie Mola, MD;  Location: [facility];  Service: Ophthalmology;  Laterality: Left;  diabetic-oral meds   CHOLECYSTECTOMY     COLONOSCOPY     COLONOSCOPY N/A 10/05/2022   Procedure: COLONOSCOPY;  Surgeon: Toledo, Boykin Nearing, MD;  Location: [facility] ENDOSCOPY;  Service: Gastroenterology;  Laterality: N/A;   COLONOSCOPY WITH PROPOFOL N/A 08/27/2015   Procedure: COLONOSCOPY WITH PROPOFOL;  Surgeon: Scot Jun, MD;  Location: [facility] ENDOSCOPY;  Service: Endoscopy;  Laterality: N/A;   COLONOSCOPY WITH PROPOFOL N/A 05/02/2016   Procedure: COLONOSCOPY WITH PROPOFOL;  Surgeon: Scot Jun, MD;  Location: [facility] ENDOSCOPY;  Service: Endoscopy;  Laterality: N/A;   ESOPHAGOGASTRODUODENOSCOPY     ESOPHAGOGASTRODUODENOSCOPY N/A 10/05/2022   Procedure: ESOPHAGOGASTRODUODENOSCOPY (EGD);  Surgeon: Toledo, Boykin Nearing, MD;  Location: [facility] ENDOSCOPY;  Service: Gastroenterology;  Laterality: N/A;   ESOPHAGOGASTRODUODENOSCOPY (EGD) WITH PROPOFOL N/A 08/27/2015   Procedure: ESOPHAGOGASTRODUODENOSCOPY (EGD) WITH PROPOFOL;  Surgeon: Scot Jun, MD;  Location: [facility] ENDOSCOPY;  Service: Endoscopy;  Laterality: N/A;   EYE SURGERY     HERNIA REPAIR     IR BONE MARROW BIOPSY & ASPIRATION  10/28/2022   MICROLARYNGOSCOPY Right 11/11/2022   Procedure: MICROLARYNGOSCOPY WITH BIOPSY OF PYRIFORM SINUS MASS;  Surgeon: Linus Salmons, MD;  Location: [facility];  Service: ENT;  Laterality: Right;  Diabetic    SOCIAL HISTORY: Social History   Socioeconomic History   Marital status: Widowed    Spouse name: Not on file   Number of children: Not on file   Years of education: Not on file   Highest education level: Not on file  Occupational History   Not on file  Tobacco Use   Smoking status: Former    Current packs/day: 0.00    Types: Cigarettes    Quit date: 71    Years since quitting: 64.6   Smokeless tobacco: Never  Vaping Use   Vaping status: Never Used  Substance and Sexual Activity   Alcohol  use: No   Drug use: No   Sexual activity: Not Currently  Other Topics Concern   Not on file  Social History Narrative   Not on file   Social Determinants of Health   Financial Resource Strain: Low Risk  (12/26/2022)   Received from [facility]   Overall Financial Resource Strain (CARDIA)    Difficulty of Paying Living Expenses: Not hard at all  Food Insecurity: No Food Insecurity (12/26/2022)   Received from [facility]   Hunger Vital Sign    Worried About Running Out of Food in the Last Year: Never true    Ran Out of Food in the Last Year: Never true  Transportation Needs: No Transportation Needs (12/26/2022)   Received from [facility] - Transportation    In the past 12 months, has lack of transportation kept you from medical appointments or from getting medications?: No    Lack of Transportation (Non-Medical): No  Physical Activity: Not on file  Stress: Not on file  Social Connections: Not on file  Intimate Partner Violence: Not At Risk (05/24/2022)   Humiliation, Afraid, Rape, and Kick questionnaire    Fear of Current or Ex-Partner: No    Emotionally Abused: No    Physically Abused: No    Sexually Abused: No    FAMILY  HISTORY: Family History  Problem Relation Age of Onset   Hypertension Mother    Ovarian cancer Paternal Grandmother    COPD Father    Hyperlipidemia Father    Kidney disease Father    Diabetes Maternal Grandmother    Breast cancer Neg Hx     ALLERGIES:  is allergic to biaxin [clarithromycin], brompheniramine maleate, ceclor [cefaclor], ciprofloxacin, clinoril [sulindac], codeine sulfate, diovan [valsartan], doxycycline, erythromycin, esomeprazole, fluoxetine, guaifenesin & derivatives, levaquin [levofloxacin in d5w], lisinopril, lodine [etodolac], macrodantin [nitrofurantoin macrocrystal], medrol [methylprednisolone], mobic [meloxicam], motrin [ibuprofen], nexium [esomeprazole magnesium],  nitrofurantoin, norvasc [amlodipine besylate], omnicef [cefdinir], penicillins, prednisone, prozac [fluoxetine hcl], pseudoephedrine, reglan [metoclopramide], seldane [terfenadine], sulfa antibiotics, toprol xl [metoprolol tartrate], triamterene, tussionex pennkinetic er [hydrocod poli-chlorphe poli er], vibramycin [doxycycline calcium], and amlodipine.  MEDICATIONS:  Current Outpatient Medications  Medication Sig Dispense Refill   Ascorbic Acid (VITAMIN C) 1000 MG tablet Take 1,000 mg by mouth 2 (two) times daily.     azithromycin (ZITHROMAX) 250 MG tablet Take 1 tablet (250 mg total) by mouth daily. Take first 2 tablets together, then 1 every day until finished. 6 tablet 0   cholecalciferol (VITAMIN D3) 25 MCG (1000 UT) tablet Take 1,000 Units by mouth 2 (two) times daily.     cyanocobalamin (VITAMIN B12) 1000 MCG tablet Take 1,000 mcg by mouth daily.     doxycycline (VIBRAMYCIN) 100 MG capsule Take by mouth.     fluticasone (FLONASE) 50 MCG/ACT nasal spray Place 2 sprays into both nostrils daily as needed for allergies.     gemfibrozil (LOPID) 600 MG tablet Take 600 mg by mouth 2 (two) times daily before a meal.     glipiZIDE (GLUCOTROL XL) 5 MG 24 hr tablet Take 10 mg by mouth in the morning and at bedtime.     hydrochlorothiazide (HYDRODIURIL) 25 MG tablet Take 25 mg by mouth daily.     iron polysaccharides (NIFEREX) 150 MG capsule Take 150 mg by mouth daily.     metFORMIN (GLUCOPHAGE-XR) 500 MG 24 hr tablet Take 1,000 mg by mouth 2 (two) times daily.     methocarbamol (ROBAXIN) 500 MG tablet Take 1 tablet (500 mg total) by mouth every 8 (eight) hours as needed for muscle spasms. 30 tablet 0   MIRABEGRON ER PO Take 1 tablet by mouth daily.     Multiple Vitamins-Minerals (HAIR SKIN & NAILS PO) Take by mouth daily.     Multiple Vitamins-Minerals (MULTIVITAMIN WITH MINERALS) tablet Take 1 tablet by mouth daily.     neomycin-polymyxin-hydrocortisone (CORTISPORIN) 3.5-10000-1 OTIC suspension  Place 4 drops into the left ear 3 (three) times daily. 10 mL 0   Omega 3 1000 MG CAPS Take 1,000 mg by mouth daily.     pantoprazole (PROTONIX) 40 MG tablet Take 40 mg by mouth daily.     Probiotic Product (PROBIOTIC PO) Take by mouth daily.     sucralfate (CARAFATE) 1 g tablet Take 1 g by mouth 2 (two) times daily.     vitamin E 180 MG (400 UNITS) capsule Take 400 Units by mouth daily.     No current facility-administered medications for this visit.      Marland Kitchen  PHYSICAL EXAMINATION: ECOG PERFORMANCE STATUS: 0 - Asymptomatic  Vitals:   12/30/22 1419  BP: (!) 190/77  Pulse: 76  Resp: 18  Temp: (!) 95 F (35 C)    Filed Weights   12/30/22 1419  Weight: 148 lb 14.4 oz (67.5 kg)     Physical Exam Constitutional:  Comments: Patient is alone.  Ambulating dependently.  HENT:     Head: Normocephalic and atraumatic.     Mouth/Throat:     Pharynx: No oropharyngeal exudate.  Eyes:     Pupils: Pupils are equal, round, and reactive to light.  Cardiovascular:     Rate and Rhythm: Normal rate and regular rhythm.  Pulmonary:     Effort: Pulmonary effort is normal. No respiratory distress.     Breath sounds: Normal breath sounds. No wheezing.  Abdominal:     General: Bowel sounds are normal. There is no distension.     Palpations: Abdomen is soft. There is no mass.     Tenderness: There is no abdominal tenderness. There is no guarding or rebound.  Musculoskeletal:        General: No tenderness. Normal range of motion.     Cervical back: Normal range of motion and neck supple.  Skin:    General: Skin is warm.  Neurological:     Mental Status: She is alert and oriented to person, place, and time.  Psychiatric:        Mood and Affect: Affect normal.    LABORATORY DATA:  I have reviewed the data as listed Lab Results  Component Value Date   WBC 4.5 12/30/2022   HGB 10.0 (L) 12/30/2022   HCT 30.9 (L) 12/30/2022   MCV 94.2 12/30/2022   PLT 139 (L) 12/30/2022   Recent  Labs    05/23/22 1242 05/24/22 0519 07/26/22 1257 09/23/22 1537 11/04/22 1428  NA 138   < > 140 140 142  K 3.4*   < > 4.2 4.4 4.3  CL 101   < > 106 106 109  CO2 25   < > 23 23 22   GLUCOSE 180*   < > 212* 302* 207*  BUN 29*   < > 32* 36* 34*  CREATININE 0.97   < > 0.95 1.19* 1.10*  CALCIUM 10.2   < > 9.2 9.3 9.2  GFRNONAA 57*   < > 59* 45* 49*  PROT 7.6  --   --   --   --   ALBUMIN 4.0  --   --   --   --   AST 10*  --   --   --   --   ALT 11  --   --   --   --   ALKPHOS 90  --   --   --   --   BILITOT 0.8  --   --   --   --    < > = values in this interval not displayed.    RADIOGRAPHIC STUDIES: I have personally reviewed the radiological images as listed and agreed with the findings in the report. No results found.  ASSESSMENT & PLAN:   Symptomatic anemia #Chronic mild anemia-hemoglobin  10-11; MAY 2024- Iron sat-15% ; ferritin- 169  Not totally consistent with iron deficiency- ? CKD stage III-  # MAY 2024-bone marrow biopsy-not suggestive of myelodysplastic syndrome.  Cytogenetics/NGS pending. MAY 2024-S/p EGD/coloscopy [Dr.Toledo];   # Today hemoglobin is 10- -recommend proceeding with iron infusion today  Discuss the possible use of Retacrit at subsequent visits if the hemoglobin is less than 10. MAY 2024-I sat-13-   # ITP/ Thrombocytopenia-platelet count- > 100 -stable. on surveillance.  # Elevated HTN- keep a log BPs; Repeat 170/90. And defer to PCP, Dr.Hande.  stable.   # CKD stage III- monitor closely with BP. Hydration.   #  DISPOSITION: # venofer today # follow up in 2 months  MD: labs- cbc/bmp; LDH---possible Venofer OR Retacrit- -Dr.B    Earna Coder, MD 12/30/2022 2:39 PM

## 2022-12-30 NOTE — Assessment & Plan Note (Addendum)
#  Chronic mild anemia-hemoglobin  10-11; MAY 2024- Iron sat-15% ; ferritin- 169  Not totally consistent with iron deficiency- ? CKD stage III-  # MAY 2024-bone marrow biopsy-not suggestive of myelodysplastic syndrome.  Cytogenetics/NGS pending. MAY 2024-S/p EGD/coloscopy [Dr.Toledo];   # Today hemoglobin is 10- -recommend proceeding with iron infusion today  Discuss the possible use of Retacrit at subsequent visits if the hemoglobin is less than 10. MAY 2024-I sat-13-   # ITP/ Thrombocytopenia-platelet count- > 100 -stable. on surveillance.  # Elevated HTN- keep a log BPs; Repeat 170/90. And defer to PCP, Dr.Hande.  stable.   # CKD stage III- monitor closely with BP. Hydration.   # DISPOSITION: # venofer today; no retacrit # follow up in 4 months  MD: labs- cbc/bmp; LDH---possible Venofer OR Retacrit- -Dr.B

## 2022-12-31 ENCOUNTER — Ambulatory Visit
Admission: RE | Admit: 2022-12-31 | Discharge: 2022-12-31 | Disposition: A | Discharge status: Home / Self Care | Payer: Medicare HMO | Source: Ambulatory Visit | Attending: Physical Medicine and Rehabilitation | Admitting: Physical Medicine and Rehabilitation

## 2022-12-31 DIAGNOSIS — M48062 Spinal stenosis, lumbar region with neurogenic claudication: Secondary | ICD-10-CM

## 2022-12-31 DIAGNOSIS — M5416 Radiculopathy, lumbar region: Secondary | ICD-10-CM

## 2023-01-12 ENCOUNTER — Other Ambulatory Visit: Payer: Self-pay | Admitting: Unknown Physician Specialty

## 2023-01-12 DIAGNOSIS — H903 Sensorineural hearing loss, bilateral: Secondary | ICD-10-CM

## 2023-01-12 DIAGNOSIS — H9209 Otalgia, unspecified ear: Secondary | ICD-10-CM

## 2023-01-15 ENCOUNTER — Ambulatory Visit
Admission: RE | Admit: 2023-01-15 | Discharge: 2023-01-15 | Disposition: A | Discharge status: Home / Self Care | Payer: Medicare HMO | Source: Ambulatory Visit | Attending: Unknown Physician Specialty | Admitting: Unknown Physician Specialty

## 2023-01-15 DIAGNOSIS — H903 Sensorineural hearing loss, bilateral: Secondary | ICD-10-CM

## 2023-01-15 DIAGNOSIS — H9209 Otalgia, unspecified ear: Secondary | ICD-10-CM

## 2023-01-15 MED ORDER — GADOBUTROL 1 MMOL/ML IV SOLN
6.0000 mL | Freq: Once | INTRAVENOUS | Status: AC | PRN
  Administered 2023-01-15: 6 mL via INTRAVENOUS

## 2023-02-27 ENCOUNTER — Other Ambulatory Visit: Payer: Self-pay | Admitting: Physical Medicine and Rehabilitation

## 2023-02-27 ENCOUNTER — Ambulatory Visit
Admission: RE | Admit: 2023-02-27 | Discharge: 2023-02-27 | Disposition: A | Discharge status: Home / Self Care | Payer: Medicare HMO | Source: Ambulatory Visit | Attending: Physical Medicine and Rehabilitation | Admitting: Physical Medicine and Rehabilitation

## 2023-02-27 DIAGNOSIS — M79662 Pain in left lower leg: Secondary | ICD-10-CM

## 2023-03-29 ENCOUNTER — Other Ambulatory Visit: Payer: Self-pay | Admitting: Internal Medicine

## 2023-03-29 ENCOUNTER — Ambulatory Visit
Admission: RE | Admit: 2023-03-29 | Discharge: 2023-03-29 | Disposition: A | Discharge status: Home / Self Care | Payer: Medicare HMO | Source: Ambulatory Visit | Attending: Internal Medicine | Admitting: Internal Medicine

## 2023-03-29 DIAGNOSIS — M79662 Pain in left lower leg: Secondary | ICD-10-CM | POA: Insufficient documentation

## 2023-03-29 DIAGNOSIS — M7989 Other specified soft tissue disorders: Secondary | ICD-10-CM | POA: Diagnosis present

## 2023-04-11 ENCOUNTER — Other Ambulatory Visit: Payer: Self-pay | Admitting: Internal Medicine

## 2023-04-11 DIAGNOSIS — R0789 Other chest pain: Secondary | ICD-10-CM

## 2023-04-21 ENCOUNTER — Ambulatory Visit
Admission: RE | Admit: 2023-04-21 | Discharge: 2023-04-21 | Disposition: A | Discharge status: Home / Self Care | Payer: Medicare HMO | Source: Ambulatory Visit | Attending: Internal Medicine | Admitting: Internal Medicine

## 2023-04-21 DIAGNOSIS — R0789 Other chest pain: Secondary | ICD-10-CM | POA: Diagnosis present

## 2023-04-21 MED ORDER — IOHEXOL 300 MG/ML  SOLN
75.0000 mL | Freq: Once | INTRAMUSCULAR | Status: AC | PRN
  Administered 2023-04-21: 75 mL via INTRAVENOUS

## 2023-05-02 ENCOUNTER — Inpatient Hospital Stay: Payer: Medicare HMO | Admitting: Internal Medicine

## 2023-05-02 ENCOUNTER — Inpatient Hospital Stay: Payer: Medicare HMO

## 2023-05-02 ENCOUNTER — Other Ambulatory Visit: Payer: Self-pay | Admitting: Family Medicine

## 2023-05-02 ENCOUNTER — Ambulatory Visit
Admission: RE | Admit: 2023-05-02 | Discharge: 2023-05-02 | Disposition: A | Discharge status: Home / Self Care | Payer: Medicare HMO | Source: Ambulatory Visit | Attending: Family Medicine | Admitting: Family Medicine

## 2023-05-02 DIAGNOSIS — M25552 Pain in left hip: Secondary | ICD-10-CM

## 2023-05-08 ENCOUNTER — Other Ambulatory Visit: Payer: Self-pay

## 2023-05-08 ENCOUNTER — Inpatient Hospital Stay: Payer: Medicare HMO | Attending: Internal Medicine

## 2023-05-08 ENCOUNTER — Encounter: Payer: Self-pay | Admitting: Internal Medicine

## 2023-05-08 ENCOUNTER — Inpatient Hospital Stay: Payer: Medicare HMO | Admitting: Internal Medicine

## 2023-05-08 ENCOUNTER — Ambulatory Visit: Payer: Medicare HMO

## 2023-05-08 VITALS — BP 185/83 | HR 86 | Temp 97.0°F | Ht 67.0 in | Wt 149.4 lb

## 2023-05-08 DIAGNOSIS — D508 Other iron deficiency anemias: Secondary | ICD-10-CM

## 2023-05-08 DIAGNOSIS — D509 Iron deficiency anemia, unspecified: Secondary | ICD-10-CM | POA: Insufficient documentation

## 2023-05-08 DIAGNOSIS — D649 Anemia, unspecified: Secondary | ICD-10-CM | POA: Diagnosis not present

## 2023-05-08 DIAGNOSIS — Z79899 Other long term (current) drug therapy: Secondary | ICD-10-CM | POA: Diagnosis not present

## 2023-05-08 DIAGNOSIS — N1831 Chronic kidney disease, stage 3a: Secondary | ICD-10-CM | POA: Diagnosis not present

## 2023-05-08 DIAGNOSIS — D631 Anemia in chronic kidney disease: Secondary | ICD-10-CM

## 2023-05-08 LAB — BASIC METABOLIC PANEL - CANCER CENTER ONLY
Anion gap: 12 (ref 5–15)
BUN: 31 mg/dL — ABNORMAL HIGH (ref 8–23)
CO2: 25 mmol/L (ref 22–32)
Calcium: 9.2 mg/dL (ref 8.9–10.3)
Chloride: 103 mmol/L (ref 98–111)
Creatinine: 1 mg/dL (ref 0.44–1.00)
GFR, Estimated: 55 mL/min — ABNORMAL LOW (ref >60)
Glucose, Bld: 330 mg/dL — ABNORMAL HIGH (ref 70–99)
Potassium: 4.3 mmol/L (ref 3.5–5.1)
Sodium: 140 mmol/L (ref 135–145)

## 2023-05-08 LAB — CBC WITH DIFFERENTIAL (CANCER CENTER ONLY)
Abs Immature Granulocytes: 0.05 10*3/uL (ref 0.00–0.07)
Basophils Absolute: 0 10*3/uL (ref 0.0–0.1)
Basophils Relative: 1 %
Eosinophils Absolute: 0.1 10*3/uL (ref 0.0–0.5)
Eosinophils Relative: 3 %
HCT: 29.7 % — ABNORMAL LOW (ref 36.0–46.0)
Hemoglobin: 9.9 g/dL — ABNORMAL LOW (ref 12.0–15.0)
Immature Granulocytes: 1 %
Lymphocytes Relative: 19 %
Lymphs Abs: 0.8 10*3/uL (ref 0.7–4.0)
MCH: 31 pg (ref 26.0–34.0)
MCHC: 33.3 g/dL (ref 30.0–36.0)
MCV: 93.1 fL (ref 80.0–100.0)
Monocytes Absolute: 0.3 10*3/uL (ref 0.1–1.0)
Monocytes Relative: 8 %
Neutro Abs: 2.8 10*3/uL (ref 1.7–7.7)
Neutrophils Relative %: 68 %
Platelet Count: 177 10*3/uL (ref 150–400)
RBC: 3.19 MIL/uL — ABNORMAL LOW (ref 3.87–5.11)
RDW: 12.8 % (ref 11.5–15.5)
WBC Count: 4.1 10*3/uL (ref 4.0–10.5)
nRBC: 0 % (ref 0.0–0.2)

## 2023-05-08 LAB — LACTATE DEHYDROGENASE: LDH: 142 U/L (ref 98–192)

## 2023-05-08 LAB — IRON AND TIBC
Iron: 64 ug/dL (ref 28–170)
Saturation Ratios: 16 % (ref 10.4–31.8)
TIBC: 398 ug/dL (ref 250–450)
UIBC: 334 ug/dL

## 2023-05-08 LAB — FERRITIN: Ferritin: 68 ng/mL (ref 11–307)

## 2023-05-08 MED ORDER — IRON SUCROSE 20 MG/ML IV SOLN
200.0000 mg | Freq: Once | INTRAVENOUS | Status: AC
  Administered 2023-05-08: 200 mg via INTRAVENOUS

## 2023-05-08 NOTE — Progress Notes (Signed)
[facility] CONSULT NOTE  Patient Care Team: Barbette Reichmann, MD as PCP - General (Internal Medicine) Barbette Reichmann, MD as Physician Assistant (Internal Medicine) Rosey Bath, MD (Inactive) as Consulting Physician (Hematology and Oncology) Earna Coder, MD as Consulting Physician (Oncology)  CHIEF COMPLAINTS/PURPOSE OF CONSULTATION: Iron deficiency anemia/thrombocytopenia  #Chronic [2015] mild anemia hemoglobin 11.1-question iron deficiency versus others.-Dr. Elliott/Dr. Merlene Pulling; s/p Venofer. MAY 2024- BONE MARROW, ASPIRATE, CLOT, CORE:  - Normocellular bone marrow (20%) with trilineage hematopoiesis and no  increase in blasts.  Morphologic evaluation and immunohistochemical analysis do not provide a  precise explanation for the patient's anemia and thrombocytopenia. The  absence of significant morphologic dysplasia makes a myelodysplastic  syndrome less likely and secondary causes should be considered; however,  correlation with pending cytogenetic and molecular studies will be of  interest to exclude a morphologically subtle evolving myeloid neoplasm.   #Mild thrombocytopenia platelets 120s  #Fatigue/ PN/DM; MELANOMA of mid back [May 2021] s/p excision. MELANOMA LEFT neck posterior-s/p excision.[JAN 2023 ]  Oncology History   No history exists.    HISTORY OF PRESENTING ILLNESS: Alone.  Ambulating independently.  Kim Ballard 88 y.o.  female longstanding history of iron deficient anemia/thrombocytopenia is here for follow-up.   Patient noted to have worsening abdominal pain in the left lower quadrant.  Further workup showed a possible fracture in the pelvis.  Patient is also on antibiotic for possible bladder infection.  Otherwise today denies any fever no chills.  No nausea or vomiting.  No blood in stools or black or stools.  Review of Systems  Constitutional:  Positive for malaise/fatigue. Negative for chills, diaphoresis, fever and  weight loss.  HENT:  Negative for nosebleeds and sore throat.   Eyes:  Negative for double vision.  Respiratory:  Negative for cough, hemoptysis, sputum production, shortness of breath and wheezing.   Cardiovascular:  Negative for chest pain, palpitations, orthopnea and leg swelling.  Gastrointestinal:  Negative for blood in stool, constipation, diarrhea, heartburn, melena, nausea and vomiting.  Musculoskeletal:  Negative for back pain and joint pain.  Skin: Negative.  Negative for itching and rash.  Neurological:  Positive for tingling. Negative for dizziness, focal weakness, weakness and headaches.  Endo/Heme/Allergies:  Does not bruise/bleed easily.  Psychiatric/Behavioral:  Negative for depression. The patient is not nervous/anxious and does not have insomnia.      MEDICAL HISTORY:  Past Medical History:  Diagnosis Date   Allergic state    Anemia    Arthritis    Cancer (HCC)    skin cancers   Chronic cystitis with hematuria    Complication of anesthesia 10/28/2022   Excessive night sweats and itching after procedure 10/28/22   Depression    Diabetes mellitus without complication (HCC)    Fibrocystic disease of both breasts    Fibromyalgia    GERD (gastroesophageal reflux disease)    Hypertension    IBS (irritable bowel syndrome)    Melanoma (HCC) 07/09/2021   lt neck/shoulder area   Melanoma of neck (HCC) 09/06/2021   left   Nausea    Skin cancer    Wears dentures    full upper and lower    SURGICAL HISTORY: Past Surgical History:  Procedure Laterality Date   ABDOMINAL HYSTERECTOMY     APPENDECTOMY     BREAST BIOPSY Right 2001   surgical bx    CATARACT EXTRACTION W/PHACO Right 03/08/2017   Procedure: CATARACT EXTRACTION PHACO AND INTRAOCULAR LENS PLACEMENT (IOC) RIGHT DIABETIC TORIC;  Surgeon: Lockie Mola, MD;  Location: [facility];  Service: Ophthalmology;  Laterality: Right;  Diabetic - oral meds   CATARACT EXTRACTION W/PHACO Left 04/17/2017    Procedure: CATARACT EXTRACTION PHACO AND INTRAOCULAR LENS PLACEMENT (IOC) LEFT DIABETIC;  Surgeon: Lockie Mola, MD;  Location: [facility];  Service: Ophthalmology;  Laterality: Left;  diabetic-oral meds   CHOLECYSTECTOMY     COLONOSCOPY     COLONOSCOPY N/A 10/05/2022   Procedure: COLONOSCOPY;  Surgeon: Toledo, Boykin Nearing, MD;  Location: [facility] END[facility];  Service: Gastroenterology;  Laterality: N/A;   COLONOSCOPY WITH PROPOFOL N/A 08/27/2015   Procedure: COLONOSCOPY WITH PROPOFOL;  Surgeon: Scot Jun, MD;  Location: [facility];  Service: Endoscopy;  Laterality: N/A;   COLONOSCOPY WITH PROPOFOL N/A 05/02/2016   Procedure: COLONOSCOPY WITH PROPOFOL;  Surgeon: Scot Jun, MD;  Location: [facility];  Service: Endoscopy;  Laterality: N/A;   ESOPHAGOGASTRODUODENOSCOPY     ESOPHAGOGASTRODUODENOSCOPY N/A 10/05/2022   Procedure: ESOPHAGOGASTRODUODENOSCOPY (EGD);  Surgeon: Toledo, Boykin Nearing, MD;  Location: [facility] END[facility];  Service: Gastroenterology;  Laterality: N/A;   ESOPHAGOGASTRODUODENOSCOPY (EGD) WITH PROPOFOL N/A 08/27/2015   Procedure: ESOPHAGOGASTRODUODENOSCOPY (EGD) WITH PROPOFOL;  Surgeon: Scot Jun, MD;  Location: [facility] END[facility];  Service: Endoscopy;  Laterality: N/A;   EYE SURGERY     HERNIA REPAIR     IR BONE MARROW BIOPSY & ASPIRATION  10/28/2022   MICROLARYNGOSCOPY Right 11/11/2022   Procedure: MICROLARYNGOSCOPY WITH BIOPSY OF PYRIFORM SINUS MASS;  Surgeon: Linus Salmons, MD;  Location[facility];  Service: ENT;  Laterality: Right;  Diabetic    SOCIAL HISTORY: Social History   Socioeconomic History   Marital status: Widowed    Spouse name: Not on file   Number of children: Not on file   Years of education: Not on file   Highest education level: Not on file  Occupational History   Not on file  Tobacco Use   Smoking status: Former    Current packs/day: 0.00    Types: Cigarettes    Quit date: 75    Years since quitting: 64.9    Smokeless tobacco: Never  Vaping Use   Vaping status: Never Used  Substance and Sexual Activity   Alcohol use: No   Drug use: No   Sexual activity: Not Currently  Other Topics Concern   Not on file  Social History Narrative   Not on file   Social Determinants of Health   Financial Resource Strain: Low Risk  (04/13/2023)   Received from [facility]   Overall Financial Resource Strain (CARDIA)    Difficulty of Paying Living Expenses: Not hard at all  Food Insecurity: No Food Insecurity (04/13/2023)   Received from [facility]   Hunger Vital Sign    Worried About Running Out of Food in the Last Year: Never true    Ran Out of Food in the Last Year: Never true  Transportation Needs: No Transportation Needs (04/13/2023)   Received from [facility] - Transportation    In the past 12 months, has lack of transportation kept you from medical appointments or from getting medications?: No    Lack of Transportation (Non-Medical): No  Physical Activity: Not on file  Stress: Not on file  Social Connections: Not on file  Intimate Partner Violence: Not At Risk (05/24/2022)   Humiliation, Afraid, Rape, and Kick questionnaire    Fear of Current or Ex-Partner: No    Emotionally Abused: No  Physically Abused: No    Sexually Abused: No    FAMILY HISTORY: Family History  Problem Relation Age of Onset   Hypertension Mother    Ovarian cancer Paternal Grandmother    COPD Father    Hyperlipidemia Father    Kidney disease Father    Diabetes Maternal Grandmother    Breast cancer Neg Hx     ALLERGIES:  is allergic to biaxin [clarithromycin], brompheniramine maleate, ceclor [cefaclor], ciprofloxacin, clinoril [sulindac], codeine sulfate, diovan [valsartan], doxycycline, erythromycin, esomeprazole, fluoxetine, guaifenesin & derivatives, levaquin [levofloxacin in d5w], lisinopril, lodine [etodolac], macrodantin [nitrofurantoin  macrocrystal], medrol [methylprednisolone], mobic [meloxicam], motrin [ibuprofen], nexium [esomeprazole magnesium], nitrofurantoin, norvasc [amlodipine besylate], omnicef [cefdinir], penicillins, prednisone, prozac [fluoxetine hcl], pseudoephedrine, reglan [metoclopramide], seldane [terfenadine], sulfa antibiotics, toprol xl [metoprolol tartrate], triamterene, tussionex pennkinetic er [hydrocod poli-chlorphe poli er], vibramycin [doxycycline calcium], and amlodipine.  MEDICATIONS:  Current Outpatient Medications  Medication Sig Dispense Refill   Ascorbic Acid (VITAMIN C) 1000 MG tablet Take 1,000 mg by mouth 2 (two) times daily.     cephALEXin (KEFLEX) 500 MG capsule Take 500 mg by mouth 3 (three) times daily.     cholecalciferol (VITAMIN D3) 25 MCG (1000 UT) tablet Take 1,000 Units by mouth 2 (two) times daily.     cyanocobalamin (VITAMIN B12) 1000 MCG tablet Take 1,000 mcg by mouth daily.     fluticasone (FLONASE) 50 MCG/ACT nasal spray Place 2 sprays into both nostrils daily as needed for allergies.     gemfibrozil (LOPID) 600 MG tablet Take 600 mg by mouth 2 (two) times daily before a meal.     glipiZIDE (GLUCOTROL XL) 5 MG 24 hr tablet Take 10 mg by mouth in the morning and at bedtime.     hydrochlorothiazide (HYDRODIURIL) 25 MG tablet Take 25 mg by mouth daily.     iron polysaccharides (NIFEREX) 150 MG capsule Take 150 mg by mouth daily.     metFORMIN (GLUCOPHAGE-XR) 500 MG 24 hr tablet Take 1,000 mg by mouth 2 (two) times daily.     Multiple Vitamins-Minerals (HAIR SKIN & NAILS PO) Take by mouth daily.     Multiple Vitamins-Minerals (MULTIVITAMIN WITH MINERALS) tablet Take 1 tablet by mouth daily.     neomycin-polymyxin-hydrocortisone (CORTISPORIN) 3.5-10000-1 OTIC suspension Place 4 drops into the left ear 3 (three) times daily. 10 mL 0   Omega 3 1000 MG CAPS Take 1,000 mg by mouth daily.     pantoprazole (PROTONIX) 40 MG tablet Take 40 mg by mouth daily.     Probiotic Product (PROBIOTIC  PO) Take by mouth daily.     vitamin E 180 MG (400 UNITS) capsule Take 400 Units by mouth daily.     No current facility-administered medications for this visit.      Marland Kitchen  PHYSICAL EXAMINATION: ECOG PERFORMANCE STATUS: 0 - Asymptomatic  Vitals:   05/08/23 1426 05/08/23 1506  BP: (!) 173/94 (!) 185/83  Pulse:    Temp:    SpO2:       Filed Weights   05/08/23 1414  Weight: 149 lb 6.4 oz (67.8 kg)      Physical Exam Constitutional:      Comments: Patient is alone.  Ambulating dependently.  HENT:     Head: Normocephalic and atraumatic.     Mouth/Throat:     Pharynx: No oropharyngeal exudate.  Eyes:     Pupils: Pupils are equal, round, and reactive to light.  Cardiovascular:     Rate and Rhythm: Normal rate and regular rhythm.  Pulmonary:     Effort: Pulmonary effort is normal. No respiratory distress.     Breath sounds: Normal breath sounds. No wheezing.  Abdominal:     General: Bowel sounds are normal. There is no distension.     Palpations: Abdomen is soft. There is no mass.     Tenderness: There is no abdominal tenderness. There is no guarding or rebound.  Musculoskeletal:        General: No tenderness. Normal range of motion.     Cervical back: Normal range of motion and neck supple.  Skin:    General: Skin is warm.  Neurological:     Mental Status: She is alert and oriented to person, place, and time.  Psychiatric:        Mood and Affect: Affect normal.    LABORATORY DATA:  I have reviewed the data as listed Lab Results  Component Value Date   WBC 4.1 05/08/2023   HGB 9.9 (L) 05/08/2023   HCT 29.7 (L) 05/08/2023   MCV 93.1 05/08/2023   PLT 177 05/08/2023   Recent Labs    05/23/22 1242 05/24/22 0519 11/04/22 1428 12/30/22 1409 05/08/23 1408  NA 138   < > 142 140 140  K 3.4*   < > 4.3 4.3 4.3  CL 101   < > 109 105 103  CO2 25   < > 22 23 25   GLUCOSE 180*   < > 207* 327* 330*  BUN 29*   < > 34* 37* 31*  CREATININE 0.97   < > 1.10* 1.08* 1.00   CALCIUM 10.2   < > 9.2 9.5 9.2  GFRNONAA 57*   < > 49* 50* 55*  PROT 7.6  --   --   --   --   ALBUMIN 4.0  --   --   --   --   AST 10*  --   --   --   --   ALT 11  --   --   --   --   ALKPHOS 90  --   --   --   --   BILITOT 0.8  --   --   --   --    < > = values in this interval not displayed.    RADIOGRAPHIC STUDIES: I have personally reviewed the radiological images as listed and agreed with the findings in the report. MR HIP LEFT WO CONTRAST  Result Date: 05/02/2023 CLINICAL DATA:  Left hip pain. EXAM: MR OF THE LEFT HIP WITHOUT CONTRAST TECHNIQUE: Multiplanar, multisequence MR imaging was performed. No intravenous contrast was administered. COMPARISON:  CT abdomen and pelvis 05/23/2022 FINDINGS: Bones: Mild pubic symphysis joint space narrowing and peripheral osteophytosis. Severe right L4-5 and moderate to severe right L5-S1 disc space narrowing and peripheral endplate osteophytes. Articular cartilage and labrum Left hip: Cartilage: There is focal high-grade subchondral marrow edema within the superior, mid to anterior left femoral head (coronal series 12, image 10 and sagittal series 11, image 14). Minimal overlying cortical flattening. Moderate to high-grade superior left femoral head and acetabular cartilage thinning. Mild-to-moderate femoral head-neck junction and peripheral acetabular degenerative osteophytes. Labrum: Mild attenuation of the superior left glenoid labrum with high-grade attenuation the and degenerative irregularity of the anterosuperior left acetabular labrum. On large field-of-view images, there is might high-grade thinning of the superior right femoroacetabular cartilage. Mild right femoral head-neck junction circumferential degenerative osteophytes, less severe than the left side. Joint or bursal effusion Joint effusion:  No  joint effusion within either hip. Bursae: Mild fluid within the left trochanteric bursa. Muscles and tendons Muscles and tendons: Origins of the  lateral rectus femoris tendons are intact. Mild-to-moderate fluid within the midsubstance of the bilateral common hamstring tendon origins, mild-to-moderate tenosynovitis. No tendon retraction. Partial-thickness tear of the posterior aspect of the left gluteus minimus tendon insertion. The right gluteus minimus and bilateral gluteus medius tendon insertions are intact. Other findings Miscellaneous: The rectus abdominis-adductor aponeuroses are intact. Moderate to high-grade sigmoid diverticulosis without inflammatory changes to indicate acute diverticulitis. The uterus is surgically absent. IMPRESSION: 1. Focal high-grade subchondral marrow edema within the superior, mid to anterior left femoral head with minimal overlying cortical flattening. This is compatible with an acute to subacute subchondral insufficiency fracture. 2. Moderate to high-grade left femoroacetabular cartilage thinning. 3. Partial-thickness tear of the posterior aspect of the left gluteus minimus tendon insertion. 4. Mild left trochanteric bursitis. 5. Mild-to-moderate bilateral common hamstring tendon origin tenosynovitis. 6. Severe right L4-5 and moderate to severe right L5-S1 degenerative disc and endplate changes. Electronically Signed   By: Neita Garnet M.D.   On: 05/02/2023 19:53    ASSESSMENT & PLAN:   Symptomatic anemia  #Chronic mild anemia-hemoglobin  10-11; MAY 2024- Iron sat-15% ; ferritin- 169  Not totally consistent with iron deficiency- ? CKD stage III-  # MAY 2024-bone marrow biopsy-not suggestive of myelodysplastic syndrome.  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 MAY 2024-S/p EGD/coloscopy [Dr.Toledo];   # Today hemoglobin is 9.9  -recommend proceeding with iron infusion today  Discuss the possible use of Retacrit at subsequent visits if the hemoglobin is less than 10. MAY 2024-I sat-13- ;  today- add iron studies/ferritin   # ITP/ Thrombocytopenia-platelet count- > 100 -stable. on surveillance.  # Elevated HTN- keep  a log BPs; Repeat 170/90. And defer to PCP, Dr.Hande.  stable.   # CKD stage III- monitor closely with BP. Hydration.   # DISPOSITION: # add iron studies/ferritin # venofer today; no retacrit # follow up in 4 months  MD: labs- cbc/bmp; LDH---possible Venofer OR Retacrit- -Dr.B    Earna Coder, MD 05/08/2023 3:59 PM

## 2023-05-08 NOTE — Patient Instructions (Signed)
Iron Sucrose Injection What is this medication? IRON SUCROSE (EYE ern SOO krose) treats low levels of iron (iron deficiency anemia) in people with kidney disease. Iron is a mineral that plays an important role in making red blood cells, which carry oxygen from your lungs to the rest of your body. This medicine may be used for other purposes; ask your health care provider or pharmacist if you have questions. COMMON BRAND NAME(S): Venofer What should I tell my care team before I take this medication? They need to know if you have any of these conditions: Anemia not caused by low iron levels Heart disease High levels of iron in the blood Kidney disease Liver disease An unusual or allergic reaction to iron, other medications, foods, dyes, or preservatives Pregnant or trying to get pregnant Breastfeeding How should I use this medication? This medication is for infusion into a vein. It is given in a hospital or clinic setting. Talk to your care team about the use of this medication in children. While this medication may be prescribed for children as young as 2 years for selected conditions, precautions do apply. Overdosage: If you think you have taken too much of this medicine contact a poison control center or emergency room at once. NOTE: This medicine is only for you. Do not share this medicine with others. What if I miss a dose? Keep appointments for follow-up doses. It is important not to miss your dose. Call your care team if you are unable to keep an appointment. What may interact with this medication? Do not take this medication with any of the following: Deferoxamine Dimercaprol Other iron products This medication may also interact with the following: Chloramphenicol Deferasirox This list may not describe all possible interactions. Give your health care provider a list of all the medicines, herbs, non-prescription drugs, or dietary supplements you use. Also tell them if you smoke,  drink alcohol, or use illegal drugs. Some items may interact with your medicine. What should I watch for while using this medication? Visit your care team regularly. Tell your care team if your symptoms do not start to get better or if they get worse. You may need blood work done while you are taking this medication. You may need to follow a special diet. Talk to your care team. Foods that contain iron include: whole grains/cereals, dried fruits, beans, or peas, leafy green vegetables, and organ meats (liver, kidney). What side effects may I notice from receiving this medication? Side effects that you should report to your care team as soon as possible: Allergic reactions--skin rash, itching, hives, swelling of the face, lips, tongue, or throat Low blood pressure--dizziness, feeling faint or lightheaded, blurry vision Shortness of breath Side effects that usually do not require medical attention (report to your care team if they continue or are bothersome): Flushing Headache Joint pain Muscle pain Nausea Pain, redness, or irritation at injection site This list may not describe all possible side effects. Call your doctor for medical advice about side effects. You may report side effects to FDA at 1-800-FDA-1088. Where should I keep my medication? This medication is given in a hospital or clinic. It will not be stored at home. NOTE: This sheet is a summary. It may not cover all possible information. If you have questions about this medicine, talk to your doctor, pharmacist, or health care provider.  2024 Elsevier/Gold Standard (2022-10-28 00:00:00)

## 2023-05-08 NOTE — Assessment & Plan Note (Addendum)
#  Chronic mild anemia-hemoglobin  10-11; MAY 2024- Iron sat-15% ; ferritin- 169  Not totally consistent with iron deficiency- ? CKD stage III-  # MAY 2024-bone marrow biopsy-not suggestive of myelodysplastic syndrome.  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 MAY 2024-S/p EGD/coloscopy [Dr.Toledo];   # Today hemoglobin is 9.9  -recommend proceeding with iron infusion today  Discuss the possible use of Retacrit at subsequent visits if the hemoglobin is less than 10. MAY 2024-I sat-13- ;  today- add iron studies/ferritin   # ITP/ Thrombocytopenia-platelet count- > 100 -stable. on surveillance.  # Elevated HTN- keep a log BPs; Repeat 170/90. And defer to PCP, Dr.Hande.  stable.   # CKD stage III- monitor closely with BP. Hydration.   # DISPOSITION: # add iron studies/ferritin # venofer today; no retacrit # follow up in 4 months  MD: labs- cbc/bmp; LDH---possible Venofer OR Retacrit- -Dr.B

## 2023-05-08 NOTE — Progress Notes (Signed)
Fractured left hip, no injury. Seeing Kim Ballard 05/10/23. 10/10. MRI [facility].  Being treated for a kidney infection, taking keflex.  Fatigue/weakness: yes Dyspena: no Light headedness: yes Blood in stool: no  Had a rt ear skin cancer removed, melanoma.

## 2023-07-13 ENCOUNTER — Other Ambulatory Visit: Payer: Self-pay | Admitting: Family Medicine

## 2023-07-13 DIAGNOSIS — M4807 Spinal stenosis, lumbosacral region: Secondary | ICD-10-CM

## 2023-07-16 ENCOUNTER — Encounter: Payer: Self-pay | Admitting: Internal Medicine

## 2023-07-16 ENCOUNTER — Ambulatory Visit (INDEPENDENT_AMBULATORY_CARE_PROVIDER_SITE_OTHER): Payer: Medicare Other

## 2023-07-16 ENCOUNTER — Ambulatory Visit
Admission: EM | Admit: 2023-07-16 | Discharge: 2023-07-16 | Disposition: A | Discharge status: Home / Self Care | Payer: Medicare Other | Attending: Emergency Medicine | Admitting: Emergency Medicine

## 2023-07-16 ENCOUNTER — Encounter: Payer: Self-pay | Admitting: Emergency Medicine

## 2023-07-16 DIAGNOSIS — R051 Acute cough: Secondary | ICD-10-CM | POA: Diagnosis not present

## 2023-07-16 DIAGNOSIS — J09X2 Influenza due to identified novel influenza A virus with other respiratory manifestations: Secondary | ICD-10-CM | POA: Insufficient documentation

## 2023-07-16 LAB — RESP PANEL BY RT-PCR (RSV, FLU A&B, COVID)  RVPGX2
Influenza A by PCR: POSITIVE — AB
Influenza B by PCR: NEGATIVE
Resp Syncytial Virus by PCR: NEGATIVE
SARS Coronavirus 2 by RT PCR: NEGATIVE

## 2023-07-16 MED ORDER — ACETAMINOPHEN 325 MG PO TABS
650.0000 mg | ORAL_TABLET | Freq: Once | ORAL | Status: AC
Start: 2023-07-16 — End: 2023-07-16
  Administered 2023-07-16: 650 mg via ORAL

## 2023-07-16 MED ORDER — PROMETHAZINE-DM 6.25-15 MG/5ML PO SYRP: 5.0000 mL | ORAL_SOLUTION | Freq: Four times a day (QID) | ORAL | 0 refills | Status: DC | PRN

## 2023-07-16 MED ORDER — IPRATROPIUM BROMIDE 0.06 % NA SOLN
2.0000 | Freq: Four times a day (QID) | NASAL | 12 refills | Status: DC
Start: 2023-07-16 — End: 2023-09-06

## 2023-07-16 MED ORDER — OSELTAMIVIR PHOSPHATE 75 MG PO CAPS: 75.0000 mg | ORAL_CAPSULE | Freq: Two times a day (BID) | ORAL | 0 refills | Status: DC

## 2023-07-16 MED ORDER — BENZONATATE 100 MG PO CAPS: 200.0000 mg | ORAL_CAPSULE | Freq: Three times a day (TID) | ORAL | 0 refills | Status: DC

## 2023-07-16 NOTE — ED Provider Notes (Signed)
 [facility]    CSN: [id number] Arrival date & time: 07/16/23  1045      History   Chief Complaint Chief Complaint  Patient presents with   Nasal Congestion    HPI Kim Ballard is a 89 y.o. female.   HPI  89 year old female with past medical history significant for diabetes, GERD, fibromyalgia, hypertension, melanoma, and IBS presents for evaluation of respiratory symptoms that started 2 days ago and consist of fever with a Tmax of 100.1, runny nose, nasal congestion, postnasal drip, nonproductive cough, and wheezing.  She also endorses body aches.  She denies any sore throat, ear pain, shortness of breath.  She reports that she needs a Z-Pak because she always gets bronchitis.  Past Medical History:  Diagnosis Date   Allergic state    Anemia    Arthritis    Cancer (HCC)    skin cancers   Chronic cystitis with hematuria    Complication of anesthesia 10/28/2022   Excessive night sweats and itching after procedure 10/28/22   Depression    Diabetes mellitus without complication (HCC)    Fibrocystic disease of both breasts    Fibromyalgia    GERD (gastroesophageal reflux disease)    Hypertension    IBS (irritable bowel syndrome)    Melanoma (HCC) 07/09/2021   lt neck/shoulder area   Melanoma of neck (HCC) 09/06/2021   left   Nausea    Skin cancer    Wears dentures    full upper and lower    Patient Active Problem List   Diagnosis Date Noted   Anemia due to stage 3a chronic kidney disease (HCC) 11/04/2022   Hypertension 07/26/2022   Depression 07/26/2022   Diabetes mellitus without complication (HCC) 05/23/2022   Hypokalemia 05/23/2022   Right hip pain 05/23/2022   Hypomagnesemia 05/23/2022   Swelling of left lower extremity 11/19/2021   Melanoma of neck (HCC) 07/01/2021   Moderate episode of recurrent major depressive disorder (HCC) 11/05/2020   Irritable bowel syndrome with constipation and diarrhea 11/05/2020   Sensorineural hearing loss (SNHL)  of both ears 07/29/2020   Osteopenia 01/30/2020   Neuropathic pain of both feet 01/30/2020   Melanoma of back (HCC) 01/30/2020   Iron  deficiency 08/13/2019   Recurrent UTI 07/01/2019   Hx of adenomatous colonic polyps 01/22/2019   Esophageal dysphagia 01/21/2019   Fatigue 09/18/2017   Leukopenia 04/26/2016   Pancytopenia (HCC) 04/26/2016   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   Hyperlipidemia 02/19/2016   Diabetes mellitus (HCC) 02/19/2016   Iron  deficiency anemia 02/08/2016   Symptomatic anemia 01/18/2016   Thrombocytopenia (HCC) 01/18/2016   Gastroesophageal reflux disease without esophagitis 07/30/2015   Urge incontinence 05/14/2015   Other chronic cystitis without hematuria 05/14/2015    Past Surgical History:  Procedure Laterality Date   ABDOMINAL HYSTERECTOMY     APPENDECTOMY     BREAST BIOPSY Right 2001   surgical bx    CATARACT EXTRACTION W/PHACO Right 03/08/2017   Procedure: CATARACT EXTRACTION PHACO AND INTRAOCULAR LENS PLACEMENT (IOC) RIGHT DIABETIC TORIC;  Surgeon: Mittie Gaskin, MD;  Location: [facility];  Service: Ophthalmology;  Laterality: Right;  Diabetic - oral meds   CATARACT EXTRACTION W/PHACO Left 04/17/2017   Procedure: CATARACT EXTRACTION PHACO AND INTRAOCULAR LENS PLACEMENT (IOC) LEFT DIABETIC;  Surgeon: Mittie Gaskin, MD;  Location: [facility];  Service: Ophthalmology;  Laterality: Left;  diabetic-oral meds   CHOLECYSTECTOMY     COLONOSCOPY     COLONOSCOPY N/A 10/05/2022  Procedure: COLONOSCOPY;  Surgeon: Toledo, Ladell POUR, MD;  Location: [facility];  Service: Gastroenterology;  Laterality: N/A;   COLONOSCOPY WITH PROPOFOL  N/A 08/27/2015   Procedure: COLONOSCOPY WITH PROPOFOL ;  Surgeon: Lamar ONEIDA Holmes, MD;  Location: [facility] END[facility];  Service: Endoscopy;  Laterality: N/A;   COLONOSCOPY WITH PROPOFOL  N/A 05/02/2016   Procedure: COLONOSCOPY WITH PROPOFOL ;  Surgeon: Lamar ONEIDA Holmes, MD;  Location: [facility] END[facility]COPY;   Service: Endoscopy;  Laterality: N/A;   ESOPHAGOGASTRODUODENOSCOPY     ESOPHAGOGASTRODUODENOSCOPY N/A 10/05/2022   Procedure: ESOPHAGOGASTRODUODENOSCOPY (EGD);  Surgeon: Toledo, Ladell POUR, MD;  Location: [facility];  Service: Gastroenterology;  Laterality: N/A;   ESOPHAGOGASTRODUODENOSCOPY (EGD) WITH PROPOFOL  N/A 08/27/2015   Procedure: ESOPHAGOGASTRODUODENOSCOPY (EGD) WITH PROPOFOL ;  Surgeon: Lamar ONEIDA Holmes, MD;  Location: [facility] END[facility]COPY;  Service: Endoscopy;  Laterality: N/A;   EYE SURGERY     HERNIA REPAIR     IR BONE MARROW BIOPSY & ASPIRATION  10/28/2022   MICROLARYNGOSCOPY Right 11/11/2022   Procedure: MICROLARYNGOSCOPY WITH BIOPSY OF PYRIFORM SINUS MASS;  Surgeon: Herminio Miu, MD;  Location: [facility];  Service: ENT;  Laterality: Right;  Diabetic    OB History   No obstetric history on file.      Home Medications    Prior to Admission medications   Medication Sig Start Date End Date Taking? Authorizing Provider  Ascorbic Acid (VITAMIN C) 1000 MG tablet Take 1,000 mg by mouth 2 (two) times daily.   Yes [provider]  benzonatate  (TESSALON ) 100 MG capsule Take 2 capsules (200 mg total) by mouth every 8 (eight) hours. 07/16/23  Yes Bernardino Ditch, NP  cholecalciferol  (VITAMIN D3) 25 MCG (1000 UT) tablet Take 1,000 Units by mouth 2 (two) times daily.   Yes [provider]  cyanocobalamin  (VITAMIN B12) 1000 MCG tablet Take 1,000 mcg by mouth daily.   Yes [provider]  fluticasone (FLONASE) 50 MCG/ACT nasal spray Place 2 sprays into both nostrils daily as needed for allergies.   Yes [provider]  gemfibrozil  (LOPID ) 600 MG tablet Take 600 mg by mouth 2 (two) times daily before a meal.   Yes [provider]  glipiZIDE (GLUCOTROL XL) 5 MG 24 hr tablet Take 10 mg by mouth in the morning and at bedtime.   Yes [provider]  hydrochlorothiazide  (HYDRODIURIL ) 25 MG tablet Take 25 mg by mouth daily.   Yes [provider]  ipratropium (ATROVENT ) 0.06 % nasal spray Place 2 sprays into both nostrils 4 (four) times daily. 07/16/23  Yes Bernardino Ditch, NP  Multiple Vitamins-Minerals (HAIR SKIN & NAILS PO) Take by mouth daily.   Yes [provider]  Multiple Vitamins-Minerals (MULTIVITAMIN WITH MINERALS) tablet Take 1 tablet by mouth daily.   Yes [provider]  oseltamivir  (TAMIFLU ) 75 MG capsule Take 1 capsule (75 mg total) by mouth every 12 (twelve) hours. 07/16/23  Yes Bernardino Ditch, NP  pantoprazole  (PROTONIX ) 40 MG tablet Take 40 mg by mouth daily.   Yes [provider]  promethazine -dextromethorphan (PROMETHAZINE -DM) 6.25-15 MG/5ML syrup Take 5 mLs by mouth 4 (four) times daily as needed. 07/16/23  Yes Bernardino Ditch, NP  iron  polysaccharides (NIFEREX) 150 MG capsule Take 150 mg by mouth daily.    [provider]  neomycin -polymyxin-hydrocortisone (CORTISPORIN) 3.5-10000-1 OTIC suspension Place 4 drops into the left ear 3 (three) times daily. 12/20/22   White, Adrienne R, NP  Omega 3 1000 MG CAPS Take 1,000 mg by mouth daily.    [provider]  Probiotic Product (PROBIOTIC PO) Take by mouth daily.    [provider]  vitamin E 180 MG (400 UNITS) capsule Take 400 Units by mouth daily.    [provider]    Family History Family History  Problem Relation Age of Onset   Hypertension Mother    Ovarian cancer Paternal Grandmother    COPD Father    Hyperlipidemia Father    Kidney disease Father    Diabetes Maternal Grandmother    Breast cancer Neg Hx     Social History Social History   Tobacco Use   Smoking status: Former    Current packs/day: 0.00    Types: Cigarettes    Quit date: 1960    Years since quitting: 65.1   Smokeless tobacco: Never  Vaping Use   Vaping status: Never Used  Substance Use Topics   Alcohol use: No   Drug use: No     Allergies   Biaxin [clarithromycin], Brompheniramine maleate, Ceclor [cefaclor],  Ciprofloxacin, Clinoril [sulindac], Codeine sulfate, Diovan [valsartan], Doxycycline, Erythromycin, Esomeprazole, Fluoxetine, Guaifenesin & derivatives, Levaquin [levofloxacin in d5w], Lisinopril, Lodine [etodolac], Macrodantin [nitrofurantoin macrocrystal], Medrol  [methylprednisolone ], Mobic [meloxicam], Motrin [ibuprofen], Nexium [esomeprazole magnesium ], Nitrofurantoin, Norvasc [amlodipine besylate], Omnicef [cefdinir], Penicillins, Prednisone , Prozac [fluoxetine hcl], Pseudoephedrine, Reglan [metoclopramide], Seldane [terfenadine], Sulfa antibiotics, Toprol xl [metoprolol tartrate], Triamterene, Tussionex pennkinetic er [hydrocod poli-chlorphe poli er], Vibramycin [doxycycline calcium], and Amlodipine   Review of Systems Review of Systems  Constitutional:  Positive for fever.  HENT:  Positive for congestion, postnasal drip and rhinorrhea. Negative for ear pain and sore throat.   Respiratory:  Positive for cough and wheezing. Negative for shortness of breath.   Musculoskeletal:  Positive for arthralgias and myalgias.     Physical Exam Triage Vital Signs ED Triage Vitals  Encounter Vitals Group     BP      Systolic BP Percentile      Diastolic BP Percentile      Pulse      Resp      Temp      Temp src      SpO2      Weight      Height      Head Circumference      Peak Flow      Pain Score      Pain Loc      Pain Education      Exclude from Growth Chart    No data found.  Updated Vital Signs BP (!) 178/69 (BP Location: Left Arm)   Pulse (!) 106   Temp (!) 100.5 F (38.1 C) (Oral)   Resp 16   Ht 5' 7 (1.702 m)   Wt 149 lb 7.6 oz (67.8 kg)   SpO2 98%   BMI 23.41 kg/m   Visual Acuity Right Eye Distance:   Left Eye Distance:   Bilateral Distance:    Right Eye Near:   Left Eye Near:    Bilateral Near:     Physical Exam Vitals and nursing note reviewed.  Constitutional:      Appearance: Normal appearance. She is not ill-appearing.  HENT:     Head:  Normocephalic and atraumatic.     Right Ear: Tympanic membrane, ear canal and external ear normal. There is no impacted cerumen.     Left Ear: Tympanic membrane, ear canal and external ear normal. There is no impacted cerumen.     Nose: Congestion and rhinorrhea present.     Comments: Nasal mucosa is erythematous  and edematous with clear discharge in both nares.    Mouth/Throat:     Mouth: Mucous membranes are moist.     Pharynx: Oropharynx is clear. No oropharyngeal exudate or posterior oropharyngeal erythema.  Cardiovascular:     Rate and Rhythm: Normal rate and regular rhythm.     Pulses: Normal pulses.     Heart sounds: Normal heart sounds. No murmur heard.    No friction rub. No gallop.  Pulmonary:     Effort: Pulmonary effort is normal.     Breath sounds: Normal breath sounds. No wheezing, rhonchi or rales.  Musculoskeletal:     Cervical back: Normal range of motion and neck supple. No tenderness.  Lymphadenopathy:     Cervical: No cervical adenopathy.  Skin:    General: Skin is warm and dry.     Capillary Refill: Capillary refill takes less than 2 seconds.     Findings: No erythema or rash.  Neurological:     General: No focal deficit present.     Mental Status: She is alert and oriented to person, place, and time.      UC Treatments / Results  Labs (all labs ordered are listed, but only abnormal results are displayed) Labs Reviewed  RESP PANEL BY RT-PCR (RSV, FLU A&B, COVID)  [id number] - Abnormal; Notable for the following components:      Result Value   Influenza A by PCR POSITIVE (*)    All other components within normal limits    EKG   Radiology DG Chest 2 View Result Date: 07/16/2023 CLINICAL DATA:  Cough and wheezing.  Fever. EXAM: CHEST - 2 VIEW COMPARISON:  Chest radiograph dated 05/23/2022. FINDINGS: No focal consolidation, pleural effusion, or pneumothorax. The cardiac silhouette is within limits. Atherosclerotic calcification of the aorta. Osteopenia with  degenerative changes of the spine. No acute osseous pathology. Similar positioning of a ring like structure over the GE junction. IMPRESSION: No active cardiopulmonary disease. Electronically Signed   By: Vanetta Chou M.D.   On: 07/16/2023 13:23    Procedures Procedures (including critical care time)  Medications Ordered in UC Medications  acetaminophen  (TYLENOL ) tablet 650 mg (650 mg Oral Given 07/16/23 1249)    Initial Impression / Assessment and Plan / UC Course  I have reviewed the triage vital signs and the nursing notes.  Pertinent labs & imaging results that were available during my care of the patient were reviewed by me and considered in my medical decision making (see chart for details).   Patient is a nontoxic-appearing 89 year old female presenting for evaluation of 2 days with respiratory symptoms as outlined HPI above.  In the exam room she is able to speak in full sentence with dyspnea or tachypnea.  Rester rate at triage was 16 with a room air oxygen saturation of 98%.  She is febrile with an oral temp of 100.5 and 975 mg of Tylenol  has been ordered.  Her physical exam does reveal inflammation of her upper respiratory tract with inflamed nasal mucosa and clear rhinorrhea.  Oropharyngeal and otoscopic exams are both benign.  Cardiopulmonary exam reveals clear lung sounds in all fields.  Differential diagnosis include COVID, influenza, RSV, viral respiratory infection.  The patient reports that she always gets bronchitis and is requesting a Z-Pak.  I will order a respiratory panel today for the presence of COVID, influenza, and RSV and a chest x-ray to evaluate for any acute cardiopulmonary process.  Chest x-ray independently reviewed and evaluated by me.  Impression:  Lung fields are well aerated without evidence for treatment or effusion.  Cardiomediastinal silhouette appears normal.  Radiology overread is pending. Radiology impression states no active cardiopulmonary  disease.  Respiratory panel is positive for influenza A.  I will discharge patient on the diagnosis of influenza A and start her on Tamiflu  75 mg twice daily for 10 days.  Atrovent  nasal spray to help with nasal congestion.  Tessalon  Perles and Promethazine  DM cough syrup for cough and congestion.  Over-the-counter Tylenol  and/or ibuprofen as needed for fever or pain.  Return precautions reviewed.  Final Clinical Impressions(s) / UC Diagnoses   Final diagnoses:  Acute cough  Influenza due to identified novel influenza A virus with other respiratory manifestations     Discharge Instructions      Take the Tamiflu  twice daily for 5 days for treatment of influenza.  Use over-the-counter Tylenol  and/or ibuprofen according the package instructions as needed for any fever or pain.  Use the Atrovent  nasal spray, 2 squirts up each nostril every 6 hours, as needed for nasal congestion and runny nose.  Use over-the-counter Delsym, Zarbee's, or Robitussin during the day as needed for cough.  Use the Tessalon  Perles every 8 hours as needed for cough.  Taken with a small sip of water.  You may experience some numbness to your tongue or metallic taste in your mouth, this is normal.  Use the Promethazine  DM cough syrup at bedtime as will make you drowsy but it should help dry up your postnasal drip and aid you in sleep and cough relief.  Return for reevaluation, or see your primary care provider, for new or worsening symptoms.      ED Prescriptions     Medication Sig Dispense Auth. Provider   benzonatate  (TESSALON ) 100 MG capsule Take 2 capsules (200 mg total) by mouth every 8 (eight) hours. 21 capsule Bernardino Ditch, NP   ipratropium (ATROVENT ) 0.06 % nasal spray Place 2 sprays into both nostrils 4 (four) times daily. 15 mL Bernardino Ditch, NP   promethazine -dextromethorphan (PROMETHAZINE -DM) 6.25-15 MG/5ML syrup Take 5 mLs by mouth 4 (four) times daily as needed. 118 mL Bernardino Ditch, NP    oseltamivir  (TAMIFLU ) 75 MG capsule Take 1 capsule (75 mg total) by mouth every 12 (twelve) hours. 10 capsule Bernardino Ditch, NP      PDMP not reviewed this encounter.   Bernardino Ditch, NP 07/16/23 1351

## 2023-07-16 NOTE — Discharge Instructions (Addendum)
 Take the Tamiflu twice daily for 5 days for treatment of influenza.  Use over-the-counter Tylenol and/or ibuprofen according the package instructions as needed for any fever or pain.  Use the Atrovent nasal spray, 2 squirts up each nostril every 6 hours, as needed for nasal congestion and runny nose.  Use over-the-counter Delsym, Zarbee's, or Robitussin during the day as needed for cough.  Use the Tessalon Perles every 8 hours as needed for cough.  Taken with a small sip of water.  You may experience some numbness to your tongue or metallic taste in your mouth, this is normal.  Use the Promethazine DM cough syrup at bedtime as will make you drowsy but it should help dry up your postnasal drip and aid you in sleep and cough relief.  Return for reevaluation, or see your primary care provider, for new or worsening symptoms.

## 2023-07-16 NOTE — ED Triage Notes (Signed)
 Pt c/o nasal congestion, cough, body aches. Started about 2 days ago. She states if she doesn't get an antibiotic it will turn into bronchitis.

## 2023-08-29 ENCOUNTER — Other Ambulatory Visit: Payer: Self-pay | Admitting: Internal Medicine

## 2023-09-06 ENCOUNTER — Inpatient Hospital Stay: Payer: Medicare HMO | Attending: Internal Medicine

## 2023-09-06 ENCOUNTER — Encounter: Payer: Self-pay | Admitting: Internal Medicine

## 2023-09-06 ENCOUNTER — Inpatient Hospital Stay: Payer: Medicare HMO

## 2023-09-06 ENCOUNTER — Inpatient Hospital Stay: Payer: Medicare HMO | Admitting: Internal Medicine

## 2023-09-06 VITALS — BP 196/86 | HR 78 | Temp 97.6°F | Resp 18 | Ht 67.0 in | Wt 146.6 lb

## 2023-09-06 DIAGNOSIS — D509 Iron deficiency anemia, unspecified: Secondary | ICD-10-CM

## 2023-09-06 DIAGNOSIS — Z79899 Other long term (current) drug therapy: Secondary | ICD-10-CM | POA: Diagnosis not present

## 2023-09-06 DIAGNOSIS — D631 Anemia in chronic kidney disease: Secondary | ICD-10-CM

## 2023-09-06 DIAGNOSIS — D649 Anemia, unspecified: Secondary | ICD-10-CM

## 2023-09-06 DIAGNOSIS — D508 Other iron deficiency anemias: Secondary | ICD-10-CM

## 2023-09-06 LAB — LACTATE DEHYDROGENASE: LDH: 148 U/L (ref 98–192)

## 2023-09-06 LAB — CBC WITH DIFFERENTIAL (CANCER CENTER ONLY)
Abs Immature Granulocytes: 0.02 10*3/uL (ref 0.00–0.07)
Basophils Absolute: 0 10*3/uL (ref 0.0–0.1)
Basophils Relative: 1 %
Eosinophils Absolute: 0.2 10*3/uL (ref 0.0–0.5)
Eosinophils Relative: 4 %
HCT: 31 % — ABNORMAL LOW (ref 36.0–46.0)
Hemoglobin: 10 g/dL — ABNORMAL LOW (ref 12.0–15.0)
Immature Granulocytes: 0 %
Lymphocytes Relative: 19 %
Lymphs Abs: 0.9 10*3/uL (ref 0.7–4.0)
MCH: 30 pg (ref 26.0–34.0)
MCHC: 32.3 g/dL (ref 30.0–36.0)
MCV: 93.1 fL (ref 80.0–100.0)
Monocytes Absolute: 0.3 10*3/uL (ref 0.1–1.0)
Monocytes Relative: 7 %
Neutro Abs: 3.2 10*3/uL (ref 1.7–7.7)
Neutrophils Relative %: 69 %
Platelet Count: 124 10*3/uL — ABNORMAL LOW (ref 150–400)
RBC: 3.33 MIL/uL — ABNORMAL LOW (ref 3.87–5.11)
RDW: 14.3 % (ref 11.5–15.5)
WBC Count: 4.6 10*3/uL (ref 4.0–10.5)
nRBC: 0 % (ref 0.0–0.2)

## 2023-09-06 LAB — BASIC METABOLIC PANEL - CANCER CENTER ONLY
Anion gap: 9 (ref 5–15)
BUN: 33 mg/dL — ABNORMAL HIGH (ref 8–23)
CO2: 23 mmol/L (ref 22–32)
Calcium: 8.8 mg/dL — ABNORMAL LOW (ref 8.9–10.3)
Chloride: 106 mmol/L (ref 98–111)
Creatinine: 1.04 mg/dL — ABNORMAL HIGH (ref 0.44–1.00)
GFR, Estimated: 52 mL/min — ABNORMAL LOW (ref >60)
Glucose, Bld: 182 mg/dL — ABNORMAL HIGH (ref 70–99)
Potassium: 4.3 mmol/L (ref 3.5–5.1)
Sodium: 138 mmol/L (ref 135–145)

## 2023-09-06 MED ORDER — IRON SUCROSE 20 MG/ML IV SOLN
200.0000 mg | Freq: Once | INTRAVENOUS | Status: AC
  Administered 2023-09-06: 200 mg via INTRAVENOUS

## 2023-09-06 NOTE — Progress Notes (Signed)
 [facility] CONSULT NOTE  Patient Care Team: Barbette Reichmann, MD as PCP - General (Internal Medicine) Barbette Reichmann, MD as Physician Assistant (Internal Medicine) Rosey Bath, MD (Inactive) as Consulting Physician (Hematology and Oncology) Earna Coder, MD as Consulting Physician (Oncology)  CHIEF COMPLAINTS/PURPOSE OF CONSULTATION: Iron deficiency anemia/thrombocytopenia  #Chronic [2015] mild anemia hemoglobin 11.1-question iron deficiency versus others.-Dr. Elliott/Dr. Merlene Pulling; s/p Venofer. MAY 2024- BONE MARROW, ASPIRATE, CLOT, CORE:  - Normocellular bone marrow (20%) with trilineage hematopoiesis and no  increase in blasts.  Morphologic evaluation and immunohistochemical analysis do not provide a  precise explanation for the patient's anemia and thrombocytopenia. The  absence of significant morphologic dysplasia makes a myelodysplastic  syndrome less likely and secondary causes should be considered; however,  correlation with pending cytogenetic and molecular studies will be of  interest to exclude a morphologically subtle evolving myeloid neoplasm.   #Mild thrombocytopenia platelets 120s  #Fatigue/ PN/DM; MELANOMA of mid back [May 2021] s/p excision. MELANOMA LEFT neck posterior-s/p excision.[JAN 2023 ]  Oncology History   No history exists.    HISTORY OF PRESENTING ILLNESS: Alone.  Ambulating independently.  Kim Ballard 89 y.o.  female longstanding history of iron deficient anemia/thrombocytopenia- chronic fatigue is here for follow-up.    Since last visit she has had covid, skin cancer removed from right arm, treated by Dr. Roseanne Kaufman, back pain treated by Dr. Carrolyn Leigh, had an epidural steroid injection.   C/o nausea, when she eats her stomach bloats x 2 months.  Appetite 50% normal, 1 premier drink a day.  Patient complains on going abdominal bloating.   Review of Systems  Constitutional:  Positive for malaise/fatigue.  Negative for chills, diaphoresis, fever and weight loss.  HENT:  Negative for nosebleeds and sore throat.   Eyes:  Negative for double vision.  Respiratory:  Negative for cough, hemoptysis, sputum production, shortness of breath and wheezing.   Cardiovascular:  Negative for chest pain, palpitations, orthopnea and leg swelling.  Gastrointestinal:  Positive for nausea. Negative for blood in stool, constipation, diarrhea, heartburn, melena and vomiting.  Musculoskeletal:  Negative for back pain and joint pain.  Skin: Negative.  Negative for itching and rash.  Neurological:  Positive for tingling. Negative for dizziness, focal weakness, weakness and headaches.  Endo/Heme/Allergies:  Does not bruise/bleed easily.  Psychiatric/Behavioral:  Negative for depression. The patient is not nervous/anxious and does not have insomnia.      MEDICAL HISTORY:  Past Medical History:  Diagnosis Date   Allergic state    Anemia    Arthritis    Cancer (HCC)    skin cancers   Chronic cystitis with hematuria    Complication of anesthesia 10/28/2022   Excessive night sweats and itching after procedure 10/28/22   Depression    Diabetes mellitus without complication (HCC)    Fibrocystic disease of both breasts    Fibromyalgia    GERD (gastroesophageal reflux disease)    Hypertension    IBS (irritable bowel syndrome)    Melanoma (HCC) 07/09/2021   lt neck/shoulder area   Melanoma of neck (HCC) 09/06/2021   left   Nausea    Skin cancer    Wears dentures    full upper and lower    SURGICAL HISTORY: Past Surgical History:  Procedure Laterality Date   ABDOMINAL HYSTERECTOMY     APPENDECTOMY     BREAST BIOPSY Right 2001   surgical bx    CATARACT EXTRACTION W/PHACO Right 03/08/2017   Procedure: CATARACT EXTRACTION PHACO  AND INTRAOCULAR LENS PLACEMENT (IOC) RIGHT DIABETIC TORIC;  Surgeon: Lockie Mola, MD;  Location: [facility];  Service: Ophthalmology;  Laterality: Right;  Diabetic -  oral meds   CATARACT EXTRACTION W/PHACO Left 04/17/2017   Procedure: CATARACT EXTRACTION PHACO AND INTRAOCULAR LENS PLACEMENT (IOC) LEFT DIABETIC;  Surgeon: Lockie Mola, MD;  Location: [facility];  Service: Ophthalmology;  Laterality: Left;  diabetic-oral meds   CHOLECYSTECTOMY     COLONOSCOPY     COLONOSCOPY N/A 10/05/2022   Procedure: COLONOSCOPY;  Surgeon: Toledo, Boykin Nearing, MD;  Location: [facility] ENDOSCOPY;  Service: Gastroenterology;  Laterality: N/A;   COLONOSCOPY WITH PROPOFOL N/A 08/27/2015   Procedure: COLONOSCOPY WITH PROPOFOL;  Surgeon: Scot Jun, MD;  Location: [facility];  Service: Endoscopy;  Laterality: N/A;   COLONOSCOPY WITH PROPOFOL N/A 05/02/2016   Procedure: COLONOSCOPY WITH PROPOFOL;  Surgeon: Scot Jun, MD;  Location: [facility] ENDOSCOPY;  Service: Endoscopy;  Laterality: N/A;   ESOPHAGOGASTRODUODENOSCOPY     ESOPHAGOGASTRODUODENOSCOPY N/A 10/05/2022   Procedure: ESOPHAGOGASTRODUODENOSCOPY (EGD);  Surgeon: Toledo, Boykin Nearing, MD;  Location: [facility] ENDOSCOPY;  Service: Gastroenterology;  Laterality: N/A;   ESOPHAGOGASTRODUODENOSCOPY (EGD) WITH PROPOFOL N/A 08/27/2015   Procedure: ESOPHAGOGASTRODUODENOSCOPY (EGD) WITH PROPOFOL;  Surgeon: Scot Jun, MD;  Location: [facility] ENDOSCOPY;  Service: Endoscopy;  Laterality: N/A;   EYE SURGERY     HERNIA REPAIR     IR BONE MARROW BIOPSY & ASPIRATION  10/28/2022   MICROLARYNGOSCOPY Right 11/11/2022   Procedure: MICROLARYNGOSCOPY WITH BIOPSY OF PYRIFORM SINUS MASS;  Surgeon: Linus Salmons, MD;  Location: [facility];  Service: ENT;  Laterality: Right;  Diabetic    SOCIAL HISTORY: Social History   Socioeconomic History   Marital status: Widowed    Spouse name: Not on file   Number of children: Not on file   Years of education: Not on file   Highest education level: Not on file  Occupational History   Not on file  Tobacco Use   Smoking status: Former    Current packs/day: 0.00    Types:  Cigarettes    Quit date: 95    Years since quitting: 65.2   Smokeless tobacco: Never  Vaping Use   Vaping status: Never Used  Substance and Sexual Activity   Alcohol use: No   Drug use: No   Sexual activity: Not Currently  Other Topics Concern   Not on file  Social History Narrative   Not on file   Social Drivers of Health   Financial Resource Strain: Low Risk  (08/04/2023)   Received from [facility]   Overall Financial Resource Strain (CARDIA)    Difficulty of Paying Living Expenses: Not hard at all  Recent Concern: Financial Resource Strain - High Risk (05/10/2023)   Received from [facility]   Overall Financial Resource Strain (CARDIA)    Difficulty of Paying Living Expenses: Hard  Food Insecurity: No Food Insecurity (08/04/2023)   Received from [facility]   Hunger Vital Sign    Worried About Running Out of Food in the Last Year: Never true    Ran Out of Food in the Last Year: Never true  Transportation Needs: No Transportation Needs (08/04/2023)   Received from [name] - Transportation    In the past 12 months, has lack of transportation kept you from medical appointments or from getting medications?: No    Lack of Transportation (Non-Medical): No  Physical Activity: Not on  file  Stress: Not on file  Social Connections: Not on file  Intimate Partner Violence: Not At Risk (05/24/2022)   Humiliation, Afraid, Rape, and Kick questionnaire    Fear of Current or Ex-Partner: No    Emotionally Abused: No    Physically Abused: No    Sexually Abused: No    FAMILY HISTORY: Family History  Problem Relation Age of Onset   Hypertension Mother    Ovarian cancer Paternal Grandmother    COPD Father    Hyperlipidemia Father    Kidney disease Father    Diabetes Maternal Grandmother    Breast cancer Neg Hx     ALLERGIES:  is allergic to biaxin [clarithromycin], brompheniramine maleate,  ceclor [cefaclor], ciprofloxacin, clinoril [sulindac], codeine sulfate, diovan [valsartan], doxycycline, erythromycin, esomeprazole, fluoxetine, guaifenesin & derivatives, levaquin [levofloxacin in d5w], lisinopril, lodine [etodolac], macrodantin [nitrofurantoin macrocrystal], medrol [methylprednisolone], mobic [meloxicam], motrin [ibuprofen], nexium [esomeprazole magnesium], nitrofurantoin, norvasc [amlodipine besylate], omnicef [cefdinir], penicillins, prednisone, prozac [fluoxetine hcl], pseudoephedrine, reglan [metoclopramide], seldane [terfenadine], sulfa antibiotics, toprol xl [metoprolol tartrate], triamterene, tussionex pennkinetic er [hydrocod poli-chlorphe poli er], vibramycin [doxycycline calcium], and amlodipine.  MEDICATIONS:  Current Outpatient Medications  Medication Sig Dispense Refill   Ascorbic Acid (VITAMIN C) 1000 MG tablet Take 1,000 mg by mouth 2 (two) times daily.     cholecalciferol (VITAMIN D3) 25 MCG (1000 UT) tablet Take 1,000 Units by mouth 2 (two) times daily.     cyanocobalamin (VITAMIN B12) 1000 MCG tablet Take 1,000 mcg by mouth daily.     fluticasone (FLONASE) 50 MCG/ACT nasal spray Place 2 sprays into both nostrils daily as needed for allergies.     gemfibrozil (LOPID) 600 MG tablet Take 600 mg by mouth 2 (two) times daily before a meal.     glipiZIDE (GLUCOTROL XL) 5 MG 24 hr tablet Take 10 mg by mouth in the morning and at bedtime.     hydrochlorothiazide (HYDRODIURIL) 25 MG tablet Take 25 mg by mouth daily.     iron polysaccharides (NIFEREX) 150 MG capsule Take 150 mg by mouth daily.     Multiple Vitamins-Minerals (HAIR SKIN & NAILS PO) Take by mouth daily.     Multiple Vitamins-Minerals (MULTIVITAMIN WITH MINERALS) tablet Take 1 tablet by mouth daily.     Omega 3 1000 MG CAPS Take 1,000 mg by mouth daily.     pantoprazole (PROTONIX) 40 MG tablet Take 40 mg by mouth daily.     Probiotic Product (PROBIOTIC PO) Take by mouth daily.     vitamin E 180 MG (400 UNITS)  capsule Take 400 Units by mouth daily.     No current facility-administered medications for this visit.      Marland Kitchen  PHYSICAL EXAMINATION: ECOG PERFORMANCE STATUS: 0 - Asymptomatic  Vitals:   09/06/23 1411 09/06/23 1425  BP: (!) 201/89 (!) 196/86  Pulse: 78   Resp: 18   Temp: 97.6 F (36.4 C)   SpO2: 100%      Filed Weights   09/06/23 1411  Weight: 146 lb 9.6 oz (66.5 kg)      Physical Exam Constitutional:      Comments: Patient is alone.  Ambulating dependently.  HENT:     Head: Normocephalic and atraumatic.     Mouth/Throat:     Pharynx: No oropharyngeal exudate.  Eyes:     Pupils: Pupils are equal, round, and reactive to light.  Cardiovascular:     Rate and Rhythm: Normal rate and regular rhythm.  Pulmonary:  Effort: Pulmonary effort is normal. No respiratory distress.     Breath sounds: Normal breath sounds. No wheezing.  Abdominal:     General: Bowel sounds are normal. There is no distension.     Palpations: Abdomen is soft. There is no mass.     Tenderness: There is no abdominal tenderness. There is no guarding or rebound.  Musculoskeletal:        General: No tenderness. Normal range of motion.     Cervical back: Normal range of motion and neck supple.  Skin:    General: Skin is warm.  Neurological:     Mental Status: She is alert and oriented to person, place, and time.  Psychiatric:        Mood and Affect: Affect normal.    LABORATORY DATA:  I have reviewed the data as listed Lab Results  Component Value Date   WBC 4.6 09/06/2023   HGB 10.0 (L) 09/06/2023   HCT 31.0 (L) 09/06/2023   MCV 93.1 09/06/2023   PLT 124 (L) 09/06/2023   Recent Labs    12/30/22 1409 05/08/23 1408 09/06/23 1404  NA 140 140 138  K 4.3 4.3 4.3  CL 105 103 106  CO2 23 25 23   GLUCOSE 327* 330* 182*  BUN 37* 31* 33*  CREATININE 1.08* 1.00 1.04*  CALCIUM 9.5 9.2 8.8*  GFRNONAA 50* 55* 52*    RADIOGRAPHIC STUDIES: I have personally reviewed the radiological  images as listed and agreed with the findings in the report. No results found.  ASSESSMENT & PLAN:   Symptomatic anemia  #Chronic mild anemia-hemoglobin  10-11; DEC  2024- Iron sat-16% ; ferritin- 68  Not totally consistent with iron deficiency- ? CKD stage III-  # MAY 2024-bone marrow biopsy-not suggestive of myelodysplastic syndrome.  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 MAY 2024-S/p EGD/coloscopy [Dr.Toledo];   # Today hemoglobin is 9.9  -recommend proceeding with iron infusion today  Discuss the possible use of Retacrit at subsequent visits if the hemoglobin is less than 10. DEC  2024- Iron sat-16% ; ferritin- 68- Pending iron studies/ferritin   # ITP/ Thrombocytopenia-platelet count- > 100 -stable. on surveillance.  # Elevated HTN- keep a log BPs; Repeat 180-190/ . Awaiting to start Losartan- And defer to PCP, Dr.Hande. stable.   # CKD stage III- monitor closely with BP. Hydration.   # DISPOSITION: # venofer today; no retacrit # follow up in 4 months  MD: labs- cbc/bmp; LDH;  iron studies/ferritin---possible Venofer OR Retacrit- -Dr.B     Earna Coder, MD 09/06/2023 3:05 PM

## 2023-09-06 NOTE — Assessment & Plan Note (Addendum)
#  Chronic mild anemia-hemoglobin  10-11; DEC  2024- Iron sat-16% ; ferritin- 68  Not totally consistent with iron deficiency- ? CKD stage III-  # MAY 2024-bone marrow biopsy-not suggestive of myelodysplastic syndrome.  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 MAY 2024-S/p EGD/coloscopy [Dr.Toledo];   # Today hemoglobin is 9.9  -recommend proceeding with iron infusion today  Discuss the possible use of Retacrit at subsequent visits if the hemoglobin is less than 10. DEC  2024- Iron sat-16% ; ferritin- 68- Pending iron studies/ferritin   # ITP/ Thrombocytopenia-platelet count- > 100 -stable. on surveillance.  # Elevated HTN- keep a log BPs; Repeat 180-190/ . Awaiting to start Losartan- And defer to PCP, Dr.Hande. stable.   # CKD stage III- monitor closely with BP. Hydration.   # DISPOSITION: # venofer today; no retacrit # follow up in 4 months  MD: labs- cbc/bmp; LDH;  iron studies/ferritin---possible Venofer OR Retacrit- -Dr.B

## 2023-09-06 NOTE — Progress Notes (Signed)
 Fatigue/weakness: YES Dyspena: NO Light headedness: SOMETIMES Blood in stool: NO  Since last visit she has had covid, skin cancer removed from right arm, treated by Dr. Roseanne Kaufman, back pain treated by Dr. Carrolyn Leigh, had an epidural steroid injection.  C/o nausea, when she eats her stomach bloats x2 months.  Appetite 50% normal, 1 premier drink a day.  MRI kidneys, CT Bladder [place]. DX with hernia.  Has new rx for losartan but hasn't started yet.

## 2023-10-02 ENCOUNTER — Other Ambulatory Visit: Payer: Self-pay | Admitting: Internal Medicine

## 2023-10-02 DIAGNOSIS — Z1231 Encounter for screening mammogram for malignant neoplasm of breast: Secondary | ICD-10-CM

## 2023-10-12 ENCOUNTER — Ambulatory Visit
Admission: RE | Admit: 2023-10-12 | Discharge: 2023-10-12 | Disposition: A | Discharge status: Home / Self Care | Source: Ambulatory Visit | Attending: Internal Medicine | Admitting: Internal Medicine

## 2023-10-12 DIAGNOSIS — Z1231 Encounter for screening mammogram for malignant neoplasm of breast: Secondary | ICD-10-CM | POA: Insufficient documentation

## 2023-11-24 NOTE — Progress Notes (Signed)
 [facility] - Physiatry  PROCEDURE: 1. A Left sacroiliac joint injection was performed under fluoroscopic guidance. 2. Fluoroscopy was required to ensure proper needle placement. 3. The patient was monitored with continuous pulse oximetry throughout the procedure.   DIAGNOSIS/INDICATION FOR PROCEDURE: The patient is a pleasant 89 year old female whom I have followed for left-sided low back/buttock pain.  She has a positive anterior translation of the sacroiliac joint, compression of the sacroiliac joint, and positive Fortin finger sign.  Clinically she has symptoms consistent with sacroiliitis.  MRI lumbar spine without contrast from [facility] dated 12/31/2022 imaging and report reviewed.  COMPARISON:  MRI of the lumbar spine January 03, 2021  FINDINGS:  Segmentation: Standard.  Alignment:  Trace anterolisthesis of L5 over S1.  Vertebrae: Mild chronic compression fracture of the L3 superior  endplate, unchanged. A hemangioma is seen in the anterior aspect of  the L3 vertebral body. There is also a T1 and T2 hypointense lesion  in the right lateral aspect of the L3 vertebral body posteriorly  with small extension into the corresponding pedicle, nonspecific but  unchanged since prior MRI. No acute fracture or evidence of  discitis.  Conus medullaris and cauda equina: Conus extends to the L1-2 level.  Conus and cauda equina appear normal.  Paraspinal and other soft tissues: Negative.   Disc levels:  T12-L1 minimal disc bulge and mild facet degenerative changes. No  spinal canal or neural foraminal stenosis.   L1-2: Diminutive left central disc protrusion and mild facet  degenerative changes. No spinal canal or neural foraminal stenosis.   L2-3: Disc bulge, mild-to-moderate facet degenerative changes and  prominent ligamentum flavum redundancy resulting in mild-to-moderate  spinal canal stenosis with mild right and moderate left subarticular  zone stenosis  and mild bilateral neural foraminal narrowing, left  greater than right. No significant change compared to prior MRI   L3-4: Disc bulge, mild-to-moderate facet degenerative changes and  prominent ligamentum flavum redundancy resulting in moderate spinal  canal stenosis with moderate narrowing of the bilateral subarticular  zones, mild-to-moderate right and mild left neural foraminal,  unchanged.   L4-5: Loss of disc height, disc bulge, mild-to-moderate facet  degenerative changes and prominent ligamentum flavum redundancy  resulting in mild-to-moderate spinal canal stenosis with moderate  bilateral subarticular zone stenosis, mild-to-moderate right and  mild left neural foraminal narrowing. No significant change compared  to prior MRI.   L5-S1: Loss of disc height, disc bulge, mild-to-moderate facet  degenerative changes and prominent ligamentum flavum redundancy  resulting in mild spinal canal Sten with moderate subarticular zone  stenosis bilateral, right greater than left, and moderate bilateral  neural foraminal narrowing with disc contacting the L5 nerve roots  in the corresponding neural foramen. No significant change.   IMPRESSION:  1. Stable degenerative changes of the lumbar spine with moderate  spinal canal stenosis at L3-4 and mild-to-moderate at L2-3 and L4-5.  2. Multilevel subarticular zone stenosis, as described above.  3. Moderate bilateral neural foraminal narrowing at L5-S1.  4. T1 and T2 hypointense lesion in the right lateral aspect of the  L3 vertebral body posteriorly with small extension into the  corresponding pedicle, nonspecific but unchanged since prior MRI,  likely benign given stability. In case of known malignancy,  correlation with bone scan is suggested.   Electronically Signed    By: Katyucia  de Macedo Rodrigues M.D.    On: 01/11/2023 08:45   Her pain is rated 10/10.  She has thrombocytopenia.  Platelet count  on 07/06/2023 was 164.  She has  diabetes mellitus.  Hemoglobin A1C on 6/12//2025 was 6.6.  Procedures: 11/24/2023: Left sacroiliac joint injection 10/05/2018: Left lateral gluteal trigger point injection (moderate to good relief) 09/13/2023: Left hip joint injection (minimal relief) 08/29/2023: Left L3-4 and left L4-5 transforaminal ESI (mild relief x 1 to 2 days) 06/27/2023: Left and right ultrasound-guided hip injection with anesthetic only (Dr.Kubinski)  02/27/2023: Left L3-4 and left L4-5 transforaminal ESI (50% relief) 10/21/2022: Left hip joint injection 05/19/2022: Right gluteal trigger point injection (questionable postinjection flare, then good relief?) 03/04/2022: Left L5-S1 and left L4-5 transforaminal ESI 01/07/2022: Bilateral S1 transforaminal ESI (complete relief of her right leg pain; pain now on her left low back and anterior and lateral thigh and shin) 12/24/2021: Left U/S guided SI joint injection by Dr. Sharrie (no relief)   IMPRESSION/RESULTS: Patient tolerated the procedure well without any complications.  We used a 2.5 inch needle.  Immediately after the procedure she had improvement of her left-sided low back/buttock pain but had persistent pain radiating into the left leg past her knee consistent with a lumbar radiculitis.  She will follow-up with me in 3 to 4 weeks.       INFORMED CONSENT: The patient understood the potential risks and benefits of the procedure, which were explained to the patient prior to the procedure.  The patient read and signed the consent stating complete understanding of the information, and wished to proceed with the procedure.  There were no barriers to understanding.  Ample time was given for any questions to be answered prior to the procedure.  The risks of the procedure were explained including, but not limited to the risk of bleeding and/or infection into the spinal area, intervertebral discs, or epidural space; nerve injury; nerve irritation; reaction to medications;  etc.  The patient denied any history of bleeding disorders, allergies to medications used or other medical contraindications to the procedure.  No promises were given to any expected outcome.     PROCEDURE: A time out was performed by physician and assistant.  Correct patient, procedure, plan of care, site, position, and equipment were verified.  The patient was sterilely prepped with a triple scrub technique using betadine solution and draped in the prone position.  Careful attention was paid to aseptic technique throughout the procedure.  The skin and subcutaneous tissues overlying the sacroiliac joint were anesthetized with 2 cc of 1% Xylocaine .  A 25 gauge spinal needle was inserted down to the inferior aspect of the posterior sacroiliac joint. Approximately 2 cc of Omnipaque -240 mgI/mL contrast was infiltrated under real time fluoroscopy demonstrating intra-articular spread  without vascular uptake.  Spot films were taken.  A total solution containing 1.5 cc of Celestone  6mg /cc  mixed with 0.5 cc of 2% lidocaine  was slowly infiltrated.  The needle was then removed.  There was no evidence of vascular uptake and no aspirate was noted during the procedure despite frequent, intermittent attempts at aspiration.  There were no complications.    DISCHARGE SUMMARY: The patient was monitored post-injection for 30 minutes in the recovery area and remained stable without evidence of complications.  After the procedure, the patient ambulated in and from the office without difficulty.  The patient was discharged with discharge instructions in stable condition.  Patient was instructed to contact us  with any problems.  Procedure fluoro time: 7 seconds. Fluoroscopy dose: 2.10 mGy.  This is below the dose threshold #1 (5000 mGy).    BENJAMIN CHARLES CHASNIS, DO

## 2023-11-27 ENCOUNTER — Telehealth: Payer: Self-pay | Admitting: Internal Medicine

## 2023-11-27 NOTE — Telephone Encounter (Signed)
 Patient called to see if Dr. Auston called to set her up for Iron ? She would like to come on Thursday is she can.

## 2023-11-28 NOTE — Telephone Encounter (Signed)
 Order placed for updated MRI of the lumbar spine without contrast.  Order also placed for neurosurgical consult regarding left leg pain.

## 2023-11-29 NOTE — Telephone Encounter (Signed)
 Spoke to Dana with Dr.Jeremy Hill. After review Dr.Hill has declined this referral as he does not see patients for this diagnosis. Pt notified and would like to see Dr.Yarbrough. I spoke with Kaitlyn to notify her patient is now interested in getting scheduled. MRI approved and faxed to [facility]

## 2023-11-30 ENCOUNTER — Other Ambulatory Visit: Payer: Self-pay | Admitting: Physical Medicine and Rehabilitation

## 2023-11-30 ENCOUNTER — Inpatient Hospital Stay: Attending: Internal Medicine

## 2023-11-30 DIAGNOSIS — M5416 Radiculopathy, lumbar region: Secondary | ICD-10-CM

## 2023-12-01 ENCOUNTER — Ambulatory Visit
Admission: RE | Admit: 2023-12-01 | Discharge: 2023-12-01 | Disposition: A | Discharge status: Home / Self Care | Source: Ambulatory Visit | Attending: Physical Medicine and Rehabilitation | Admitting: Physical Medicine and Rehabilitation

## 2023-12-01 DIAGNOSIS — M5416 Radiculopathy, lumbar region: Secondary | ICD-10-CM

## 2023-12-04 ENCOUNTER — Other Ambulatory Visit: Payer: Self-pay | Admitting: Physical Medicine and Rehabilitation

## 2023-12-04 DIAGNOSIS — S32000A Wedge compression fracture of unspecified lumbar vertebra, initial encounter for closed fracture: Secondary | ICD-10-CM

## 2023-12-05 ENCOUNTER — Inpatient Hospital Stay
Admission: RE | Admit: 2023-12-05 | Discharge: 2023-12-05 | Discharge status: Home / Self Care | Source: Ambulatory Visit | Attending: Physical Medicine and Rehabilitation | Admitting: Physical Medicine and Rehabilitation

## 2023-12-05 ENCOUNTER — Encounter: Payer: Self-pay | Admitting: Internal Medicine

## 2023-12-05 DIAGNOSIS — S32000A Wedge compression fracture of unspecified lumbar vertebra, initial encounter for closed fracture: Secondary | ICD-10-CM

## 2023-12-05 HISTORY — PX: IR RADIOLOGIST EVAL & MGMT: IMG5224

## 2023-12-05 NOTE — Consult Note (Signed)
 Chief Complaint: Patient was seen in consultation today for No chief complaint on file.  at the request of Chasnis,Benjamin  Referring Physician(s): Chasnis,Benjamin  History of Present Illness: Kim Ballard is a 89 y.o. female  History significant for melanoma, IBS, HTN, DM, fibromyalgia and iron  deficiency anemia requiring transfusions. Ms Fiorentino states that she was at a chiropractor in the beginning of January 2024 when she heard a pop. Since that time she has had persistent and worsening back pain. She describes the pain as severe  making her sick on her stomach. She states that the pain starts in her lower back and radiates across her  left hip pain and down the lateral aspect of her left leg.    Since the time of her injury she has been followed by PCP for pain control.  She has tried conservative measures including ice/ heat, naproxen, and tylenol  every 6 hours. She also undergone steroid injections using fluoroscopic guidance. The most recent performed on 6.20.25. Despite these measure her pain remains unresolved. Latest MR Lumbar Spine dated 6.27.25  reads L5 superior endplate vertebral compression fracture (with 30-40% vertebral body height loss)  She endorses  intermittent numbness and tingling  to the lateral aspect of her left leg with left leg swelling. She states that she has had some neuropathy at baseline but this has worsened since her back pain began.  She endorses  tenderness to her left sacrum and lower lumbar spine.   The patient has kindly been referred to Interventional Radiology to discuss vertebral body augmentation of the L5 compression fracture.   For the last several months he has had incredibly limited mobility that is affecting her daily living. She has found that she needs the use of a cane to perform ADL's which he did not need prior to January 2024.  She states that the pain is so severe she is unable to sleep for extended periods of time. She rates his  pain an 10/10 and he scored a 17/24 on the  [employer] Disability Questionnaire.   Past Medical History:  Diagnosis Date   Allergic state    Anemia    Arthritis    Cancer (HCC)    skin cancers   Chronic cystitis with hematuria    Complication of anesthesia 10/28/2022   Excessive night sweats and itching after procedure 10/28/22   Depression    Diabetes mellitus without complication (HCC)    Fibrocystic disease of both breasts    Fibromyalgia    GERD (gastroesophageal reflux disease)    Hypertension    IBS (irritable bowel syndrome)    Melanoma (HCC) 07/09/2021   lt neck/shoulder area   Melanoma of neck (HCC) 09/06/2021   left   Nausea    Skin cancer    Wears dentures    full upper and lower    Past Surgical History:  Procedure Laterality Date   ABDOMINAL HYSTERECTOMY     APPENDECTOMY     BREAST BIOPSY Right 2001   surgical bx    CATARACT EXTRACTION W/PHACO Right 03/08/2017   Procedure: CATARACT EXTRACTION PHACO AND INTRAOCULAR LENS PLACEMENT (IOC) RIGHT DIABETIC TORIC;  Surgeon: Mittie Gaskin, MD;  Location: [facility];  Service: Ophthalmology;  Laterality: Right;  Diabetic - oral meds   CATARACT EXTRACTION W/PHACO Left 04/17/2017   Procedure: CATARACT EXTRACTION PHACO AND INTRAOCULAR LENS PLACEMENT (IOC) LEFT DIABETIC;  Surgeon: Mittie Gaskin, MD;  Location: [facility];  Service: Ophthalmology;  Laterality: Left;  diabetic-oral meds  CHOLECYSTECTOMY     COLONOSCOPY     COLONOSCOPY N/A 10/05/2022   Procedure: COLONOSCOPY;  Surgeon: Toledo, Ladell POUR, MD;  Location: [facility] ENDOSCOPY;  Service: Gastroenterology;  Laterality: N/A;   COLONOSCOPY WITH PROPOFOL  N/A 08/27/2015   Procedure: COLONOSCOPY WITH PROPOFOL ;  Surgeon: Lamar ONEIDA Holmes, MD;  Location: [facility] ENDOSCOPY;  Service: Endoscopy;  Laterality: N/A;   COLONOSCOPY WITH PROPOFOL  N/A 05/02/2016   Procedure: COLONOSCOPY WITH PROPOFOL ;  Surgeon: Lamar ONEIDA Holmes, MD;  Location: [facility]  ENDOSCOPY;  Service: Endoscopy;  Laterality: N/A;   ESOPHAGOGASTRODUODENOSCOPY     ESOPHAGOGASTRODUODENOSCOPY N/A 10/05/2022   Procedure: ESOPHAGOGASTRODUODENOSCOPY (EGD);  Surgeon: Toledo, Ladell POUR, MD;  Location: [facility] ENDOSCOPY;  Service: Gastroenterology;  Laterality: N/A;   ESOPHAGOGASTRODUODENOSCOPY (EGD) WITH PROPOFOL  N/A 08/27/2015   Procedure: ESOPHAGOGASTRODUODENOSCOPY (EGD) WITH PROPOFOL ;  Surgeon: Lamar ONEIDA Holmes, MD;  Location: [facility] ENDOSCOPY;  Service: Endoscopy;  Laterality: N/A;   EYE SURGERY     HERNIA REPAIR     IR BONE MARROW BIOPSY & ASPIRATION  10/28/2022   MICROLARYNGOSCOPY Right 11/11/2022   Procedure: MICROLARYNGOSCOPY WITH BIOPSY OF PYRIFORM SINUS MASS;  Surgeon: Herminio Miu, MD;  Location: [facility];  Service: ENT;  Laterality: Right;  Diabetic    Allergies: Biaxin [clarithromycin], Brompheniramine maleate, Ceclor [cefaclor], Ciprofloxacin, Clinoril [sulindac], Codeine sulfate, Diovan [valsartan], Doxycycline, Erythromycin, Esomeprazole, Fluoxetine, Guaifenesin & derivatives, Levaquin [levofloxacin in d5w], Lisinopril, Lodine [etodolac], Macrodantin [nitrofurantoin macrocrystal], Medrol  [methylprednisolone ], Mobic [meloxicam], Motrin [ibuprofen], Nexium [esomeprazole magnesium ], Nitrofurantoin, Norvasc [amlodipine besylate], Omnicef [cefdinir], Penicillins, Prednisone , Prozac [fluoxetine hcl], Pseudoephedrine, Reglan [metoclopramide], Seldane [terfenadine], Sulfa antibiotics, Toprol xl [metoprolol tartrate], Triamterene, Tussionex pennkinetic er [hydrocod poli-chlorphe poli er], Vibramycin [doxycycline calcium], and Amlodipine  Medications: Prior to Admission medications   Medication Sig Start Date End Date Taking? Authorizing Provider  Ascorbic Acid (VITAMIN C) 1000 MG tablet Take 1,000 mg by mouth 2 (two) times daily.    [provider]  cholecalciferol  (VITAMIN D3) 25 MCG (1000 UT) tablet Take 1,000 Units by mouth 2 (two) times daily.    [provider]  cyanocobalamin  (VITAMIN B12) 1000 MCG tablet Take 1,000 mcg by mouth daily.    [provider]  fluticasone (FLONASE) 50 MCG/ACT nasal spray Place 2 sprays into both nostrils daily as needed for allergies.    [provider]  gemfibrozil  (LOPID ) 600 MG tablet Take 600 mg by mouth 2 (two) times daily before a meal.    [provider]  glipiZIDE (GLUCOTROL XL) 5 MG 24 hr tablet Take 10 mg by mouth in the morning and at bedtime.    [provider]  hydrochlorothiazide  (HYDRODIURIL ) 25 MG tablet Take 25 mg by mouth daily.    [provider]  iron  polysaccharides (NIFEREX) 150 MG capsule Take 150 mg by mouth daily.    [provider]  Multiple Vitamins-Minerals (HAIR SKIN & NAILS PO) Take by mouth daily.    [provider]  Multiple Vitamins-Minerals (MULTIVITAMIN WITH MINERALS) tablet Take 1 tablet by mouth daily.    [provider]  Omega 3 1000 MG CAPS Take 1,000 mg by mouth daily.    [provider]  pantoprazole  (PROTONIX ) 40 MG tablet Take 40 mg by mouth daily.    [provider]  Probiotic Product (PROBIOTIC PO) Take by mouth daily.    [provider]  vitamin E 180 MG (400 UNITS) capsule Take 400 Units by mouth daily.    [provider]     Family History  Problem Relation Age  of Onset   Hypertension Mother    Ovarian cancer Paternal Grandmother    COPD Father    Hyperlipidemia Father    Kidney disease Father    Diabetes Maternal Grandmother    Breast cancer Neg Hx     Social History   Socioeconomic History   Marital status: Widowed    Spouse name: Not on file   Number of children: Not on file   Years of education: Not on file   Highest education level: Not on file  Occupational History   Not on file  Tobacco Use   Smoking status: Former    Current packs/day: 0.00    Types: Cigarettes    Quit date: 31    Years since quitting: 65.5   Smokeless  tobacco: Never  Vaping Use   Vaping status: Never Used  Substance and Sexual Activity   Alcohol use: No   Drug use: No   Sexual activity: Not Currently  Other Topics Concern   Not on file  Social History Narrative   Not on file   Social Drivers of Health   Financial Resource Strain: Low Risk  (11/23/2023)   Received from [facility]   Overall Financial Resource Strain (CARDIA)    Difficulty of Paying Living Expenses: Not hard at all  Food Insecurity: No Food Insecurity (11/23/2023)   Received from [facility]   Hunger Vital Sign    Within the past 12 months, you worried that your food would run out before you got the money to buy more.: Never true    Within the past 12 months, the food you bought just didn't last and you didn't have money to get more.: Never true  Transportation Needs: No Transportation Needs (11/23/2023)   Received from [facility] - Transportation    In the past 12 months, has lack of transportation kept you from medical appointments or from getting medications?: No    Lack of Transportation (Non-Medical): No  Physical Activity: Not on file  Stress: Not on file  Social Connections: Not on file     Review of Systems: A 12 point ROS discussed and pertinent positives are indicated in the HPI above.  All other systems are negative.  Review of Systems  Constitutional:  Negative for fatigue and fever.  HENT:  Negative for congestion.   Respiratory:  Negative for cough and shortness of breath.   Gastrointestinal:  Negative for abdominal pain, diarrhea, nausea and vomiting.  Musculoskeletal:  Positive for back pain (radiating to left hip and down left leg).       Left leg swelling   Neurological:  Positive for numbness (down left leg).    Vital Signs: BP (!) 214/84 (BP Location: Left Arm, Patient Position: Sitting, Cuff Size: Normal)   Pulse 82   Temp 97.6 F (36.4 C)   Resp 14   SpO2 100%    Advance Care Plan: The advanced care plan/surrogate decision maker was discussed at the time of visit and the patient did not wish to discuss or was not able to name a surrogate decision maker or provide an advance care plan.    Physical Exam Vitals and nursing note reviewed.  Constitutional:      Appearance: She is well-developed.  HENT:     Head: Normocephalic and atraumatic.   Eyes:     Conjunctiva/sclera: Conjunctivae normal.   Pulmonary:     Effort: Pulmonary effort is normal.  Musculoskeletal:        General: Tenderness (with palpation to the lower back in the lumbar spine region and left hip) present.     Cervical back: Normal range of motion.   Skin:    General: Skin is warm and dry.   Neurological:     General: No focal deficit present.     Mental Status: She is alert and oriented to person, place, and time. Mental status is at baseline.   Psychiatric:        Mood and Affect: Mood normal.        Behavior: Behavior normal.        Thought Content: Thought content normal.        Judgment: Judgment normal.     Mallampati Score:     Imaging: MR LUMBAR SPINE WO CONTRAST Result Date: 12/01/2023 CLINICAL DATA:  Provided history: Lumbar radiculopathy. Additional history provided by the scanning technologist: History of melanoma. EXAM: MRI LUMBAR SPINE WITHOUT CONTRAST TECHNIQUE: Multiplanar, multisequence MR imaging of the lumbar spine was performed. No intravenous contrast was administered. COMPARISON:  Lumbar spine MRI 12/31/2022. CT abdomen/pelvis 06/29/2023. FINDINGS: Segmentation: 5 lumbar vertebrae. The caudal most well-formed intervertebral disc space is designated L5-S1. Alignment: Slight T12-L1 grade 1 retrolisthesis. Slight L1-L2 grade 1 anterolisthesis. Slight trace bony retropulsion at the level of the L3 superior endplate. Slight L4-L5 grade 1 anterolisthesis. 3-4 mm L5-S1 grade 1 anterolisthesis. Vertebrae: Redemonstrated chronic L3 superior endplate  vertebral compression fracture. 30-40% height loss at this level, unchanged from the prior CT abdomen/pelvis of 06/29/2023. Redemonstrated chronic L4 superior endplate vertebral compression fracture. 40% vertebral body height loss at this level, unchanged. L5 superior endplate vertebral compression fracture on the left (with 30-40% vertebral body height loss), new from the prior CT. Marrow edema within the L5 vertebra (abutting the superior endplate). No significant bony retropulsion at this level. Mild degenerative appearing endplate edema at O7-O6 and L3-L4. Conus medullaris and cauda equina: Conus extends to the L1-L2 level. No signal abnormality identified within the visualized distal spinal cord. Paraspinal and other soft tissues: No acute finding within included portions of the abdomen/retroperitoneum. No paraspinal mass or collection. Disc levels: Unless otherwise stated, the level by level findings below have not significantly changed from the prior MRI of 12/31/2022. Multilevel disc degeneration, greatest at L2-L3, L3-L4, L4-L5 and L5-S1 (moderate at these levels). T12-L1: This level is imaged in the sagittal plane only. Slight grade 1 retrolisthesis. Small disc bulge. Mild facet arthropathy. No significant spinal canal or foraminal stenosis. L1-L2: Slight grade 1 retrolisthesis. Small disc bulge. Mild facet hypertrophy. No significant spinal canal or foraminal stenosis. L2-L3: Slight grade 1 anterolisthesis. Disc bulge asymmetric to the left. Moderate facet arthropathy with bulky ligamentum flavum hypertrophy. Bilateral subarticular stenosis (mild right, moderate left). Mild-to-moderate central canal stenosis. Mild bilateral neural foraminal narrowing (greater on the left). L3-L4: Disc bulge. Moderate facet arthropathy with bulky ligamentum flavum hypertrophy. Severe central canal and bilateral subarticular stenosis, progressed. Moderate bilateral neural foraminal narrowing, progressed. L4-L5: Slight grade  1 anterolisthesis. Disc bulge asymmetric to the right. Moderate facet arthropathy with bulky ligamentum flavum hypertrophy. Mild to moderate bilateral subarticular stenosis (greater on the left). Mild-to-moderate central canal stenosis. Bilateral neural foraminal narrowing (moderate-to-severe right, mild left). L5-S1: Grade 1 anterolisthesis. Disc uncovering with disc bulge. Facet arthropathy (mild right, moderate left). Bulky ligamentum flavum hypertrophy (greater on the left). Mild-to-moderate left subarticular narrowing. Mild relative narrowing of the central canal. Bilateral neural foraminal narrowing (moderate right, severe left). Impression #1  will be called to the ordering clinician or representative by the Radiologist Assistant, and communication documented in the PACS or [employer]. IMPRESSION: 1. L5 superior endplate vertebral compression fracture (with 30-40% vertebral body height loss), new from the prior CT abdomen/pelvis of 06/29/2023. There is marrow edema within the L5 vertebral body (abutting the superior endplate) and this fracture may be acute/subacute. No significant bony retropulsion at this level. 2. Unchanged chronic L3 and L4 superior endplate vertebral compression fractures. 3. Lumbar spondylosis as outlined within the body of the report. 4. At L3-L4, there is progressive multifactorial severe bilateral subarticular and central canal stenosis. Progressive moderate bilateral neural foraminal narrowing also present at this level. 5. Spondylosis has otherwise not significantly changed. 6. Additional sites of subarticular stenosis, greatest on the left at L2-L3 (moderate), on the left at L4-L5 (mild-to-moderate) and on the left at L5-S1 (mild-to-moderate). 7. Additional sites of central canal stenosis, greatest at L2-L3 and L4-L5 (mild-to-moderate at these levels). 8. Additional sites of foraminal stenosis, greatest on the right at L4-L5 (moderate-to-severe) and on the left at L5-S1  (severe). Electronically Signed   By: Rockey Childs D.O.   On: 12/01/2023 18:32    Labs:  CBC: Recent Labs    12/30/22 1409 05/08/23 1407 09/06/23 1404  WBC 4.5 4.1 4.6  HGB 10.0* 9.9* 10.0*  HCT 30.9* 29.7* 31.0*  PLT 139* 177 124*    COAGS: No results for input(s): INR, APTT in the last 8760 hours.  BMP: Recent Labs    12/30/22 1409 05/08/23 1408 09/06/23 1404  NA 140 140 138  K 4.3 4.3 4.3  CL 105 103 106  CO2 23 25 23   GLUCOSE 327* 330* 182*  BUN 37* 31* 33*  CALCIUM 9.5 9.2 8.8*  CREATININE 1.08* 1.00 1.04*  GFRNONAA 50* 55* 52*    LIVER FUNCTION TESTS: No results for input(s): BILITOT, AST, ALT, ALKPHOS, PROT, ALBUMIN in the last 8760 hours.  TUMOR MARKERS: No results for input(s): AFPTM, CEA, CA199, CHROMGRNA in the last 8760 hours.  Assessment and Plan:   Patient has suffered acute osteoporotic fracture of the L5 vertebra.   History and exam have demonstrated the following:  Acute/Subacute fracture by imaging dated 6.27.25, Pain on exam concordant with level of fracture, Failure of conservative therapy and pain refractory to narcotic pain mediation, Inability to tolerate narcotic pain medication due to side effects, and Significant disability on the [employer] Disability Questionnaire with 17/24 positive symptoms, reflecting significant impact/impairment of (ADLs)   ICD-10-CM Codes that Support Medical Necessity (WelshBlog.at.[email or web address]?articleId=[email or web address])  Group 1 codes (Osteoporotic Vertebral Fractures)  - Select 1 below  and [email or web address]    Age-related osteoporosis with current pathological fracture, vertebra(e), initial encounter for fracture   Plan:  L5 vertebral body augmentation with balloon kyphoplasty  Post-procedure disposition: outpatient [facility] Medication holds: none  The patient has suffered a fracture of the L5 vertebral body. It is recommended that patients aged 15  years or older be evaluated for possible testing or treatment of osteoporosis. A copy of this consult report is sent to the patient's referring physician.  Advanced Care Plan: The patient has an Advanced Care Plan or Surrogate Decision Maker documented in the EMR    Total time spent on today's visit was over 40 Minutes  , including both face-to-face time and non face-to-face time, personally spent on review of chart (including labs and relevant imaging), discussing further workup and treatment options, referral to specialist if needed, reviewing outside records if pertinent, answering  patient questions, and coordinating care regarding L5 Kyphoplasty as well as management strategy.    Thank you for this interesting consult.  I greatly enjoyed meeting DAYLE SHERPA and look forward to participating in their care.  A copy of this report was sent to the requesting provider on this date.  Electronically Signed: Delon JAYSON Beagle 12/05/2023, 10:09 AM

## 2023-12-06 ENCOUNTER — Other Ambulatory Visit: Payer: Self-pay | Admitting: Interventional Radiology

## 2023-12-06 DIAGNOSIS — M8008XA Age-related osteoporosis with current pathological fracture, vertebra(e), initial encounter for fracture: Secondary | ICD-10-CM

## 2023-12-06 DIAGNOSIS — S32050A Wedge compression fracture of fifth lumbar vertebra, initial encounter for closed fracture: Secondary | ICD-10-CM

## 2023-12-11 ENCOUNTER — Telehealth: Payer: Self-pay

## 2023-12-11 NOTE — Discharge Instructions (Signed)

## 2023-12-12 ENCOUNTER — Inpatient Hospital Stay
Admission: RE | Admit: 2023-12-12 | Discharge: 2023-12-12 | Disposition: A | Discharge status: Home / Self Care | Source: Ambulatory Visit | Attending: Interventional Radiology | Admitting: Interventional Radiology

## 2023-12-12 DIAGNOSIS — M8008XA Age-related osteoporosis with current pathological fracture, vertebra(e), initial encounter for fracture: Secondary | ICD-10-CM

## 2023-12-12 DIAGNOSIS — S32050A Wedge compression fracture of fifth lumbar vertebra, initial encounter for closed fracture: Secondary | ICD-10-CM

## 2023-12-12 HISTORY — PX: IR KYPHO LUMBAR INC FX REDUCE BONE BX UNI/BIL CANNULATION INC/IMAGING: IMG5519

## 2023-12-12 MED ORDER — FENTANYL CITRATE PF 50 MCG/ML IJ SOSY: 25.0000 ug | PREFILLED_SYRINGE | INTRAMUSCULAR | Status: DC | PRN

## 2023-12-12 MED ORDER — VANCOMYCIN HCL IN DEXTROSE 1-5 GM/200ML-% IV SOLN
1000.0000 mg | INTRAVENOUS | Status: AC
Start: 2023-12-12 — End: 2023-12-12
  Administered 2023-12-12: 1000 mg via INTRAVENOUS

## 2023-12-12 MED ORDER — FENTANYL CITRATE (PF) 100 MCG/2ML IJ SOLN
INTRAMUSCULAR | Status: AC | PRN
  Administered 2023-12-12: 25 ug via INTRAVENOUS
  Administered 2023-12-12: 50 ug via INTRAVENOUS

## 2023-12-12 MED ORDER — MIDAZOLAM HCL 2 MG/2ML IJ SOLN: 1.0000 mg | INTRAMUSCULAR | Status: DC | PRN

## 2023-12-12 MED ORDER — LIDOCAINE-EPINEPHRINE 1 %-1:100000 IJ SOLN
20.0000 mL | Freq: Once | INTRAMUSCULAR | Status: AC
  Administered 2023-12-12: 20 mL via INTRADERMAL

## 2023-12-12 MED ORDER — MIDAZOLAM HCL 2 MG/2ML IJ SOLN
INTRAMUSCULAR | Status: AC | PRN
  Administered 2023-12-12 (×2): 1 mg via INTRAVENOUS

## 2023-12-12 MED ORDER — ACETAMINOPHEN 10 MG/ML IV SOLN
1000.0000 mg | Freq: Once | INTRAVENOUS | Status: AC
  Administered 2023-12-12: 1000 mg via INTRAVENOUS

## 2023-12-12 MED ORDER — SODIUM CHLORIDE 0.9 % IV SOLN: INTRAVENOUS | Status: DC

## 2023-12-13 ENCOUNTER — Telehealth: Payer: Self-pay

## 2023-12-14 ENCOUNTER — Other Ambulatory Visit: Payer: Self-pay | Admitting: Interventional Radiology

## 2023-12-14 ENCOUNTER — Inpatient Hospital Stay: Attending: Internal Medicine

## 2023-12-14 ENCOUNTER — Ambulatory Visit
Admission: RE | Admit: 2023-12-14 | Discharge: 2023-12-14 | Disposition: A | Discharge status: Home / Self Care | Source: Ambulatory Visit | Attending: Interventional Radiology | Admitting: Interventional Radiology

## 2023-12-14 VITALS — BP 168/67 | HR 78 | Temp 97.0°F | Resp 16

## 2023-12-14 DIAGNOSIS — D509 Iron deficiency anemia, unspecified: Secondary | ICD-10-CM | POA: Insufficient documentation

## 2023-12-14 DIAGNOSIS — G8928 Other chronic postprocedural pain: Secondary | ICD-10-CM

## 2023-12-14 DIAGNOSIS — D508 Other iron deficiency anemias: Secondary | ICD-10-CM

## 2023-12-14 HISTORY — PX: IR RADIOLOGIST EVAL & MGMT: IMG5224

## 2023-12-14 MED ORDER — KETOROLAC TROMETHAMINE 30 MG/ML IJ SOLN
30.0000 mg | Freq: Once | INTRAMUSCULAR | Status: AC
  Administered 2023-12-14: 30 mg via INTRAMUSCULAR

## 2023-12-14 MED ORDER — IRON SUCROSE 20 MG/ML IV SOLN
200.0000 mg | Freq: Once | INTRAVENOUS | Status: AC
Start: 2023-12-14 — End: 2023-12-14
  Administered 2023-12-14: 200 mg via INTRAVENOUS
  Filled 2023-12-14: qty 10

## 2023-12-14 NOTE — Patient Instructions (Signed)

## 2023-12-14 NOTE — Progress Notes (Signed)
 Chief Complaint: Patient was seen in consultation today for right anterior rib pain at the request of Naiya Corral K  Referring Physician(s): Kaelyn Innocent K  History of Present Illness: Kim Ballard is a 89 y.o. female who underwent L5 KP with me this past Tuesday.  Since the procedure, she has had significant right anterior rib pain which has been resistant to tylenol , aleve, ice and biofreeze.  The pain is significant and worse with movement and deep inspiration.   She remembers feeling like something was poking her in that area during the procedure.    Past Medical History:  Diagnosis Date   Allergic state    Anemia    Arthritis    Cancer (HCC)    skin cancers   Chronic cystitis with hematuria    Complication of anesthesia 10/28/2022   Excessive night sweats and itching after procedure 10/28/22   Depression    Diabetes mellitus without complication (HCC)    Fibrocystic disease of both breasts    Fibromyalgia    GERD (gastroesophageal reflux disease)    Hypertension    IBS (irritable bowel syndrome)    Melanoma (HCC) 07/09/2021   lt neck/shoulder area   Melanoma of neck (HCC) 09/06/2021   left   Nausea    Skin cancer    Wears dentures    full upper and lower    Past Surgical History:  Procedure Laterality Date   ABDOMINAL HYSTERECTOMY     APPENDECTOMY     BREAST BIOPSY Right 2001   surgical bx    CATARACT EXTRACTION W/PHACO Right 03/08/2017   Procedure: CATARACT EXTRACTION PHACO AND INTRAOCULAR LENS PLACEMENT (IOC) RIGHT DIABETIC TORIC;  Surgeon: Mittie Gaskin, MD;  Location: [facility];  Service: Ophthalmology;  Laterality: Right;  Diabetic - oral meds   CATARACT EXTRACTION W/PHACO Left 04/17/2017   Procedure: CATARACT EXTRACTION PHACO AND INTRAOCULAR LENS PLACEMENT (IOC) LEFT DIABETIC;  Surgeon: Mittie Gaskin, MD;  Location: [facility];  Service: Ophthalmology;  Laterality: Left;  diabetic-oral meds    CHOLECYSTECTOMY     COLONOSCOPY     COLONOSCOPY N/A 10/05/2022   Procedure: COLONOSCOPY;  Surgeon: Toledo, Ladell POUR, MD;  Location: [facility] ENDOSCOPY;  Service: Gastroenterology;  Laterality: N/A;   COLONOSCOPY WITH PROPOFOL  N/A 08/27/2015   Procedure: COLONOSCOPY WITH PROPOFOL ;  Surgeon: Lamar ONEIDA Holmes, MD;  Location: [facility];  Service: Endoscopy;  Laterality: N/A;   COLONOSCOPY WITH PROPOFOL  N/A 05/02/2016   Procedure: COLONOSCOPY WITH PROPOFOL ;  Surgeon: Lamar ONEIDA Holmes, MD;  Location: [facility] END[facility]COPY;  Service: Endoscopy;  Laterality: N/A;   ESOPHAGOGASTRODUODENOSCOPY     ESOPHAGOGASTRODUODENOSCOPY N/A 10/05/2022   Procedure: ESOPHAGOGASTRODUODENOSCOPY (EGD);  Surgeon: Toledo, Ladell POUR, MD;  Location: [facility] ENDOSCOPY;  Service: Gastroenterology;  Laterality: N/A;   ESOPHAGOGASTRODUODENOSCOPY (EGD) WITH PROPOFOL  N/A 08/27/2015   Procedure: ESOPHAGOGASTRODUODENOSCOPY (EGD) WITH PROPOFOL ;  Surgeon: Lamar ONEIDA Holmes, MD;  Location: [facility] ENDOSCOPY;  Service: Endoscopy;  Laterality: N/A;   EYE SURGERY     HERNIA REPAIR     IR BONE MARROW BIOPSY & ASPIRATION  10/28/2022   IR KYPHO LUMBAR INC FX REDUCE BONE BX UNI/BIL CANNULATION INC/IMAGING  12/12/2023   IR RADIOLOGIST EVAL & MGMT  12/05/2023   MICROLARYNGOSCOPY Right 11/11/2022   Procedure: MICROLARYNGOSCOPY WITH BIOPSY OF PYRIFORM SINUS MASS;  Surgeon: Herminio Miu, MD;  Location: [facility];  Service: ENT;  Laterality: Right;  Diabetic    Allergies: Biaxin [clarithromycin], Brompheniramine maleate, Ceclor [cefaclor], Ciprofloxacin, Clinoril [sulindac], Codeine sulfate, Diovan [valsartan], Doxycycline,  Erythromycin, Esomeprazole, Fluoxetine, Guaifenesin & derivatives, Levaquin [levofloxacin in d5w], Lisinopril, Lodine [etodolac], Macrodantin [nitrofurantoin macrocrystal], Medrol  [methylprednisolone ], Mobic [meloxicam], Motrin [ibuprofen], Nexium [esomeprazole magnesium ], Nitrofurantoin, Norvasc [amlodipine besylate], Omnicef  [cefdinir], Penicillins, Prednisone , Prozac [fluoxetine hcl], Pseudoephedrine, Reglan [metoclopramide], Seldane [terfenadine], Sulfa antibiotics, Toprol xl [metoprolol tartrate], Triamterene, Tussionex pennkinetic er [hydrocod poli-chlorphe poli er], Vibramycin [doxycycline calcium], and Amlodipine  Medications: Prior to Admission medications   Medication Sig Start Date End Date Taking? Authorizing Provider  Ascorbic Acid (VITAMIN C) 1000 MG tablet Take 1,000 mg by mouth 2 (two) times daily.    [provider]  cholecalciferol  (VITAMIN D3) 25 MCG (1000 UT) tablet Take 1,000 Units by mouth 2 (two) times daily.    [provider]  cyanocobalamin  (VITAMIN B12) 1000 MCG tablet Take 1,000 mcg by mouth daily.    [provider]  fluticasone (FLONASE) 50 MCG/ACT nasal spray Place 2 sprays into both nostrils daily as needed for allergies.    [provider]  gemfibrozil  (LOPID ) 600 MG tablet Take 600 mg by mouth 2 (two) times daily before a meal.    [provider]  glipiZIDE (GLUCOTROL XL) 5 MG 24 hr tablet Take 10 mg by mouth in the morning and at bedtime.    [provider]  hydrochlorothiazide  (HYDRODIURIL ) 25 MG tablet Take 25 mg by mouth daily.    [provider]  iron  polysaccharides (NIFEREX) 150 MG capsule Take 150 mg by mouth daily.    [provider]  Multiple Vitamins-Minerals (HAIR SKIN & NAILS PO) Take by mouth daily.    [provider]  Multiple Vitamins-Minerals (MULTIVITAMIN WITH MINERALS) tablet Take 1 tablet by mouth daily.    [provider]  Omega 3 1000 MG CAPS Take 1,000 mg by mouth daily.    [provider]  pantoprazole  (PROTONIX ) 40 MG tablet Take 40 mg by mouth daily.    [provider]  Probiotic Product (PROBIOTIC PO) Take by mouth daily.    [provider]  vitamin E 180 MG (400 UNITS) capsule Take 400 Units by mouth daily.    [provider]      Family History  Problem Relation Age of Onset   Hypertension Mother    Ovarian cancer Paternal Grandmother    COPD Father    Hyperlipidemia Father    Kidney disease Father    Diabetes Maternal Grandmother    Breast cancer Neg Hx     Social History   Socioeconomic History   Marital status: Widowed    Spouse name: Not on file   Number of children: Not on file   Years of education: Not on file   Highest education level: Not on file  Occupational History   Not on file  Tobacco Use   Smoking status: Former    Current packs/day: 0.00    Types: Cigarettes    Quit date: 60    Years since quitting: 65.5   Smokeless tobacco: Never  Vaping Use   Vaping status: Never Used  Substance and Sexual Activity   Alcohol use: No   Drug use: No   Sexual activity: Not Currently  Other Topics Concern   Not on file  Social History Narrative   Not on file   Social Drivers of Health   Financial Resource Strain: Low Risk  (11/23/2023)   Received from [facility]   Overall Financial Resource Strain (CARDIA)    Difficulty of Paying Living Expenses: Not hard at all  Food  Insecurity: No Food Insecurity (11/23/2023)   Received from [facility]   Hunger Vital Sign    Within the past 12 months, you worried that your food would run out before you got the money to buy more.: Never true    Within the past 12 months, the food you bought just didn't last and you didn't have money to get more.: Never true  Transportation Needs: No Transportation Needs (11/23/2023)   Received from [facility] - Transportation    In the past 12 months, has lack of transportation kept you from medical appointments or from getting medications?: No    Lack of Transportation (Non-Medical): No  Physical Activity: Not on file  Stress: Not on file  Social Connections: Not on file    Review of Systems: A 12 point ROS discussed and pertinent positives are  indicated in the HPI above.  All other systems are negative.  Review of Systems  Vital Signs: BP (!) 166/76   Pulse 88   Temp 98.7 F (37.1 C)   Resp 16   SpO2 97%   Advance Care Plan: The advanced care plan/surrogate decision maker was discussed at the time of visit and the patient did not wish to discuss or was not able to name a surrogate decision maker or provide an advance care plan.    Physical Exam Constitutional:      General: She is not in acute distress.    Appearance: Normal appearance. She is normal weight.  HENT:     Head: Normocephalic and atraumatic.  Eyes:     General: No scleral icterus. Cardiovascular:     Rate and Rhythm: Normal rate.  Abdominal:     General: Abdomen is flat. There is no distension.     Tenderness: There is no abdominal tenderness.  Musculoskeletal:        General: Tenderness present.       Arms:     Comments: + TTP at the right anterolateral costal cartilage just under the breast  Skin:    General: Skin is warm and dry.     Findings: No bruising, erythema, lesion or rash.  Neurological:     Mental Status: She is alert and oriented to person, place, and time.  Psychiatric:        Behavior: Behavior normal.       Imaging: IR KYPHO LUMBAR INC FX REDUCE BONE BX UNI/BIL CANNULATION INC/IMAGING Result Date: 12/12/2023 CLINICAL DATA:  89 year old female with highly symptomatic highly symptomatic osteoporotic compression fracture of the L5 vertebral body. She presents for cement augmentation. EXAM: FLUOROSCOPIC GUIDED KYPHOPLASTY OF THE L5 VERTEBRAL BODY COMPARISON:  None Available. MEDICATIONS: As antibiotic prophylaxis, 1 g vancomycin  was ordered pre-procedure and administered intravenously by the Radiology nurse within 1 hour of incision. ANESTHESIA/SEDATION: Moderate (conscious) sedation was employed during this procedure. A total of Versed  2 mg and Fentanyl  75 mcg was administered intravenously by the Radiology nurse. Moderate Sedation  Time: 23 minutes. The patient's level of consciousness and vital signs were monitored continuously by radiology nursing throughout the procedure under my direct supervision. FLUOROSCOPY TIME:  Radiation exposure index: 56.7 mGy, reference air Karma COMPLICATIONS: None immediate. PROCEDURE: The procedure, risks (including but not limited to bleeding, infection, organ damage), benefits, and alternatives were explained to the patient. Questions regarding the procedure were encouraged and answered. The patient understands and consents to the procedure. The patient has suffered a fracture of the L5 vertebral body. It is  recommended that patients aged 85 years or older be evaluated for possible testing or treatment of osteoporosis. The patient was placed prone on the fluoroscopic table. The skin overlying the lumbar region was then prepped and draped in the usual sterile fashion. Maximal barrier sterile technique was utilized including caps, mask, sterile gowns, sterile gloves, sterile drape, hand hygiene and skin antiseptic. Intravenous Fentanyl  and Versed  were administered as conscious sedation during continuous cardiorespiratory monitoring by the radiology RN. The left pedicle at L5 was then infiltrated with 1% lidocaine  followed by the advancement of a Kyphon trocar needle through the left pedicle into the posterior one-third of the vertebral body. Subsequently, the osteo drill was advanced to the anterior third of the vertebral body. The osteo drill was retracted. Through the working cannula, a Kyphon inflatable bone tamp 15 x 2.5 was advanced and positioned with the distal marker approximately 5 mm from the anterior aspect of the cortex. Appropriate positioning was confirmed on the AP projection. At this time, the balloon was expanded using contrast via a Kyphon inflation syringe device via micro tubing. In similar fashion, the right L5 pedicle was infiltrated with 1% lidocaine  followed by the advancement of a  second Kyphon trocar needle through the right pedicle into the posterior third of the vertebral body. Subsequently, the osteo drill was coaxially advanced to the anterior right third. The osteo drill was exchanged for a Kyphon inflatable bone tamp 15 x 2.5, advanced to the 5 mm of the anterior aspect of the cortex. The balloon was then expanded using contrast as above. Inflations were continued until there was near apposition with the superior end plate. At this time, methylmethacrylate mixture was reconstituted in the Kyphon bone mixing device system. This was then loaded into the delivery mechanism, attached to Kyphon bone fillers. The balloons were deflated and removed followed by the instillation of methylmethacrylate mixture with excellent filling in the AP and lateral projections. No extravasation was noted in the disk spaces or posteriorly into the spinal canal. No epidural venous contamination was seen. The working cannulae and the bone filler were then retrieved and removed. Hemostasis was achieved with manual compression. The patient tolerated the procedure well without immediate postprocedural complication. IMPRESSION: 1. Technically successful L5 vertebral body augmentation using balloon kyphoplasty. 2. Per CMS PQRS reporting requirements (PQRS Measure 24): Given the patient's age of greater than 50 and the fracture site (hip, distal radius, or spine), the patient should be tested for osteoporosis using DXA, and the appropriate treatment considered based on the DXA results. Electronically Signed   By: Wilkie Lent M.D.   On: 12/12/2023 10:24   IR Radiologist Eval & Mgmt Result Date: 12/05/2023 EXAM: NEW PATIENT OFFICE VISIT CHIEF COMPLAINT: SEE NOTE IN EPIC HISTORY OF PRESENT ILLNESS: SEE NOTE IN EPIC REVIEW OF SYSTEMS: SEE NOTE IN EPIC PHYSICAL EXAMINATION: SEE NOTE IN EPIC ASSESSMENT AND PLAN: SEE NOTE IN EPIC Electronically Signed   By: Wilkie Lent M.D.   On: 12/05/2023 12:01   MR  LUMBAR SPINE WO CONTRAST Result Date: 12/01/2023 CLINICAL DATA:  Provided history: Lumbar radiculopathy. Additional history provided by the scanning technologist: History of melanoma. EXAM: MRI LUMBAR SPINE WITHOUT CONTRAST TECHNIQUE: Multiplanar, multisequence MR imaging of the lumbar spine was performed. No intravenous contrast was administered. COMPARISON:  Lumbar spine MRI 12/31/2022. CT abdomen/pelvis 06/29/2023. FINDINGS: Segmentation: 5 lumbar vertebrae. The caudal most well-formed intervertebral disc space is designated L5-S1. Alignment: Slight T12-L1 grade 1 retrolisthesis. Slight L1-L2 grade 1 anterolisthesis. Slight trace bony retropulsion  at the level of the L3 superior endplate. Slight L4-L5 grade 1 anterolisthesis. 3-4 mm L5-S1 grade 1 anterolisthesis. Vertebrae: Redemonstrated chronic L3 superior endplate vertebral compression fracture. 30-40% height loss at this level, unchanged from the prior CT abdomen/pelvis of 06/29/2023. Redemonstrated chronic L4 superior endplate vertebral compression fracture. 40% vertebral body height loss at this level, unchanged. L5 superior endplate vertebral compression fracture on the left (with 30-40% vertebral body height loss), new from the prior CT. Marrow edema within the L5 vertebra (abutting the superior endplate). No significant bony retropulsion at this level. Mild degenerative appearing endplate edema at O7-O6 and L3-L4. Conus medullaris and cauda equina: Conus extends to the L1-L2 level. No signal abnormality identified within the visualized distal spinal cord. Paraspinal and other soft tissues: No acute finding within included portions of the abdomen/retroperitoneum. No paraspinal mass or collection. Disc levels: Unless otherwise stated, the level by level findings below have not significantly changed from the prior MRI of 12/31/2022. Multilevel disc degeneration, greatest at L2-L3, L3-L4, L4-L5 and L5-S1 (moderate at these levels). T12-L1: This level is  imaged in the sagittal plane only. Slight grade 1 retrolisthesis. Small disc bulge. Mild facet arthropathy. No significant spinal canal or foraminal stenosis. L1-L2: Slight grade 1 retrolisthesis. Small disc bulge. Mild facet hypertrophy. No significant spinal canal or foraminal stenosis. L2-L3: Slight grade 1 anterolisthesis. Disc bulge asymmetric to the left. Moderate facet arthropathy with bulky ligamentum flavum hypertrophy. Bilateral subarticular stenosis (mild right, moderate left). Mild-to-moderate central canal stenosis. Mild bilateral neural foraminal narrowing (greater on the left). L3-L4: Disc bulge. Moderate facet arthropathy with bulky ligamentum flavum hypertrophy. Severe central canal and bilateral subarticular stenosis, progressed. Moderate bilateral neural foraminal narrowing, progressed. L4-L5: Slight grade 1 anterolisthesis. Disc bulge asymmetric to the right. Moderate facet arthropathy with bulky ligamentum flavum hypertrophy. Mild to moderate bilateral subarticular stenosis (greater on the left). Mild-to-moderate central canal stenosis. Bilateral neural foraminal narrowing (moderate-to-severe right, mild left). L5-S1: Grade 1 anterolisthesis. Disc uncovering with disc bulge. Facet arthropathy (mild right, moderate left). Bulky ligamentum flavum hypertrophy (greater on the left). Mild-to-moderate left subarticular narrowing. Mild relative narrowing of the central canal. Bilateral neural foraminal narrowing (moderate right, severe left). Impression #1 will be called to the ordering clinician or representative by the Radiologist Assistant, and communication documented in the PACS or [employer]. IMPRESSION: 1. L5 superior endplate vertebral compression fracture (with 30-40% vertebral body height loss), new from the prior CT abdomen/pelvis of 06/29/2023. There is marrow edema within the L5 vertebral body (abutting the superior endplate) and this fracture may be acute/subacute. No significant  bony retropulsion at this level. 2. Unchanged chronic L3 and L4 superior endplate vertebral compression fractures. 3. Lumbar spondylosis as outlined within the body of the report. 4. At L3-L4, there is progressive multifactorial severe bilateral subarticular and central canal stenosis. Progressive moderate bilateral neural foraminal narrowing also present at this level. 5. Spondylosis has otherwise not significantly changed. 6. Additional sites of subarticular stenosis, greatest on the left at L2-L3 (moderate), on the left at L4-L5 (mild-to-moderate) and on the left at L5-S1 (mild-to-moderate). 7. Additional sites of central canal stenosis, greatest at L2-L3 and L4-L5 (mild-to-moderate at these levels). 8. Additional sites of foraminal stenosis, greatest on the right at L4-L5 (moderate-to-severe) and on the left at L5-S1 (severe). Electronically Signed   By: Rockey Childs D.O.   On: 12/01/2023 18:32    Labs:  CBC: Recent Labs    12/30/22 1409 05/08/23 1407 09/06/23 1404  WBC 4.5 4.1 4.6  HGB  10.0* 9.9* 10.0*  HCT 30.9* 29.7* 31.0*  PLT 139* 177 124*    COAGS: No results for input(s): INR, APTT in the last 8760 hours.  BMP: Recent Labs    12/30/22 1409 05/08/23 1408 09/06/23 1404  NA 140 140 138  K 4.3 4.3 4.3  CL 105 103 106  CO2 23 25 23   GLUCOSE 327* 330* 182*  BUN 37* 31* 33*  CALCIUM 9.5 9.2 8.8*  CREATININE 1.08* 1.00 1.04*  GFRNONAA 50* 55* 52*    LIVER FUNCTION TESTS: No results for input(s): BILITOT, AST, ALT, ALKPHOS, PROT, ALBUMIN in the last 8760 hours.  TUMOR MARKERS: No results for input(s): AFPTM, CEA, CA199, CHROMGRNA in the last 8760 hours.  Assessment and Plan:  89 year-old female with acute costochondritis of the right anterior costochondral cartilage.  Otherwise doing well and with early improvement in low back pain following L5 KP.   1.) Toradol  30 mg IM 2.) Continue ice, lidocaine  patches and rest as needed 3.) Continue  Aleve at home as needed beginning before bed tonight 4.) F/U with me as planned in 2 weeks     Electronically Signed: Wilkie MARLA Lent 12/14/2023, 10:56 AM   I spent a total of  15 Minutes in face to face in clinical consultation, greater than 50% of which was counseling/coordinating care for right rib pain

## 2023-12-18 NOTE — Progress Notes (Unsigned)
 Referring Physician:  Sadie Manna, MD [address] [facility] [place],  [place] [address]  Primary Physician:  Sadie Manna, MD  History of Present Illness: 12/21/2023 Ms. Kim Ballard is here today with a chief complaint of  chronic back pain who presents with persistent back pain following kyphoplasty. She was referred by a previous doctor for further evaluation.  She has experienced back pain for about a year and a half, with minimal improvement following a recent kyphoplasty. The pain is located in the mid-back and radiates down the leg along the sciatic nerve.  Her back pain is more prominent than her leg pain.   Her back pain has increased since the kyphoplasty and worsens with walking and standing. She is unable to walk for long periods and uses a buggy for support when shopping. Standing for extended periods exacerbates her pain.  She has osteoporosis and spinal arthritis. Home exercises have not provided significant relief, and she has not attended formal physical therapy recently.  She reports numbness in one leg, particularly when lifting it, and increased pain in the buttock area. There is no groin pain, but she experiences back pain when walking up stairs.  Bowel/Bladder Dysfunction: none  Conservative measures:  Physical therapy:  has not participated in PT recently but has been doing Home Exercises  Multimodal medical therapy including regular antiinflammatories:  Gabapentin , Prednisone , Tylenol , Lidocaine  Patches Injections: 12/12/2023 L5 Kyphoplasty 11/24/2023: Left sacroiliac joint injection 10/05/2018: Left lateral gluteal trigger point injection (moderate to good relief) 09/13/2023: Left hip joint injection (minimal relief) 08/29/2023: Left L3-4 and left L4-5 transforaminal ESI (mild relief x 1 to 2 days) 06/27/2023: Left and right ultrasound-guided hip injection with anesthetic only (Dr.Kubinski)  02/27/2023: Left L3-4 and left L4-5  transforaminal ESI (50% relief) 10/21/2022: Left hip joint injection 05/19/2022: Right gluteal trigger point injection (questionable postinjection flare, then good relief?) 03/04/2022: Left L5-S1 and left L4-5 transforaminal ESI 01/07/2022: Bilateral S1 transforaminal ESI (complete relief of her right leg pain; pain now on her left low back and anterior and lateral thigh and shin) 12/24/2021: Left U/S guided SI joint injection by Dr. Sharrie (no relief)   Past Surgery: no spine surgery  Kim Ballard has no symptoms of cervical myelopathy.  The symptoms are causing a significant impact on the patient's life.   I have utilized the care everywhere function in epic to review the outside records available from external health systems.  Review of Systems:  A 10 point review of systems is negative, except for the pertinent positives and negatives detailed in the HPI.  Past Medical History: Past Medical History:  Diagnosis Date   Allergic state    Anemia    Arthritis    Cancer (HCC)    skin cancers   Chronic cystitis with hematuria    Complication of anesthesia 10/28/2022   Excessive night sweats and itching after procedure 10/28/22   Depression    Diabetes mellitus without complication (HCC)    Fibrocystic disease of both breasts    Fibromyalgia    GERD (gastroesophageal reflux disease)    Hypertension    IBS (irritable bowel syndrome)    Melanoma (HCC) 07/09/2021   lt neck/shoulder area   Melanoma of neck (HCC) 09/06/2021   left   Nausea    Skin cancer    Wears dentures    full upper and lower    Past Surgical History: Past Surgical History:  Procedure Laterality Date   ABDOMINAL HYSTERECTOMY  APPENDECTOMY     BREAST BIOPSY Right 2001   surgical bx    CATARACT EXTRACTION W/PHACO Right 03/08/2017   Procedure: CATARACT EXTRACTION PHACO AND INTRAOCULAR LENS PLACEMENT (IOC) RIGHT DIABETIC TORIC;  Surgeon: Mittie Gaskin, MD;  Location: [facility];   Service: Ophthalmology;  Laterality: Right;  Diabetic - oral meds   CATARACT EXTRACTION W/PHACO Left 04/17/2017   Procedure: CATARACT EXTRACTION PHACO AND INTRAOCULAR LENS PLACEMENT (IOC) LEFT DIABETIC;  Surgeon: Mittie Gaskin, MD;  Location: [facility];  Service: Ophthalmology;  Laterality: Left;  diabetic-oral meds   CHOLECYSTECTOMY     COLONOSCOPY     COLONOSCOPY N/A 10/05/2022   Procedure: COLONOSCOPY;  Surgeon: Toledo, Ladell POUR, MD;  Location: [facility] ENDOSCOPY;  Service: Gastroenterology;  Laterality: N/A;   COLONOSCOPY WITH PROPOFOL  N/A 08/27/2015   Procedure: COLONOSCOPY WITH PROPOFOL ;  Surgeon: Lamar ONEIDA Holmes, MD;  Location: [facility] ENDOSCOPY;  Service: Endoscopy;  Laterality: N/A;   COLONOSCOPY WITH PROPOFOL  N/A 05/02/2016   Procedure: COLONOSCOPY WITH PROPOFOL ;  Surgeon: Lamar ONEIDA Holmes, MD;  Location: [facility] ENDOSCOPY;  Service: Endoscopy;  Laterality: N/A;   ESOPHAGOGASTRODUODENOSCOPY     ESOPHAGOGASTRODUODENOSCOPY N/A 10/05/2022   Procedure: ESOPHAGOGASTRODUODENOSCOPY (EGD);  Surgeon: Toledo, Ladell POUR, MD;  Location: [facility] ENDOSCOPY;  Service: Gastroenterology;  Laterality: N/A;   ESOPHAGOGASTRODUODENOSCOPY (EGD) WITH PROPOFOL  N/A 08/27/2015   Procedure: ESOPHAGOGASTRODUODENOSCOPY (EGD) WITH PROPOFOL ;  Surgeon: Lamar ONEIDA Holmes, MD;  Location: [facility] ENDOSCOPY;  Service: Endoscopy;  Laterality: N/A;   EYE SURGERY     HERNIA REPAIR     IR BONE MARROW BIOPSY & ASPIRATION  10/28/2022   IR KYPHO LUMBAR INC FX REDUCE BONE BX UNI/BIL CANNULATION INC/IMAGING  12/12/2023   IR RADIOLOGIST EVAL & MGMT  12/05/2023   IR RADIOLOGIST EVAL & MGMT  12/14/2023   MICROLARYNGOSCOPY Right 11/11/2022   Procedure: MICROLARYNGOSCOPY WITH BIOPSY OF PYRIFORM SINUS MASS;  Surgeon: Herminio Miu, MD;  Location: [facility];  Service: ENT;  Laterality: Right;  Diabetic    Allergies: Allergies as of 12/21/2023 - Review Complete 12/21/2023  Allergen Reaction Noted   Biaxin [clarithromycin]  Other (See Comments) 08/26/2015   Brompheniramine maleate  08/26/2015   Ceclor [cefaclor]  08/26/2015   Ciprofloxacin  08/26/2015   Clinoril [sulindac]  08/26/2015   Codeine sulfate  08/26/2015   Diovan [valsartan]  08/26/2015   Doxycycline  08/26/2015   Erythromycin  08/26/2015   Esomeprazole  08/26/2015   Fluoxetine Other (See Comments) 04/27/2016   Guaifenesin & derivatives  08/26/2015   Levaquin [levofloxacin in d5w]  08/26/2015   Lisinopril  08/26/2015   Lodine [etodolac]  08/26/2015   Macrodantin [nitrofurantoin macrocrystal]  08/26/2015   Medrol  [methylprednisolone ]  08/26/2015   Mobic [meloxicam]  08/26/2015   Motrin [ibuprofen]  08/26/2015   Nexium [esomeprazole magnesium ]  08/26/2015   Nitrofurantoin Other (See Comments) 10/25/2013   Norvasc [amlodipine besylate]  08/26/2015   Omnicef [cefdinir] Other (See Comments) 08/26/2015   Penicillins  08/26/2015   Prednisone  Nausea And Vomiting 11/03/2022   Prozac [fluoxetine hcl] Other (See Comments) 08/26/2015   Pseudoephedrine  08/26/2015   Reglan [metoclopramide]  08/26/2015   Seldane [terfenadine]  08/26/2015   Sulfa antibiotics Other (See Comments) 04/27/2016   Toprol xl [metoprolol tartrate]  08/26/2015   Triamterene  08/26/2015   Tussionex pennkinetic er [hydrocod poli-chlorphe poli er]  08/26/2015   Vibramycin [doxycycline calcium]  08/26/2015   Amlodipine Rash 05/14/2015    Medications:  Current Outpatient Medications:    Ascorbic Acid (VITAMIN C) 1000 MG  tablet, Take 1,000 mg by mouth 2 (two) times daily., Disp: , Rfl:    cephALEXin  (KEFLEX ) 500 MG capsule, Take 1 capsule (500 mg total) by mouth 4 (four) times daily., Disp: 40 capsule, Rfl: 0   cholecalciferol  (VITAMIN D3) 25 MCG (1000 UT) tablet, Take 1,000 Units by mouth 2 (two) times daily., Disp: , Rfl:    cyanocobalamin  (VITAMIN B12) 1000 MCG tablet, Take 1,000 mcg by mouth daily., Disp: , Rfl:    fluticasone (FLONASE) 50 MCG/ACT nasal spray, Place 2 sprays  into both nostrils daily as needed for allergies., Disp: , Rfl:    gemfibrozil  (LOPID ) 600 MG tablet, Take 600 mg by mouth 2 (two) times daily before a meal., Disp: , Rfl:    glipiZIDE (GLUCOTROL XL) 5 MG 24 hr tablet, Take 10 mg by mouth in the morning and at bedtime., Disp: , Rfl:    hydrochlorothiazide  (HYDRODIURIL ) 25 MG tablet, Take 25 mg by mouth daily., Disp: , Rfl:    iron  polysaccharides (NIFEREX) 150 MG capsule, Take 150 mg by mouth daily., Disp: , Rfl:    losartan (COZAAR) 25 MG tablet, Take 25 mg by mouth daily., Disp: , Rfl:    Multiple Vitamins-Minerals (HAIR SKIN & NAILS PO), Take by mouth daily., Disp: , Rfl:    Multiple Vitamins-Minerals (MULTIVITAMIN WITH MINERALS) tablet, Take 1 tablet by mouth daily., Disp: , Rfl:    Omega 3 1000 MG CAPS, Take 1,000 mg by mouth daily., Disp: , Rfl:    pantoprazole  (PROTONIX ) 40 MG tablet, Take 40 mg by mouth daily., Disp: , Rfl:    Probiotic Product (PROBIOTIC PO), Take by mouth daily., Disp: , Rfl:    vitamin E 180 MG (400 UNITS) capsule, Take 400 Units by mouth daily., Disp: , Rfl:   Social History: Social History   Tobacco Use   Smoking status: Former    Current packs/day: 0.00    Types: Cigarettes    Quit date: 1960    Years since quitting: 65.5   Smokeless tobacco: Never  Vaping Use   Vaping status: Never Used  Substance Use Topics   Alcohol use: No   Drug use: No    Family Medical History: Family History  Problem Relation Age of Onset   Hypertension Mother    Ovarian cancer Paternal Grandmother    COPD Father    Hyperlipidemia Father    Kidney disease Father    Diabetes Maternal Grandmother    Breast cancer Neg Hx     Physical Examination: Vitals:   12/21/23 1005  BP: (!) 156/64    General: Patient is in no apparent distress. Attention to examination is appropriate.  Neck:   Supple.  Full range of motion.  Respiratory: Patient is breathing without any difficulty.   NEUROLOGICAL:     Awake, alert,  oriented to person, place, and time.  Speech is clear and fluent.   Cranial Nerves: Pupils equal round and reactive to light.  Facial tone is symmetric.  Facial sensation is symmetric. Shoulder shrug is symmetric. Tongue protrusion is midline.  There is no pronator drift.  Strength: Side Biceps Triceps Deltoid Interossei Grip Wrist Ext. Wrist Flex.  R 5 5 5 5 5 5 5   L 5 5 5 5 5 5 5    Side Iliopsoas Quads Hamstring PF DF EHL  R 5 5 5 5 5 5   L 5 5 5 5 5 5    Reflexes are 1+ and symmetric at the biceps, triceps, brachioradialis, patella and achilles.  Hoffman's is absent.   Bilateral upper and lower extremity sensation is intact to light touch.    No evidence of dysmetria noted.  Gait is antalgic    FABER negative   Medical Decision Making  Imaging: MRI L spine 12/01/2023  IMPRESSION: 1. L5 superior endplate vertebral compression fracture (with 30-40% vertebral body height loss), new from the prior CT abdomen/pelvis of 06/29/2023. There is marrow edema within the L5 vertebral body (abutting the superior endplate) and this fracture may be acute/subacute. No significant bony retropulsion at this level. 2. Unchanged chronic L3 and L4 superior endplate vertebral compression fractures. 3. Lumbar spondylosis as outlined within the body of the report. 4. At L3-L4, there is progressive multifactorial severe bilateral subarticular and central canal stenosis. Progressive moderate bilateral neural foraminal narrowing also present at this level. 5. Spondylosis has otherwise not significantly changed. 6. Additional sites of subarticular stenosis, greatest on the left at L2-L3 (moderate), on the left at L4-L5 (mild-to-moderate) and on the left at L5-S1 (mild-to-moderate). 7. Additional sites of central canal stenosis, greatest at L2-L3 and L4-L5 (mild-to-moderate at these levels). 8. Additional sites of foraminal stenosis, greatest on the right at L4-L5 (moderate-to-severe) and on the  left at L5-S1 (severe).     Electronically Signed   By: Rockey Childs D.O.   On: 12/01/2023 18:32  I have personally reviewed the images and agree with the above interpretation.  Assessment and Plan: Kim Ballard is a pleasant 89 y.o. female with L5 compression fracture that is healing after a kyphoplasty.  She also has back pain with left-sided sciatica.  Chronic back pain with nerve root compression Chronic back pain with nerve root compression, primarily in the middle back, exacerbated by walking and standing. MRI shows areas of narrowing and potential nerve root compression. Recent kyphoplasty performed with some post-procedural soreness. Current primary issue is back pain rather than leg pain. - Contact in three weeks to assess recovery from kyphoplasty. - If pain persists, initiate physical therapy after recovery from kyphoplasty. - Avoid offering surgery for back pain unless leg pain becomes more prominent.   I spent a total of 20 minutes in this patient's care today. This time was spent reviewing pertinent records including imaging studies, obtaining and confirming history, performing a directed evaluation, formulating and discussing my recommendations, and documenting the visit within the medical record.     Thank you for involving me in the care of this patient.      Nalleli Largent K. Clois MD, [facility]

## 2023-12-19 ENCOUNTER — Other Ambulatory Visit: Payer: Self-pay | Admitting: Radiology

## 2023-12-19 ENCOUNTER — Telehealth: Payer: Self-pay

## 2023-12-19 MED ORDER — CEPHALEXIN 500 MG PO CAPS: 500.0000 mg | ORAL_CAPSULE | Freq: Four times a day (QID) | ORAL | 0 refills | Status: DC

## 2023-12-20 ENCOUNTER — Other Ambulatory Visit: Payer: Self-pay | Admitting: Interventional Radiology

## 2023-12-20 DIAGNOSIS — S32050A Wedge compression fracture of fifth lumbar vertebra, initial encounter for closed fracture: Secondary | ICD-10-CM

## 2023-12-21 ENCOUNTER — Ambulatory Visit: Admitting: Neurosurgery

## 2023-12-21 ENCOUNTER — Encounter: Payer: Self-pay | Admitting: Neurosurgery

## 2023-12-21 VITALS — BP 156/64 | Ht 67.0 in | Wt 146.0 lb

## 2023-12-21 DIAGNOSIS — G8929 Other chronic pain: Secondary | ICD-10-CM | POA: Diagnosis not present

## 2023-12-21 DIAGNOSIS — S32050D Wedge compression fracture of fifth lumbar vertebra, subsequent encounter for fracture with routine healing: Secondary | ICD-10-CM

## 2023-12-21 DIAGNOSIS — S32050A Wedge compression fracture of fifth lumbar vertebra, initial encounter for closed fracture: Secondary | ICD-10-CM | POA: Diagnosis not present

## 2023-12-21 DIAGNOSIS — M5442 Lumbago with sciatica, left side: Secondary | ICD-10-CM

## 2024-01-02 ENCOUNTER — Ambulatory Visit
Admission: RE | Admit: 2024-01-02 | Discharge: 2024-01-02 | Disposition: A | Discharge status: Home / Self Care | Source: Ambulatory Visit | Attending: Interventional Radiology | Admitting: Interventional Radiology

## 2024-01-02 DIAGNOSIS — S32050A Wedge compression fracture of fifth lumbar vertebra, initial encounter for closed fracture: Secondary | ICD-10-CM

## 2024-01-02 HISTORY — PX: IR RADIOLOGIST EVAL & MGMT: IMG5224

## 2024-01-02 NOTE — Progress Notes (Incomplete)
 Chief Complaint: L5 vertrebal compression fracture s/p L5 kyphoplasty performed on 7.1.25  Referring Physician(s): Dr. Morene Falcon  History of Present Illness: Kim Ballard is a 89 y.o. female  History significant for melanoma, IBS, HTN, DM, fibromyalgia and iron  deficiency anemia requiring transfusions. Kim Ballard states that she was at a chiropractor in the beginning of January 2024 when she heard a pop. Since that time she has had persistent and worsening back pain. She describes the pain as severe  making her sick on her stomach. . Lumbar Spine dated 6.27.25  reads L5 superior endplate vertebral compression fracture (with 30-40% vertebral body height loss). She failed conservative treatment. IR performed a L5 kyphoplasty on 7.1.25.    Kim Ballard was seen for follow up  on 7.10.25. At that time she reporting right anterior rib pain since her procedure. She was given toradol  IM and recommended that she continue with ice, lidocaine , rest and Aleve. Kim Ballard presents for followup.    On 12/21/2023 she saw Dr. Clois neurosurgeon.  Consideration was made for physical therapy but this should be after she has further healing after her kyphoplasty. 11/24/2023:   Past Medical History:  Diagnosis Date   Allergic state    Anemia    Arthritis    Cancer (HCC)    skin cancers   Chronic cystitis with hematuria    Complication of anesthesia 10/28/2022   Excessive night sweats and itching after procedure 10/28/22   Depression    Diabetes mellitus without complication (HCC)    Fibrocystic disease of both breasts    Fibromyalgia    GERD (gastroesophageal reflux disease)    Hypertension    IBS (irritable bowel syndrome)    Melanoma (HCC) 07/09/2021   lt neck/shoulder area   Melanoma of neck (HCC) 09/06/2021   left   Nausea    Skin cancer    Wears dentures    full upper and lower    Past Surgical History:  Procedure Laterality Date   ABDOMINAL HYSTERECTOMY      APPENDECTOMY     BREAST BIOPSY Right 2001   surgical bx    CATARACT EXTRACTION W/PHACO Right 03/08/2017   Procedure: CATARACT EXTRACTION PHACO AND INTRAOCULAR LENS PLACEMENT (IOC) RIGHT DIABETIC TORIC;  Surgeon: Mittie Gaskin, MD;  Location: [facility];  Service: Ophthalmology;  Laterality: Right;  Diabetic - oral meds   CATARACT EXTRACTION W/PHACO Left 04/17/2017   Procedure: CATARACT EXTRACTION PHACO AND INTRAOCULAR LENS PLACEMENT (IOC) LEFT DIABETIC;  Surgeon: Mittie Gaskin, MD;  Location: [facility];  Service: Ophthalmology;  Laterality: Left;  diabetic-oral meds   CHOLECYSTECTOMY     COLONOSCOPY     COLONOSCOPY N/A 10/05/2022   Procedure: COLONOSCOPY;  Surgeon: Toledo, Ladell POUR, MD;  Location: [facility] ENDOSCOPY;  Service: Gastroenterology;  Laterality: N/A;   COLONOSCOPY WITH PROPOFOL  N/A 08/27/2015   Procedure: COLONOSCOPY WITH PROPOFOL ;  Surgeon: Lamar ONEIDA Holmes, MD;  Location: [facility] END[facility];  Service: Endoscopy;  Laterality: N/A;   COLONOSCOPY WITH PROPOFOL  N/A 05/02/2016   Procedure: COLONOSCOPY WITH PROPOFOL ;  Surgeon: Lamar ONEIDA Holmes, MD;  Location: [facility] END[facility];  Service: Endoscopy;  Laterality: N/A;   ESOPHAGOGASTRODUODENOSCOPY     ESOPHAGOGASTRODUODENOSCOPY N/A 10/05/2022   Procedure: ESOPHAGOGASTRODUODENOSCOPY (EGD);  Surgeon: Toledo, Ladell POUR, MD;  Location: [facility] ENDOSCOPY;  Service: Gastroenterology;  Laterality: N/A;   ESOPHAGOGASTRODUODENOSCOPY (EGD) WITH PROPOFOL  N/A 08/27/2015   Procedure: ESOPHAGOGASTRODUODENOSCOPY (EGD) WITH PROPOFOL ;  Surgeon: Lamar ONEIDA Holmes, MD;  Location: [facility] ENDOSCOPY;  Service: Endoscopy;  Laterality: N/A;   EYE SURGERY     HERNIA REPAIR     IR BONE MARROW BIOPSY & ASPIRATION  10/28/2022   IR KYPHO LUMBAR INC FX REDUCE BONE BX UNI/BIL CANNULATION INC/IMAGING  12/12/2023   IR RADIOLOGIST EVAL & MGMT  12/05/2023   IR RADIOLOGIST EVAL & MGMT  12/14/2023   MICROLARYNGOSCOPY Right 11/11/2022   Procedure: MICROLARYNGOSCOPY  WITH BIOPSY OF PYRIFORM SINUS MASS;  Surgeon: Herminio Miu, MD;  Location: [facility];  Service: ENT;  Laterality: Right;  Diabetic    Allergies: Biaxin [clarithromycin], Brompheniramine maleate, Ceclor [cefaclor], Ciprofloxacin, Clinoril [sulindac], Codeine sulfate, Diovan [valsartan], Doxycycline, Erythromycin, Esomeprazole, Fluoxetine, Guaifenesin & derivatives, Levaquin [levofloxacin in d5w], Lisinopril, Lodine [etodolac], Macrodantin [nitrofurantoin macrocrystal], Medrol  [methylprednisolone ], Mobic [meloxicam], Motrin [ibuprofen], Nexium [esomeprazole magnesium ], Nitrofurantoin, Norvasc [amlodipine besylate], Omnicef [cefdinir], Penicillins, Prednisone , Prozac [fluoxetine hcl], Pseudoephedrine, Reglan [metoclopramide], Seldane [terfenadine], Sulfa antibiotics, Toprol xl [metoprolol tartrate], Triamterene, Tussionex pennkinetic er [hydrocod poli-chlorphe poli er], Vibramycin [doxycycline calcium], and Amlodipine  Medications: Prior to Admission medications   Medication Sig Start Date End Date Taking? Authorizing Provider  Ascorbic Acid (VITAMIN C) 1000 MG tablet Take 1,000 mg by mouth 2 (two) times daily.    [provider]  cephALEXin  (KEFLEX ) 500 MG capsule Take 1 capsule (500 mg total) by mouth 4 (four) times daily. 12/19/23   Sherwood Drivers, PA-C  cholecalciferol  (VITAMIN D3) 25 MCG (1000 UT) tablet Take 1,000 Units by mouth 2 (two) times daily.    [provider]  cyanocobalamin  (VITAMIN B12) 1000 MCG tablet Take 1,000 mcg by mouth daily.    [provider]  fluticasone (FLONASE) 50 MCG/ACT nasal spray Place 2 sprays into both nostrils daily as needed for allergies.    [provider]  gemfibrozil  (LOPID ) 600 MG tablet Take 600 mg by mouth 2 (two) times daily before a meal.    [provider]  glipiZIDE (GLUCOTROL XL) 5 MG 24 hr tablet Take 10 mg by mouth in the morning and at bedtime.    [provider]  hydrochlorothiazide   (HYDRODIURIL ) 25 MG tablet Take 25 mg by mouth daily.    [provider]  iron  polysaccharides (NIFEREX) 150 MG capsule Take 150 mg by mouth daily.    [provider]  losartan (COZAAR) 25 MG tablet Take 25 mg by mouth daily. 11/30/23 11/29/24  [provider]  Multiple Vitamins-Minerals (HAIR SKIN & NAILS PO) Take by mouth daily.    [provider]  Multiple Vitamins-Minerals (MULTIVITAMIN WITH MINERALS) tablet Take 1 tablet by mouth daily.    [provider]  Omega 3 1000 MG CAPS Take 1,000 mg by mouth daily.    [provider]  pantoprazole  (PROTONIX ) 40 MG tablet Take 40 mg by mouth daily.    [provider]  Probiotic Product (PROBIOTIC PO) Take by mouth daily.    [provider]  vitamin E 180 MG (400 UNITS) capsule Take 400 Units by mouth daily.    [provider]     Family History  Problem Relation Age of Onset   Hypertension Mother    Ovarian cancer Paternal Grandmother    COPD Father    Hyperlipidemia Father    Kidney disease Father    Diabetes Maternal Grandmother    Breast cancer Neg Hx     Social History   Socioeconomic History   Marital status: Widowed    Spouse name: Not on file   Number of children: Not on file   Years  of education: Not on file   Highest education level: Not on file  Occupational History   Not on file  Tobacco Use   Smoking status: Former    Current packs/day: 0.00    Types: Cigarettes    Quit date: 40    Years since quitting: 65.6   Smokeless tobacco: Never  Vaping Use   Vaping status: Never Used  Substance and Sexual Activity   Alcohol use: No   Drug use: No   Sexual activity: Not Currently  Other Topics Concern   Not on file  Social History Narrative   Not on file   Social Drivers of Health   Financial Resource Strain: Low Risk  (11/23/2023)   Received from [facility]   Overall Financial Resource Strain (CARDIA)    Difficulty of  Paying Living Expenses: Not hard at all  Food Insecurity: No Food Insecurity (11/23/2023)   Received from [facility]   Hunger Vital Sign    Within the past 12 months, you worried that your food would run out before you got the money to buy more.: Never true    Within the past 12 months, the food you bought just didn't last and you didn't have money to get more.: Never true  Transportation Needs: No Transportation Needs (11/23/2023)   Received from [facility] - Transportation    In the past 12 months, has lack of transportation kept you from medical appointments or from getting medications?: No    Lack of Transportation (Non-Medical): No  Physical Activity: Not on file  Stress: Not on file  Social Connections: Not on file    ECOG Status: {CHL ONC ECOG ED:[id number]}  Review of Systems: A 12 point ROS discussed and pertinent positives are indicated in the HPI above.  All other systems are negative.  Review of Systems  Vital Signs: There were no vitals taken for this visit.  Advance Care Plan: {Advance Care Eojw:73180}    Physical Exam  Mallampati Score:     Imaging: IR Radiologist Eval & Mgmt Result Date: 12/14/2023 EXAM: ESTABLISHED PATIENT OFFICE VISIT CHIEF COMPLAINT: SEE EPIC NOTE HISTORY OF PRESENT ILLNESS: SEE EPIC NOTE REVIEW OF SYSTEMS: SEE EPIC NOTE PHYSICAL EXAMINATION: SEE EPIC NOTE ASSESSMENT AND PLAN: SEE EPIC NOTE Electronically Signed   By: Wilkie Lent M.D.   On: 12/14/2023 10:55   IR KYPHO LUMBAR INC FX REDUCE BONE BX UNI/BIL CANNULATION INC/IMAGING Result Date: 12/12/2023 CLINICAL DATA:  89 year old female with highly symptomatic highly symptomatic osteoporotic compression fracture of the L5 vertebral body. She presents for cement augmentation. EXAM: FLUOROSCOPIC GUIDED KYPHOPLASTY OF THE L5 VERTEBRAL BODY COMPARISON:  None Available. MEDICATIONS: As antibiotic prophylaxis, 1 g vancomycin  was ordered  pre-procedure and administered intravenously by the Radiology nurse within 1 hour of incision. ANESTHESIA/SEDATION: Moderate (conscious) sedation was employed during this procedure. A total of Versed  2 mg and Fentanyl  75 mcg was administered intravenously by the Radiology nurse. Moderate Sedation Time: 23 minutes. The patient's level of consciousness and vital signs were monitored continuously by radiology nursing throughout the procedure under my direct supervision. FLUOROSCOPY TIME:  Radiation exposure index: 56.7 mGy, reference air Karma COMPLICATIONS: None immediate. PROCEDURE: The procedure, risks (including but not limited to bleeding, infection, organ damage), benefits, and alternatives were explained to the patient. Questions regarding the procedure were encouraged and answered. The patient understands and consents to the procedure. The patient has suffered a fracture of the L5 vertebral body.  It is recommended that patients aged 47 years or older be evaluated for possible testing or treatment of osteoporosis. The patient was placed prone on the fluoroscopic table. The skin overlying the lumbar region was then prepped and draped in the usual sterile fashion. Maximal barrier sterile technique was utilized including caps, mask, sterile gowns, sterile gloves, sterile drape, hand hygiene and skin antiseptic. Intravenous Fentanyl  and Versed  were administered as conscious sedation during continuous cardiorespiratory monitoring by the radiology RN. The left pedicle at L5 was then infiltrated with 1% lidocaine  followed by the advancement of a Kyphon trocar needle through the left pedicle into the posterior one-third of the vertebral body. Subsequently, the osteo drill was advanced to the anterior third of the vertebral body. The osteo drill was retracted. Through the working cannula, a Kyphon inflatable bone tamp 15 x 2.5 was advanced and positioned with the distal marker approximately 5 mm from the anterior aspect  of the cortex. Appropriate positioning was confirmed on the AP projection. At this time, the balloon was expanded using contrast via a Kyphon inflation syringe device via micro tubing. In similar fashion, the right L5 pedicle was infiltrated with 1% lidocaine  followed by the advancement of a second Kyphon trocar needle through the right pedicle into the posterior third of the vertebral body. Subsequently, the osteo drill was coaxially advanced to the anterior right third. The osteo drill was exchanged for a Kyphon inflatable bone tamp 15 x 2.5, advanced to the 5 mm of the anterior aspect of the cortex. The balloon was then expanded using contrast as above. Inflations were continued until there was near apposition with the superior end plate. At this time, methylmethacrylate mixture was reconstituted in the Kyphon bone mixing device system. This was then loaded into the delivery mechanism, attached to Kyphon bone fillers. The balloons were deflated and removed followed by the instillation of methylmethacrylate mixture with excellent filling in the AP and lateral projections. No extravasation was noted in the disk spaces or posteriorly into the spinal canal. No epidural venous contamination was seen. The working cannulae and the bone filler were then retrieved and removed. Hemostasis was achieved with manual compression. The patient tolerated the procedure well without immediate postprocedural complication. IMPRESSION: 1. Technically successful L5 vertebral body augmentation using balloon kyphoplasty. 2. Per CMS PQRS reporting requirements (PQRS Measure 24): Given the patient's age of greater than 50 and the fracture site (hip, distal radius, or spine), the patient should be tested for osteoporosis using DXA, and the appropriate treatment considered based on the DXA results. Electronically Signed   By: Wilkie Lent M.D.   On: 12/12/2023 10:24   IR Radiologist Eval & Mgmt Result Date: 12/05/2023 EXAM: NEW  PATIENT OFFICE VISIT CHIEF COMPLAINT: SEE NOTE IN EPIC HISTORY OF PRESENT ILLNESS: SEE NOTE IN EPIC REVIEW OF SYSTEMS: SEE NOTE IN EPIC PHYSICAL EXAMINATION: SEE NOTE IN EPIC ASSESSMENT AND PLAN: SEE NOTE IN EPIC Electronically Signed   By: Wilkie Lent M.D.   On: 12/05/2023 12:01    Labs:  CBC: Recent Labs    05/08/23 1407 09/06/23 1404  WBC 4.1 4.6  HGB 9.9* 10.0*  HCT 29.7* 31.0*  PLT 177 124*    COAGS: No results for input(s): INR, APTT in the last 8760 hours.  BMP: Recent Labs    05/08/23 1408 09/06/23 1404  NA 140 138  K 4.3 4.3  CL 103 106  CO2 25 23  GLUCOSE 330* 182*  BUN 31* 33*  CALCIUM 9.2 8.8*  CREATININE 1.00 1.04*  GFRNONAA 55* 52*    LIVER FUNCTION TESTS: No results for input(s): BILITOT, AST, ALT, ALKPHOS, PROT, ALBUMIN in the last 8760 hours.  TUMOR MARKERS: No results for input(s): AFPTM, CEA, CA199, CHROMGRNA in the last 8760 hours.  Assessment and Plan:  ***  Thank you for this interesting consult.  I greatly enjoyed meeting Kim Ballard and look forward to participating in their care.  A copy of this report was sent to the requesting provider on this date.  Electronically Signed: Delon JAYSON Beagle 01/02/2024, 12:50 PM   I spent a total of {New INPT:[id number]} {New Out-Pt:[id number]}  {Established Out-Pt:304952003} in face to face in clinical consultation, greater than 50% of which was counseling/coordinating care for ***

## 2024-01-02 NOTE — Progress Notes (Signed)
 Chief Complaint: Patient was seen in consultation today for osteoporotic compression fracture of L5 at the request of Romani Wilbon K  Referring Physician(s): Zachory Mangual K  History of Present Illness: Kim Ballard is a 89 y.o. female with an osteoporotic fragility fracture of L5 who underwent kyphoplasty on 12/12/23.  She returns to clinic today for follow-up.   She notes significant improvement.  Her pain is 50-75% improved and has decreased from 10/10 to a 4-5/10.  She has some mild residual soreness and radiation into her left hip. She is sleeping better and returning to full activities.  Continued left lateral calf neuropathy vs radiculopathy, unchanged.    Past Medical History:  Diagnosis Date   Allergic state    Anemia    Arthritis    Cancer (HCC)    skin cancers   Chronic cystitis with hematuria    Complication of anesthesia 10/28/2022   Excessive night sweats and itching after procedure 10/28/22   Depression    Diabetes mellitus without complication (HCC)    Fibrocystic disease of both breasts    Fibromyalgia    GERD (gastroesophageal reflux disease)    Hypertension    IBS (irritable bowel syndrome)    Melanoma (HCC) 07/09/2021   lt neck/shoulder area   Melanoma of neck (HCC) 09/06/2021   left   Nausea    Skin cancer    Wears dentures    full upper and lower    Past Surgical History:  Procedure Laterality Date   ABDOMINAL HYSTERECTOMY     APPENDECTOMY     BREAST BIOPSY Right 2001   surgical bx    CATARACT EXTRACTION W/PHACO Right 03/08/2017   Procedure: CATARACT EXTRACTION PHACO AND INTRAOCULAR LENS PLACEMENT (IOC) RIGHT DIABETIC TORIC;  Surgeon: Mittie Gaskin, MD;  Location: [facility];  Service: Ophthalmology;  Laterality: Right;  Diabetic - oral meds   CATARACT EXTRACTION W/PHACO Left 04/17/2017   Procedure: CATARACT EXTRACTION PHACO AND INTRAOCULAR LENS PLACEMENT (IOC) LEFT DIABETIC;  Surgeon: Mittie Gaskin, MD;   Location: [facility];  Service: Ophthalmology;  Laterality: Left;  diabetic-oral meds   CHOLECYSTECTOMY     COLONOSCOPY     COLONOSCOPY N/A 10/05/2022   Procedure: COLONOSCOPY;  Surgeon: Toledo, Ladell POUR, MD;  Location: [facility] END[facility];  Service: Gastroenterology;  Laterality: N/A;   COLONOSCOPY WITH PROPOFOL  N/A 08/27/2015   Procedure: COLONOSCOPY WITH PROPOFOL ;  Surgeon: Lamar ONEIDA Holmes, MD;  Location: [facility] END[facility];  Service: Endoscopy;  Laterality: N/A;   COLONOSCOPY WITH PROPOFOL  N/A 05/02/2016   Procedure: COLONOSCOPY WITH PROPOFOL ;  Surgeon: Lamar ONEIDA Holmes, MD;  Location: [facility] ENDOSCOPY;  Service: Endoscopy;  Laterality: N/A;   ESOPHAGOGASTRODUODENOSCOPY     ESOPHAGOGASTRODUODENOSCOPY N/A 10/05/2022   Procedure: ESOPHAGOGASTRODUODENOSCOPY (EGD);  Surgeon: Toledo, Ladell POUR, MD;  Location: [facility] ENDOSCOPY;  Service: Gastroenterology;  Laterality: N/A;   ESOPHAGOGASTRODUODENOSCOPY (EGD) WITH PROPOFOL  N/A 08/27/2015   Procedure: ESOPHAGOGASTRODUODENOSCOPY (EGD) WITH PROPOFOL ;  Surgeon: Lamar ONEIDA Holmes, MD;  Location: [facility] ENDOSCOPY;  Service: Endoscopy;  Laterality: N/A;   EYE SURGERY     HERNIA REPAIR     IR BONE MARROW BIOPSY & ASPIRATION  10/28/2022   IR KYPHO LUMBAR INC FX REDUCE BONE BX UNI/BIL CANNULATION INC/IMAGING  12/12/2023   IR RADIOLOGIST EVAL & MGMT  12/05/2023   IR RADIOLOGIST EVAL & MGMT  12/14/2023   IR RADIOLOGIST EVAL & MGMT  01/02/2024   MICROLARYNGOSCOPY Right 11/11/2022   Procedure: MICROLARYNGOSCOPY WITH BIOPSY OF PYRIFORM SINUS MASS;  Surgeon: Herminio Miu, MD;  Location: [facility];  Service: ENT;  Laterality: Right;  Diabetic    Allergies: Biaxin [clarithromycin], Brompheniramine maleate, Ceclor [cefaclor], Ciprofloxacin, Clinoril [sulindac], Codeine sulfate, Diovan [valsartan], Doxycycline, Erythromycin, Esomeprazole, Fluoxetine, Guaifenesin & derivatives, Levaquin [levofloxacin in d5w], Lisinopril, Lodine [etodolac], Macrodantin [nitrofurantoin  macrocrystal], Medrol  [methylprednisolone ], Mobic [meloxicam], Motrin [ibuprofen], Nexium [esomeprazole magnesium ], Nitrofurantoin, Norvasc [amlodipine besylate], Omnicef [cefdinir], Penicillins, Prednisone , Prozac [fluoxetine hcl], Pseudoephedrine, Reglan [metoclopramide], Seldane [terfenadine], Sulfa antibiotics, Toprol xl [metoprolol tartrate], Triamterene, Tussionex pennkinetic er [hydrocod poli-chlorphe poli er], Vibramycin [doxycycline calcium], and Amlodipine  Medications: Prior to Admission medications   Medication Sig Start Date End Date Taking? Authorizing Provider  Ascorbic Acid (VITAMIN C) 1000 MG tablet Take 1,000 mg by mouth 2 (two) times daily.   Yes [provider]  cholecalciferol  (VITAMIN D3) 25 MCG (1000 UT) tablet Take 1,000 Units by mouth 2 (two) times daily.   Yes [provider]  cyanocobalamin  (VITAMIN B12) 1000 MCG tablet Take 1,000 mcg by mouth daily.   Yes [provider]  fluticasone (FLONASE) 50 MCG/ACT nasal spray Place 2 sprays into both nostrils daily as needed for allergies.   Yes [provider]  gemfibrozil  (LOPID ) 600 MG tablet Take 600 mg by mouth 2 (two) times daily before a meal.   Yes [provider]  glipiZIDE (GLUCOTROL XL) 5 MG 24 hr tablet Take 10 mg by mouth in the morning and at bedtime.   Yes [provider]  hydrochlorothiazide  (HYDRODIURIL ) 25 MG tablet Take 25 mg by mouth daily.   Yes [provider]  iron  polysaccharides (NIFEREX) 150 MG capsule Take 150 mg by mouth daily.   Yes [provider]  losartan (COZAAR) 25 MG tablet Take 25 mg by mouth daily. 11/30/23 11/29/24 Yes [provider]  Multiple Vitamins-Minerals (HAIR SKIN & NAILS PO) Take by mouth daily.   Yes [provider]  Multiple Vitamins-Minerals (MULTIVITAMIN WITH MINERALS) tablet Take 1 tablet by mouth daily.   Yes [provider]  Omega 3 1000 MG CAPS Take 1,000 mg by mouth daily.   Yes  [provider]  pantoprazole  (PROTONIX ) 40 MG tablet Take 40 mg by mouth daily.   Yes [provider]  Probiotic Product (PROBIOTIC PO) Take by mouth daily.   Yes [provider]  vitamin E 180 MG (400 UNITS) capsule Take 400 Units by mouth daily.   Yes [provider]  cephALEXin  (KEFLEX ) 500 MG capsule Take 1 capsule (500 mg total) by mouth 4 (four) times daily. Patient not taking: Reported on 01/02/2024 12/19/23   Sherwood Drivers, PA-C     Family History  Problem Relation Age of Onset   Hypertension Mother    Ovarian cancer Paternal Grandmother    COPD Father    Hyperlipidemia Father    Kidney disease Father    Diabetes Maternal Grandmother    Breast cancer Neg Hx     Social History   Socioeconomic History   Marital status: Widowed    Spouse name: Not on file   Number of children: Not on file   Years of education: Not on file   Highest education level: Not on file  Occupational History   Not on file  Tobacco Use   Smoking status: Former    Current packs/day: 0.00    Types: Cigarettes    Quit date: 1960    Years since quitting: 65.6   Smokeless tobacco: Never  Vaping Use   Vaping status: Never Used  Substance and Sexual Activity  Alcohol use: No   Drug use: No   Sexual activity: Not Currently  Other Topics Concern   Not on file  Social History Narrative   Not on file   Social Drivers of Health   Financial Resource Strain: Low Risk  (11/23/2023)   Received from [facility]   Overall Financial Resource Strain (CARDIA)    Difficulty of Paying Living Expenses: Not hard at all  Food Insecurity: No Food Insecurity (11/23/2023)   Received from [facility]   Hunger Vital Sign    Within the past 12 months, you worried that your food would run out before you got the money to buy more.: Never true    Within the past 12 months, the food you bought just didn't last and you didn't have money to get  more.: Never true  Transportation Needs: No Transportation Needs (11/23/2023)   Received from [facility] - Transportation    In the past 12 months, has lack of transportation kept you from medical appointments or from getting medications?: No    Lack of Transportation (Non-Medical): No  Physical Activity: Not on file  Stress: Not on file  Social Connections: Not on file    Review of Systems: A 12 point ROS discussed and pertinent positives are indicated in the HPI above.  All other systems are negative.  Review of Systems  Vital Signs: BP (!) 195/95 (BP Location: Left Arm, Patient Position: Sitting, Cuff Size: Normal)   Pulse 78   Temp 98.5 F (36.9 C) (Oral)   Resp 16   SpO2 98%    Physical Exam Constitutional:      General: She is not in acute distress.    Appearance: Normal appearance. She is normal weight.  HENT:     Head: Normocephalic and atraumatic.  Eyes:     General: No scleral icterus. Pulmonary:     Effort: Pulmonary effort is normal.  Abdominal:     General: Abdomen is flat. There is no distension.     Tenderness: There is no abdominal tenderness.  Musculoskeletal:        General: No swelling or tenderness.  Skin:    General: Skin is warm and dry.     Findings: No erythema or lesion.  Neurological:     Mental Status: She is alert and oriented to person, place, and time.  Psychiatric:        Mood and Affect: Mood normal.        Behavior: Behavior normal.       Imaging: IR Radiologist Eval & Mgmt Result Date: 01/02/2024 EXAM: ESTABLISHED PATIENT OFFICE VISIT CHIEF COMPLAINT: SEE NOTE IN EPIC HISTORY OF PRESENT ILLNESS: SEE NOTE IN EPIC REVIEW OF SYSTEMS: SEE NOTE IN EPIC PHYSICAL EXAMINATION: SEE NOTE IN EPIC ASSESSMENT AND PLAN: SEE NOTE IN EPIC Electronically Signed   By: Wilkie Lent M.D.   On: 01/02/2024 15:32   IR Radiologist Eval & Mgmt Result Date: 12/14/2023 EXAM: ESTABLISHED PATIENT OFFICE VISIT CHIEF  COMPLAINT: SEE EPIC NOTE HISTORY OF PRESENT ILLNESS: SEE EPIC NOTE REVIEW OF SYSTEMS: SEE EPIC NOTE PHYSICAL EXAMINATION: SEE EPIC NOTE ASSESSMENT AND PLAN: SEE EPIC NOTE Electronically Signed   By: Wilkie Lent M.D.   On: 12/14/2023 10:55   IR KYPHO LUMBAR INC FX REDUCE BONE BX UNI/BIL CANNULATION INC/IMAGING Result Date: 12/12/2023 CLINICAL DATA:  89 year old female with highly symptomatic highly symptomatic osteoporotic compression fracture of the L5 vertebral body. She presents  for cement augmentation. EXAM: FLUOROSCOPIC GUIDED KYPHOPLASTY OF THE L5 VERTEBRAL BODY COMPARISON:  None Available. MEDICATIONS: As antibiotic prophylaxis, 1 g vancomycin  was ordered pre-procedure and administered intravenously by the Radiology nurse within 1 hour of incision. ANESTHESIA/SEDATION: Moderate (conscious) sedation was employed during this procedure. A total of Versed  2 mg and Fentanyl  75 mcg was administered intravenously by the Radiology nurse. Moderate Sedation Time: 23 minutes. The patient's level of consciousness and vital signs were monitored continuously by radiology nursing throughout the procedure under my direct supervision. FLUOROSCOPY TIME:  Radiation exposure index: 56.7 mGy, reference air Karma COMPLICATIONS: None immediate. PROCEDURE: The procedure, risks (including but not limited to bleeding, infection, organ damage), benefits, and alternatives were explained to the patient. Questions regarding the procedure were encouraged and answered. The patient understands and consents to the procedure. The patient has suffered a fracture of the L5 vertebral body. It is recommended that patients aged 39 years or older be evaluated for possible testing or treatment of osteoporosis. The patient was placed prone on the fluoroscopic table. The skin overlying the lumbar region was then prepped and draped in the usual sterile fashion. Maximal barrier sterile technique was utilized including caps, mask, sterile gowns,  sterile gloves, sterile drape, hand hygiene and skin antiseptic. Intravenous Fentanyl  and Versed  were administered as conscious sedation during continuous cardiorespiratory monitoring by the radiology RN. The left pedicle at L5 was then infiltrated with 1% lidocaine  followed by the advancement of a Kyphon trocar needle through the left pedicle into the posterior one-third of the vertebral body. Subsequently, the osteo drill was advanced to the anterior third of the vertebral body. The osteo drill was retracted. Through the working cannula, a Kyphon inflatable bone tamp 15 x 2.5 was advanced and positioned with the distal marker approximately 5 mm from the anterior aspect of the cortex. Appropriate positioning was confirmed on the AP projection. At this time, the balloon was expanded using contrast via a Kyphon inflation syringe device via micro tubing. In similar fashion, the right L5 pedicle was infiltrated with 1% lidocaine  followed by the advancement of a second Kyphon trocar needle through the right pedicle into the posterior third of the vertebral body. Subsequently, the osteo drill was coaxially advanced to the anterior right third. The osteo drill was exchanged for a Kyphon inflatable bone tamp 15 x 2.5, advanced to the 5 mm of the anterior aspect of the cortex. The balloon was then expanded using contrast as above. Inflations were continued until there was near apposition with the superior end plate. At this time, methylmethacrylate mixture was reconstituted in the Kyphon bone mixing device system. This was then loaded into the delivery mechanism, attached to Kyphon bone fillers. The balloons were deflated and removed followed by the instillation of methylmethacrylate mixture with excellent filling in the AP and lateral projections. No extravasation was noted in the disk spaces or posteriorly into the spinal canal. No epidural venous contamination was seen. The working cannulae and the bone filler were then  retrieved and removed. Hemostasis was achieved with manual compression. The patient tolerated the procedure well without immediate postprocedural complication. IMPRESSION: 1. Technically successful L5 vertebral body augmentation using balloon kyphoplasty. 2. Per CMS PQRS reporting requirements (PQRS Measure 24): Given the patient's age of greater than 50 and the fracture site (hip, distal radius, or spine), the patient should be tested for osteoporosis using DXA, and the appropriate treatment considered based on the DXA results. Electronically Signed   By: Wilkie Karalee HERO.D.  On: 12/12/2023 10:24   IR Radiologist Eval & Mgmt Result Date: 12/05/2023 EXAM: NEW PATIENT OFFICE VISIT CHIEF COMPLAINT: SEE NOTE IN EPIC HISTORY OF PRESENT ILLNESS: SEE NOTE IN EPIC REVIEW OF SYSTEMS: SEE NOTE IN EPIC PHYSICAL EXAMINATION: SEE NOTE IN EPIC ASSESSMENT AND PLAN: SEE NOTE IN EPIC Electronically Signed   By: Wilkie Lent M.D.   On: 12/05/2023 12:01    Labs:  CBC: Recent Labs    05/08/23 1407 09/06/23 1404  WBC 4.1 4.6  HGB 9.9* 10.0*  HCT 29.7* 31.0*  PLT 177 124*    COAGS: No results for input(s): INR, APTT in the last 8760 hours.  BMP: Recent Labs    05/08/23 1408 09/06/23 1404  NA 140 138  K 4.3 4.3  CL 103 106  CO2 25 23  GLUCOSE 330* 182*  BUN 31* 33*  CALCIUM 9.2 8.8*  CREATININE 1.00 1.04*  GFRNONAA 55* 52*    LIVER FUNCTION TESTS: No results for input(s): BILITOT, AST, ALT, ALKPHOS, PROT, ALBUMIN in the last 8760 hours.  TUMOR MARKERS: No results for input(s): AFPTM, CEA, CA199, CHROMGRNA in the last 8760 hours.  Assessment and Plan:  89 yo female 3 weeks post L5 kyphoplasty.  She had 50-75% improvement in her pain and is overall happy.  Some persistent left peripheral neuropathy.  She is at risk for future osteoporotic compression fractures and needs workup for possible  anabolic treatment to improve bone strength.   1.) Please refer to Dr.  Melissa Solum, endocrinology at [facility] to assess for possible Prolia or other therapy for osteoporosis with recent fragility fracture.   2.) No further scheduled f/u with IR.   Electronically Signed: Wilkie MARLA Lent 01/02/2024, 3:49 PM   I spent a total of  15 Minutes in face to face in clinical consultation, greater than 50% of which was counseling/coordinating care for osteoporotic fragility fracture of L5

## 2024-01-05 ENCOUNTER — Inpatient Hospital Stay (HOSPITAL_BASED_OUTPATIENT_CLINIC_OR_DEPARTMENT_OTHER): Admitting: Internal Medicine

## 2024-01-05 ENCOUNTER — Inpatient Hospital Stay: Attending: Internal Medicine

## 2024-01-05 ENCOUNTER — Encounter: Payer: Self-pay | Admitting: Internal Medicine

## 2024-01-05 ENCOUNTER — Inpatient Hospital Stay

## 2024-01-05 VITALS — BP 176/66 | HR 74 | Temp 96.0°F | Resp 18 | Ht 67.0 in | Wt 143.2 lb

## 2024-01-05 DIAGNOSIS — D649 Anemia, unspecified: Secondary | ICD-10-CM

## 2024-01-05 DIAGNOSIS — D509 Iron deficiency anemia, unspecified: Secondary | ICD-10-CM | POA: Diagnosis present

## 2024-01-05 DIAGNOSIS — D508 Other iron deficiency anemias: Secondary | ICD-10-CM

## 2024-01-05 LAB — BASIC METABOLIC PANEL WITH GFR
Anion gap: 11 (ref 5–15)
BUN: 41 mg/dL — ABNORMAL HIGH (ref 8–23)
CO2: 21 mmol/L — ABNORMAL LOW (ref 22–32)
Calcium: 9.3 mg/dL (ref 8.9–10.3)
Chloride: 106 mmol/L (ref 98–111)
Creatinine, Ser: 1.19 mg/dL — ABNORMAL HIGH (ref 0.44–1.00)
GFR, Estimated: 45 mL/min — ABNORMAL LOW (ref >60)
Glucose, Bld: 194 mg/dL — ABNORMAL HIGH (ref 70–99)
Potassium: 4.4 mmol/L (ref 3.5–5.1)
Sodium: 138 mmol/L (ref 135–145)

## 2024-01-05 LAB — CBC WITH DIFFERENTIAL (CANCER CENTER ONLY)
Abs Immature Granulocytes: 0.02 K/uL (ref 0.00–0.07)
Basophils Absolute: 0 K/uL (ref 0.0–0.1)
Basophils Relative: 1 %
Eosinophils Absolute: 0.1 K/uL (ref 0.0–0.5)
Eosinophils Relative: 3 %
HCT: 28.3 % — ABNORMAL LOW (ref 36.0–46.0)
Hemoglobin: 9.4 g/dL — ABNORMAL LOW (ref 12.0–15.0)
Immature Granulocytes: 1 %
Lymphocytes Relative: 21 %
Lymphs Abs: 0.9 K/uL (ref 0.7–4.0)
MCH: 31.5 pg (ref 26.0–34.0)
MCHC: 33.2 g/dL (ref 30.0–36.0)
MCV: 95 fL (ref 80.0–100.0)
Monocytes Absolute: 0.3 K/uL (ref 0.1–1.0)
Monocytes Relative: 7 %
Neutro Abs: 2.9 K/uL (ref 1.7–7.7)
Neutrophils Relative %: 67 %
Platelet Count: 149 K/uL — ABNORMAL LOW (ref 150–400)
RBC: 2.98 MIL/uL — ABNORMAL LOW (ref 3.87–5.11)
RDW: 13.4 % (ref 11.5–15.5)
WBC Count: 4.3 K/uL (ref 4.0–10.5)
nRBC: 0 % (ref 0.0–0.2)

## 2024-01-05 LAB — IRON AND TIBC
Iron: 63 ug/dL (ref 28–170)
Saturation Ratios: 15 % (ref 10.4–31.8)
TIBC: 421 ug/dL (ref 250–450)
UIBC: 358 ug/dL

## 2024-01-05 LAB — LACTATE DEHYDROGENASE: LDH: 149 U/L (ref 98–192)

## 2024-01-05 LAB — FERRITIN: Ferritin: 44 ng/mL (ref 11–307)

## 2024-01-05 MED ORDER — IRON SUCROSE 20 MG/ML IV SOLN
200.0000 mg | Freq: Once | INTRAVENOUS | Status: AC
  Administered 2024-01-05: 200 mg via INTRAVENOUS
  Filled 2024-01-05: qty 10

## 2024-01-05 NOTE — Progress Notes (Signed)
 [facility] CONSULT NOTE  Patient Care Team: Kim Manna, MD as PCP - General (Internal Medicine) Kim Manna, MD as Physician Assistant (Internal Medicine) Kim Eleanor BROCKS, MD (Inactive) as Consulting Physician (Hematology and Oncology) Kim Kim SAUNDERS, MD as Consulting Physician (Oncology)  CHIEF COMPLAINTS/PURPOSE OF CONSULTATION: Iron  deficiency anemia/thrombocytopenia  #Chronic [2015] mild anemia hemoglobin 11.1-question iron  deficiency versus others.-Dr. Elliott/Dr. Rudell; s/p Venofer . MAY 2024- BONE MARROW, ASPIRATE, CLOT, CORE:  - Normocellular bone marrow (20%) with trilineage hematopoiesis and no  increase in blasts.  Morphologic evaluation and immunohistochemical analysis do not provide a  precise explanation for the patient's anemia and thrombocytopenia. The  absence of significant morphologic dysplasia makes a myelodysplastic  syndrome less likely and secondary causes should be considered; however,  correlation with pending cytogenetic and molecular studies will be of  interest to exclude a morphologically subtle evolving myeloid neoplasm.   #Mild thrombocytopenia platelets 120s  #Fatigue/ PN/DM; MELANOMA of mid back [May 2021] s/p excision. MELANOMA LEFT neck posterior-s/p excision.[JAN 2023 ]  Oncology History   No history exists.    HISTORY OF PRESENTING ILLNESS: Alone.  Ambulating independently.  Kim Ballard 89 y.o.  female longstanding history of iron  deficient anemia/thrombocytopenia- chronic fatigue is here for follow-up.   Patient states she's recovering from s/p kyphoplasty in July 2025-  no new or acute concerns. Noted to have improvement of pain.  Patient continues to complains of ongoing fatigue,   Review of Systems  Constitutional:  Positive for malaise/fatigue. Negative for chills, diaphoresis, fever and weight loss.  HENT:  Negative for nosebleeds and sore throat.   Eyes:  Negative for double vision.   Respiratory:  Negative for cough, hemoptysis, sputum production, shortness of breath and wheezing.   Cardiovascular:  Negative for chest pain, palpitations, orthopnea and leg swelling.  Gastrointestinal:  Positive for nausea. Negative for blood in stool, constipation, diarrhea, heartburn, melena and vomiting.  Musculoskeletal:  Negative for back pain and joint pain.  Skin: Negative.  Negative for itching and rash.  Neurological:  Positive for tingling. Negative for dizziness, focal weakness, weakness and headaches.  Endo/Heme/Allergies:  Does not bruise/bleed easily.  Psychiatric/Behavioral:  Negative for depression. The patient is not nervous/anxious and does not have insomnia.      MEDICAL HISTORY:  Past Medical History:  Diagnosis Date   Allergic state    Anemia    Arthritis    Cancer (HCC)    skin cancers   Chronic cystitis with hematuria    Complication of anesthesia 10/28/2022   Excessive night sweats and itching after procedure 10/28/22   Depression    Diabetes mellitus without complication (HCC)    Fibrocystic disease of both breasts    Fibromyalgia    GERD (gastroesophageal reflux disease)    Hypertension    IBS (irritable bowel syndrome)    Melanoma (HCC) 07/09/2021   lt neck/shoulder area   Melanoma of neck (HCC) 09/06/2021   left   Nausea    Skin cancer    Wears dentures    full upper and lower    SURGICAL HISTORY: Past Surgical History:  Procedure Laterality Date   ABDOMINAL HYSTERECTOMY     APPENDECTOMY     BREAST BIOPSY Right 2001   surgical bx    CATARACT EXTRACTION W/PHACO Right 03/08/2017   Procedure: CATARACT EXTRACTION PHACO AND INTRAOCULAR LENS PLACEMENT (IOC) RIGHT DIABETIC TORIC;  Surgeon: Kim Gaskin, MD;  Location: [facility];  Service: Ophthalmology;  Laterality: Right;  Diabetic - oral  meds   CATARACT EXTRACTION W/PHACO Left 04/17/2017   Procedure: CATARACT EXTRACTION PHACO AND INTRAOCULAR LENS PLACEMENT (IOC) LEFT  DIABETIC;  Surgeon: Kim Gaskin, MD;  Location: [facility];  Service: Ophthalmology;  Laterality: Left;  diabetic-oral meds   CHOLECYSTECTOMY     COLONOSCOPY     COLONOSCOPY N/A 10/05/2022   Procedure: COLONOSCOPY;  Surgeon: Toledo, Kim POUR, MD;  Location: [facility] ENDOSCOPY;  Service: Gastroenterology;  Laterality: N/A;   COLONOSCOPY WITH PROPOFOL  N/A 08/27/2015   Procedure: COLONOSCOPY WITH PROPOFOL ;  Surgeon: Kim ONEIDA Holmes, MD;  Location: [facility] END[facility]COPY;  Service: Endoscopy;  Laterality: N/A;   COLONOSCOPY WITH PROPOFOL  N/A 05/02/2016   Procedure: COLONOSCOPY WITH PROPOFOL ;  Surgeon: Kim ONEIDA Holmes, MD;  Location[facility];  Service: Endoscopy;  Laterality: N/A;   ESOPHAGOGASTRODUODENOSCOPY     ESOPHAGOGASTRODUODENOSCOPY N/A 10/05/2022   Procedure: ESOPHAGOGASTRODUODENOSCOPY (EGD);  Surgeon: Toledo, Kim POUR, MD;  Location: [facility] ENDOSCOPY;  Service: Gastroenterology;  Laterality: N/A;   ESOPHAGOGASTRODUODENOSCOPY (EGD) WITH PROPOFOL  N/A 08/27/2015   Procedure: ESOPHAGOGASTRODUODENOSCOPY (EGD) WITH PROPOFOL ;  Surgeon: Kim ONEIDA Holmes, MD;  Location: [facility] ENDOSCOPY;  Service: Endoscopy;  Laterality: N/A;   EYE SURGERY     HERNIA REPAIR     IR BONE MARROW BIOPSY & ASPIRATION  10/28/2022   IR KYPHO LUMBAR INC FX REDUCE BONE BX UNI/BIL CANNULATION INC/IMAGING  12/12/2023   IR RADIOLOGIST EVAL & MGMT  12/05/2023   IR RADIOLOGIST EVAL & MGMT  12/14/2023   IR RADIOLOGIST EVAL & MGMT  01/02/2024   MICROLARYNGOSCOPY Right 11/11/2022   Procedure: MICROLARYNGOSCOPY WITH BIOPSY OF PYRIFORM SINUS MASS;  Surgeon: Kim Miu, MD;  Location: [facility];  Service: ENT;  Laterality: Right;  Diabetic    SOCIAL HISTORY: Social History   Socioeconomic History   Marital status: Widowed    Spouse name: Not on file   Number of children: Not on file   Years of education: Not on file   Highest education level: Not on file  Occupational History   Not on file  Tobacco Use    Smoking status: Former    Current packs/day: 0.00    Types: Cigarettes    Quit date: 61    Years since quitting: 65.6   Smokeless tobacco: Never  Vaping Use   Vaping status: Never Used  Substance and Sexual Activity   Alcohol use: No   Drug use: No   Sexual activity: Not Currently  Other Topics Concern   Not on file  Social History Narrative   Not on file   Social Drivers of Health   Financial Resource Strain: Low Risk  (11/23/2023)   Received from [facility]   Overall Financial Resource Strain (CARDIA)    Difficulty of Paying Living Expenses: Not hard at all  Food Insecurity: No Food Insecurity (11/23/2023)   Received from [facility]   Hunger Vital Sign    Within the past 12 months, you worried that your food would run out before you got the money to buy more.: Never true    Within the past 12 months, the food you bought just didn't last and you didn't have money to get more.: Never true  Transportation Needs: No Transportation Needs (11/23/2023)   Received from [facility] - Transportation    In the past 12 months, has lack of transportation kept you from medical appointments or from getting medications?: No    Lack of Transportation (Non-Medical): No  Physical  Activity: Not on file  Stress: Not on file  Social Connections: Not on file  Intimate Partner Violence: Not At Risk (05/24/2022)   Humiliation, Afraid, Rape, and Kick questionnaire    Fear of Current or Ex-Partner: No    Emotionally Abused: No    Physically Abused: No    Sexually Abused: No    FAMILY HISTORY: Family History  Problem Relation Age of Onset   Hypertension Mother    Ovarian cancer Paternal Grandmother    COPD Father    Hyperlipidemia Father    Kidney disease Father    Diabetes Maternal Grandmother    Breast cancer Neg Hx     ALLERGIES:  is allergic to biaxin [clarithromycin], brompheniramine maleate, ceclor [cefaclor],  ciprofloxacin, clinoril [sulindac], codeine sulfate, diovan [valsartan], doxycycline, erythromycin, esomeprazole, fluoxetine, guaifenesin & derivatives, levaquin [levofloxacin in d5w], lisinopril, lodine [etodolac], macrodantin [nitrofurantoin macrocrystal], medrol  [methylprednisolone ], mobic [meloxicam], motrin [ibuprofen], nexium [esomeprazole magnesium ], nitrofurantoin, norvasc [amlodipine besylate], omnicef [cefdinir], penicillins, prednisone , prozac [fluoxetine hcl], pseudoephedrine, reglan [metoclopramide], seldane [terfenadine], sulfa antibiotics, toprol xl [metoprolol tartrate], triamterene, tussionex pennkinetic er [hydrocod poli-chlorphe poli er], vibramycin [doxycycline calcium], and amlodipine.  MEDICATIONS:  Current Outpatient Medications  Medication Sig Dispense Refill   Ascorbic Acid (VITAMIN C) 1000 MG tablet Take 1,000 mg by mouth 2 (two) times daily.     cephALEXin  (KEFLEX ) 500 MG capsule Take 1 capsule (500 mg total) by mouth 4 (four) times daily. (Patient not taking: Reported on 01/05/2024) 40 capsule 0   cholecalciferol  (VITAMIN D3) 25 MCG (1000 UT) tablet Take 1,000 Units by mouth 2 (two) times daily.     cyanocobalamin  (VITAMIN B12) 1000 MCG tablet Take 1,000 mcg by mouth daily.     fluticasone (FLONASE) 50 MCG/ACT nasal spray Place 2 sprays into both nostrils daily as needed for allergies.     gemfibrozil  (LOPID ) 600 MG tablet Take 600 mg by mouth 2 (two) times daily before a meal.     glipiZIDE (GLUCOTROL XL) 5 MG 24 hr tablet Take 10 mg by mouth in the morning and at bedtime.     hydrochlorothiazide  (HYDRODIURIL ) 25 MG tablet Take 25 mg by mouth daily.     iron  polysaccharides (NIFEREX) 150 MG capsule Take 150 mg by mouth daily.     losartan (COZAAR) 25 MG tablet Take 25 mg by mouth daily.     metFORMIN (GLUCOPHAGE-XR) 500 MG 24 hr tablet Take 1,000 mg by mouth 2 (two) times daily.     Multiple Vitamins-Minerals (HAIR SKIN & NAILS PO) Take by mouth daily.     Multiple  Vitamins-Minerals (MULTIVITAMIN WITH MINERALS) tablet Take 1 tablet by mouth daily.     Omega 3 1000 MG CAPS Take 1,000 mg by mouth daily.     pantoprazole  (PROTONIX ) 40 MG tablet Take 40 mg by mouth daily.     Probiotic Product (PROBIOTIC PO) Take by mouth daily.     vitamin E 180 MG (400 UNITS) capsule Take 400 Units by mouth daily.     No current facility-administered medications for this visit.   Facility-Administered Medications Ordered in Other Visits  Medication Dose Route Frequency Provider Last Rate Last Admin   iron  sucrose (VENOFER ) injection 200 mg  200 mg Intravenous Once Kim Veracruz R, MD          .  PHYSICAL EXAMINATION: ECOG PERFORMANCE STATUS: 0 - Asymptomatic  Vitals:   01/05/24 1528 01/05/24 1533  BP: (!) 175/69 (!) 176/66  Pulse: 74   Resp: 18   Temp: (!)  96 F (35.6 C)   SpO2: 100%      Filed Weights   01/05/24 1528  Weight: 143 lb 3.2 oz (65 kg)      Physical Exam Constitutional:      Comments: Patient is alone.  Ambulating dependently.  HENT:     Head: Normocephalic and atraumatic.     Mouth/Throat:     Pharynx: No oropharyngeal exudate.  Eyes:     Pupils: Pupils are equal, round, and reactive to light.  Cardiovascular:     Rate and Rhythm: Normal rate and regular rhythm.  Pulmonary:     Effort: Pulmonary effort is normal. No respiratory distress.     Breath sounds: Normal breath sounds. No wheezing.  Abdominal:     General: Bowel sounds are normal. There is no distension.     Palpations: Abdomen is soft. There is no mass.     Tenderness: There is no abdominal tenderness. There is no guarding or rebound.  Musculoskeletal:        General: No tenderness. Normal range of motion.     Cervical back: Normal range of motion and neck supple.  Skin:    General: Skin is warm.  Neurological:     Mental Status: She is alert and oriented to person, place, and time.  Psychiatric:        Mood and Affect: Affect normal.    LABORATORY  DATA:  I have reviewed the data as listed Lab Results  Component Value Date   WBC 4.3 01/05/2024   HGB 9.4 (L) 01/05/2024   HCT 28.3 (L) 01/05/2024   MCV 95.0 01/05/2024   PLT 149 (L) 01/05/2024   Recent Labs    05/08/23 1408 09/06/23 1404 01/05/24 1514  NA 140 138 138  K 4.3 4.3 4.4  CL 103 106 106  CO2 25 23 21*  GLUCOSE 330* 182* 194*  BUN 31* 33* 41*  CREATININE 1.00 1.04* 1.19*  CALCIUM 9.2 8.8* 9.3  GFRNONAA 55* 52* 45*    RADIOGRAPHIC STUDIES: I have personally reviewed the radiological images as listed and agreed with the findings in the report. IR Radiologist Eval & Mgmt Result Date: 01/02/2024 EXAM: ESTABLISHED PATIENT OFFICE VISIT CHIEF COMPLAINT: SEE NOTE IN EPIC HISTORY OF PRESENT ILLNESS: SEE NOTE IN EPIC REVIEW OF SYSTEMS: SEE NOTE IN EPIC PHYSICAL EXAMINATION: SEE NOTE IN EPIC ASSESSMENT AND PLAN: SEE NOTE IN EPIC Electronically Signed   By: Kim Ballard M.D.   On: 01/02/2024 15:32   IR Radiologist Eval & Mgmt Result Date: 12/14/2023 EXAM: ESTABLISHED PATIENT OFFICE VISIT CHIEF COMPLAINT: SEE EPIC NOTE HISTORY OF PRESENT ILLNESS: SEE EPIC NOTE REVIEW OF SYSTEMS: SEE EPIC NOTE PHYSICAL EXAMINATION: SEE EPIC NOTE ASSESSMENT AND PLAN: SEE EPIC NOTE Electronically Signed   By: Kim Ballard M.D.   On: 12/14/2023 10:55   IR KYPHO LUMBAR INC FX REDUCE BONE BX UNI/BIL CANNULATION INC/IMAGING Result Date: 12/12/2023 CLINICAL DATA:  89 year old female with highly symptomatic highly symptomatic osteoporotic compression fracture of the L5 vertebral body. She presents for cement augmentation. EXAM: FLUOROSCOPIC GUIDED KYPHOPLASTY OF THE L5 VERTEBRAL BODY COMPARISON:  None Available. MEDICATIONS: As antibiotic prophylaxis, 1 g vancomycin  was ordered pre-procedure and administered intravenously by the Radiology nurse within 1 hour of incision. ANESTHESIA/SEDATION: Moderate (conscious) sedation was employed during this procedure. A total of Versed  2 mg and Fentanyl  75 mcg  was administered intravenously by the Radiology nurse. Moderate Sedation Time: 23 minutes. The patient's level of consciousness and vital signs were monitored  continuously by radiology nursing throughout the procedure under my direct supervision. FLUOROSCOPY TIME:  Radiation exposure index: 56.7 mGy, reference air Karma COMPLICATIONS: None immediate. PROCEDURE: The procedure, risks (including but not limited to bleeding, infection, organ damage), benefits, and alternatives were explained to the patient. Questions regarding the procedure were encouraged and answered. The patient understands and consents to the procedure. The patient has suffered a fracture of the L5 vertebral body. It is recommended that patients aged 49 years or older be evaluated for possible testing or treatment of osteoporosis. The patient was placed prone on the fluoroscopic table. The skin overlying the lumbar region was then prepped and draped in the usual sterile fashion. Maximal barrier sterile technique was utilized including caps, mask, sterile gowns, sterile gloves, sterile drape, hand hygiene and skin antiseptic. Intravenous Fentanyl  and Versed  were administered as conscious sedation during continuous cardiorespiratory monitoring by the radiology RN. The left pedicle at L5 was then infiltrated with 1% lidocaine  followed by the advancement of a Kyphon trocar needle through the left pedicle into the posterior one-third of the vertebral body. Subsequently, the osteo drill was advanced to the anterior third of the vertebral body. The osteo drill was retracted. Through the working cannula, a Kyphon inflatable bone tamp 15 x 2.5 was advanced and positioned with the distal marker approximately 5 mm from the anterior aspect of the cortex. Appropriate positioning was confirmed on the AP projection. At this time, the balloon was expanded using contrast via a Kyphon inflation syringe device via micro tubing. In similar fashion, the right L5 pedicle  was infiltrated with 1% lidocaine  followed by the advancement of a second Kyphon trocar needle through the right pedicle into the posterior third of the vertebral body. Subsequently, the osteo drill was coaxially advanced to the anterior right third. The osteo drill was exchanged for a Kyphon inflatable bone tamp 15 x 2.5, advanced to the 5 mm of the anterior aspect of the cortex. The balloon was then expanded using contrast as above. Inflations were continued until there was near apposition with the superior end plate. At this time, methylmethacrylate mixture was reconstituted in the Kyphon bone mixing device system. This was then loaded into the delivery mechanism, attached to Kyphon bone fillers. The balloons were deflated and removed followed by the instillation of methylmethacrylate mixture with excellent filling in the AP and lateral projections. No extravasation was noted in the disk spaces or posteriorly into the spinal canal. No epidural venous contamination was seen. The working cannulae and the bone filler were then retrieved and removed. Hemostasis was achieved with manual compression. The patient tolerated the procedure well without immediate postprocedural complication. IMPRESSION: 1. Technically successful L5 vertebral body augmentation using balloon kyphoplasty. 2. Per CMS PQRS reporting requirements (PQRS Measure 24): Given the patient's age of greater than 50 and the fracture site (hip, distal radius, or spine), the patient should be tested for osteoporosis using DXA, and the appropriate treatment considered based on the DXA results. Electronically Signed   By: Kim Ballard M.D.   On: 12/12/2023 10:24    ASSESSMENT & PLAN:   Symptomatic anemia  #Chronic mild anemia-hemoglobin  10-11; DEC  2024- Iron  sat-16% ; ferritin- 68  Not totally consistent with iron  deficiency- ? CKD stage III-  # MAY 2024-bone marrow biopsy-not suggestive of myelodysplastic syndrome.  Cytogenetics- Trismy  15/NGS- NEG; . MAY 2024-S/p EGD/coloscopy [Dr.Toledo];   # Today hemoglobin is 9.4 recommend proceeding with iron  infusion today. Consider retacrit at next visit-  Pending iron   studies/ferritin   # ITP/ Thrombocytopenia-platelet count- > 100 -stable. on surveillance.  # Elevated HTN- keep a log BPs; Repeat 180-190/ . On  Losartan- And defer to PCP, Dr.Hande. stable.   # CKD stage III- monitor closely with BP. Hydration.   # DISPOSITION: # venofer  today; no retacrit # follow up in 3 months  MD: labs- cbc/bmp; LDH;  iron  studies/ferritin---possible Venofer  OR Retacrit- -Dr.B      Kim JONELLE Joe, MD 01/05/2024 3:54 PM

## 2024-01-05 NOTE — Progress Notes (Signed)
 Patient states she's recovering from back surgery with no new or acute concerns.

## 2024-01-05 NOTE — Addendum Note (Signed)
 Addended by: LAEL BROWNING A on: 01/05/2024 04:07 PM   Modules accepted: Orders

## 2024-01-05 NOTE — Assessment & Plan Note (Addendum)
#  Chronic mild anemia-hemoglobin  10-11; DEC  2024- Iron  sat-16% ; ferritin- 68  Not totally consistent with iron  deficiency- ? CKD stage III-  # MAY 2024-bone marrow biopsy-not suggestive of myelodysplastic syndrome.  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 MAY 2024-S/p EGD/coloscopy [Dr.Toledo];   # Today hemoglobin is 9.4 recommend proceeding with iron  infusion today. Consider retacrit at next visit-  Pending iron  studies/ferritin   # ITP/ Thrombocytopenia-platelet count- > 100 -stable. on surveillance.  # Elevated HTN- keep a log BPs; Repeat 180-190/ . On  Losartan- And defer to PCP, Dr.Hande. stable.   # CKD stage III- monitor closely with BP. Hydration.   # DISPOSITION: # venofer  today; no retacrit # follow up in 3 months  MD: labs- cbc/bmp; LDH;  iron  studies/ferritin---possible Venofer  OR Retacrit- -Dr.B

## 2024-01-24 ENCOUNTER — Telehealth: Payer: Self-pay

## 2024-01-24 NOTE — Telephone Encounter (Addendum)
-----   Message from [facility] sent at 01/08/2024 9:00 AM EDT ----- Please check with patient and see if her back pain is improving.  If not, please send outpatient referral for physical therapy to [place] outpatient therapy at the hospital.  I would then want to see her back 6 to 8 weeks after that.  Thanks

## 2024-01-24 NOTE — Telephone Encounter (Signed)
 Left message to return call on perferred # (mobile).

## 2024-02-09 NOTE — Telephone Encounter (Signed)
 Saw Dr Avanell on 01/22/24 and received an ESI yesterday. His note states she declined PT.

## 2024-02-27 ENCOUNTER — Other Ambulatory Visit: Payer: Self-pay | Admitting: Internal Medicine

## 2024-02-27 DIAGNOSIS — M5442 Lumbago with sciatica, left side: Secondary | ICD-10-CM

## 2024-02-27 DIAGNOSIS — M546 Pain in thoracic spine: Secondary | ICD-10-CM

## 2024-02-27 DIAGNOSIS — M545 Low back pain, unspecified: Secondary | ICD-10-CM

## 2024-02-29 ENCOUNTER — Ambulatory Visit
Admission: RE | Admit: 2024-02-29 | Discharge: 2024-02-29 | Disposition: A | Discharge status: Home / Self Care | Source: Ambulatory Visit | Attending: Internal Medicine | Admitting: Internal Medicine

## 2024-02-29 DIAGNOSIS — M5442 Lumbago with sciatica, left side: Secondary | ICD-10-CM | POA: Insufficient documentation

## 2024-02-29 DIAGNOSIS — M545 Low back pain, unspecified: Secondary | ICD-10-CM | POA: Diagnosis present

## 2024-02-29 DIAGNOSIS — M546 Pain in thoracic spine: Secondary | ICD-10-CM | POA: Insufficient documentation

## 2024-02-29 MED ORDER — GADOBUTROL 1 MMOL/ML IV SOLN
6.0000 mL | Freq: Once | INTRAVENOUS | Status: AC | PRN
  Administered 2024-02-29: 6 mL via INTRAVENOUS

## 2024-03-07 ENCOUNTER — Other Ambulatory Visit: Payer: Self-pay | Admitting: Physical Medicine and Rehabilitation

## 2024-03-07 ENCOUNTER — Ambulatory Visit
Admission: RE | Admit: 2024-03-07 | Discharge: 2024-03-07 | Disposition: A | Discharge status: Home / Self Care | Source: Ambulatory Visit | Attending: Physical Medicine and Rehabilitation | Admitting: Physical Medicine and Rehabilitation

## 2024-03-07 DIAGNOSIS — M5416 Radiculopathy, lumbar region: Secondary | ICD-10-CM | POA: Insufficient documentation

## 2024-03-11 ENCOUNTER — Other Ambulatory Visit: Payer: Self-pay | Admitting: Physical Medicine and Rehabilitation

## 2024-03-11 DIAGNOSIS — S22000A Wedge compression fracture of unspecified thoracic vertebra, initial encounter for closed fracture: Secondary | ICD-10-CM

## 2024-03-12 ENCOUNTER — Telehealth: Payer: Self-pay

## 2024-03-13 NOTE — Consult Note (Signed)
 Chief Complaint: Patient was seen in consultation today for T11 compression fracture  Referring Physician(s): Chasnis,Benjamin  Supervising Physician: Karalee Beat  Patient Status: [facility] - Outpatient   History of Present Illness: Kim Ballard is an 89 y.o. female with a medical history significant for anemia, DM, depression, fibromyalgia, HTN, IBS, melanoma, falls and L5 lumbar compression fracture. She is familiar to IR from a parotic cystic lesion FNA (2023), bone marrow biopsy with aspiration (2024) and L5 kyphoplasty 12/12/23.   The kyphoplasty was performed by Dr. Karalee and at her last follow up visit with him 01/02/24 she reported a significant decrease in her back pain. Unfortunately in mid-September she developed a sinus infection with a severe cough. During a coughing episode she felt a pop in her back followed by severe onset of mid-to-low back pain. An MRI lumbar spine was obtained 02/29/24 and this was negative for acute findings.   She followed up with her PCP 03/07/24 and endorsed continued, severe pain. Gentle palpation over the lower thoracic/upper lumbar back area elicited moderate to severe pain and an MRI of the thoracic spine was ordered. This showed an early, subacute wedge compression fracture of T11.   The patient has been kindly referred back to Interventional Radiology to discuss a T11 kyphoplasty. She presents to the clinic today and states she has 8/10 pain at rest and 10/10 pain with activity. She lives alone and is having an incredibly difficult time performing her ADLs. She is taking tylenol  around the clock and uses ice/heat when she can.   Past Medical History:  Diagnosis Date   Allergic state    Anemia    Arthritis    Cancer (HCC)    skin cancers   Chronic cystitis with hematuria    Complication of anesthesia 10/28/2022   Excessive night sweats and itching after procedure 10/28/22   Depression    Diabetes mellitus without  complication (HCC)    Fibrocystic disease of both breasts    Fibromyalgia    GERD (gastroesophageal reflux disease)    Hypertension    IBS (irritable bowel syndrome)    Melanoma (HCC) 07/09/2021   lt neck/shoulder area   Melanoma of neck (HCC) 09/06/2021   left   Nausea    Skin cancer    Wears dentures    full upper and lower    Past Surgical History:  Procedure Laterality Date   ABDOMINAL HYSTERECTOMY     APPENDECTOMY     BREAST BIOPSY Right 2001   surgical bx    CATARACT EXTRACTION W/PHACO Right 03/08/2017   Procedure: CATARACT EXTRACTION PHACO AND INTRAOCULAR LENS PLACEMENT (IOC) RIGHT DIABETIC TORIC;  Surgeon: Mittie Gaskin, MD;  Location: [facility];  Service: Ophthalmology;  Laterality: Right;  Diabetic - oral meds   CATARACT EXTRACTION W/PHACO Left 04/17/2017   Procedure: CATARACT EXTRACTION PHACO AND INTRAOCULAR LENS PLACEMENT (IOC) LEFT DIABETIC;  Surgeon: Mittie Gaskin, MD;  Location: [facility];  Service: Ophthalmology;  Laterality: Left;  diabetic-oral meds   CHOLECYSTECTOMY     COLONOSCOPY     COLONOSCOPY N/A 10/05/2022   Procedure: COLONOSCOPY;  Surgeon: Toledo, Ladell POUR, MD;  Location: [facility] ENDOSCOPY;  Service: Gastroenterology;  Laterality: N/A;   COLONOSCOPY WITH PROPOFOL  N/A 08/27/2015   Procedure: COLONOSCOPY WITH PROPOFOL ;  Surgeon: Lamar ONEIDA Holmes, MD;  Location: [facility] ENDOSCOPY;  Service: Endoscopy;  Laterality: N/A;   COLONOSCOPY WITH PROPOFOL  N/A 05/02/2016   Procedure: COLONOSCOPY WITH PROPOFOL ;  Surgeon: Lamar ONEIDA Holmes, MD;  Location:  [facility] ENDOSCOPY;  Service: Endoscopy;  Laterality: N/A;   ESOPHAGOGASTRODUODENOSCOPY     ESOPHAGOGASTRODUODENOSCOPY N/A 10/05/2022   Procedure: ESOPHAGOGASTRODUODENOSCOPY (EGD);  Surgeon: Toledo, Ladell POUR, MD;  Location: [facility] ENDOSCOPY;  Service: Gastroenterology;  Laterality: N/A;   ESOPHAGOGASTRODUODENOSCOPY (EGD) WITH PROPOFOL  N/A 08/27/2015   Procedure: ESOPHAGOGASTRODUODENOSCOPY (EGD)  WITH PROPOFOL ;  Surgeon: Lamar ONEIDA Holmes, MD;  Location: [facility] ENDOSCOPY;  Service: Endoscopy;  Laterality: N/A;   EYE SURGERY     HERNIA REPAIR     IR BONE MARROW BIOPSY & ASPIRATION  10/28/2022   IR KYPHO LUMBAR INC FX REDUCE BONE BX UNI/BIL CANNULATION INC/IMAGING  12/12/2023   IR RADIOLOGIST EVAL & MGMT  12/05/2023   IR RADIOLOGIST EVAL & MGMT  12/14/2023   IR RADIOLOGIST EVAL & MGMT  01/02/2024   MICROLARYNGOSCOPY Right 11/11/2022   Procedure: MICROLARYNGOSCOPY WITH BIOPSY OF PYRIFORM SINUS MASS;  Surgeon: Herminio Miu, MD;  Location: [facility];  Service: ENT;  Laterality: Right;  Diabetic    Allergies: Biaxin [clarithromycin], Brompheniramine maleate, Ceclor [cefaclor], Ciprofloxacin, Clinoril [sulindac], Codeine sulfate, Diovan [valsartan], Doxycycline, Erythromycin, Esomeprazole, Fluoxetine, Guaifenesin & derivatives, Levaquin [levofloxacin in d5w], Lisinopril, Lodine [etodolac], Macrodantin [nitrofurantoin macrocrystal], Medrol  [methylprednisolone ], Mobic [meloxicam], Motrin [ibuprofen], Nexium [esomeprazole magnesium ], Nitrofurantoin, Norvasc [amlodipine besylate], Omnicef [cefdinir], Penicillins, Prednisone , Prozac [fluoxetine hcl], Pseudoephedrine, Reglan [metoclopramide], Seldane [terfenadine], Sulfa antibiotics, Toprol xl [metoprolol tartrate], Triamterene, Tussionex pennkinetic er [hydrocod poli-chlorphe poli er], Vibramycin [doxycycline calcium], and Amlodipine  Medications: Prior to Admission medications   Medication Sig Start Date End Date Taking? Authorizing Provider  Ascorbic Acid (VITAMIN C) 1000 MG tablet Take 1,000 mg by mouth 2 (two) times daily.    [provider]  cephALEXin  (KEFLEX ) 500 MG capsule Take 1 capsule (500 mg total) by mouth 4 (four) times daily. Patient not taking: Reported on 01/05/2024 12/19/23   Sherwood Drivers, PA-C  cholecalciferol  (VITAMIN D3) 25 MCG (1000 UT) tablet Take 1,000 Units by mouth 2 (two) times daily.    [provider]   cyanocobalamin  (VITAMIN B12) 1000 MCG tablet Take 1,000 mcg by mouth daily.    [provider]  fluticasone (FLONASE) 50 MCG/ACT nasal spray Place 2 sprays into both nostrils daily as needed for allergies.    [provider]  gemfibrozil  (LOPID ) 600 MG tablet Take 600 mg by mouth 2 (two) times daily before a meal.    [provider]  glipiZIDE (GLUCOTROL XL) 5 MG 24 hr tablet Take 10 mg by mouth in the morning and at bedtime.    [provider]  hydrochlorothiazide  (HYDRODIURIL ) 25 MG tablet Take 25 mg by mouth daily.    [provider]  iron  polysaccharides (NIFEREX) 150 MG capsule Take 150 mg by mouth daily.    [provider]  losartan (COZAAR) 25 MG tablet Take 25 mg by mouth daily. 11/30/23 11/29/24  [provider]  metFORMIN (GLUCOPHAGE-XR) 500 MG 24 hr tablet Take 1,000 mg by mouth 2 (two) times daily. 01/03/24   [provider]  Multiple Vitamins-Minerals (HAIR SKIN & NAILS PO) Take by mouth daily.    [provider]  Multiple Vitamins-Minerals (MULTIVITAMIN WITH MINERALS) tablet Take 1 tablet by mouth daily.    [provider]  Omega 3 1000 MG CAPS Take 1,000 mg by mouth daily.    [provider]  pantoprazole  (PROTONIX ) 40 MG tablet Take 40 mg by mouth daily.    [provider]  Probiotic Product (PROBIOTIC PO) Take by mouth daily.    [provider]  vitamin E 180 MG (400 UNITS) capsule Take 400 Units by mouth daily.    [provider]     Family History  Problem Relation Age of Onset   Hypertension Mother    Ovarian cancer Paternal Grandmother    COPD Father    Hyperlipidemia Father    Kidney disease Father    Diabetes Maternal Grandmother    Breast cancer Neg Hx     Social History   Socioeconomic History   Marital status: Widowed    Spouse name: Not on file   Number of children: Not on file   Years of education: Not on file   Highest education  level: Not on file  Occupational History   Not on file  Tobacco Use   Smoking status: Former    Current packs/day: 0.00    Types: Cigarettes    Quit date: 35    Years since quitting: 65.8   Smokeless tobacco: Never  Vaping Use   Vaping status: Never Used  Substance and Sexual Activity   Alcohol use: No   Drug use: No   Sexual activity: Not Currently  Other Topics Concern   Not on file  Social History Narrative   Not on file   Social Drivers of Health   Financial Resource Strain: Low Risk  (11/23/2023)   Received from [facility]   Overall Financial Resource Strain (CARDIA)    Difficulty of Paying Living Expenses: Not hard at all  Food Insecurity: No Food Insecurity (11/23/2023)   [facility]   Hunger Vital Sign    Within the past 12 months, you worried that your food would run out before you got the money to buy more.: Never true    Within the past 12 months, the food you bought just didn't last and you didn't have money to get more.: Never true  Transportation Needs: No Transportation Needs (11/23/2023)   Received from [facility] - Transportation    In the past 12 months, has lack of transportation kept you from medical appointments or from getting medications?: No    Lack of Transportation (Non-Medical): No  Physical Activity: Not on file  Stress: Not on file  Social Connections: Not on file    Review of Systems: A 12 point ROS discussed and pertinent positives are indicated in the HPI above.  All other systems are negative.  Review of Systems  Constitutional:  Positive for fatigue.  Gastrointestinal:  Positive for abdominal pain, diarrhea and nausea.       Back pain radiates around the body to the abdomen  Musculoskeletal:  Positive for back pain.  Neurological:  Positive for weakness.    Vital Signs: BP (!) 216/92 (BP Location: Left Arm, Patient Position: Sitting, Cuff Size: Normal)    Pulse 87   Temp 97.8 F (36.6 C) (Oral)   Resp 16   SpO2 99%   Physical Exam Constitutional:      General: She is not in acute distress.    Appearance: She is not ill-appearing.  Pulmonary:     Effort: Pulmonary effort is normal.  Musculoskeletal:        General: Tenderness and signs of injury present.       Arms:     Comments: Moderate tenderness to palpation in the mid to low back area.   Skin:    General: Skin is warm and dry.  Neurological:     Mental Status:  She is alert and oriented to person, place, and time.      Labs:  CBC: Recent Labs    05/08/23 1407 09/06/23 1404 01/05/24 1514  WBC 4.1 4.6 4.3  HGB 9.9* 10.0* 9.4*  HCT 29.7* 31.0* 28.3*  PLT 177 124* 149*    COAGS: No results for input(s): INR, APTT in the last 8760 hours.  BMP: Recent Labs    05/08/23 1408 09/06/23 1404 01/05/24 1514  NA 140 138 138  K 4.3 4.3 4.4  CL 103 106 106  CO2 25 23 21*  GLUCOSE 330* 182* 194*  BUN 31* 33* 41*  CALCIUM 9.2 8.8* 9.3  CREATININE 1.00 1.04* 1.19*  GFRNONAA 55* 52* 45*    LIVER FUNCTION TESTS: No results for input(s): BILITOT, AST, ALT, ALKPHOS, PROT, ALBUMIN in the last 8760 hours.  TUMOR MARKERS: No results for input(s): AFPTM, CEA, CA199, CHROMGRNA in the last 8760 hours.  Assessment and Plan:  89 year old female with a history of compression fractures presents today with a new T11 compression fracture.    Patient has suffered acute osteoporotic fracture of the T11 vertebra.   History and exam have demonstrated the following:   Acute/Subacute fracture by imaging dated 03/07/24, Pain on exam concordant with level of fracture, Failure of conservative therapy and pain refractory to narcotic pain mediation, Inability to tolerate narcotic pain medication due to side effects, and Significant disability on the [employer] Disability Questionnaire with 19/24 positive symptoms, reflecting significant impact/impairment of  (ADLs)   ICD-10-CM Codes that Support Medical Necessity (WelshBlog.at.aspx?articleId=[email or web address])  S22.080A    Wedge compression fracture of T11-T12 vertebra, initial encounter for closed fracture    Plan:  T11 vertebral body augmentation with balloon kyphoplasty  Post-procedure disposition: outpatient - [facility] Medication holds: none  The patient has suffered a fracture of the T11 vertebral body. It is recommended that patients aged 64 years or older be evaluated for possible testing or treatment of osteoporosis. A copy of this consult report is sent to the patient's referring physician.  Advanced Care Plan: The patient has an Advanced Care Plan or Surrogate Decision Maker documented in the EMR    Thank you for this interesting consult.  I greatly enjoyed meeting ALLEY NEILS and look forward to participating in their care.  A copy of this report was sent to the requesting provider on this date.  Electronically Signed: Warren Dais, AGACNP-BC 03/14/2024, 8:50 AM   I spent a total of  30 Minutes   in face to face in clinical consultation, greater than 50% of which was counseling/coordinating care for T11 compression fracture

## 2024-03-14 ENCOUNTER — Ambulatory Visit
Admission: RE | Admit: 2024-03-14 | Discharge: 2024-03-14 | Disposition: A | Discharge status: Home / Self Care | Source: Ambulatory Visit | Attending: Physical Medicine and Rehabilitation | Admitting: Physical Medicine and Rehabilitation

## 2024-03-14 DIAGNOSIS — S22000A Wedge compression fracture of unspecified thoracic vertebra, initial encounter for closed fracture: Secondary | ICD-10-CM

## 2024-03-14 HISTORY — PX: IR RADIOLOGIST EVAL & MGMT: IMG5224

## 2024-03-15 ENCOUNTER — Other Ambulatory Visit: Payer: Self-pay | Admitting: Interventional Radiology

## 2024-03-15 DIAGNOSIS — M8008XA Age-related osteoporosis with current pathological fracture, vertebra(e), initial encounter for fracture: Secondary | ICD-10-CM

## 2024-03-18 ENCOUNTER — Telehealth: Payer: Self-pay

## 2024-03-18 NOTE — Discharge Instructions (Signed)

## 2024-03-19 ENCOUNTER — Ambulatory Visit
Admission: RE | Admit: 2024-03-19 | Discharge: 2024-03-19 | Disposition: A | Discharge status: Home / Self Care | Source: Ambulatory Visit | Attending: Interventional Radiology | Admitting: Interventional Radiology

## 2024-03-19 DIAGNOSIS — M8008XA Age-related osteoporosis with current pathological fracture, vertebra(e), initial encounter for fracture: Secondary | ICD-10-CM

## 2024-03-19 HISTORY — PX: IR KYPHO THORACIC WITH BONE BIOPSY: IMG5518

## 2024-03-19 MED ORDER — MIDAZOLAM HCL 2 MG/2ML IJ SOLN: 1.0000 mg | INTRAMUSCULAR | Status: DC | PRN

## 2024-03-19 MED ORDER — MIDAZOLAM HCL 2 MG/2ML IJ SOLN
INTRAMUSCULAR | Status: AC | PRN
  Administered 2024-03-19 (×3): 1 mg via INTRAVENOUS

## 2024-03-19 MED ORDER — FENTANYL CITRATE (PF) 100 MCG/2ML IJ SOLN
INTRAMUSCULAR | Status: AC | PRN
  Administered 2024-03-19 (×3): 50 ug via INTRAVENOUS

## 2024-03-19 MED ORDER — LIDOCAINE HCL (PF) 1 % IJ SOLN
10.0000 mL | Freq: Once | INTRAMUSCULAR | Status: AC
  Administered 2024-03-19: 10 mL via INTRADERMAL

## 2024-03-19 MED ORDER — SODIUM CHLORIDE 0.9 % IV SOLN: INTRAVENOUS | Status: DC

## 2024-03-19 MED ORDER — FENTANYL CITRATE (PF) 50 MCG/ML IJ SOSY: 25.0000 ug | PREFILLED_SYRINGE | INTRAMUSCULAR | Status: DC | PRN

## 2024-03-19 MED ORDER — ACETAMINOPHEN 10 MG/ML IV SOLN
1000.0000 mg | Freq: Once | INTRAVENOUS | Status: AC
  Administered 2024-03-19: 1000 mg via INTRAVENOUS

## 2024-03-19 MED ORDER — ONDANSETRON HCL 4 MG/2ML IJ SOLN
8.0000 mg | Freq: Once | INTRAMUSCULAR | Status: AC
  Administered 2024-03-19: 8 mg via INTRAVENOUS

## 2024-03-19 MED ORDER — VANCOMYCIN HCL IN DEXTROSE 1-5 GM/200ML-% IV SOLN
1000.0000 mg | INTRAVENOUS | Status: AC
Start: 2024-03-19 — End: 2024-03-19
  Administered 2024-03-19: 1000 mg via INTRAVENOUS

## 2024-03-20 ENCOUNTER — Telehealth: Payer: Self-pay

## 2024-03-25 ENCOUNTER — Telehealth: Payer: Self-pay

## 2024-04-02 ENCOUNTER — Other Ambulatory Visit

## 2024-04-02 ENCOUNTER — Other Ambulatory Visit: Payer: Self-pay | Admitting: Physical Medicine and Rehabilitation

## 2024-04-02 DIAGNOSIS — M4854XA Collapsed vertebra, not elsewhere classified, thoracic region, initial encounter for fracture: Secondary | ICD-10-CM

## 2024-04-04 ENCOUNTER — Ambulatory Visit
Admission: RE | Admit: 2024-04-04 | Discharge: 2024-04-04 | Disposition: A | Discharge status: Home / Self Care | Source: Ambulatory Visit | Attending: Physical Medicine and Rehabilitation | Admitting: Physical Medicine and Rehabilitation

## 2024-04-04 DIAGNOSIS — M4854XA Collapsed vertebra, not elsewhere classified, thoracic region, initial encounter for fracture: Secondary | ICD-10-CM | POA: Insufficient documentation

## 2024-04-08 ENCOUNTER — Inpatient Hospital Stay: Admitting: Nurse Practitioner

## 2024-04-08 ENCOUNTER — Inpatient Hospital Stay

## 2024-04-08 ENCOUNTER — Other Ambulatory Visit: Payer: Self-pay | Admitting: Family Medicine

## 2024-04-08 ENCOUNTER — Other Ambulatory Visit

## 2024-04-08 ENCOUNTER — Ambulatory Visit

## 2024-04-08 ENCOUNTER — Ambulatory Visit: Admitting: Internal Medicine

## 2024-04-08 DIAGNOSIS — M4854XA Collapsed vertebra, not elsewhere classified, thoracic region, initial encounter for fracture: Secondary | ICD-10-CM

## 2024-04-11 ENCOUNTER — Ambulatory Visit
Admission: RE | Admit: 2024-04-11 | Discharge: 2024-04-11 | Disposition: A | Discharge status: Home / Self Care | Source: Ambulatory Visit | Attending: Family Medicine | Admitting: Family Medicine

## 2024-04-11 ENCOUNTER — Other Ambulatory Visit: Payer: Self-pay | Admitting: Internal Medicine

## 2024-04-11 DIAGNOSIS — M4854XA Collapsed vertebra, not elsewhere classified, thoracic region, initial encounter for fracture: Secondary | ICD-10-CM

## 2024-04-11 DIAGNOSIS — R634 Abnormal weight loss: Secondary | ICD-10-CM

## 2024-04-11 DIAGNOSIS — M549 Dorsalgia, unspecified: Secondary | ICD-10-CM

## 2024-04-11 DIAGNOSIS — R140 Abdominal distension (gaseous): Secondary | ICD-10-CM

## 2024-04-11 HISTORY — PX: IR RADIOLOGIST EVAL & MGMT: IMG5224

## 2024-04-11 NOTE — Progress Notes (Signed)
 Chief Complaint: Patient was seen in consultation today for osteoporotic compression fracture of T10 at the request of Meeler,Whitney L  Referring Physician(s): Meeler,Whitney L  History of Present Illness: Kim Ballard is a 89 y.o. female With a history of osteoporosis and multiple prior osteoporotic compression fractures including L5 and T11.  Patient underwent kyphoplasty for the L5 lesion on 12/12/2023 and then underwent kyphoplasty for the T11 lesion on 03/19/2024.  Unfortunately, she has continued to experience thoracic spine pain extending along both ribs around her flanks to the abdomen.  Therefore we ordered a follow-up MRI which was completed on 04/04/2024 and demonstrates a new acute fracture of T10 just above the prior augmentation.  The amount of edema in the vertebral body is significant consistent with her clinical symptoms.  She remains in severe pain which she rates a 9 out of 10 on a 10 point scale.  Further, she remains fairly debilitated scoring 20 out of 24 on the [employer] disability questionnaire.  She is quite distressed as prior to the beginning of these episodes she was extremely active and a real go-getter.  But the multiple compression fractures and the severe pneumonia have really set her back.  She is tearful and frustrated.  MRI 04/04/24  1. Mild compression fracture of T10 with approximately 15% anterior height loss, upward bowing of the inferior endplate, and bone marrow edema within the inferior vertebral body, new since 03/07/24.  Past Medical History:  Diagnosis Date   Allergic state    Anemia    Arthritis    Cancer (HCC)    skin cancers   Chronic cystitis with hematuria    Complication of anesthesia 10/28/2022   Excessive night sweats and itching after procedure 10/28/22   Depression    Diabetes mellitus without complication (HCC)    Fibrocystic disease of both breasts    Fibromyalgia    GERD (gastroesophageal reflux disease)     Hypertension    IBS (irritable bowel syndrome)    Melanoma (HCC) 07/09/2021   lt neck/shoulder area   Melanoma of neck (HCC) 09/06/2021   left   Nausea    Skin cancer    Wears dentures    full upper and lower    Past Surgical History:  Procedure Laterality Date   ABDOMINAL HYSTERECTOMY     APPENDECTOMY     BREAST BIOPSY Right 2001   surgical bx    CATARACT EXTRACTION W/PHACO Right 03/08/2017   Procedure: CATARACT EXTRACTION PHACO AND INTRAOCULAR LENS PLACEMENT (IOC) RIGHT DIABETIC TORIC;  Surgeon: Mittie Gaskin, MD;  Location: [facility];  Service: Ophthalmology;  Laterality: Right;  Diabetic - oral meds   CATARACT EXTRACTION W/PHACO Left 04/17/2017   Procedure: CATARACT EXTRACTION PHACO AND INTRAOCULAR LENS PLACEMENT (IOC) LEFT DIABETIC;  Surgeon: Mittie Gaskin, MD;  Location: [facility];  Service: Ophthalmology;  Laterality: Left;  diabetic-oral meds   CHOLECYSTECTOMY     COLONOSCOPY     COLONOSCOPY N/A 10/05/2022   Procedure: COLONOSCOPY;  Surgeon: Toledo, Ladell POUR, MD;  Location: [facility] ENDOSCOPY;  Service: Gastroenterology;  Laterality: N/A;   COLONOSCOPY WITH PROPOFOL  N/A 08/27/2015   Procedure: COLONOSCOPY WITH PROPOFOL ;  Surgeon: Lamar ONEIDA Holmes, MD;  Location: [facility] END[facility]COPY;  Service: Endoscopy;  Laterality: N/A;   COLONOSCOPY WITH PROPOFOL  N/A 05/02/2016   Procedure: COLONOSCOPY WITH PROPOFOL ;  Surgeon: Lamar ONEIDA Holmes, MD;  Location: [facility];  Service: Endoscopy;  Laterality: N/A;   ESOPHAGOGASTRODUODENOSCOPY     ESOPHAGOGASTRODUODENOSCOPY N/A 10/05/2022  Procedure: ESOPHAGOGASTRODUODENOSCOPY (EGD);  Surgeon: Toledo, Ladell POUR, MD;  Location: [facility] ENDOSCOPY;  Service: Gastroenterology;  Laterality: N/A;   ESOPHAGOGASTRODUODENOSCOPY (EGD) WITH PROPOFOL  N/A 08/27/2015   Procedure: ESOPHAGOGASTRODUODENOSCOPY (EGD) WITH PROPOFOL ;  Surgeon: Lamar ONEIDA Holmes, MD;  Location: [facility] ENDOSCOPY;  Service: Endoscopy;  Laterality: N/A;   EYE  SURGERY     HERNIA REPAIR     IR BONE MARROW BIOPSY & ASPIRATION  10/28/2022   IR KYPHO LUMBAR INC FX REDUCE BONE BX UNI/BIL CANNULATION INC/IMAGING  12/12/2023   IR KYPHO THORACIC WITH BONE BIOPSY  03/19/2024   IR RADIOLOGIST EVAL & MGMT  12/05/2023   IR RADIOLOGIST EVAL & MGMT  12/14/2023   IR RADIOLOGIST EVAL & MGMT  01/02/2024   IR RADIOLOGIST EVAL & MGMT  03/14/2024   IR RADIOLOGIST EVAL & MGMT  04/11/2024   MICROLARYNGOSCOPY Right 11/11/2022   Procedure: MICROLARYNGOSCOPY WITH BIOPSY OF PYRIFORM SINUS MASS;  Surgeon: Herminio Miu, MD;  Location: [facility];  Service: ENT;  Laterality: Right;  Diabetic    Allergies: Biaxin [clarithromycin], Brompheniramine maleate, Ceclor [cefaclor], Ciprofloxacin, Clinoril [sulindac], Codeine sulfate, Diovan [valsartan], Doxycycline, Erythromycin, Esomeprazole, Fluoxetine, Guaifenesin & derivatives, Levaquin [levofloxacin in d5w], Lisinopril, Lodine [etodolac], Macrodantin [nitrofurantoin macrocrystal], Medrol  [methylprednisolone ], Mobic [meloxicam], Motrin [ibuprofen], Nexium [esomeprazole magnesium ], Nitrofurantoin, Norvasc [amlodipine besylate], Omnicef [cefdinir], Penicillins, Prednisone , Prozac [fluoxetine hcl], Pseudoephedrine, Reglan [metoclopramide], Seldane [terfenadine], Sulfa antibiotics, Toprol xl [metoprolol tartrate], Triamterene, Tussionex pennkinetic er [hydrocod poli-chlorphe poli er], Vibramycin [doxycycline calcium], and Amlodipine  Medications: Prior to Admission medications   Medication Sig Start Date End Date Taking? Authorizing Provider  Ascorbic Acid (VITAMIN C) 1000 MG tablet Take 1,000 mg by mouth 2 (two) times daily.    [provider]  cephALEXin  (KEFLEX ) 500 MG capsule Take 1 capsule (500 mg total) by mouth 4 (four) times daily. Patient not taking: Reported on 01/05/2024 12/19/23   Sherwood Drivers, PA-C  cholecalciferol  (VITAMIN D3) 25 MCG (1000 UT) tablet Take 1,000 Units by mouth 2 (two) times daily.    [provider]  cyanocobalamin  (VITAMIN B12) 1000 MCG tablet Take 1,000 mcg by mouth daily.    [provider]  fluticasone (FLONASE) 50 MCG/ACT nasal spray Place 2 sprays into both nostrils daily as needed for allergies.    [provider]  gemfibrozil  (LOPID ) 600 MG tablet Take 600 mg by mouth 2 (two) times daily before a meal.    [provider]  glipiZIDE (GLUCOTROL XL) 5 MG 24 hr tablet Take 10 mg by mouth in the morning and at bedtime.    [provider]  hydrochlorothiazide  (HYDRODIURIL ) 25 MG tablet Take 25 mg by mouth daily.    [provider]  iron  polysaccharides (NIFEREX) 150 MG capsule Take 150 mg by mouth daily.    [provider]  losartan (COZAAR) 25 MG tablet Take 25 mg by mouth daily. 11/30/23 11/29/24  [provider]  metFORMIN (GLUCOPHAGE-XR) 500 MG 24 hr tablet Take 1,000 mg by mouth 2 (two) times daily. 01/03/24   [provider]  Multiple Vitamins-Minerals (HAIR SKIN & NAILS PO) Take by mouth daily.    [provider]  Multiple Vitamins-Minerals (MULTIVITAMIN WITH MINERALS) tablet Take 1 tablet by mouth daily.    [provider]  Omega 3 1000 MG CAPS Take 1,000 mg by mouth daily.    [provider]  pantoprazole  (PROTONIX ) 40 MG tablet Take 40 mg by mouth daily.    [provider]  Probiotic Product (PROBIOTIC PO) Take by  mouth daily.    [provider]  vitamin E 180 MG (400 UNITS) capsule Take 400 Units by mouth daily.    [provider]     Family History  Problem Relation Age of Onset   Hypertension Mother    Ovarian cancer Paternal Grandmother    COPD Father    Hyperlipidemia Father    Kidney disease Father    Diabetes Maternal Grandmother    Breast cancer Neg Hx     Social History   Socioeconomic History   Marital status: Widowed    Spouse name: Not on file   Number of children: Not on file   Years of education: Not on file    Highest education level: Not on file  Occupational History   Not on file  Tobacco Use   Smoking status: Former    Current packs/day: 0.00    Types: Cigarettes    Quit date: 1    Years since quitting: 65.8   Smokeless tobacco: Never  Vaping Use   Vaping status: Never Used  Substance and Sexual Activity   Alcohol use: No   Drug use: No   Sexual activity: Not Currently  Other Topics Concern   Not on file  Social History Narrative   Not on file   Social Drivers of Health   Financial Resource Strain: Low Risk  (11/23/2023)   Received from [facility]   Overall Financial Resource Strain (CARDIA)    Difficulty of Paying Living Expenses: Not hard at all  Food Insecurity: No Food Insecurity (11/23/2023)   Received from [facility]   Hunger Vital Sign    Within the past 12 months, you worried that your food would run out before you got the money to buy more.: Never true    Within the past 12 months, the food you bought just didn't last and you didn't have money to get more.: Never true  Transportation Needs: No Transportation Needs (11/23/2023)   Received from [facility] - Transportation    In the past 12 months, has lack of transportation kept you from medical appointments or from getting medications?: No    Lack of Transportation (Non-Medical): No  Physical Activity: Not on file  Stress: Not on file  Social Connections: Not on file    Review of Systems: A 12 point ROS discussed and pertinent positives are indicated in the HPI above.  All other systems are negative.  Review of Systems  Vital Signs: BP (!) 199/82 (BP Location: Right Arm, Patient Position: Sitting, Cuff Size: Normal)   Pulse 90   Temp 98 F (36.7 C) (Oral)   Resp 16   Wt 62.1 kg   SpO2 98%   BMI 21.46 kg/m   Advance Care Plan: The advanced care plan/surrogate decision maker was discussed at the time of visit and the patient did not wish to  discuss or was not able to name a surrogate decision maker or provide an advance care plan.    Physical Exam Constitutional:      General: She is not in acute distress.    Appearance: Normal appearance.  HENT:     Head: Normocephalic and atraumatic.  Eyes:     General: No scleral icterus. Cardiovascular:     Rate and Rhythm: Normal rate.  Pulmonary:     Effort: Pulmonary effort is normal.  Abdominal:     General: Abdomen is flat. There is no distension.  Tenderness: There is no abdominal tenderness. There is no guarding.  Musculoskeletal:       Back:     Comments: TTP at the T10 spinous process with TTP in the left greater than right paraspinal muscles as well  Skin:    General: Skin is warm and dry.  Neurological:     Mental Status: She is alert and oriented to person, place, and time.  Psychiatric:        Mood and Affect: Mood normal.        Behavior: Behavior normal.       Imaging: IR Radiologist Eval & Mgmt Result Date: 04/11/2024 EXAM: ESTABLISHED PATIENT OFFICE VISIT CHIEF COMPLAINT: SEE NOTE IN EPIC HISTORY OF PRESENT ILLNESS: SEE NOTE IN EPIC REVIEW OF SYSTEMS: SEE NOTE IN EPIC PHYSICAL EXAMINATION: SEE NOTE IN EPIC ASSESSMENT AND PLAN: SEE NOTE IN EPIC Electronically Signed   By: Wilkie Lent M.D.   On: 04/11/2024 08:53   MR THORACIC SPINE WO CONTRAST Result Date: 04/04/2024 EXAM: MRI THORACIC SPINE WITHOUT INTRAVENOUS CONTRAST 04/04/2024 11:31:00 AM TECHNIQUE: Multiplanar multisequence MRI of the thoracic spine was performed without the administration of intravenous contrast. COMPARISON: MRI of the thoracic spine dated 03/07/2024. CLINICAL HISTORY: FINDINGS: BONES AND ALIGNMENT: Normal alignment. T10: Mild impression fracture with approximately 15% height loss anteriorly. Upward bowing of the inferior endplate. Bone marrow edema within the inferior vertebral body. T11: Status post interval bilateral vertebral augmentation. Bone marrow signal is otherwise  unremarkable. No abnormal enhancement. SPINAL CORD: Normal spinal cord volume. Normal spinal cord signal. SOFT TISSUES: Unremarkable. DEGENERATIVE CHANGES: T3: Left-sided perineural cyst within the left neural foramen. T5: Right-sided perineural cyst within the right neural foramen. No significant disc herniation. No spinal canal stenosis. IMPRESSION: 1. Mild compression fracture of T10 with approximately 15% anterior height loss, upward bowing of the inferior endplate, and bone marrow edema within the inferior vertebral body, new since 03/07/24. 2. Status post interval bilateral vertebral augmentation at T11. 3. 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 4. Thoracic spinal cord normal in morphology and signal. Electronically signed by: Evalene Coho MD 04/04/2024 11:43 AM EDT RP Workstation: [id number]   IR KYPHO THORACIC WITH BONE BIOPSY Result Date: 03/19/2024 CLINICAL DATA:  89 year old female with a highly symptomatic osteoporotic compression fracture of the T11 vertebral body. She has failed conservative management and therefore presents for cement augmentation. EXAM: FLUOROSCOPIC GUIDED KYPHOPLASTY OF THE T11 VERTEBRAL BODY COMPARISON:  MRI thoracic spine 03/07/2024 MEDICATIONS: As antibiotic prophylaxis, 1 g vancomycin , 8 mg Zofran  and 1 g Tylenol  were ordered pre-procedure and administered intravenously by the Radiology nurse within 1 hour of incision. ANESTHESIA/SEDATION: Moderate (conscious) sedation was employed during this procedure. A total of Versed  3 mg and Fentanyl  150 mcg was administered intravenously by the Radiology nurse. Moderate Sedation Time: 29 minutes. The patient's level of consciousness and vital signs were monitored continuously by radiology nursing throughout the procedure under my direct supervision. FLUOROSCOPY TIME:  Radiation exposure index: 36.7 mGy, air kerma COMPLICATIONS: None immediate. PROCEDURE: The procedure, risks (including but not limited to bleeding,  infection, organ damage), benefits, and alternatives were explained to the patient. Questions regarding the procedure were encouraged and answered. The patient understands and consents to the procedure. The patient was placed prone on the fluoroscopic table. The skin overlying the upper thoracic region was then prepped and draped in the usual sterile fashion. Maximal barrier sterile technique was utilized including caps, mask, sterile gowns, sterile gloves, sterile drape, hand hygiene and skin antiseptic.  Intravenous Fentanyl  and Versed  were administered as conscious sedation during continuous cardiorespiratory monitoring by the radiology RN. The left pedicle at T11 was then infiltrated with 1% lidocaine  followed by the advancement of a Kyphon trocar needle through the left pedicle into the posterior one-third of the vertebral body. Subsequently, the osteo drill was advanced to the anterior third of the vertebral body. The osteo drill was retracted. Through the working cannula, a Kyphon inflatable bone tamp 15 x 2.5 was advanced and positioned with the distal marker approximately 5 mm from the anterior aspect of the cortex. Appropriate positioning was confirmed on the AP projection. At this time, the balloon was expanded using contrast via a Kyphon inflation syringe device via micro tubing. Inflations were continued until there was near apposition with the superior end plate. At this time, methylmethacrylate mixture was reconstituted in the Kyphon bone mixing device system. This was then loaded into the delivery mechanism, attached to Kyphon bone fillers. The balloons were deflated and removed followed by the instillation of methylmethacrylate mixture with excellent filling in the AP and lateral projections. No extravasation was noted in the disk spaces or posteriorly into the spinal canal. No epidural venous contamination was seen. The working cannulae and the bone filler were then retrieved and removed. Hemostasis  was achieved with manual compression. The patient tolerated the procedure well without immediate postprocedural complication. IMPRESSION: 1. Technically successful T11 vertebral body augmentation using balloon kyphoplasty. 2. Per CMS PQRS reporting requirements (PQRS Measure 24): Given the patient's age of greater than 50 and the fracture site (hip, distal radius, or spine), the patient should be tested for osteoporosis using DXA, and the appropriate treatment considered based on the DXA results. Electronically Signed   By: Wilkie Lent M.D.   On: 03/19/2024 11:23   IR Radiologist Eval & Mgmt Result Date: 03/14/2024 EXAM: ESTABLISHED PATIENT OFFICE VISIT CHIEF COMPLAINT: SEE NOTE IN EPIC HISTORY OF PRESENT ILLNESS: SEE NOTE IN EPIC REVIEW OF SYSTEMS: SEE NOTE IN EPIC PHYSICAL EXAMINATION: SEE NOTE IN EPIC ASSESSMENT AND PLAN: SEE NOTE IN EPIC Electronically Signed   By: Wilkie Lent M.D.   On: 03/14/2024 09:45    Labs:  CBC: Recent Labs    05/08/23 1407 09/06/23 1404 01/05/24 1514  WBC 4.1 4.6 4.3  HGB 9.9* 10.0* 9.4*  HCT 29.7* 31.0* 28.3*  PLT 177 124* 149*    COAGS: No results for input(s): INR, APTT in the last 8760 hours.  BMP: Recent Labs    05/08/23 1408 09/06/23 1404 01/05/24 1514  NA 140 138 138  K 4.3 4.3 4.4  CL 103 106 106  CO2 25 23 21*  GLUCOSE 330* 182* 194*  BUN 31* 33* 41*  CALCIUM 9.2 8.8* 9.3  CREATININE 1.00 1.04* 1.19*  GFRNONAA 55* 52* 45*    LIVER FUNCTION TESTS: No results for input(s): BILITOT, AST, ALT, ALKPHOS, PROT, ALBUMIN in the last 8760 hours.  TUMOR MARKERS: No results for input(s): AFPTM, CEA, CA199, CHROMGRNA in the last 8760 hours.  Assessment and Plan:  Unfortunate 89 year old female with osteoporosis and yet another highly symptomatic osteoporotic compression fracture, this time of the T10 vertebral body.  She has again failed conservative management and remains in severe 9 out of 10 pain with  significant disability (20 out of 24 on the [employer] disability questionnaire).  She is quite distressed that she has suffered sequential compression fractures over these last 4 months.  She is desperate for relief and desires to undergo further cement augmentation  with balloon kyphoplasty at the T10 level.  She will also require referral to endocrinology as she very likely needs to pursue metabolic therapy for her osteoporosis to prevent future fractures.  1.)  Please schedule for T10 cement augmentation with balloon kyphoplasty ASAP, next Tuesday if possible.  2.)  Please refer to Dr. Damian with endocrinology at [facility] clinic to evaluate and treat for osteoporosis with multiple compression fractures.   Electronically Signed: Wilkie MARLA Lent 04/11/2024, 10:32 AM   I spent a total of   25 Minutes in face to face in clinical consultation, greater than 50% of which was counseling/coordinating care for Osteoporotic compression fracture of the T10 vertebral body.

## 2024-04-12 ENCOUNTER — Other Ambulatory Visit: Payer: Self-pay | Admitting: Interventional Radiology

## 2024-04-12 DIAGNOSIS — M8008XA Age-related osteoporosis with current pathological fracture, vertebra(e), initial encounter for fracture: Secondary | ICD-10-CM

## 2024-04-15 ENCOUNTER — Telehealth: Payer: Self-pay | Admitting: Internal Medicine

## 2024-04-15 ENCOUNTER — Inpatient Hospital Stay

## 2024-04-15 ENCOUNTER — Telehealth: Payer: Self-pay

## 2024-04-15 ENCOUNTER — Inpatient Hospital Stay: Attending: Internal Medicine

## 2024-04-15 ENCOUNTER — Inpatient Hospital Stay: Admitting: Nurse Practitioner

## 2024-04-15 NOTE — Telephone Encounter (Signed)
 pt canceled appts for today and stated she has to have surgery on her back and she will call to r/s appts

## 2024-04-15 NOTE — Discharge Instructions (Signed)

## 2024-04-16 ENCOUNTER — Ambulatory Visit
Admission: RE | Admit: 2024-04-16 | Discharge: 2024-04-16 | Disposition: A | Discharge status: Home / Self Care | Source: Ambulatory Visit | Attending: Interventional Radiology | Admitting: Interventional Radiology

## 2024-04-16 DIAGNOSIS — M8008XA Age-related osteoporosis with current pathological fracture, vertebra(e), initial encounter for fracture: Secondary | ICD-10-CM

## 2024-04-16 HISTORY — PX: IR KYPHO THORACIC WITH BONE BIOPSY: IMG5518

## 2024-04-16 MED ORDER — FENTANYL CITRATE (PF) 50 MCG/ML IJ SOSY
25.0000 ug | PREFILLED_SYRINGE | INTRAMUSCULAR | Status: DC | PRN
  Administered 2024-04-16: 50 ug via INTRAVENOUS
  Administered 2024-04-16: 25 ug via INTRAVENOUS
  Administered 2024-04-16: 50 ug via INTRAVENOUS
  Administered 2024-04-16: 25 ug via INTRAVENOUS

## 2024-04-16 MED ORDER — VANCOMYCIN HCL IN DEXTROSE 1-5 GM/200ML-% IV SOLN
1000.0000 mg | INTRAVENOUS | Status: AC
  Administered 2024-04-16: 1000 mg via INTRAVENOUS

## 2024-04-16 MED ORDER — ACETAMINOPHEN 10 MG/ML IV SOLN
1000.0000 mg | Freq: Once | INTRAVENOUS | Status: AC
  Administered 2024-04-16: 1000 mg via INTRAVENOUS

## 2024-04-16 MED ORDER — MIDAZOLAM HCL (PF) 2 MG/2ML IJ SOLN
1.0000 mg | INTRAMUSCULAR | Status: DC | PRN
  Administered 2024-04-16 (×3): 1 mg via INTRAVENOUS

## 2024-04-16 MED ORDER — LIDOCAINE-EPINEPHRINE 1 %-1:100000 IJ SOLN
10.0000 mL | Freq: Once | INTRAMUSCULAR | Status: AC
  Administered 2024-04-16: 10 mL via INTRADERMAL

## 2024-04-16 MED ORDER — ONDANSETRON HCL 4 MG/2ML IJ SOLN
8.0000 mg | Freq: Once | INTRAMUSCULAR | Status: AC
  Administered 2024-04-16: 8 mg via INTRAVENOUS

## 2024-04-16 MED ORDER — SODIUM CHLORIDE 0.9 % IV SOLN: INTRAVENOUS | Status: DC

## 2024-04-17 ENCOUNTER — Ambulatory Visit
Admission: RE | Admit: 2024-04-17 | Discharge: 2024-04-17 | Disposition: A | Discharge status: Home / Self Care | Source: Ambulatory Visit | Attending: Internal Medicine | Admitting: Internal Medicine

## 2024-04-17 ENCOUNTER — Telehealth: Payer: Self-pay

## 2024-04-17 DIAGNOSIS — R634 Abnormal weight loss: Secondary | ICD-10-CM | POA: Diagnosis present

## 2024-04-17 DIAGNOSIS — M549 Dorsalgia, unspecified: Secondary | ICD-10-CM | POA: Insufficient documentation

## 2024-04-17 DIAGNOSIS — R140 Abdominal distension (gaseous): Secondary | ICD-10-CM | POA: Insufficient documentation

## 2024-04-17 MED ORDER — IOHEXOL 300 MG/ML  SOLN
100.0000 mL | Freq: Once | INTRAMUSCULAR | Status: AC | PRN
  Administered 2024-04-17: 80 mL via INTRAVENOUS

## 2024-04-23 ENCOUNTER — Other Ambulatory Visit: Payer: Self-pay | Admitting: Interventional Radiology

## 2024-04-23 ENCOUNTER — Telehealth: Payer: Self-pay

## 2024-04-23 DIAGNOSIS — M4854XG Collapsed vertebra, not elsewhere classified, thoracic region, subsequent encounter for fracture with delayed healing: Secondary | ICD-10-CM

## 2024-04-24 ENCOUNTER — Inpatient Hospital Stay: Attending: Internal Medicine

## 2024-04-24 ENCOUNTER — Inpatient Hospital Stay: Admitting: Nurse Practitioner

## 2024-04-24 ENCOUNTER — Inpatient Hospital Stay

## 2024-04-24 VITALS — BP 189/79 | HR 81 | Temp 97.0°F | Resp 18 | Ht 67.0 in | Wt 141.0 lb

## 2024-04-24 DIAGNOSIS — D693 Immune thrombocytopenic purpura: Secondary | ICD-10-CM | POA: Insufficient documentation

## 2024-04-24 DIAGNOSIS — M549 Dorsalgia, unspecified: Secondary | ICD-10-CM | POA: Insufficient documentation

## 2024-04-24 DIAGNOSIS — Z79899 Other long term (current) drug therapy: Secondary | ICD-10-CM | POA: Insufficient documentation

## 2024-04-24 DIAGNOSIS — N183 Chronic kidney disease, stage 3 unspecified: Secondary | ICD-10-CM | POA: Insufficient documentation

## 2024-04-24 DIAGNOSIS — I129 Hypertensive chronic kidney disease with stage 1 through stage 4 chronic kidney disease, or unspecified chronic kidney disease: Secondary | ICD-10-CM | POA: Insufficient documentation

## 2024-04-24 DIAGNOSIS — D509 Iron deficiency anemia, unspecified: Secondary | ICD-10-CM

## 2024-04-24 DIAGNOSIS — D696 Thrombocytopenia, unspecified: Secondary | ICD-10-CM | POA: Insufficient documentation

## 2024-04-24 DIAGNOSIS — Z87891 Personal history of nicotine dependence: Secondary | ICD-10-CM | POA: Insufficient documentation

## 2024-04-24 DIAGNOSIS — D649 Anemia, unspecified: Secondary | ICD-10-CM

## 2024-04-24 DIAGNOSIS — Z8041 Family history of malignant neoplasm of ovary: Secondary | ICD-10-CM | POA: Insufficient documentation

## 2024-04-24 LAB — BASIC METABOLIC PANEL - CANCER CENTER ONLY
Anion gap: 11 (ref 5–15)
BUN: 39 mg/dL — ABNORMAL HIGH (ref 8–23)
CO2: 22 mmol/L (ref 22–32)
Calcium: 10 mg/dL (ref 8.9–10.3)
Chloride: 103 mmol/L (ref 98–111)
Creatinine: 1.18 mg/dL — ABNORMAL HIGH (ref 0.44–1.00)
GFR, Estimated: 45 mL/min — ABNORMAL LOW (ref >60)
Glucose, Bld: 168 mg/dL — ABNORMAL HIGH (ref 70–99)
Potassium: 4.3 mmol/L (ref 3.5–5.1)
Sodium: 136 mmol/L (ref 135–145)

## 2024-04-24 LAB — CBC WITH DIFFERENTIAL (CANCER CENTER ONLY)
Abs Immature Granulocytes: 0.02 K/uL (ref 0.00–0.07)
Basophils Absolute: 0 K/uL (ref 0.0–0.1)
Basophils Relative: 1 %
Eosinophils Absolute: 0.1 K/uL (ref 0.0–0.5)
Eosinophils Relative: 3 %
HCT: 29.3 % — ABNORMAL LOW (ref 36.0–46.0)
Hemoglobin: 9.8 g/dL — ABNORMAL LOW (ref 12.0–15.0)
Immature Granulocytes: 0 %
Lymphocytes Relative: 16 %
Lymphs Abs: 0.9 K/uL (ref 0.7–4.0)
MCH: 29.9 pg (ref 26.0–34.0)
MCHC: 33.4 g/dL (ref 30.0–36.0)
MCV: 89.3 fL (ref 80.0–100.0)
Monocytes Absolute: 0.4 K/uL (ref 0.1–1.0)
Monocytes Relative: 8 %
Neutro Abs: 4 K/uL (ref 1.7–7.7)
Neutrophils Relative %: 72 %
Platelet Count: 163 K/uL (ref 150–400)
RBC: 3.28 MIL/uL — ABNORMAL LOW (ref 3.87–5.11)
RDW: 13.4 % (ref 11.5–15.5)
WBC Count: 5.5 K/uL (ref 4.0–10.5)
nRBC: 0 % (ref 0.0–0.2)

## 2024-04-24 LAB — IRON AND TIBC
Iron: 70 ug/dL (ref 28–170)
Saturation Ratios: 15 % (ref 10.4–31.8)
TIBC: 463 ug/dL — ABNORMAL HIGH (ref 250–450)
UIBC: 393 ug/dL

## 2024-04-24 LAB — FERRITIN: Ferritin: 46 ng/mL (ref 11–307)

## 2024-04-24 LAB — LACTATE DEHYDROGENASE: LDH: 149 U/L (ref 105–235)

## 2024-04-24 NOTE — Progress Notes (Unsigned)
 [facility] CONSULT NOTE  Patient Care Team: Sadie Manna, MD as PCP - General (Internal Medicine) Sadie Manna, MD as Physician Assistant (Internal Medicine) Rennie Cindy SAUNDERS, MD as Consulting Physician (Oncology)  CHIEF COMPLAINTS/PURPOSE OF CONSULTATION: Iron  deficiency anemia/thrombocytopenia  #Chronic [2015] mild anemia hemoglobin 11.1-question iron  deficiency versus others.-Dr. Elliott/Dr. Rudell; s/p Venofer . MAY 2024- BONE MARROW, ASPIRATE, CLOT, CORE:  - Normocellular bone marrow (20%) with trilineage hematopoiesis and no  increase in blasts.  Morphologic evaluation and immunohistochemical analysis do not provide a  precise explanation for the patient's anemia and thrombocytopenia. The  absence of significant morphologic dysplasia makes a myelodysplastic  syndrome less likely and secondary causes should be considered; however,  correlation with pending cytogenetic and molecular studies will be of  interest to exclude a morphologically subtle evolving myeloid neoplasm.   #Mild thrombocytopenia platelets 120s  #Fatigue/ PN/DM; MELANOMA of mid back [May 2021] s/p excision. MELANOMA LEFT neck posterior-s/p excision.[JAN 2023 ]  Oncology History   No history exists.    HISTORY OF PRESENTING ILLNESS: Alone.  Ambulating with cane.  Kim Ballard 89 y.o.  female longstanding history of iron  deficient anemia/thrombocytopenia- chronic fatigue is here for follow-up.   Patient states she's recovering from s/p kyphoplasty in July 2025-presents to clinic today walking with a cane.  She reports increased back pain this week.  She reports rolling over in bed and feeling her muscle pull.  Since then she has been having increased back pain and discomfort.  She is here at the clinic for repeat labs and possible iron  infusion versus Retacrit injection.  She does endorse ongoing fatigue.  Denies any headaches vision changes or dizziness.  However during the clinic  visit her blood pressure remained extremely elevated.  Her last blood pressure reading was at 186/84.  It appears she has a long history of poorly controlled blood pressure and back pain.  I called her primary care office at [facility] discussed with nurse.  They are working her in to be seen at [place] clinic tomorrow at 1230.  They are going to address back pain and ongoing poorly controlled blood pressure.  We are going to hold off on Venofer  today bring her back in on Friday and repeat blood pressure and proceed with possible Venofer  if blood pressure is under better control.  Patient in agreement with plan of care.  She denies any blood in urine sputum or stool.  Denies seeing any black tarry stools.  She does report that she continues to take daily iron  and tolerating well.   Review of Systems  Constitutional:  Positive for malaise/fatigue. Negative for chills, diaphoresis, fever and weight loss.  HENT:  Negative for nosebleeds and sore throat.   Eyes:  Negative for double vision.  Respiratory:  Negative for cough, hemoptysis, sputum production, shortness of breath and wheezing.   Cardiovascular:  Negative for chest pain, palpitations, orthopnea and leg swelling.  Gastrointestinal:  Negative for blood in stool, constipation, diarrhea, heartburn, melena, nausea and vomiting.  Musculoskeletal:  Positive for back pain and myalgias. Negative for joint pain.  Skin: Negative.  Negative for itching and rash.  Neurological:  Negative for dizziness, tingling, focal weakness, weakness and headaches.  Endo/Heme/Allergies:  Does not bruise/bleed easily.  Psychiatric/Behavioral:  Negative for depression. The patient is not nervous/anxious and does not have insomnia.      MEDICAL HISTORY:  Past Medical History:  Diagnosis Date   Allergic state    Anemia    Arthritis  Cancer (HCC)    skin cancers   Chronic cystitis with hematuria    Complication of anesthesia 10/28/2022   Excessive night  sweats and itching after procedure 10/28/22   Depression    Diabetes mellitus without complication (HCC)    Fibrocystic disease of both breasts    Fibromyalgia    GERD (gastroesophageal reflux disease)    Hypertension    IBS (irritable bowel syndrome)    Melanoma (HCC) 07/09/2021   lt neck/shoulder area   Melanoma of neck (HCC) 09/06/2021   left   Nausea    Skin cancer    Wears dentures    full upper and lower    SURGICAL HISTORY: Past Surgical History:  Procedure Laterality Date   ABDOMINAL HYSTERECTOMY     APPENDECTOMY     BREAST BIOPSY Right 2001   surgical bx    CATARACT EXTRACTION W/PHACO Right 03/08/2017   Procedure: CATARACT EXTRACTION PHACO AND INTRAOCULAR LENS PLACEMENT (IOC) RIGHT DIABETIC TORIC;  Surgeon: Mittie Gaskin, MD;  Location: [facility];  Service: Ophthalmology;  Laterality: Right;  Diabetic - oral meds   CATARACT EXTRACTION W/PHACO Left 04/17/2017   Procedure: CATARACT EXTRACTION PHACO AND INTRAOCULAR LENS PLACEMENT (IOC) LEFT DIABETIC;  Surgeon: Mittie Gaskin, MD;  Location: [facility];  Service: Ophthalmology;  Laterality: Left;  diabetic-oral meds   CHOLECYSTECTOMY     COLONOSCOPY     COLONOSCOPY N/A 10/05/2022   Procedure: COLONOSCOPY;  Surgeon: Toledo, Ladell POUR, MD;  Location: [facility] ENDOSCOPY;  Service: Gastroenterology;  Laterality: N/A;   COLONOSCOPY WITH PROPOFOL  N/A 08/27/2015   Procedure: COLONOSCOPY WITH PROPOFOL ;  Surgeon: Lamar ONEIDA Holmes, MD;  Location: [facility] END[facility];  Service: Endoscopy;  Laterality: N/A;   COLONOSCOPY WITH PROPOFOL  N/A 05/02/2016   Procedure: COLONOSCOPY WITH PROPOFOL ;  Surgeon: Lamar ONEIDA Holmes, MD;  Location: [facility] END[facility]COPY;  Service: Endoscopy;  Laterality: N/A;   ESOPHAGOGASTRODUODENOSCOPY     ESOPHAGOGASTRODUODENOSCOPY N/A 10/05/2022   Procedure: ESOPHAGOGASTRODUODENOSCOPY (EGD);  Surgeon: Toledo, Ladell POUR, MD;  Location: [facility] ENDOSCOPY;  Service: Gastroenterology;  Laterality: N/A;    ESOPHAGOGASTRODUODENOSCOPY (EGD) WITH PROPOFOL  N/A 08/27/2015   Procedure: ESOPHAGOGASTRODUODENOSCOPY (EGD) WITH PROPOFOL ;  Surgeon: Lamar ONEIDA Holmes, MD;  Location: [facility] ENDOSCOPY;  Service: Endoscopy;  Laterality: N/A;   EYE SURGERY     HERNIA REPAIR     IR BONE MARROW BIOPSY & ASPIRATION  10/28/2022   IR KYPHO LUMBAR INC FX REDUCE BONE BX UNI/BIL CANNULATION INC/IMAGING  12/12/2023   IR KYPHO THORACIC WITH BONE BIOPSY  03/19/2024   IR KYPHO THORACIC WITH BONE BIOPSY  04/16/2024   IR RADIOLOGIST EVAL & MGMT  12/05/2023   IR RADIOLOGIST EVAL & MGMT  12/14/2023   IR RADIOLOGIST EVAL & MGMT  01/02/2024   IR RADIOLOGIST EVAL & MGMT  03/14/2024   IR RADIOLOGIST EVAL & MGMT  04/11/2024   MICROLARYNGOSCOPY Right 11/11/2022   Procedure: MICROLARYNGOSCOPY WITH BIOPSY OF PYRIFORM SINUS MASS;  Surgeon: Herminio Miu, MD;  Location: [facility];  Service: ENT;  Laterality: Right;  Diabetic    SOCIAL HISTORY: Social History   Socioeconomic History   Marital status: Widowed    Spouse name: Not on file   Number of children: Not on file   Years of education: Not on file   Highest education level: Not on file  Occupational History   Not on file  Tobacco Use   Smoking status: Former    Current packs/day: 0.00    Types: Cigarettes    Quit date: 24  Years since quitting: 65.9   Smokeless tobacco: Never  Vaping Use   Vaping status: Never Used  Substance and Sexual Activity   Alcohol use: No   Drug use: No   Sexual activity: Not Currently  Other Topics Concern   Not on file  Social History Narrative   Not on file   Social Drivers of Health   Financial Resource Strain: Low Risk  (11/23/2023)   Received from [facility]   Overall Financial Resource Strain (CARDIA)    Difficulty of Paying Living Expenses: Not hard at all  Food Insecurity: No Food Insecurity (11/23/2023)   Received from [facility]   Hunger Vital Sign    Within the past 12 months,  you worried that your food would run out before you got the money to buy more.: Never true    Within the past 12 months, the food you bought just didn't last and you didn't have money to get more.: Never true  Transportation Needs: No Transportation Needs (11/23/2023)   Received from [facility] - Transportation    In the past 12 months, has lack of transportation kept you from medical appointments or from getting medications?: No    Lack of Transportation (Non-Medical): No  Physical Activity: Not on file  Stress: Not on file  Social Connections: Not on file  Intimate Partner Violence: Not At Risk (05/24/2022)   Humiliation, Afraid, Rape, and Kick questionnaire    Fear of Current or Ex-Partner: No    Emotionally Abused: No    Physically Abused: No    Sexually Abused: No    FAMILY HISTORY: Family History  Problem Relation Age of Onset   Hypertension Mother    Ovarian cancer Paternal Grandmother    COPD Father    Hyperlipidemia Father    Kidney disease Father    Diabetes Maternal Grandmother    Breast cancer Neg Hx     ALLERGIES:  is allergic to biaxin [clarithromycin], brompheniramine maleate, ceclor [cefaclor], ciprofloxacin, clinoril [sulindac], codeine sulfate, diovan [valsartan], doxycycline, erythromycin, esomeprazole, fluoxetine, guaifenesin & derivatives, levaquin [levofloxacin in d5w], lisinopril, lodine [etodolac], macrodantin [nitrofurantoin macrocrystal], medrol  [methylprednisolone ], mobic [meloxicam], motrin [ibuprofen], nexium [esomeprazole magnesium ], nitrofurantoin, norvasc [amlodipine besylate], omnicef [cefdinir], penicillins, prednisone , prozac [fluoxetine hcl], pseudoephedrine, reglan [metoclopramide], seldane [terfenadine], sulfa antibiotics, toprol xl [metoprolol tartrate], triamterene, tussionex pennkinetic er [hydrocod poli-chlorphe poli er], vibramycin [doxycycline calcium], and amlodipine.  MEDICATIONS:  Current Outpatient  Medications  Medication Sig Dispense Refill   Ascorbic Acid (VITAMIN C) 1000 MG tablet Take 1,000 mg by mouth 2 (two) times daily.     cephALEXin  (KEFLEX ) 500 MG capsule Take 1 capsule (500 mg total) by mouth 4 (four) times daily. (Patient not taking: Reported on 04/24/2024) 40 capsule 0   cholecalciferol  (VITAMIN D3) 25 MCG (1000 UT) tablet Take 1,000 Units by mouth 2 (two) times daily.     cyanocobalamin  (VITAMIN B12) 1000 MCG tablet Take 1,000 mcg by mouth daily.     fluticasone (FLONASE) 50 MCG/ACT nasal spray Place 2 sprays into both nostrils daily as needed for allergies.     gemfibrozil  (LOPID ) 600 MG tablet Take 600 mg by mouth 2 (two) times daily before a meal.     glipiZIDE (GLUCOTROL XL) 5 MG 24 hr tablet Take 10 mg by mouth in the morning and at bedtime.     hydrochlorothiazide  (HYDRODIURIL ) 25 MG tablet Take 25 mg by mouth daily.     iron  polysaccharides (NIFEREX) 150  MG capsule Take 150 mg by mouth daily.     losartan (COZAAR) 25 MG tablet Take 25 mg by mouth daily.     metFORMIN (GLUCOPHAGE-XR) 500 MG 24 hr tablet Take 1,000 mg by mouth 2 (two) times daily.     Multiple Vitamins-Minerals (HAIR SKIN & NAILS PO) Take by mouth daily.     Multiple Vitamins-Minerals (MULTIVITAMIN WITH MINERALS) tablet Take 1 tablet by mouth daily.     Omega 3 1000 MG CAPS Take 1,000 mg by mouth daily.     pantoprazole  (PROTONIX ) 40 MG tablet Take 40 mg by mouth daily.     Probiotic Product (PROBIOTIC PO) Take by mouth daily.     vitamin E 180 MG (400 UNITS) capsule Take 400 Units by mouth daily.     No current facility-administered medications for this visit.      SABRA  PHYSICAL EXAMINATION: ECOG PERFORMANCE STATUS: 0 - Asymptomatic  Vitals:   04/24/24 1400 04/24/24 1415  BP: (!) 190/87 (!) 189/79  Pulse: 70 81  Resp:    Temp:       Filed Weights   04/24/24 1341  Weight: 141 lb (64 kg)      Physical Exam Constitutional:      Comments: Patient is alone.  Ambulating dependently.   HENT:     Head: Normocephalic and atraumatic.     Mouth/Throat:     Pharynx: No oropharyngeal exudate.  Eyes:     Pupils: Pupils are equal, round, and reactive to light.  Cardiovascular:     Rate and Rhythm: Normal rate and regular rhythm.  Pulmonary:     Effort: Pulmonary effort is normal. No respiratory distress.     Breath sounds: Normal breath sounds. No wheezing.  Abdominal:     General: Bowel sounds are normal. There is no distension.     Palpations: Abdomen is soft. There is no mass.     Tenderness: There is no abdominal tenderness. There is no guarding or rebound.  Musculoskeletal:        General: No tenderness.     Cervical back: Normal range of motion and neck supple.  Skin:    General: Skin is warm.  Neurological:     Mental Status: She is alert and oriented to person, place, and time.  Psychiatric:        Mood and Affect: Affect normal.    LABORATORY DATA:  I have reviewed the data as listed Lab Results  Component Value Date   WBC 5.5 04/24/2024   HGB 9.8 (L) 04/24/2024   HCT 29.3 (L) 04/24/2024   MCV 89.3 04/24/2024   PLT 163 04/24/2024   Recent Labs    09/06/23 1404 01/05/24 1514 04/24/24 1325  NA 138 138 136  K 4.3 4.4 4.3  CL 106 106 103  CO2 23 21* 22  GLUCOSE 182* 194* 168*  BUN 33* 41* 39*  CREATININE 1.04* 1.19* 1.18*  CALCIUM 8.8* 9.3 10.0  GFRNONAA 52* 45* 45*    RADIOGRAPHIC STUDIES: I have personally reviewed the radiological images as listed and agreed with the findings in the report. CT ABDOMEN PELVIS W CONTRAST Result Date: 04/19/2024 EXAM: CT ABDOMEN AND PELVIS WITH CONTRAST 04/17/2024 02:29:31 PM TECHNIQUE: CT of the abdomen and pelvis was performed with the administration of 80 mL of iohexol  (OMNIPAQUE ) 300 MG/ML solution. Multiplanar reformatted images are provided for review. Automated exposure control, iterative reconstruction, and/or weight-based adjustment of the mA/kV was utilized to reduce the radiation dose to as  low as  reasonably achievable. COMPARISON: CT Abdomen Pelvis with and without contrast 06/29/2023. CLINICAL HISTORY: FINDINGS: LOWER CHEST: No acute abnormality. LIVER: The liver is unremarkable. GALLBLADDER AND BILE DUCTS: Status post cholecystectomy. No biliary ductal dilatation. SPLEEN: No acute abnormality. PANCREAS: No acute abnormality. ADRENAL GLANDS: No acute abnormality. KIDNEYS, URETERS AND BLADDER: Focal simple cortical cysts are seen within the left kidney for which no follow up imaging is recommended. Cortical hypodensities within the right kidney are too small to accurately characterize but likely represent additional simple cortical cysts. No follow up imaging recommended. Kidneys are otherwise unremarkable. No stones in the kidneys or ureters. No hydronephrosis. No perinephric or periureteral stranding. Urinary bladder is unremarkable. GI AND BOWEL: Surgical changes of hiatal hernia repair with rim calcified annular surgical device seen at the gastroesophageal junction. Severe descending and sigmoid colonic diverticulosis without superimposed acute inflammatory change. Small fat-containing bilateral inguinal hernias with mild protrusion of the proximal sigmoid colon through the left hernia defect. The stomach, small bowel, and large bowel are otherwise unremarkable. There is no bowel obstruction. PERITONEUM AND RETROPERITONEUM: No ascites. No free air. Mild pelvic floor relaxation with mild prolapse of the middle and posterior compartments. VASCULATURE: Aorta is normal in caliber. No aortic aneurysm. Extensive atherosclerotic calcifications within the infrarenal abdominal aorta. Extensive calcifications at the renal artery origins bilaterally. The degree of stenosis is not well assessed on this noncontrast examination. Nutcracker aortic left renal vein. Peripheral vascular disease. If there is clinical evidence of hemodynamically significant renal artery stenosis, chronic mesenteric ischemia, or lower  extremity claudication, CT arteriography may be more helpful for further evaluation. LYMPH NODES: No lymphadenopathy. REPRODUCTIVE ORGANS: Status post hysterectomy. Residual ovaries are unremarkable. No adnexal masses. BONES AND SOFT TISSUES: Osseous structures are age appropriate. No acute bone abnormality. No lytic or blastic bone lesion. Interval vertebroplasty of T11, T10, and L5. No acute bony abnormality. No focal soft tissue abnormality. IMPRESSION: 1. No acute findings. 2. Severe descending and sigmoid colonic diverticulosis without superimposed acute inflammatory change 3. Small fat-containing bilateral inguinal hernias with mild protrusion of the proximal sigmoid colon through the left hernia defect 4. Peripheral vascular disease. Extensive atherosclerotic calcifications within the infrarenal abdominal aorta and at the renal artery origins bilaterally. If there is clinical evidence of hemodynamically significant renal artery stenosis, CT arteriography may be more helpful for further evaluation Electronically signed by: Dorethia Molt MD 04/19/2024 10:59 PM EST RP Workstation: [id number]   IR KYPHO THORACIC WITH BONE BIOPSY Result Date: 04/16/2024 CLINICAL DATA:  89 year old female with a new highly symptomatic pathologic fracture of the T10 vertebral body. She has failed conservative management and therefore presents for cement augmentation with balloon kyphoplasty. EXAM: FLUOROSCOPIC GUIDED KYPHOPLASTY OF THE  VERTEBRAL BODY COMPARISON:  MRI thoracic spine 04/04/2024 MEDICATIONS: As antibiotic prophylaxis, 1 g vancomycin  was ordered pre-procedure and administered intravenously by the Radiology nurse within 1 hour of incision. Additionally, 1 g Tylenol  was administered intravenously by the Radiology nurse following the procedure. ANESTHESIA/SEDATION: Moderate (conscious) sedation was employed during this procedure. A total of Versed  3 mg and Fentanyl  150 mcg was administered intravenously by the  Radiology nurse. Moderate Sedation Time: 20 minutes. The patient's level of consciousness and vital signs were monitored continuously by radiology nursing throughout the procedure under my direct supervision. FLUOROSCOPY TIME:  Radiation exposure index: 34.7 mGy, air kerma COMPLICATIONS: None immediate. PROCEDURE: The procedure, risks (including but not limited to bleeding, infection, organ damage), benefits, and alternatives were explained to the patient. Questions regarding the procedure  were encouraged and answered. The patient understands and consents to the procedure. The patient was placed prone on the fluoroscopic table. The skin overlying the upper thoracic region was then prepped and draped in the usual sterile fashion. Maximal barrier sterile technique was utilized including caps, mask, sterile gowns, sterile gloves, sterile drape, hand hygiene and skin antiseptic. Intravenous Fentanyl  and Versed  were administered as conscious sedation during continuous cardiorespiratory monitoring by the radiology RN. The right pedicle at T10 was then infiltrated with 1% lidocaine  followed by the advancement of a Kyphon trocar needle through the left pedicle into the posterior one-third of the vertebral body. Subsequently, the osteo drill was advanced to the anterior third of the vertebral body. The osteo drill was retracted. Through the working cannula, a Kyphon inflatable bone tamp 15 x 2.5 was advanced and positioned with the distal marker approximately 5 mm from the anterior aspect of the cortex. Appropriate positioning was confirmed on the AP projection. At this time, the balloon was expanded using contrast via a Kyphon inflation syringe device via micro tubing. Inflations were continued until there was near apposition with the superior end plate. At this time, methylmethacrylate mixture was reconstituted in the Kyphon bone mixing device system. This was then loaded into the delivery mechanism, attached to Kyphon  bone fillers. The balloons were deflated and removed followed by the instillation of methylmethacrylate mixture with excellent filling in the AP and lateral projections. No extravasation was noted in the disk spaces or posteriorly into the spinal canal. No epidural venous contamination was seen. The working cannulae and the bone filler were then retrieved and removed. Hemostasis was achieved with manual compression. The patient tolerated the procedure well without immediate postprocedural complication. IMPRESSION: 1. Technically successful T10 vertebral body augmentation using balloon kyphoplasty. Electronically Signed   By: Wilkie Lent M.D.   On: 04/16/2024 11:27   IR Radiologist Eval & Mgmt Result Date: 04/11/2024 EXAM: ESTABLISHED PATIENT OFFICE VISIT CHIEF COMPLAINT: SEE NOTE IN EPIC HISTORY OF PRESENT ILLNESS: SEE NOTE IN EPIC REVIEW OF SYSTEMS: SEE NOTE IN EPIC PHYSICAL EXAMINATION: SEE NOTE IN EPIC ASSESSMENT AND PLAN: SEE NOTE IN EPIC Electronically Signed   By: Wilkie Lent M.D.   On: 04/11/2024 08:53   MR THORACIC SPINE WO CONTRAST Result Date: 04/04/2024 EXAM: MRI THORACIC SPINE WITHOUT INTRAVENOUS CONTRAST 04/04/2024 11:31:00 AM TECHNIQUE: Multiplanar multisequence MRI of the thoracic spine was performed without the administration of intravenous contrast. COMPARISON: MRI of the thoracic spine dated 03/07/2024. CLINICAL HISTORY: FINDINGS: BONES AND ALIGNMENT: Normal alignment. T10: Mild impression fracture with approximately 15% height loss anteriorly. Upward bowing of the inferior endplate. Bone marrow edema within the inferior vertebral body. T11: Status post interval bilateral vertebral augmentation. Bone marrow signal is otherwise unremarkable. No abnormal enhancement. SPINAL CORD: Normal spinal cord volume. Normal spinal cord signal. SOFT TISSUES: Unremarkable. DEGENERATIVE CHANGES: T3: Left-sided perineural cyst within the left neural foramen. T5: Right-sided perineural cyst within  the right neural foramen. No significant disc herniation. No spinal canal stenosis. IMPRESSION: 1. Mild compression fracture of T10 with approximately 15% anterior height loss, upward bowing of the inferior endplate, and bone marrow edema within the inferior vertebral body, new since 03/07/24. 2. Status post interval bilateral vertebral augmentation at T11. 3. 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 4. Thoracic spinal cord normal in morphology and signal. Electronically signed by: Evalene Coho MD 04/04/2024 11:43 AM EDT RP Workstation: [id number]    ASSESSMENT & PLAN:   Symptomatic anemia  #Chronic mild anemia-hemoglobin  10-11; DEC  2024- Iron  sat-16% ; ferritin- 68 ferritin from pcp down to 29 on 11/7  Not totally consistent with iron  deficiency- ? CKD stage III-  repeat ferritin and Iron  studies pending today.  Goal would be to optimize iron  stores first before retacrit.    # MAY 2024-bone marrow biopsy-not suggestive of myelodysplastic syndrome.  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 MAY 2024-S/p EGD/coloscopy [Dr.Toledo];    # Today hemoglobin is 9.8 recommend proceeding with iron  infusion today, however due to elevated blood pressure with systolic over 180 unable to safely proceed with iron  today return to clinic Friday for repeat blood pressure and possible venofer .   # ITP/ Thrombocytopenia-platelet count- > 100 -stable. on surveillance. Plt 163 today   # Elevated HTN- keep a log BPs; Repeat 180-190/ . On  Losartan- And defer to PCP, Dr.Hande. unstable 189 systolic on recheck today contacted PCP she is to see them tomorrow at 1230 in office due to elevated BP as well as increased back pain today.   # CKD stage III- monitor closely with BP. Hydration. 1.18 today stable  Lower back pain-patient with a long history of lower back pain secondary to a compression fraction of T10 and she has undergone 2 kyphoplasty's in the recent past.  She reports that pain was stable until she rolled  over in bed this week and she felt something pull.  She has been having increased pain since.  I called PCP office and they are going to see her tomorrow at 1230 to address worsening back pain as well as.   # DISPOSITION: # hold venofer  today due to elevated blood pressure # see pcp tomorrow at 12:30 PM for uncontrolled BP and back pain # return to clinic Friday for repeat blood pressure and possible venofer  if bp improved           Morna Husband, NP 04/24/2024 3:23 PM

## 2024-04-25 ENCOUNTER — Encounter: Payer: Self-pay | Admitting: Internal Medicine

## 2024-04-26 ENCOUNTER — Inpatient Hospital Stay

## 2024-04-26 NOTE — Progress Notes (Signed)
 HPI The patient is a pleasant 89 year old female who presents today for follow-up of acute sacral pain, recurrent left buttock, groin, and anterior lateral thigh pain consistent with left hip pain, and left buttock and posterior thigh pain consistent with a lumbar radiculitis after suffering a fall on 10/07/2022.  X-rays of the sacrum and coccyx were negative for fracture although consideration could be made for an MRI to detect sacral insufficiency fracture.  On 12/30/2022 she was evaluated with Dr. Kathlynn for severe left hip degenerative joint disease.  She was worked in for a left sacroiliac joint injection under ultrasound with Dr. Sharrie.  She reports moderate relief following the sacroiliac joint injection.   She had a bone marrow biopsy pending (pancytopenia).    MRI dated 12/31/2022 demonstrates: At L2-3 there is been slight progression of central stenosis (now mild to moderate). At L3-4 there is moderate central stenosis.  She is being followed by Dr. Herminio and Dr. Maree for hearing loss. She is also been dealing with left-sided neck pain originating at the C3 level and radiating down to the C6 level.  This is being treated by Dr. Malvina.She was followed by Dr. Herminio and noted to have left-sided neck pain with numbness behind the left ear.  She subsequently developed some radiating pain to the left shoulder and brachial region.  MRI of the cervical spine was obtained and demonstrated at C3-4 a moderately large right and left paracentral disc extrusion associated with moderate central stenosis.  In addition she had severe left-sided facet arthrosis at C2-3 greater than C3-4.  She was referred to Dr. Katrina.  On 10/07/2022 she was sitting in a chair when the chair broke and she landed on her tailbone.   On 10/11/2022 she saw Dr. Clois.  At that time she had improvement of her left shoulder and brachial region pain.  He felt that her symptoms were primarily related to shoulder issues.  He  started her in physical therapy.  She reports further improvement of her neck pain and left shoulder pain.  The symptoms have been more manageable.  She has previously been followed for acute bilateral low back pain with radiation the the bilateral buttock, posterior thigh and calf.  Her pain began in early July 2022 She states she cleaned her house and noted a small amount of pain then had severe pain the next day without injury.  She describes her pain as moderate to severe that is throbbing, stiff and constant.  She feels that she has an associated feeling of weakness that is unchanged since onset.  Standing, lifting, squatting and stairs increases her pain.  Rest along with lying in bed alleviates her pain.  Heat and ice provide mild to moderate benefit.  Chiropractic manipulation by Dr. Malvina provided little benifit.   Medications have included Tylenol  (mild benefit patient avoids most medications as she is very sensitive to them).  She has a history of pancytopenia and thrombocytopenia with last platelet count of 167 on 11/16/23.  She has an extensive allergy list.  She lives alone her husband passed away approximately 5 years ago.  On 04/01/2022 CT of the abdomen/pelvis with contrast was obtained and was essentially unremarkable.  On 04/07/2022 she underwent a left hip joint injection and presents today in follow-up of that procedure.  She has seen Dr. Malvina with mild benefit.   03/29/2023 left lower extremity ultrasound was negative for DVT.  02/27/2023 left lower extremity ultrasound was negative for DVT.  MRI dated 12/01/2023 demonstrated  L5 superior endplate vertebral compression fracture with 30 to 40% vertebral body height loss with marrow edema amongst other findings.  On 12/12/2023 she underwent L5 kyphoplasty.  On 12/21/2023 she saw Dr. Clois.  Consideration was made for physical therapy but this should be after she has further healing after her kyphoplasty.  On 02/20/2024 she had a  sinus infection that turned into bronchitis and she was coughing hard.  After 1 particular cough she felt a pop in her back associate with severe onset of thoracolumbar junction pain.  She had mild radiation along the left lower thoracic region.   She was last evaluated on 03/27/2024 by Dr. Avanell at which time she was experiencing continued pain after T11 kyphoplasty and was then noted to have a T10 compression deformity on x-ray for which she underwent balloon kyphoplasty on 04/16/2024.  She was evaluated by her PCP on 04/25/2024 continued back pain and thoracic spine x-ray was performed  MRI dated 03/07/2024 demonstrated an acute T11 compression fracture with diffuse bone marrow edema.  On 03/19/2024 she underwent T11 kyphoplasty.   At today's visit she describes acute pain in her right lateral rib cage without cause.  She states that this has flared over the last several days.  She has tenderness to the right lateral rib cage at approximately T11.  Discussed her pain is moderate to severe thoracic spine x-ray was negative for acute rib fracture.  She states she is taking Tylenol  3 25 and we discussed increasing this to 1000 mg 3 times a day to target her pain and use of the lidocaine  patch rib x-rays were obtained in the office today.  She denies any shortness of breath or changes to bowel or bladder.  No fever or chills.     The [place]  Narcotic Database was reviewed today.   Procedures: 03/29/2024: Left T11-12 transforaminal ESI  03/19/2024: Kyphoplasty of the T11 vertebral body 02/08/2024: Left L4-5 and left S1 transforaminal ESI (moderate to good relief) 12/12/2023: Kyphoplasty of the L5 vertebral body 11/24/2023: Left sacroiliac joint injection (with moderate relief) 10/05/2023: Left lateral gluteal trigger point injection (moderate to good relief) 09/13/2023: Left hip joint injection (minimal relief) 08/29/2023: Left L3-4 and left L4-5 transforaminal ESI (mild relief x 1 to 2  days) 06/27/2023: Left ultrasound-guided hip injection by Dr.Kubinski  02/27/2023: Left L3-4 and L4-5 transforaminal ESI (50% relief) 12/30/2022: Left sacroiliac joint injection under ultrasound (moderate relief, Dr. Sharrie) 10/21/2022: Left hip joint injection (less effective) 05/19/2022: Right gluteal trigger point injection (questionable postinjection flare, then good relief?) 04/07/2022: Left hip joint injection (good relief) 03/04/2022: Left L5-S1 and left L4-5 transforaminal ESI (no relief) 01/07/2022: Bilateral S1 transforaminal ESI (complete relief of her right leg pain pain now on her left low back and anterior and lateral thigh and shin) 12/24/2021: Left U/S guided SI joint injection by Dr. Sharrie (no relief)  Past Medical History:  Diagnosis Date  . Abdominal pain, RUQ (right upper quadrant) 07/30/2015  . Allergic state   . Change in bowel habits 01/22/2019  . Depression   . Diabetes mellitus type 2, uncomplicated (CMS/HHS-HCC)   . Fibrocystic breast disease   . Fibromyalgia   . Gastroesophageal reflux disease without esophagitis 07/30/2015  . GERD (gastroesophageal reflux disease)   . History of bone density study 11/25/2010  . History of skin cancer    Arm, Neck and left side of face.  SABRA History of urinary tract infection   . Hx of adenomatous colonic polyps 01/22/2019  . Hypertension   .  IBS (irritable bowel syndrome)   . Migraine   . Nausea 07/30/2015  . Osteoarthritis   . Pancytopenia (CMS-HCC) 08/23/2016  . Thrombocytopenia () 06/29/2017    Past Surgical History:  Procedure Laterality Date  . COLONOSCOPY  11/03/2009   Fundic Gland Polyp: [facility]BF 10/2014; Recall Ltr mailed 07/31/2014 (dw)  . EGD  11/03/2009   No repeat per RTE  . COLONOSCOPY  08/27/2015   Tubulovillous Adenoma w/High-Grade Dysplasia: CBF 02/2016; Recall Ltr mailed 12/21/2015 (dw)  . EGD  08/27/2015   No repeat per RTE  . COLONOSCOPY  05/02/2016   Adenomatous Polyps: No repeat due to age per RTE  (dw)  . CATARACT EXTRACTION W/ INTRAOCULAR LENS IMPLANT & ANTERIOR VITRECTOMY, BILATERAL Bilateral 04/2017  . Colon @ [facility]  10/05/2022   Tubular adenomas/No repeat/TKT  . EGD @ [facility]  10/05/2022   Normal EGD biopsy/No repeat/TKT  . APPENDECTOMY    . CHOLECYSTECTOMY    . ENDOSCOPY OF BILIARY DUCT    . HERNIA REPAIR    . HYSTERECTOMY    . Skin cancer removal     Left arm, neck x2, and left side of face.    Social History   Socioeconomic History  . Marital status: Widowed  . Number of children: 4  . Years of education: 12+  Occupational History  . 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 Smoking status: Former    Current packs/day: 0.00    Types: Cigarettes    Quit date: 11/15/1956    Years since quitting: 67.4  . Smokeless tobacco: Never  Vaping Use  . Vaping status: Never Used  Substance and Sexual Activity  . Alcohol use: No    Alcohol/week: 0.0 standard drinks of alcohol  . Drug use: No  . Sexual activity: Defer    Partners: Male   Social Drivers of Health   Financial Resource Strain: Low Risk  (04/25/2024)   Overall Financial Resource Strain (CARDIA)   . Difficulty of Paying Living Expenses: Not hard at all  Food Insecurity: No Food Insecurity (04/25/2024)   Hunger Vital Sign   . Worried About Programme Researcher, Broadcasting/film/video in the Last Year: Never true   . Ran Out of Food in the Last Year: Never true  Transportation Needs: No Transportation Needs (04/25/2024)   PRAPARE - Transportation   . Lack of Transportation (Medical): No   . Lack of Transportation (Non-Medical): No    Family History  Problem Relation Name Age of Onset  . Pneumonia Mother    . Alzheimer's disease Mother    . Diabetes Mother    . Diabetes Father    . Emphysema Father    . High blood pressure (Hypertension) Father    . Asthma Father    . Allergic rhinitis Father    . Cancer Sister  73       lung cancer  . Ovarian cancer Paternal Grandmother      Current Outpatient  Medications on File Prior to Visit  Medication Sig Dispense Refill  . albuterol MDI, PROVENTIL, VENTOLIN, PROAIR, HFA 90 mcg/actuation inhaler Inhale 2 inhalations into the lungs every 6 (six) hours as needed for Wheezing 1 each 2  . ascorbic acid, vitamin C, (VITAMIN C) 1000 MG tablet Take 1,000 mg by mouth once daily    . calcium carbonate 500 mg calcium (1,250 mg) tablet Take 1 tablet (1,250 mg of salt total) by mouth 2 (two) times daily with meals 60 tablet 0  .  cholecalciferol  (VITAMIN D3) 1000 unit tablet Take 1 tablet by mouth once daily    . clidinium-chlordiazePOXIDE (LIBRAX) 5-2.5 mg capsule Take 1 capsule by mouth 2 (two) times daily as needed 30 capsule 5  . CRANBERRY EXTRACT (CRANBERRY ORAL) Take 2 tablets by mouth 2 (two) times daily    . CYANOCOBALAMIN , VITAMIN B-12, (VITAMIN B-12 ORAL) Take 2,000 mcg by mouth once daily      . dicyclomine (BENTYL) 10 mg capsule 2 capsules    . fluticasone propionate (FLONASE) 50 mcg/actuation nasal spray Place 1 spray into both nostrils 2 (two) times daily 1 g 5  . gemfibroziL  (LOPID ) 600 mg tablet Take 1 tablet (600 mg total) by mouth 2 (two) times daily before meals for 180 days 180 tablet 1  . glipiZIDE (GLUCOTROL) 10 MG tablet Take 1 tablet (10 mg total) by mouth 2 (two) times daily before meals 180 tablet 1  . hydroCHLOROthiazide  (HYDRODIURIL ) 25 MG tablet Take 1 tablet by mouth once daily 90 tablet 1  . IFEREX 150 150 mg iron  capsule Take 1 capsule by mouth once daily 90 capsule 1  . insulin  GLARGINE (LANTUS U-100 INSULIN ) injection (concentration 100 units/mL) 20 units qd 10 mL 3  . Lactobacillus acidophilus 10 billion cell Cap Take 1 capsule by mouth once daily.    SABRA levoFLOXacin (LEVAQUIN) 250 MG tablet Take 1 tablet (250 mg total) by mouth once daily for 10 days 10 tablet 0  . lidocaine  4 % Gel Apply 1 Application topically 3 (three) times a day 120 mL 1  . losartan (COZAAR) 25 MG tablet Take 1 tablet (25 mg total) by mouth once daily 90  tablet 1  . metFORMIN (GLUCOPHAGE-XR) 500 MG XR tablet Take 2 tablets by mouth twice daily 360 tablet 0  . metroNIDAZOLE (FLAGYL) 500 MG tablet Take 1 tablet (500 mg total) by mouth 3 (three) times daily for 10 days 30 tablet 0  . MULTIVITAMIN W-MINERALS/LUTEIN (CENTRUM SILVER ORAL) Take 1 caplet by mouth once daily    . omega-3 fatty acids-vitamin E 1,000 mg Take 1 capsule by mouth 2 (two) times daily.      . pantoprazole  (PROTONIX ) 40 MG DR tablet 1 tablet Orally Once a day; Duration: 30 day(s)    . SITagliptin phosphate (JANUVIA) 50 MG tablet Take 1 tablet (50 mg total) by mouth once daily 30 tablet 2  . blood glucose diagnostic (TRUE METRIX GLUCOSE TEST STRIP) test strip 3 (three) times daily for 180 days Use as instructed. 300 strip 1  . blood glucose meter kit 3 (three) times daily 1 each 0  . lancets (TRUEPLUS LANCETS) 3 (three) times daily for 180 days Use as instructed. 300 each 1  . lancets 33 gauge Misc Use 1 each 3 (three) times daily 300 each 3   No current facility-administered medications on file prior to visit.    Allergies as of 04/26/2024 - Reviewed 04/26/2024  Allergen Reaction Noted  . Dyrenium [triamterene] Headache 08/26/2015  . Fluoxetine hcl Other (See Comments) 08/26/2015  . Omnicef [cefdinir] Hives 08/26/2015  . Dextromethorphan-guaifenesin Other (See Comments) 02/13/2024  . Metoclopramide Other (See Comments) 02/13/2024  . Biaxin [clarithromycin] Other (See Comments) 08/26/2015  . Brompheniramine maleate Other (See Comments) 05/14/2015  . Ceclor [cefaclor] Other (See Comments) 08/26/2015  . Cipro [ciprofloxacin] Nausea 08/26/2015  . Clinoril [sulindac] Cough 08/26/2015  . Decongestant [pseudoephedrine hcl] Other (See Comments)   . Diovan [valsartan] Headache 08/26/2015  . Doxycycline calcium Other (See Comments) 05/14/2015  . Erythromycin (  bulk) Other (See Comments)   . Esomeprazole sodium Headache 05/14/2015  . Guaifenesin Other (See Comments) 05/14/2015   . Levaquin [levofloxacin] Itching and Muscle Pain 11/04/2013  . Lisinopril Cough 08/26/2015  . Lodine [etodolac] Other (See Comments) 08/26/2015  . Macrodantin [nitrofurantoin macrocrystalline] Nausea 10/25/2013  . Medrol  [methylprednisolone ] Headache 08/26/2015  . Mobic [meloxicam] Headache 08/26/2015  . Motrin [ibuprofen] Other (See Comments) 08/26/2015  . Nexium [esomeprazole magnesium ] Headache   . Norvasc [amlodipine] Other (See Comments) 08/26/2015  . Penicillins Itching 08/26/2015  . Prozac [fluoxetine] Other (See Comments) 08/26/2015  . Pseudoephedrine Other (See Comments) 08/26/2015  . Sulfa (sulfonamide antibiotics) Rash   . Toprol xl [metoprolol succinate] Headache 08/26/2015  . Tussi-organidin nr [codeine-guaifenesin] Nausea   . Zoloft [sertraline] Nausea 09/21/2015   ROS More than 10 system, review of system form was given to the patient to fill out and has been signed by Dr. Avanell and scanned into the patient's chart.   Vital signs Vitals:   04/26/24 0809 04/26/24 0908  BP: (!) 209/98 (!) 161/78  Pulse: 88   Temp: 36.2 C (97.2 F)   TempSrc: Oral   Weight: 64 kg (141 lb 1.5 oz)   Height: 170.2 cm (5' 7.01)   PainSc:   8   PainLoc: Back     Exam General:  Alert oriented well nourished no distress   Lumbosacral Exam (performed 10/13/2022) On inspection no rash is noted.  She has mild tenderness to palpation to the lumbosacral paraspinal musculature.  She has severe diffuse tenderness over the sacrum and tailbone.  Extension rotation is not performed due to fall risk.  Lower Extremity Exam She has 5/5 strength in bilateral dorsiflexors, knee extensors, 4/5 strength in bilateral hip flexors.  Sensation intact to light touch bilaterally.  She has absent patellar and Achilles reflexes.  She does not have ankle clonus.  Range of motion of the left hip produces left buttock and anterior lateral thigh pain.  Range of motion of the right hip does not produce pain.   Straight leg raise is negative bilaterally.   Radiographic Data Right rib x-rays from [facility] dated 04/26/2024 imaging reviewed awaiting official radiology report.  Thoracic spine x-ray from [facility] clinic dated 04/25/2024.  Revealed T11 vertebral augmentation interval T10 vertebral augmentation and multilevel degenerative changes.   Impression 1.  Acute thoracolumbar junction pain after coughing hard on 02/20/2024.  Pain radiates into the left flank region.  Clinically she has symptoms suspicious for a lower thoracic radiculitis.  MRI dated 03/07/2024 demonstrates an acute T11 compression fracture.  Unfortunately kyphoplasty on 03/19/2024 has been of minimal benefit.  X-rays obtained today demonstrate a subtle possible T10 compression fracture.  X-rays of the thoracic spine, 2 views dated 03/27/2024 were reviewed. Patient status post cement augmentation T11.  Wedge compression deformity appreciate involving T10 with vertebral body height loss of 10 to 15% age-indeterminate.  No further evidence of acute fracture no dislocation.  Mild multilevel disc base narrowing is appreciated.  Kyphotic curvature of the thoracic spine is maintained.  Soft tissues unremarkable.  Lap-Band is demonstrated.   IMPRESSION:  Bridge deformity involving T10 age-indeterminate.  No further evidence of acute fracture or dislocation.  Mild degenerative disc disease changes. FREDDERICK LEMOND FELL, MD   X-rays of the left rib cage dated 03/27/2024 were reviewed. No consolidation, infiltrate, effusion or edema. Cardiac silhouette unremarkable Ribs: Osseous structures and soft tissues are unremarkable.    IMPRESSION:  Chest: No acute cardiopulmonary disease. Ribs: No evidence of fractureno  dislocation.  HECTOR LEMOND FELL, MD  MRI of the thoracic spine without contrast was reviewed. MRI THORACIC SPINE WITHOUT INTRAVENOUS CONTRAST  03/07/2024 08:32:00 PM  TECHNIQUE:  Multiplanar multisequence MRI of the  thoracic spine was performed without the  administration of intravenous contrast.  COMPARISON:  03/29/2012.  CLINICAL HISTORY:  Suspected compression fracture.  FINDINGS:  BONES AND ALIGNMENT:  Normal alignment. Wedge compression fracture of T11 with less than 25% height  loss. Diffuse bone marrow edema at T11. Small osteonecrotic cleft beneath the  superior endplate of T11 suggests a subacute timeline. Vertebral body heights  are otherwise normal. Bone marrow signal is otherwise unremarkable. No abnormal  enhancement.   SPINAL CORD:  Normal spinal cord volume. Normal spinal cord signal.   SOFT TISSUES:  Unremarkable.   DEGENERATIVE CHANGES:  No significant disc herniation. No spinal canal stenosis or neural foraminal  narrowing.   IMPRESSION:  1. Early subacute wedge compression fracture of T11 with less than 25% height  loss, diffuse bone marrow edema.   Electronically signed by: Franky Stanford MD 03/07/2024 09:09 PM EDT   2.  Acute on chronic low back pain with radiation into the left lateral hip, anterior lateral thigh, and anterior lateral lower leg.  Clinically she has symptoms consistent with a lumbar radiculitis.  MRI lumbar spine with and without contrast from [facility] dated 02/29/2024 images and report were reviewed today.  EXAM:  MRI LUMBAR SPINE WITHOUT AND WITH CONTRAST   TECHNIQUE:  Multiplanar and multiecho pulse sequences of the lumbar spine were  obtained without and with intravenous contrast.   CONTRAST:  6mL GADAVIST  GADOBUTROL  1 MMOL/ML IV SOLN  COMPARISON:  Lumbar MRI 12/01/2023 and earlier. CT Abdomen and  Pelvis 06/29/2023.  FINDINGS:  Segmentation: Normal on the comparison CT, the same numbering system  used on the June MRI.  Alignment: Lumbar lordosis appears stable since June. Subtle  multilevel anterolisthesis L2-L3 through L5-S1. No significant  scoliosis.  Vertebrae: Normal background bone marrow signal. Chronic mild L3 and  L4 compression  fractures. Previously augmented L5 compression  fracture.  Patchy L3 vertebral body and superior endplate STIR signal  heterogeneity to the right of midline. Endplate compression there  appears stable from previous exams. And stir signal changes also  stable. Intact visible sacrum and SI joints. Maintained lower  thoracic and L1 vertebral height. Minimal L2 inferior endplate  compression is chronic and stable. No acute osseous abnormality  identified.  Conus medullaris and cauda equina: Conus extends to the L1-L2 level.  No lower spinal cord or conus signal abnormality. No abnormal  intradural enhancement. No dural thickening.  Paraspinal and other soft tissues: Stable and negative visible  abdominal viscera. No acute paraspinal soft tissue finding.  Disc levels:  Stable multifactorial degenerative lumbar spinal stenosis: L2-L3: Moderate (series 8, image 13) with asymmetric moderate left  lateral recess stenosis at the descending left L3 nerve level.  L3-L4: Severe (series 8, image 18) with asymmetric moderate to  severe right lateral recess stenosis at the descending right L4  nerve level).  L4-L5: Mild (series 8, image 22) with asymmetric mild to moderate  right lateral recess stenosis at the descending right L5 nerve  level). Moderate to severe right L4 neural foraminal stenosis.  L5-S1: No significant spinal stenosis, mild bilateral lateral recess  stenosis (S1 nerve levels) and severe bilateral L5 neural foraminal  stenosis.  IMPRESSION:  1. No acute osseous abnormality. Chronic L2 through L5 compression  fractures, previously augmented at L5.  2.  L2-L3 through L4-L5 multifactorial degenerative spinal stenosis  is stable since June;  Severe at L3-L4, with associated lateral recess stenosis maximal at  that level.  And chronic neural foraminal stenosis which is Severe at the  bilateral L5 nerve levels, moderate to severe at the right L4 nerve  level.  Electronically Signed     By: VEAR Hurst M.D.    On: 03/03/2024 09:55   3.  Left buttock, groin, and anterior lateral thigh pain.  Clinically she has symptoms consistent with degenerative joint disease of the hip. MR OF THE LEFT HIP WITHOUT CONTRAST  TECHNIQUE:  Multiplanar, multisequence MR imaging was performed. No intravenous  cotrast was administered.  COMPARISON:  CT abdomen and pelvis 05/23/2022  FINDINGS:  Bones: Mild pubic symphysis joint space narrowing and peripheral  osteophytosis. Severe right L4-5 and moderate to severe right L5-S1  disc space narrowing and peripheral endplate osteophytes.  Articular cartilage and labrum   Left hip:  Cartilage: There is focal high-grade subchondral marrow edema within  the superior, mid to anterior left femoral head (coronal series 12,  image 10 and sagittal series 11, image 14). Minimal overlying  cortical flattening. Moderate to high-grade superior left femoral  head and acetabular cartilage thinning. Mild-to-moderate femoral  head-neck junction and peripheral acetabular degenerative  osteophytes.  Labrum: Mild attenuation of the superior left glenoid labrum with  high-grade attenuation the and degenerative irregularity of the  anterosuperior left acetabular labrum.  On large field-of-view images, there is might high-grade thinning of  the superior right femoroacetabular cartilage. Mild right femoral  head-neck junction circumferential degenerative osteophytes, less  severe than the left side.  Joint or bursal effusion  Joint effusion:  No joint effusion within either hip.  Bursae: Mild fluid within the left trochanteric bursa.  Muscles and tendons  Muscles and tendons: Origins of the lateral rectus femoris tendons  are intact. Mild-to-moderate fluid within the midsubstance of the  bilateral common hamstring tendon origins, mild-to-moderate  tenosynovitis. No tendon retraction. Partial-thickness tear of the  posterior aspect of the left gluteus minimus  tendon insertion. The  right gluteus minimus and bilateral gluteus medius tendon insertions  are intact.  Other findings  Miscellaneous: The rectus abdominis-adductor aponeuroses are intact.  Moderate to high-grade sigmoid diverticulosis without inflammatory  changes to indicate acute diverticulitis. The uterus is surgically  absent.   IMPRESSION:  1. Focal high-grade subchondral marrow edema within the superior,  mid to anterior left femoral head with minimal overlying cortical  flattening. This is compatible with an acute to subacute subchondral  insufficiency fracture.  2. Moderate to high-grade left femoroacetabular cartilage thinning.  3. Partial-thickness tear of the posterior aspect of the left  gluteus minimus tendon insertion.  4. Mild left trochanteric bursitis.  5. Mild-to-moderate bilateral common hamstring tendon origin  tenosynovitis.  6. Severe right L4-5 and moderate to severe right L5-S1 degenerative  disc and endplate changes.   Electronically Signed    By: Tanda Lyons M.D.    On: 05/02/2023 19:53    4.  Acute on chronic left-sided neck pain with radiation to the left shoulder and brachial region.  Clinically she has some symptoms consistent with a cervical radiculitis (C3-4 disc extrusion) and some symptoms consistent with shoulder pathology.  She also appears to have an element of left-sided OA neck pain related to severe facet arthropathy of the left C2-3 and C3-4 facet joints.MRI of the cervical spine from [facility] dated 09/16/2022 both images and report were reviewed today.  COMPARISON:  Cervical spine radiographs 12/07/2021. Neck CT 11/04/2021. FINDINGS: Alignment: Straightening of the normal cervical lordosis. Trace anterolisthesis of C2 on C3, C3 on C4, C4 on C5, and C7 on T1 and trace retrolisthesis retrolisthesis of C5 on C6, similar to the prior CT. Vertebrae: No fracture, suspicious marrow lesion, or significant marrow edema. Chronic degenerative  endplate changes at C5-6. Cord: Normal signal. Posterior Fossa, vertebral arteries, paraspinal tissues: Unremarkable. Disc levels: C2-3: Minimal disc bulging, minimal uncovertebral spurring, and severe left facet arthrosis result in mild left neural foraminal stenosis without spinal stenosis. C3-4: A moderately large left paracentral disc extrusion results in moderate spinal stenosis with left ventral cord flattening. Moderate facet arthrosis. Patent neural foramina. C4-5: Small left central disc protrusion and mild right and moderate left facet arthrosis without significant stenosis. C5-6: Severe disc space narrowing. A broad-based posterior disc osteophyte complex and mild facet arthrosis result in mild spinal stenosis and mild-to-moderate bilateral neural foraminal stenosis. C6-7: Mild disc bulging, a small left paracentral disc protrusion, and mild facet arthrosis result in mild left neural foraminal stenosis without spinal stenosis. C7-T1: Disc uncovering and mild facet arthrosis without significant stenosis. IMPRESSION: 1. Moderately large left paracentral disc extrusion at C3-4 with moderate spinal stenosis. 2. Mild spinal stenosis and mild-to-moderate neural foraminal stenosis at C5-6. 3. Advanced upper cervical facet arthrosis. Electronically Signed   By: Dasie Hamburg M.D.   On: 09/20/2022 16:12  5.   Acute bilateral low back pain with radiation the the bilateral buttock, posterior thigh and calf. Clinically her pain is most consistent with a lumbosacral radiculitis possibly related to her spinal narrowing at L3-4.  Lumbar spine xray from [facility] clinic dated 12/29/2021 imaging reviewed degenerative changes.   6.  Left sacroiliitis. 7.  Type 2 diabetes.  8.  Thrombocytopenia last platelet count was 124 on 09/06/2023. 9.  Extensive allergy list.   Plan 1. Increase tylenol  2 tablets TID prn (total 3000mg  per day as needed). She does not tolerate medications well.  She  will be watchful for side effects. 2.  Continue gabapentin  100 mg nightly as prescribed by neurology. 3.  She may continue tizanidine 2 mg as needed muscle spasms. 4.  Over-the-counter lidocaine  patch. 5.  Right rib x-rays obtained and reviewed in office today. 6.  She will avoid any strenuous activity at this time. 7.  Will call with radiology report.  She will seek treatment if she was to experience any shortness of breath or any other symptom changes.  Patient states she will call us  on Monday to let us  know how she is doing.     WHITNEY MEELER, NP  This note was generated in part with voice recognition software and I apologize for any typographical errors that were not detected and corrected.  PCP: Dr. Vish Hande

## 2024-04-26 NOTE — Telephone Encounter (Signed)
 Spoke to pt.

## 2024-04-26 NOTE — Progress Notes (Signed)
 Patient came for venofer  infusion today. Initial bp 207/97. Per pt, she saw her PCP and they didn't want to alter her bp medicine regimen at this time because they contribute these recent high readings to uncontrolled pain. She was started on a muscle relaxer for her pain and told to continue taking her bp meds as prescribed. BP recheck 191/81 after pt sat for 10 minutes. Lanell, NP notified of both readings. After another 15 minutes bp 185/85. Perkins, NP notified and ordered to continue to hold venofer  at this time due to hypertension. Pt educated to continue to monitor at home and keep a log. Pt educated to notify PCP if pressures remain high after starting new muscle relaxers for pain and continuing her normal bp medications. Pt educated to seek emergency care if pressures remain elevated or she becomes symptomatic of hypertension. Pt verified understanding. Pt's venofer  infusion was rescheduled for this Monday.

## 2024-04-29 ENCOUNTER — Inpatient Hospital Stay

## 2024-04-29 NOTE — Telephone Encounter (Signed)
 LMTRC

## 2024-04-29 NOTE — Progress Notes (Signed)
 Patient here today for iron  infusion. Per the parameters, her BP is too high to receive IV iron  today. Patient states that she is in a lot of pain and her BP has been running really high. She has tried to reach her PCP several times, but has not been successful. Appointment made here to see NP and possible iron  infusion for next week.

## 2024-04-29 NOTE — Telephone Encounter (Signed)
 Pt notified. She states that Tylenol  is not providing relief. She understands that treatment options are limited.

## 2024-04-29 NOTE — Telephone Encounter (Signed)
 Please make patient aware that radiology did note possible nondisplaced right sixth rib fracture.  This does explain her pain.  How is she doing?

## 2024-05-06 ENCOUNTER — Other Ambulatory Visit: Payer: Self-pay | Admitting: *Deleted

## 2024-05-06 DIAGNOSIS — D508 Other iron deficiency anemias: Secondary | ICD-10-CM

## 2024-05-07 ENCOUNTER — Encounter: Payer: Self-pay | Admitting: Nurse Practitioner

## 2024-05-07 ENCOUNTER — Inpatient Hospital Stay: Attending: Internal Medicine

## 2024-05-07 ENCOUNTER — Other Ambulatory Visit: Payer: Self-pay

## 2024-05-07 ENCOUNTER — Inpatient Hospital Stay: Admitting: Nurse Practitioner

## 2024-05-07 VITALS — BP 192/72 | HR 76 | Temp 96.6°F | Resp 20

## 2024-05-07 DIAGNOSIS — D509 Iron deficiency anemia, unspecified: Secondary | ICD-10-CM | POA: Diagnosis present

## 2024-05-07 DIAGNOSIS — Z87891 Personal history of nicotine dependence: Secondary | ICD-10-CM | POA: Insufficient documentation

## 2024-05-07 DIAGNOSIS — I10 Essential (primary) hypertension: Secondary | ICD-10-CM | POA: Diagnosis not present

## 2024-05-07 DIAGNOSIS — D696 Thrombocytopenia, unspecified: Secondary | ICD-10-CM | POA: Diagnosis not present

## 2024-05-07 DIAGNOSIS — Z8041 Family history of malignant neoplasm of ovary: Secondary | ICD-10-CM | POA: Diagnosis not present

## 2024-05-07 DIAGNOSIS — Z79899 Other long term (current) drug therapy: Secondary | ICD-10-CM | POA: Diagnosis not present

## 2024-05-07 DIAGNOSIS — R5382 Chronic fatigue, unspecified: Secondary | ICD-10-CM | POA: Diagnosis not present

## 2024-05-07 DIAGNOSIS — D649 Anemia, unspecified: Secondary | ICD-10-CM

## 2024-05-07 DIAGNOSIS — D508 Other iron deficiency anemias: Secondary | ICD-10-CM

## 2024-05-07 DIAGNOSIS — Z85820 Personal history of malignant melanoma of skin: Secondary | ICD-10-CM | POA: Diagnosis not present

## 2024-05-07 DIAGNOSIS — Z85828 Personal history of other malignant neoplasm of skin: Secondary | ICD-10-CM | POA: Diagnosis not present

## 2024-05-07 LAB — HEMOGLOBIN AND HEMATOCRIT (CANCER CENTER ONLY)
HCT: 31 % — ABNORMAL LOW (ref 36.0–46.0)
Hemoglobin: 10.1 g/dL — ABNORMAL LOW (ref 12.0–15.0)

## 2024-05-07 NOTE — Progress Notes (Unsigned)
 [facility] CONSULT NOTE  Patient Care Team: Sadie Manna, MD as PCP - General (Internal Medicine) Sadie Manna, MD as Physician Assistant (Internal Medicine) Rennie Cindy SAUNDERS, MD as Consulting Physician (Oncology)  CHIEF COMPLAINTS/PURPOSE OF CONSULTATION: Iron  deficiency anemia/thrombocytopenia  # Chronic [2015] mild anemia hemoglobin 11.1-question iron  deficiency versus others.-Dr. Elliott/Dr. Rudell; s/p Venofer . MAY 2024- BONE MARROW, ASPIRATE, CLOT, CORE:  - Normocellular bone marrow (20%) with trilineage hematopoiesis and no  increase in blasts. Morphologic evaluation and immunohistochemical analysis do not provide a precise explanation for the patient's anemia and thrombocytopenia. The  absence of significant morphologic dysplasia makes a myelodysplastic syndrome less likely and secondary causes should be considered; however, correlation with pending cytogenetic and molecular studies will be of interest to exclude a morphologically subtle evolving myeloid neoplasm.   # Mild thrombocytopenia platelets 120s  # Fatigue/ PN/DM; MELANOMA of mid back [May 2021] s/p excision. MELANOMA LEFT neck posterior-s/p excision. [facility] 2023]  Oncology History   No history exists.    HISTORY OF PRESENTING ILLNESS: Alone.  Ambulating independently.  Kim Ballard 89 y.o. female with longstanding history of iron  deficiency anemia and thrombocytopenia with chronic fatigue, depression, and refractory hypertension who presents to clinic for follow-up and reevaluation for Venofer .  Venofer  has been held due to hypertension.  She saw her PCP who upped her blood pressure medicine.  She has not noticed improvement in her blood pressure readings.  Was previously told that she needed to see vascular surgery but has not followed up.  Review of Systems  Constitutional:  Positive for malaise/fatigue. Negative for chills, fever and weight loss.  Eyes:  Negative for double vision and  photophobia.  Respiratory:  Negative for cough and shortness of breath.   Cardiovascular:  Negative for chest pain, palpitations, orthopnea and leg swelling.  Gastrointestinal:  Negative for abdominal pain, blood in stool, heartburn, melena and nausea.  Musculoskeletal:  Positive for back pain and joint pain. Negative for falls.       Right rib pain- known fracture  Neurological:  Negative for dizziness, tingling, focal weakness, weakness and headaches.  Endo/Heme/Allergies:  Does not bruise/bleed easily.  Psychiatric/Behavioral:  Positive for depression. The patient is not nervous/anxious and does not have insomnia.      MEDICAL HISTORY:  Past Medical History:  Diagnosis Date   Allergic state    Anemia    Arthritis    Cancer (HCC)    skin cancers   Chronic cystitis with hematuria    Complication of anesthesia 10/28/2022   Excessive night sweats and itching after procedure 10/28/22   Depression    Diabetes mellitus without complication (HCC)    Fibrocystic disease of both breasts    Fibromyalgia    GERD (gastroesophageal reflux disease)    Hypertension    IBS (irritable bowel syndrome)    Melanoma (HCC) 07/09/2021   lt neck/shoulder area   Melanoma of neck (HCC) 09/06/2021   left   Nausea    Skin cancer    Wears dentures    full upper and lower    SURGICAL HISTORY: Past Surgical History:  Procedure Laterality Date   ABDOMINAL HYSTERECTOMY     APPENDECTOMY     BREAST BIOPSY Right 2001   surgical bx    CATARACT EXTRACTION W/PHACO Right 03/08/2017   Procedure: CATARACT EXTRACTION PHACO AND INTRAOCULAR LENS PLACEMENT (IOC) RIGHT DIABETIC TORIC;  Surgeon: Mittie Gaskin, MD;  Location: [facility];  Service: Ophthalmology;  Laterality: Right;  Diabetic - oral  meds   CATARACT EXTRACTION W/PHACO Left 04/17/2017   Procedure: CATARACT EXTRACTION PHACO AND INTRAOCULAR LENS PLACEMENT (IOC) LEFT DIABETIC;  Surgeon: Mittie Gaskin, MD;  Location: [facility];  Service: Ophthalmology;  Laterality: Left;  diabetic-oral meds   CHOLECYSTECTOMY     COLONOSCOPY     COLONOSCOPY N/A 10/05/2022   Procedure: COLONOSCOPY;  Surgeon: Toledo, Ladell POUR, MD;  Location: [facility] ENDOSCOPY;  Service: Gastroenterology;  Laterality: N/A;   COLONOSCOPY WITH PROPOFOL  N/A 08/27/2015   Procedure: COLONOSCOPY WITH PROPOFOL ;  Surgeon: Lamar ONEIDA Holmes, MD;  Location: [facility] ENDOSCOPY;  Service: Endoscopy;  Laterality: N/A;   COLONOSCOPY WITH PROPOFOL  N/A 05/02/2016   Procedure: COLONOSCOPY WITH PROPOFOL ;  Surgeon: Lamar ONEIDA Holmes, MD;  Location: [facility] ENDOSCOPY;  Service: Endoscopy;  Laterality: N/A;   ESOPHAGOGASTRODUODENOSCOPY     ESOPHAGOGASTRODUODENOSCOPY N/A 10/05/2022   Procedure: ESOPHAGOGASTRODUODENOSCOPY (EGD);  Surgeon: Toledo, Ladell POUR, MD;  Location: [facility] ENDOSCOPY;  Service: Gastroenterology;  Laterality: N/A;   ESOPHAGOGASTRODUODENOSCOPY (EGD) WITH PROPOFOL  N/A 08/27/2015   Procedure: ESOPHAGOGASTRODUODENOSCOPY (EGD) WITH PROPOFOL ;  Surgeon: Lamar ONEIDA Holmes, MD;  Location: [facility] ENDOSCOPY;  Service: Endoscopy;  Laterality: N/A;   EYE SURGERY     HERNIA REPAIR     IR BONE MARROW BIOPSY & ASPIRATION  10/28/2022   IR KYPHO LUMBAR INC FX REDUCE BONE BX UNI/BIL CANNULATION INC/IMAGING  12/12/2023   IR KYPHO THORACIC WITH BONE BIOPSY  03/19/2024   IR KYPHO THORACIC WITH BONE BIOPSY  04/16/2024   IR RADIOLOGIST EVAL & MGMT  12/05/2023   IR RADIOLOGIST EVAL & MGMT  12/14/2023   IR RADIOLOGIST EVAL & MGMT  01/02/2024   IR RADIOLOGIST EVAL & MGMT  03/14/2024   IR RADIOLOGIST EVAL & MGMT  04/11/2024   MICROLARYNGOSCOPY Right 11/11/2022   Procedure: MICROLARYNGOSCOPY WITH BIOPSY OF PYRIFORM SINUS MASS;  Surgeon: Herminio Miu, MD;  Location: [facility];  Service: ENT;  Laterality: Right;  Diabetic    SOCIAL HISTORY: Social History   Socioeconomic History   Marital status: Widowed    Spouse name: Not on file   Number of children: Not on file   Years of  education: Not on file   Highest education level: Not on file  Occupational History   Not on file  Tobacco Use   Smoking status: Former    Current packs/day: 0.00    Types: Cigarettes    Quit date: 37    Years since quitting: 65.9   Smokeless tobacco: Never  Vaping Use   Vaping status: Never Used  Substance and Sexual Activity   Alcohol use: No   Drug use: No   Sexual activity: Not Currently  Other Topics Concern   Not on file  Social History Narrative   Not on file   Social Drivers of Health   Financial Resource Strain: Low Risk  (04/25/2024)   Received from [facility]   Overall Financial Resource Strain (CARDIA)    Difficulty of Paying Living Expenses: Not hard at all  Food Insecurity: No Food Insecurity (04/25/2024)   Received from [facility]   Hunger Vital Sign    Within the past 12 months, you worried that your food would run out before you got the money to buy more.: Never true    Within the past 12 months, the food you bought just didn't last and you didn't have money to get more.: Never true  Transportation Needs: No Transportation Needs (04/25/2024)   Received from [facility]   PRAPARE - Transportation    In the past 12 months, has lack of transportation kept you from medical appointments or from getting medications?: No    Lack of Transportation (Non-Medical): No  Physical Activity: Not on file  Stress: Not on file  Social Connections: Not on file  Intimate Partner Violence: Not At Risk (05/24/2022)   Humiliation, Afraid, Rape, and Kick questionnaire    Fear of Current or Ex-Partner: No    Emotionally Abused: No    Physically Abused: No    Sexually Abused: No    FAMILY HISTORY: Family History  Problem Relation Age of Onset   Hypertension Mother    Ovarian cancer Paternal Grandmother    COPD Father    Hyperlipidemia Father    Kidney disease Father    Diabetes Maternal Grandmother    Breast  cancer Neg Hx     ALLERGIES:  is allergic to biaxin [clarithromycin], brompheniramine maleate, ceclor [cefaclor], ciprofloxacin, clinoril [sulindac], codeine sulfate, diovan [valsartan], doxycycline, erythromycin, esomeprazole, fluoxetine, guaifenesin & derivatives, levaquin [levofloxacin in d5w], lisinopril, lodine [etodolac], macrodantin [nitrofurantoin macrocrystal], medrol  [methylprednisolone ], mobic [meloxicam], motrin [ibuprofen], nexium [esomeprazole magnesium ], nitrofurantoin, norvasc [amlodipine besylate], omnicef [cefdinir], penicillins, prednisone , prozac [fluoxetine hcl], pseudoephedrine, reglan [metoclopramide], seldane [terfenadine], sulfa antibiotics, toprol xl [metoprolol tartrate], triamterene, tussionex pennkinetic er [hydrocod poli-chlorphe poli er], vibramycin [doxycycline calcium], and amlodipine.  MEDICATIONS:  Current Outpatient Medications  Medication Sig Dispense Refill   Ascorbic Acid (VITAMIN C) 1000 MG tablet Take 1,000 mg by mouth 2 (two) times daily.     cholecalciferol  (VITAMIN D3) 25 MCG (1000 UT) tablet Take 1,000 Units by mouth 2 (two) times daily.     cyanocobalamin  (VITAMIN B12) 1000 MCG tablet Take 1,000 mcg by mouth daily.     fluticasone (FLONASE) 50 MCG/ACT nasal spray Place 2 sprays into both nostrils daily as needed for allergies.     gemfibrozil  (LOPID ) 600 MG tablet Take 600 mg by mouth 2 (two) times daily before a meal.     glipiZIDE (GLUCOTROL XL) 5 MG 24 hr tablet Take 10 mg by mouth in the morning and at bedtime.     hydrochlorothiazide  (HYDRODIURIL ) 25 MG tablet Take 25 mg by mouth daily.     iron  polysaccharides (NIFEREX) 150 MG capsule Take 150 mg by mouth daily.     losartan (COZAAR) 50 MG tablet Take 50 mg by mouth daily.     metFORMIN (GLUCOPHAGE-XR) 500 MG 24 hr tablet Take 1,000 mg by mouth 2 (two) times daily.     Multiple Vitamins-Minerals (HAIR SKIN & NAILS PO) Take by mouth daily.     Multiple Vitamins-Minerals (MULTIVITAMIN WITH  MINERALS) tablet Take 1 tablet by mouth daily.     Omega 3 1000 MG CAPS Take 1,000 mg by mouth daily.     pantoprazole  (PROTONIX ) 40 MG tablet Take 40 mg by mouth daily.     Probiotic Product (PROBIOTIC PO) Take by mouth daily.     vitamin E 180 MG (400 UNITS) capsule Take 400 Units by mouth daily.     No current facility-administered medications for this visit.    PHYSICAL EXAMINATION: ECOG PERFORMANCE STATUS: 0 - Asymptomatic  Vitals:   05/07/24 0930 05/07/24 0935  BP: (!) 193/86 (!) 192/72  Pulse: 76   Resp: 20   Temp: (!) 96.6 F (35.9 C)   There were no vitals filed for this visit.  Physical Exam Constitutional:      Appearance: She is not ill-appearing.  Comments: Patient is alone.  Ambulating dependently.  HENT:     Head: Normocephalic and atraumatic.     Mouth/Throat:     Pharynx: No oropharyngeal exudate.  Eyes:     Extraocular Movements: Extraocular movements intact.     Pupils: Pupils are equal, round, and reactive to light.  Cardiovascular:     Rate and Rhythm: Normal rate and regular rhythm.  Pulmonary:     Effort: Pulmonary effort is normal. No respiratory distress.     Breath sounds: Normal breath sounds. No wheezing.  Abdominal:     General: There is no distension.     Palpations: Abdomen is soft.     Tenderness: There is no abdominal tenderness. There is no guarding.  Musculoskeletal:        General: No tenderness.     Cervical back: Normal range of motion and neck supple.  Skin:    General: Skin is warm.     Coloration: Skin is not pale.  Neurological:     Mental Status: She is alert and oriented to person, place, and time.  Psychiatric:        Mood and Affect: Mood and affect normal.        Behavior: Behavior normal.    LABORATORY DATA:  I have reviewed the data as listed Lab Results  Component Value Date   WBC 5.5 04/24/2024   HGB 10.1 (L) 05/07/2024   HCT 31.0 (L) 05/07/2024   MCV 89.3 04/24/2024   PLT 163 04/24/2024   Recent  Labs    09/06/23 1404 01/05/24 1514 04/24/24 1325  NA 138 138 136  K 4.3 4.4 4.3  CL 106 106 103  CO2 23 21* 22  GLUCOSE 182* 194* 168*  BUN 33* 41* 39*  CREATININE 1.04* 1.19* 1.18*  CALCIUM 8.8* 9.3 10.0  GFRNONAA 52* 45* 45*    RADIOGRAPHIC STUDIES: I have personally reviewed the radiological images as listed and agreed with the findings in the report. CT ABDOMEN PELVIS W CONTRAST Result Date: 04/19/2024 EXAM: CT ABDOMEN AND PELVIS WITH CONTRAST 04/17/2024 02:29:31 PM TECHNIQUE: CT of the abdomen and pelvis was performed with the administration of 80 mL of iohexol  (OMNIPAQUE ) 300 MG/ML solution. Multiplanar reformatted images are provided for review. Automated exposure control, iterative reconstruction, and/or weight-based adjustment of the mA/kV was utilized to reduce the radiation dose to as low as reasonably achievable. COMPARISON: CT Abdomen Pelvis with and without contrast 06/29/2023. CLINICAL HISTORY: FINDINGS: LOWER CHEST: No acute abnormality. LIVER: The liver is unremarkable. GALLBLADDER AND BILE DUCTS: Status post cholecystectomy. No biliary ductal dilatation. SPLEEN: No acute abnormality. PANCREAS: No acute abnormality. ADRENAL GLANDS: No acute abnormality. KIDNEYS, URETERS AND BLADDER: Focal simple cortical cysts are seen within the left kidney for which no follow up imaging is recommended. Cortical hypodensities within the right kidney are too small to accurately characterize but likely represent additional simple cortical cysts. No follow up imaging recommended. Kidneys are otherwise unremarkable. No stones in the kidneys or ureters. No hydronephrosis. No perinephric or periureteral stranding. Urinary bladder is unremarkable. GI AND BOWEL: Surgical changes of hiatal hernia repair with rim calcified annular surgical device seen at the gastroesophageal junction. Severe descending and sigmoid colonic diverticulosis without superimposed acute inflammatory change. Small fat-containing  bilateral inguinal hernias with mild protrusion of the proximal sigmoid colon through the left hernia defect. The stomach, small bowel, and large bowel are otherwise unremarkable. There is no bowel obstruction. PERITONEUM AND RETROPERITONEUM: No ascites. No free air. Mild pelvic floor  relaxation with mild prolapse of the middle and posterior compartments. VASCULATURE: Aorta is normal in caliber. No aortic aneurysm. Extensive atherosclerotic calcifications within the infrarenal abdominal aorta. Extensive calcifications at the renal artery origins bilaterally. The degree of stenosis is not well assessed on this noncontrast examination. Nutcracker aortic left renal vein. Peripheral vascular disease. If there is clinical evidence of hemodynamically significant renal artery stenosis, chronic mesenteric ischemia, or lower extremity claudication, CT arteriography may be more helpful for further evaluation. LYMPH NODES: No lymphadenopathy. REPRODUCTIVE ORGANS: Status post hysterectomy. Residual ovaries are unremarkable. No adnexal masses. BONES AND SOFT TISSUES: Osseous structures are age appropriate. No acute bone abnormality. No lytic or blastic bone lesion. Interval vertebroplasty of T11, T10, and L5. No acute bony abnormality. No focal soft tissue abnormality. IMPRESSION: 1. No acute findings. 2. Severe descending and sigmoid colonic diverticulosis without superimposed acute inflammatory change 3. Small fat-containing bilateral inguinal hernias with mild protrusion of the proximal sigmoid colon through the left hernia defect 4. Peripheral vascular disease. Extensive atherosclerotic calcifications within the infrarenal abdominal aorta and at the renal artery origins bilaterally. If there is clinical evidence of hemodynamically significant renal artery stenosis, CT arteriography may be more helpful for further evaluation Electronically signed by: Dorethia Molt MD 04/19/2024 10:59 PM EST RP Workstation: [id number]   IR  KYPHO THORACIC WITH BONE BIOPSY Result Date: 04/16/2024 CLINICAL DATA:  89 year old female with a new highly symptomatic pathologic fracture of the T10 vertebral body. She has failed conservative management and therefore presents for cement augmentation with balloon kyphoplasty. EXAM: FLUOROSCOPIC GUIDED KYPHOPLASTY OF THE  VERTEBRAL BODY COMPARISON:  MRI thoracic spine 04/04/2024 MEDICATIONS: As antibiotic prophylaxis, 1 g vancomycin  was ordered pre-procedure and administered intravenously by the Radiology nurse within 1 hour of incision. Additionally, 1 g Tylenol  was administered intravenously by the Radiology nurse following the procedure. ANESTHESIA/SEDATION: Moderate (conscious) sedation was employed during this procedure. A total of Versed  3 mg and Fentanyl  150 mcg was administered intravenously by the Radiology nurse. Moderate Sedation Time: 20 minutes. The patient's level of consciousness and vital signs were monitored continuously by radiology nursing throughout the procedure under my direct supervision. FLUOROSCOPY TIME:  Radiation exposure index: 34.7 mGy, air kerma COMPLICATIONS: None immediate. PROCEDURE: The procedure, risks (including but not limited to bleeding, infection, organ damage), benefits, and alternatives were explained to the patient. Questions regarding the procedure were encouraged and answered. The patient understands and consents to the procedure. The patient was placed prone on the fluoroscopic table. The skin overlying the upper thoracic region was then prepped and draped in the usual sterile fashion. Maximal barrier sterile technique was utilized including caps, mask, sterile gowns, sterile gloves, sterile drape, hand hygiene and skin antiseptic. Intravenous Fentanyl  and Versed  were administered as conscious sedation during continuous cardiorespiratory monitoring by the radiology RN. The right pedicle at T10 was then infiltrated with 1% lidocaine  followed by the advancement of a  Kyphon trocar needle through the left pedicle into the posterior one-third of the vertebral body. Subsequently, the osteo drill was advanced to the anterior third of the vertebral body. The osteo drill was retracted. Through the working cannula, a Kyphon inflatable bone tamp 15 x 2.5 was advanced and positioned with the distal marker approximately 5 mm from the anterior aspect of the cortex. Appropriate positioning was confirmed on the AP projection. At this time, the balloon was expanded using contrast via a Kyphon inflation syringe device via micro tubing. Inflations were continued until there was near apposition with the superior end  plate. At this time, methylmethacrylate mixture was reconstituted in the Kyphon bone mixing device system. This was then loaded into the delivery mechanism, attached to Kyphon bone fillers. The balloons were deflated and removed followed by the instillation of methylmethacrylate mixture with excellent filling in the AP and lateral projections. No extravasation was noted in the disk spaces or posteriorly into the spinal canal. No epidural venous contamination was seen. The working cannulae and the bone filler were then retrieved and removed. Hemostasis was achieved with manual compression. The patient tolerated the procedure well without immediate postprocedural complication. IMPRESSION: 1. Technically successful T10 vertebral body augmentation using balloon kyphoplasty. Electronically Signed   By: Wilkie Lent M.D.   On: 04/16/2024 11:27   IR Radiologist Eval & Mgmt Result Date: 04/11/2024 EXAM: ESTABLISHED PATIENT OFFICE VISIT CHIEF COMPLAINT: SEE NOTE IN EPIC HISTORY OF PRESENT ILLNESS: SEE NOTE IN EPIC REVIEW OF SYSTEMS: SEE NOTE IN EPIC PHYSICAL EXAMINATION: SEE NOTE IN EPIC ASSESSMENT AND PLAN: SEE NOTE IN EPIC Electronically Signed   By: Wilkie Lent M.D.   On: 04/11/2024 08:53    ASSESSMENT & PLAN:   Hypertension- managed by PCP/Dr Hande.  She was seen by  Eva Crimes on 05/01/2024.  Blood pressures have been ranging with systolic greater than 200.  At rest, systolic blood pressure around 185.  Losartan was increased to 50 mg daily, continues hydrochlorothiazide .  Given ongoing uncontrolled blood pressure I recommended that she reach back out to her PCP for reevaluation.  Fortunately, she is asymptomatic but she remains at high risk of complications of her blood pressure.  She also mentions that she was previously referred to vascular for workup of her hypertension.  I urged her to follow through with this. Chronic mild anemia-baseline hemoglobin 10-11.  December 2024 iron  saturation 16%, ferritin 68.  Ferritin from PCP down to 29 on 11/7.  Thought to be secondary to anemia of chronic kidney disease stage III.  Recommendation has been to optimize her iron  stores prior to proceeding with EPO.  We discussed that Venofer  can worsen blood pressure and it has been held.  Fortunately, today her hemoglobin is 10.1 which is stable.  I feel that the potential risk of proceeding with Venofer  outweighs the benefit and it is reasonable to put Venofer  on hold until her blood pressure can be stabilized.  She is in agreement.  Disposition: Reach out to PCP for ongoing uncontrolled hypertension 5-6 weeks- lab (cbc, ferritin, iron  studies, cmp), Dr Rennie, +/- venofer  - la   No problem-specific Assessment & Plan notes found for this encounter.  Tinnie KANDICE Dawn, NP 05/07/2024   CC: Eva Crimes, PA

## 2024-05-08 ENCOUNTER — Encounter: Payer: Self-pay | Admitting: Internal Medicine

## 2024-05-08 NOTE — Progress Notes (Signed)
 HPI The patient is a pleasant 89 year old female who presents today for follow-up of acute sacral pain, recurrent left buttock, groin, and anterior lateral thigh pain consistent with left hip pain, and left buttock and posterior thigh pain consistent with a lumbar radiculitis after suffering a fall on 10/07/2022.  X-rays of the sacrum and coccyx were negative for fracture although consideration could be made for an MRI to detect sacral insufficiency fracture.  On 12/30/2022 she was evaluated with Dr. Kathlynn for severe left hip degenerative joint disease.  She was worked in for a left sacroiliac joint injection under ultrasound with Dr. Sharrie.  She reports moderate relief following the sacroiliac joint injection.   She had a bone marrow biopsy pending (pancytopenia).    MRI dated 12/31/2022 demonstrates: At L2-3 there is been slight progression of central stenosis (now mild to moderate). At L3-4 there is moderate central stenosis.  She is being followed by Dr. Herminio and Dr. Maree for hearing loss. She is also been dealing with left-sided neck pain originating at the C3 level and radiating down to the C6 level.  This is being treated by Dr. Malvina.She was followed by Dr. Herminio and noted to have left-sided neck pain with numbness behind the left ear.  She subsequently developed some radiating pain to the left shoulder and brachial region.  MRI of the cervical spine was obtained and demonstrated at C3-4 a moderately large right and left paracentral disc extrusion associated with moderate central stenosis.  In addition she had severe left-sided facet arthrosis at C2-3 greater than C3-4.  She was referred to Dr. Katrina.  On 10/07/2022 she was sitting in a chair when the chair broke and she landed on her tailbone.   On 10/11/2022 she saw Dr. Clois.  At that time she had improvement of her left shoulder and brachial region pain.  He felt that her symptoms were primarily related to shoulder issues.  He  started her in physical therapy.  She reports further improvement of her neck pain and left shoulder pain.  The symptoms have been more manageable.  She has previously been followed for acute bilateral low back pain with radiation the the bilateral buttock, posterior thigh and calf.  Her pain began in early July 2022 She states she cleaned her house and noted a small amount of pain then had severe pain the next day without injury.  She describes her pain as moderate to severe that is throbbing, stiff and constant.  She feels that she has an associated feeling of weakness that is unchanged since onset.  Standing, lifting, squatting and stairs increases her pain.  Rest along with lying in bed alleviates her pain.  Heat and ice provide mild to moderate benefit.  Chiropractic manipulation by Dr. Malvina provided little benifit.   Medications have included Tylenol  (mild benefit patient avoids most medications as she is very sensitive to them).  She has a history of pancytopenia and thrombocytopenia with last platelet count of 167 on 11/16/23.  She has an extensive allergy list.  She lives alone her husband passed away approximately 5 years ago.  On 04/01/2022 CT of the abdomen/pelvis with contrast was obtained and was essentially unremarkable.  On 04/07/2022 she underwent a left hip joint injection and presents today in follow-up of that procedure.  She has seen Dr. Malvina with mild benefit.   03/29/2023 left lower extremity ultrasound was negative for DVT.  02/27/2023 left lower extremity ultrasound was negative for DVT.  MRI dated 12/01/2023 demonstrated  L5 superior endplate vertebral compression fracture with 30 to 40% vertebral body height loss with marrow edema amongst other findings.  On 12/12/2023 she underwent L5 kyphoplasty.  On 12/21/2023 she saw Dr. Clois.  Consideration was made for physical therapy but this should be after she has further healing after her kyphoplasty.  On 02/20/2024 she had a  sinus infection that turned into bronchitis and she was coughing hard.  After 1 particular cough she felt a pop in her back associate with severe onset of thoracolumbar junction pain.  She had mild radiation along the left lower thoracic region.   She was last evaluated on 04/26/2024 at which time she had improvement of midline lower thoracic pain following kyphoplasty of T10 compression fracture on 04/16/2024.  She had right lateral rib cage pain.  X-rays demonstrated possible nondisplaced right T6 rib fracture.  Today she reports that her right rib cage pain has improved but she continues to have right flank and abdominal discomfort.  She has a vascular consultation pending.  She put out some Christmas decorations and had worsening of her back and left buttock pain.  She has palpable left gluteal muscular spasms.  She is hopeful to have a trigger point injection.  She states she is taking Tylenol  1000 mg 3 times a day.     The [place]  Narcotic Database was reviewed today.   Procedures: 04/16/2024: Kyphoplasty of T10 vertebral body (moderate to good relief) 03/29/2024: Left T11-12 transforaminal ESI (minimal relief) 03/19/2024: Kyphoplasty of the T11 vertebral body 02/08/2024: Left L4-5 and left S1 transforaminal ESI (moderate to good relief) 12/12/2023: Kyphoplasty of the L5 vertebral body 11/24/2023: Left sacroiliac joint injection (with moderate relief) 10/05/2023: Left lateral gluteal trigger point injection (moderate to good relief) 09/13/2023: Left hip joint injection (minimal relief) 08/29/2023: Left L3-4 and left L4-5 transforaminal ESI (mild relief x 1 to 2 days) 06/27/2023: Left ultrasound-guided hip injection by Dr.Kubinski  02/27/2023: Left L3-4 and L4-5 transforaminal ESI (50% relief) 12/30/2022: Left sacroiliac joint injection under ultrasound (moderate relief, Dr. Sharrie) 10/21/2022: Left hip joint injection (less effective) 05/19/2022: Right gluteal trigger  point injection (questionable postinjection flare, then good relief?) 04/07/2022: Left hip joint injection (good relief) 03/04/2022: Left L5-S1 and left L4-5 transforaminal ESI (no relief) 01/07/2022: Bilateral S1 transforaminal ESI (complete relief of her right leg pain pain now on her left low back and anterior and lateral thigh and shin) 12/24/2021: Left U/S guided SI joint injection by Dr. Sharrie (no relief)  Past Medical History:  Diagnosis Date  . Abdominal pain, RUQ (right upper quadrant) 07/30/2015  . Allergic state   . Change in bowel habits 01/22/2019  . Depression   . Diabetes mellitus type 2, uncomplicated (CMS/HHS-HCC)   . Fibrocystic breast disease   . Fibromyalgia   . Gastroesophageal reflux disease without esophagitis 07/30/2015  . GERD (gastroesophageal reflux disease)   . History of bone density study 11/25/2010  . History of skin cancer    Arm, Neck and left side of face.  SABRA History of urinary tract infection   . Hx of adenomatous colonic polyps 01/22/2019  . Hypertension   . IBS (irritable bowel syndrome)   . Migraine   . Nausea 07/30/2015  . Osteoarthritis   . Pancytopenia (CMS-HCC) 08/23/2016  . Thrombocytopenia () 06/29/2017    Past Surgical History:  Procedure Laterality Date  . COLONOSCOPY  11/03/2009   Fundic Gland Polyp: [facility] 10/2014; Recall Ltr mailed 07/31/2014 (dw)  . EGD  11/03/2009   No repeat  per RTE  . COLONOSCOPY  08/27/2015   Tubulovillous Adenoma w/High-Grade Dysplasia: [facility]BF 02/2016; Recall Ltr mailed 12/21/2015 (dw)  . EGD  08/27/2015   No repeat per RTE  . COLONOSCOPY  05/02/2016   Adenomatous Polyps: No repeat due to age per RTE (dw)  . CATARACT EXTRACTION W/ INTRAOCULAR LENS IMPLANT & ANTERIOR VITRECTOMY, BILATERAL Bilateral 04/2017  . Colon @ [facility]  10/05/2022   Tubular adenomas/No repeat/TKT  . EGD @ [facility]  10/05/2022   Normal EGD biopsy/No repeat/TKT  . APPENDECTOMY    . CHOLECYSTECTOMY    . ENDOSCOPY OF BILIARY DUCT    . HERNIA REPAIR     . HYSTERECTOMY    . Skin cancer removal     Left arm, neck x2, and left side of face.    Social History   Socioeconomic History  . Marital status: Widowed  . Number of children: 4  . Years of education: 12+  Occupational History  . 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 Smoking status: Former    Current packs/day: 0.00    Types: Cigarettes    Quit date: 11/15/1956    Years since quitting: 67.5  . Smokeless tobacco: Never  Vaping Use  . Vaping status: Never Used  Substance and Sexual Activity  . Alcohol use: No    Alcohol/week: 0.0 standard drinks of alcohol  . Drug use: No  . Sexual activity: Defer    Partners: Male   Social Drivers of Health   Financial Resource Strain: Low Risk  (04/25/2024)   Overall Financial Resource Strain (CARDIA)   . Difficulty of Paying Living Expenses: Not hard at all  Food Insecurity: No Food Insecurity (04/25/2024)   Hunger Vital Sign   . Worried About Programme Researcher, Broadcasting/film/video in the Last Year: Never true   . Ran Out of Food in the Last Year: Never true  Transportation Needs: No Transportation Needs (04/25/2024)   PRAPARE - Transportation   . Lack of Transportation (Medical): No   . Lack of Transportation (Non-Medical): No    Family History  Problem Relation Name Age of Onset  . Pneumonia Mother    . Alzheimer's disease Mother    . Diabetes Mother    . Diabetes Father    . Emphysema Father    . High blood pressure (Hypertension) Father    . Asthma Father    . Allergic rhinitis Father    . Cancer Sister  59       lung cancer  . Ovarian cancer Paternal Grandmother      Current Outpatient Medications on File Prior to Visit  Medication Sig Dispense Refill  . albuterol MDI, PROVENTIL, VENTOLIN, PROAIR, HFA 90 mcg/actuation inhaler Inhale 2 inhalations into the lungs every 6 (six) hours as needed for Wheezing 1 each 2  . ascorbic acid, vitamin C, (VITAMIN C) 1000 MG tablet Take 1,000 mg by mouth once daily    .  calcium carbonate 500 mg calcium (1,250 mg) tablet Take 1 tablet (1,250 mg of salt total) by mouth 2 (two) times daily with meals 60 tablet 0  . cholecalciferol  (VITAMIN D3) 1000 unit tablet Take 1 tablet by mouth once daily    . clidinium-chlordiazePOXIDE (LIBRAX) 5-2.5 mg capsule Take 1 capsule by mouth 2 (two) times daily as needed 30 capsule 5  . CRANBERRY EXTRACT (CRANBERRY ORAL) Take 2 tablets by mouth 2 (two) times daily    . CYANOCOBALAMIN , VITAMIN B-12, (VITAMIN B-12 ORAL) Take  2,000 mcg by mouth once daily      . dicyclomine (BENTYL) 10 mg capsule 2 capsules    . fluticasone propionate (FLONASE) 50 mcg/actuation nasal spray Place 1 spray into both nostrils 2 (two) times daily 1 g 5  . gemfibroziL  (LOPID ) 600 mg tablet Take 1 tablet (600 mg total) by mouth 2 (two) times daily before meals for 180 days 180 tablet 1  . glipiZIDE (GLUCOTROL) 10 MG tablet Take 1 tablet (10 mg total) by mouth 2 (two) times daily before meals 180 tablet 1  . hydroCHLOROthiazide  (HYDRODIURIL ) 25 MG tablet Take 1 tablet by mouth once daily 90 tablet 1  . IFEREX 150 150 mg iron  capsule Take 1 capsule by mouth once daily 90 capsule 1  . insulin  GLARGINE (LANTUS U-100 INSULIN ) injection (concentration 100 units/mL) 20 units qd 10 mL 3  . Lactobacillus acidophilus 10 billion cell Cap Take 1 capsule by mouth once daily.    . lidocaine  4 % Gel Apply 1 Application topically 3 (three) times a day 120 mL 1  . losartan (COZAAR) 50 MG tablet Take 1 tablet (50 mg total) by mouth once daily 90 tablet 1  . metFORMIN (GLUCOPHAGE-XR) 500 MG XR tablet Take 2 tablets by mouth twice daily 360 tablet 0  . MULTIVITAMIN W-MINERALS/LUTEIN (CENTRUM SILVER ORAL) Take 1 caplet by mouth once daily    . omega-3 fatty acids-vitamin E 1,000 mg Take 1 capsule by mouth 2 (two) times daily.      . pantoprazole  (PROTONIX ) 40 MG DR tablet 1 tablet Orally Once a day; Duration: 30 day(s)    . SITagliptin phosphate (JANUVIA) 50 MG tablet Take 1  tablet (50 mg total) by mouth once daily 30 tablet 2  . blood glucose diagnostic (TRUE METRIX GLUCOSE TEST STRIP) test strip 3 (three) times daily for 180 days Use as instructed. 300 strip 1  . blood glucose meter kit 3 (three) times daily 1 each 0  . lancets (TRUEPLUS LANCETS) 3 (three) times daily for 180 days Use as instructed. 300 each 1  . lancets 33 gauge Misc Use 1 each 3 (three) times daily 300 each 3   No current facility-administered medications on file prior to visit.    Allergies as of 05/08/2024 - Reviewed 05/08/2024  Allergen Reaction Noted  . Dyrenium [triamterene] Headache 08/26/2015  . Fluoxetine hcl Other (See Comments) 08/26/2015  . Omnicef [cefdinir] Hives 08/26/2015  . Dextromethorphan-guaifenesin Other (See Comments) 02/13/2024  . Metoclopramide Other (See Comments) 02/13/2024  . Biaxin [clarithromycin] Other (See Comments) 08/26/2015  . Brompheniramine maleate Other (See Comments) 05/14/2015  . Ceclor [cefaclor] Other (See Comments) 08/26/2015  . Cipro [ciprofloxacin] Nausea 08/26/2015  . Clinoril [sulindac] Cough 08/26/2015  . Decongestant [pseudoephedrine hcl] Other (See Comments)   . Diovan [valsartan] Headache 08/26/2015  . Doxycycline calcium Other (See Comments) 05/14/2015  . Erythromycin (bulk) Other (See Comments)   . Esomeprazole sodium Headache 05/14/2015  . Guaifenesin Other (See Comments) 05/14/2015  . Levaquin [levofloxacin] Itching and Muscle Pain 11/04/2013  . Lisinopril Cough 08/26/2015  . Lodine [etodolac] Other (See Comments) 08/26/2015  . Macrodantin [nitrofurantoin macrocrystalline] Nausea 10/25/2013  . Medrol  [methylprednisolone ] Headache 08/26/2015  . Mobic [meloxicam] Headache 08/26/2015  . Motrin [ibuprofen] Other (See Comments) 08/26/2015  . Nexium [esomeprazole magnesium ] Headache   . Norvasc [amlodipine] Other (See Comments) 08/26/2015  . Penicillins Itching 08/26/2015  . Prozac [fluoxetine] Other (See Comments) 08/26/2015  .  Pseudoephedrine Other (See Comments) 08/26/2015  . Sulfa (sulfonamide antibiotics) Rash   .  Toprol xl [metoprolol succinate] Headache 08/26/2015  . Tussi-organidin nr [codeine-guaifenesin] Nausea   . Zoloft [sertraline] Nausea 09/21/2015   ROS More than 10 system, review of system form was given to the patient to fill out and has been signed by Dr. Avanell and scanned into the patient's chart.   Vital signs Vitals:   05/08/24 1420  BP: (!) 182/87  Pulse: 93  Temp: 36.6 C (97.9 F)  TempSrc: Oral  Weight: 63.5 kg (140 lb)  Height: 170.2 cm (5' 7)  PainSc:   7  PainLoc: Back    Exam General:  Alert oriented well nourished no distress   Lumbosacral Exam (performed 10/13/2022) On inspection no rash is noted.  She has mild tenderness to palpation to the lumbosacral paraspinal musculature.  She has severe diffuse tenderness over the sacrum and tailbone.  Extension rotation is not performed due to fall risk.  Lower Extremity Exam She has 5/5 strength in bilateral dorsiflexors, knee extensors, 4/5 strength in bilateral hip flexors.  Sensation intact to light touch bilaterally.  She has absent patellar and Achilles reflexes.  She does not have ankle clonus.  Range of motion of the left hip produces left buttock and anterior lateral thigh pain.  Range of motion of the right hip does not produce pain.  Straight leg raise is negative bilaterally.   Radiographic Data   Impression 1.  Acute on chronic right flank pain of unclear etiology.  2.  Acute thoracolumbar junction pain after coughing hard on 02/20/2024.  Pain radiates into the left flank region.  Clinically she has symptoms suspicious for a lower thoracic radiculitis.  MRI dated 03/07/2024 demonstrates an acute T11 compression fracture.  Unfortunately kyphoplasty on 03/19/2024 has been of minimal benefit.  X-rays obtained today demonstrate a subtle possible T10 compression fracture.  X-rays of the thoracic spine, 2 views dated  03/27/2024 were reviewed. Patient status post cement augmentation T11.  Wedge compression deformity appreciate involving T10 with vertebral body height loss of 10 to 15% age-indeterminate.  No further evidence of acute fracture no dislocation.  Mild multilevel disc base narrowing is appreciated.  Kyphotic curvature of the thoracic spine is maintained.  Soft tissues unremarkable.  Lap-Band is demonstrated.   IMPRESSION:  Bridge deformity involving T10 age-indeterminate.  No further evidence of acute fracture or dislocation.  Mild degenerative disc disease changes. FREDDERICK LEMOND FELL, MD   X-rays of the left rib cage dated 03/27/2024 were reviewed. No consolidation, infiltrate, effusion or edema. Cardiac silhouette unremarkable Ribs: Osseous structures and soft tissues are unremarkable.    IMPRESSION:  Chest: No acute cardiopulmonary disease. Ribs: No evidence of fractureno dislocation.  HECTOR LEMOND FELL, MD  MRI of the thoracic spine without contrast was reviewed. MRI THORACIC SPINE WITHOUT INTRAVENOUS CONTRAST  03/07/2024 08:32:00 PM  TECHNIQUE:  Multiplanar multisequence MRI of the thoracic spine was performed without the  administration of intravenous contrast.  COMPARISON:  03/29/2012.  CLINICAL HISTORY:  Suspected compression fracture.  FINDINGS:  BONES AND ALIGNMENT:  Normal alignment. Wedge compression fracture of T11 with less than 25% height  loss. Diffuse bone marrow edema at T11. Small osteonecrotic cleft beneath the  superior endplate of T11 suggests a subacute timeline. Vertebral body heights  are otherwise normal. Bone marrow signal is otherwise unremarkable. No abnormal  enhancement.   SPINAL CORD:  Normal spinal cord volume. Normal spinal cord signal.   SOFT TISSUES:  Unremarkable.   DEGENERATIVE CHANGES:  No significant disc herniation. No spinal canal stenosis or neural foraminal  narrowing.   IMPRESSION:  1. Early subacute wedge compression  fracture of T11 with less than 25% height  loss, diffuse bone marrow edema.   Electronically signed by: Franky Stanford MD 03/07/2024 09:09 PM EDT   3.  Acute on chronic low back pain with radiation into the left lateral hip, anterior lateral thigh, and anterior lateral lower leg.  Clinically she has symptoms consistent with a lumbar radiculitis.  MRI lumbar spine with and without contrast from [facility] dated 02/29/2024 images and report were reviewed today.  EXAM:  MRI LUMBAR SPINE WITHOUT AND WITH CONTRAST   TECHNIQUE:  Multiplanar and multiecho pulse sequences of the lumbar spine were  obtained without and with intravenous contrast.   CONTRAST:  6mL GADAVIST  GADOBUTROL  1 MMOL/ML IV SOLN  COMPARISON:  Lumbar MRI 12/01/2023 and earlier. CT Abdomen and  Pelvis 06/29/2023.  FINDINGS:  Segmentation: Normal on the comparison CT, the same numbering system  used on the June MRI.  Alignment: Lumbar lordosis appears stable since June. Subtle  multilevel anterolisthesis L2-L3 through L5-S1. No significant  scoliosis.  Vertebrae: Normal background bone marrow signal. Chronic mild L3 and  L4 compression fractures. Previously augmented L5 compression  fracture.  Patchy L3 vertebral body and superior endplate STIR signal  heterogeneity to the right of midline. Endplate compression there  appears stable from previous exams. And stir signal changes also  stable. Intact visible sacrum and SI joints. Maintained lower  thoracic and L1 vertebral height. Minimal L2 inferior endplate  compression is chronic and stable. No acute osseous abnormality  identified.  Conus medullaris and cauda equina: Conus extends to the L1-L2 level.  No lower spinal cord or conus signal abnormality. No abnormal  intradural enhancement. No dural thickening.  Paraspinal and other soft tissues: Stable and negative visible  abdominal viscera. No acute paraspinal soft tissue finding.  Disc levels:  Stable multifactorial  degenerative lumbar spinal stenosis: L2-L3: Moderate (series 8, image 13) with asymmetric moderate left  lateral recess stenosis at the descending left L3 nerve level.  L3-L4: Severe (series 8, image 18) with asymmetric moderate to  severe right lateral recess stenosis at the descending right L4  nerve level).  L4-L5: Mild (series 8, image 22) with asymmetric mild to moderate  right lateral recess stenosis at the descending right L5 nerve  level). Moderate to severe right L4 neural foraminal stenosis.  L5-S1: No significant spinal stenosis, mild bilateral lateral recess  stenosis (S1 nerve levels) and severe bilateral L5 neural foraminal  stenosis.  IMPRESSION:  1. No acute osseous abnormality. Chronic L2 through L5 compression  fractures, previously augmented at L5.  2. L2-L3 through L4-L5 multifactorial degenerative spinal stenosis  is stable since June;  Severe at L3-L4, with associated lateral recess stenosis maximal at  that level.  And chronic neural foraminal stenosis which is Severe at the  bilateral L5 nerve levels, moderate to severe at the right L4 nerve  level.  Electronically Signed    By: VEAR Hurst M.D.    On: 03/03/2024 09:55   4.  Left buttock, groin, and anterior lateral thigh pain.  Clinically she has symptoms consistent with degenerative joint disease of the hip. MR OF THE LEFT HIP WITHOUT CONTRAST  TECHNIQUE:  Multiplanar, multisequence MR imaging was performed. No intravenous  cotrast was administered.  COMPARISON:  CT abdomen and pelvis 05/23/2022  FINDINGS:  Bones: Mild pubic symphysis joint space narrowing and peripheral  osteophytosis. Severe right L4-5 and moderate to severe right L5-S1  disc space  narrowing and peripheral endplate osteophytes.  Articular cartilage and labrum   Left hip:  Cartilage: There is focal high-grade subchondral marrow edema within  the superior, mid to anterior left femoral head (coronal series 12,  image 10 and sagittal  series 11, image 14). Minimal overlying  cortical flattening. Moderate to high-grade superior left femoral  head and acetabular cartilage thinning. Mild-to-moderate femoral  head-neck junction and peripheral acetabular degenerative  osteophytes.  Labrum: Mild attenuation of the superior left glenoid labrum with  high-grade attenuation the and degenerative irregularity of the  anterosuperior left acetabular labrum.  On large field-of-view images, there is might high-grade thinning of  the superior right femoroacetabular cartilage. Mild right femoral  head-neck junction circumferential degenerative osteophytes, less  severe than the left side.  Joint or bursal effusion  Joint effusion:  No joint effusion within either hip.  Bursae: Mild fluid within the left trochanteric bursa.  Muscles and tendons  Muscles and tendons: Origins of the lateral rectus femoris tendons  are intact. Mild-to-moderate fluid within the midsubstance of the  bilateral common hamstring tendon origins, mild-to-moderate  tenosynovitis. No tendon retraction. Partial-thickness tear of the  posterior aspect of the left gluteus minimus tendon insertion. The  right gluteus minimus and bilateral gluteus medius tendon insertions  are intact.  Other findings  Miscellaneous: The rectus abdominis-adductor aponeuroses are intact.  Moderate to high-grade sigmoid diverticulosis without inflammatory  changes to indicate acute diverticulitis. The uterus is surgically  absent.   IMPRESSION:  1. Focal high-grade subchondral marrow edema within the superior,  mid to anterior left femoral head with minimal overlying cortical  flattening. This is compatible with an acute to subacute subchondral  insufficiency fracture.  2. Moderate to high-grade left femoroacetabular cartilage thinning.  3. Partial-thickness tear of the posterior aspect of the left  gluteus minimus tendon insertion.  4. Mild left trochanteric bursitis.  5.  Mild-to-moderate bilateral common hamstring tendon origin  tenosynovitis.  6. Severe right L4-5 and moderate to severe right L5-S1 degenerative  disc and endplate changes.   Electronically Signed    By: Tanda Lyons M.D.    On: 05/02/2023 19:53    5.  Acute on chronic left-sided neck pain with radiation to the left shoulder and brachial region.  Clinically she has some symptoms consistent with a cervical radiculitis (C3-4 disc extrusion) and some symptoms consistent with shoulder pathology.  She also appears to have an element of left-sided OA neck pain related to severe facet arthropathy of the left C2-3 and C3-4 facet joints.MRI of the cervical spine from [place] dated 09/16/2022 both images and report were reviewed today. COMPARISON:  Cervical spine radiographs 12/07/2021. Neck CT 11/04/2021. FINDINGS: Alignment: Straightening of the normal cervical lordosis. Trace anterolisthesis of C2 on C3, C3 on C4, C4 on C5, and C7 on T1 and trace retrolisthesis retrolisthesis of C5 on C6, similar to the prior CT. Vertebrae: No fracture, suspicious marrow lesion, or significant marrow edema. Chronic degenerative endplate changes at C5-6. Cord: Normal signal. Posterior Fossa, vertebral arteries, paraspinal tissues: Unremarkable. Disc levels: C2-3: Minimal disc bulging, minimal uncovertebral spurring, and severe left facet arthrosis result in mild left neural foraminal stenosis without spinal stenosis. C3-4: A moderately large left paracentral disc extrusion results in moderate spinal stenosis with left ventral cord flattening. Moderate facet arthrosis. Patent neural foramina. C4-5: Small left central disc protrusion and mild right and moderate left facet arthrosis without significant stenosis. C5-6: Severe disc space narrowing. A broad-based posterior disc osteophyte complex and mild  facet arthrosis result in mild spinal stenosis and mild-to-moderate bilateral neural foraminal  stenosis. C6-7: Mild disc bulging, a small left paracentral disc protrusion, and mild facet arthrosis result in mild left neural foraminal stenosis without spinal stenosis. C7-T1: Disc uncovering and mild facet arthrosis without significant stenosis. IMPRESSION: 1. Moderately large left paracentral disc extrusion at C3-4 with moderate spinal stenosis. 2. Mild spinal stenosis and mild-to-moderate neural foraminal stenosis at C5-6. 3. Advanced upper cervical facet arthrosis. Electronically Signed   By: Dasie Hamburg M.D.   On: 09/20/2022 16:12  6.   Acute bilateral low back pain with radiation the the bilateral buttock, posterior thigh and calf. Clinically her pain is most consistent with a lumbosacral radiculitis possibly related to her spinal narrowing at L3-4.  Lumbar spine xray from [facility] clinic dated 12/29/2021 imaging reviewed degenerative changes.   7.  Left sacroiliitis. 8.  Type 2 diabetes.  9.  Thrombocytopenia last platelet count was 124 on 09/06/2023. 10.  Extensive allergy list.   Plan 1.  Continue Tylenol  500 mg 1 to 2 tablets 3 times daily as needed.. 2.  Continue gabapentin  100 mg nightly as prescribed by neurology. 3.  She may continue tizanidine 2 mg as needed muscle spasms. 4.  Over-the-counter lidocaine  patch. 5.  After verbal consent a left middle gluteal trigger point injection was performed.  Timeout was performed.  The skin was prepped with Betadine and cleansed with alcohol.  Ethyl chloride was applied.  Then using a 25-gauge needle 1.5 cc of Kenalog  40 mg/cc mixed with 1.5 cc of 2% lidocaine  was slowly infiltrated after repeated aspiration.  The patient tolerated the procedure well without complications. 6.  She has a vascular consultation pending. 7.  She will follow-up with me in 7 weeks.    BENJAMIN CHARLES CHASNIS, DO  This note was generated in part with voice recognition software and I apologize for any typographical errors that were not detected  and corrected.  PCP: Dr. Vish Hande

## 2024-05-13 ENCOUNTER — Encounter (INDEPENDENT_AMBULATORY_CARE_PROVIDER_SITE_OTHER): Admitting: Vascular Surgery

## 2024-05-13 ENCOUNTER — Encounter: Payer: Self-pay | Admitting: Internal Medicine

## 2024-05-23 ENCOUNTER — Ambulatory Visit (INDEPENDENT_AMBULATORY_CARE_PROVIDER_SITE_OTHER): Admitting: Vascular Surgery

## 2024-05-23 ENCOUNTER — Encounter (INDEPENDENT_AMBULATORY_CARE_PROVIDER_SITE_OTHER): Payer: Self-pay | Admitting: Vascular Surgery

## 2024-05-23 VITALS — BP 194/84 | HR 78 | Resp 18 | Ht 66.0 in | Wt 142.2 lb

## 2024-05-23 DIAGNOSIS — E119 Type 2 diabetes mellitus without complications: Secondary | ICD-10-CM | POA: Diagnosis not present

## 2024-05-23 DIAGNOSIS — E785 Hyperlipidemia, unspecified: Secondary | ICD-10-CM

## 2024-05-23 DIAGNOSIS — I150 Renovascular hypertension: Secondary | ICD-10-CM | POA: Insufficient documentation

## 2024-05-23 DIAGNOSIS — I701 Atherosclerosis of renal artery: Secondary | ICD-10-CM | POA: Diagnosis not present

## 2024-05-23 NOTE — Progress Notes (Signed)
 MRN : [id number]  Kim Ballard is a 89 y.o. (15-Oct-1934) female who presents with chief complaint of check circulation.  History of Present Illness:   The patient is seen for evaluation of malignant hypertension which has been very difficult to control. The patient has a long history of hypertension which recently has become increasingly difficult to control utilizing medical therapy. The patient has consistently documented systolic blood pressures near 799 with diastolic pressures over 90.   The patient does have family history of hypertension.   There is no prior documented abdominal bruit. The patient occasionally has flushing symptoms but denies palpitations. No episodes of syncope. There is no history of headache. There is no history of flash pulmonary edema.  The patient denies a history of renal disease.  No recent shortening of the patient's walking distance or new symptoms consistent with claudication.  No history of rest pain symptoms. No new ulcers or wounds of the lower extremities have occurred.  The patient denies amaurosis fugax or recent TIA symptoms. There are no recent neurological changes noted. There is no history of DVT, PE or superficial thrombophlebitis. No recent episodes of angina or shortness of breath documented.   CT scan of the abdomen pelvis with contrast dated 04/17/2024 demonstrates bilateral greater than 80% stenosis of the renal arteries.  There is diffuse atherosclerosis of the distal aorta and iliac arteries noted as well.  No lower extremity hemodynamically significant stenosis identified.  Active Medications[1]  Past Medical History:  Diagnosis Date   Allergic state    Anemia    Arthritis    Cancer (HCC)    skin cancers   Chronic cystitis with hematuria    Complication of anesthesia 10/28/2022   Excessive night sweats and itching after procedure 10/28/22   Depression     Diabetes mellitus without complication (HCC)    Fibrocystic disease of both breasts    Fibromyalgia    GERD (gastroesophageal reflux disease)    Hypertension    IBS (irritable bowel syndrome)    Melanoma (HCC) 07/09/2021   lt neck/shoulder area   Melanoma of neck (HCC) 09/06/2021   left   Nausea    Skin cancer    Wears dentures    full upper and lower    Past Surgical History:  Procedure Laterality Date   ABDOMINAL HYSTERECTOMY     APPENDECTOMY     BREAST BIOPSY Right 2001   surgical bx    CATARACT EXTRACTION W/PHACO Right 03/08/2017   Procedure: CATARACT EXTRACTION PHACO AND INTRAOCULAR LENS PLACEMENT (IOC) RIGHT DIABETIC TORIC;  Surgeon: Mittie Gaskin, MD;  Location: [facility];  Service: Ophthalmology;  Laterality: Right;  Diabetic - oral meds   CATARACT EXTRACTION W/PHACO Left 04/17/2017   Procedure: CATARACT EXTRACTION PHACO AND INTRAOCULAR LENS PLACEMENT (IOC) LEFT DIABETIC;  Surgeon: Mittie Gaskin, MD;  Location: [facility];  Service: Ophthalmology;  Laterality: Left;  diabetic-oral meds   CHOLECYSTECTOMY     COLONOSCOPY     COLONOSCOPY N/A 10/05/2022   Procedure: COLONOSCOPY;  Surgeon: Toledo, Ladell POUR, MD;  Location: [facility] ENDOSCOPY;  Service: Gastroenterology;  Laterality: N/A;   COLONOSCOPY WITH PROPOFOL  N/A 08/27/2015   Procedure: COLONOSCOPY WITH PROPOFOL ;  Surgeon: Lamar ONEIDA Holmes, MD;  Location: [facility] ENDOSCOPY;  Service: Endoscopy;  Laterality: N/A;   COLONOSCOPY WITH PROPOFOL  N/A 05/02/2016   Procedure: COLONOSCOPY WITH PROPOFOL ;  Surgeon: Lamar ONEIDA Holmes, MD;  Location: [facility] ENDOSCOPY;  Service: Endoscopy;  Laterality: N/A;   ESOPHAGOGASTRODUODENOSCOPY     ESOPHAGOGASTRODUODENOSCOPY N/A 10/05/2022   Procedure: ESOPHAGOGASTRODUODENOSCOPY (EGD);  Surgeon: Toledo, Ladell POUR, MD;  Location: [facility] ENDOSCOPY;  Service: Gastroenterology;  Laterality: N/A;   ESOPHAGOGASTRODUODENOSCOPY (EGD) WITH PROPOFOL  N/A 08/27/2015   Procedure:  ESOPHAGOGASTRODUODENOSCOPY (EGD) WITH PROPOFOL ;  Surgeon: Lamar ONEIDA Holmes, MD;  Location: [facility] ENDOSCOPY;  Service: Endoscopy;  Laterality: N/A;   EYE SURGERY     HERNIA REPAIR     IR BONE MARROW BIOPSY & ASPIRATION  10/28/2022   IR KYPHO LUMBAR INC FX REDUCE BONE BX UNI/BIL CANNULATION INC/IMAGING  12/12/2023   IR KYPHO THORACIC WITH BONE BIOPSY  03/19/2024   IR KYPHO THORACIC WITH BONE BIOPSY  04/16/2024   IR RADIOLOGIST EVAL & MGMT  12/05/2023   IR RADIOLOGIST EVAL & MGMT  12/14/2023   IR RADIOLOGIST EVAL & MGMT  01/02/2024   IR RADIOLOGIST EVAL & MGMT  03/14/2024   IR RADIOLOGIST EVAL & MGMT  04/11/2024   MICROLARYNGOSCOPY Right 11/11/2022   Procedure: MICROLARYNGOSCOPY WITH BIOPSY OF PYRIFORM SINUS MASS;  Surgeon: Herminio Miu, MD;  Location: [facility];  Service: ENT;  Laterality: Right;  Diabetic    Social History Social History[2]  Family History Family History  Problem Relation Age of Onset   Hypertension Mother    Ovarian cancer Paternal Grandmother    COPD Father    Hyperlipidemia Father    Kidney disease Father    Diabetes Maternal Grandmother    Breast cancer Neg Hx     Allergies[3]   REVIEW OF SYSTEMS (Negative unless checked)  Constitutional: [] Weight loss  [] Fever  [] Chills Cardiac: [] Chest pain   [] Chest pressure   [] Palpitations   [] Shortness of breath when laying flat   [] Shortness of breath with exertion. Vascular:  [x] Pain in legs with walking   [] Pain in legs at rest  [] History of DVT   [] Phlebitis   [] Swelling in legs   [] Varicose veins   [] Non-healing ulcers Pulmonary:   [] Uses home oxygen   [] Productive cough   [] Hemoptysis   [] Wheeze  [] COPD   [] Asthma Neurologic:  [] Dizziness   [] Seizures   [] History of stroke   [] History of TIA  [] Aphasia   [] Vissual changes   [] Weakness or numbness in arm   [] Weakness or numbness in leg Musculoskeletal:   [] Joint swelling   [] Joint pain   [] Low back pain Hematologic:  [] Easy bruising  [] Easy bleeding    [] Hypercoagulable state   [] Anemic Gastrointestinal:  [] Diarrhea   [] Vomiting  [] Gastroesophageal reflux/heartburn   [] Difficulty swallowing. Genitourinary:  [] Chronic kidney disease   [] Difficult urination  [] Frequent urination   [] Blood in urine Skin:  [] Rashes   [] Ulcers  Psychological:  [] History of anxiety   []  History of major depression.  Physical Examination  There were no vitals filed for this visit. There is no height or weight on file to calculate BMI. Gen: WD/WN, NAD Head: [place]/AT, No temporalis wasting.  Ear/Nose/Throat: Hearing grossly intact, nares w/o erythema or drainage Eyes: PER, EOMI, sclera nonicteric.  Neck: Supple, no masses.  No bruit or JVD.  Pulmonary:  Good air movement, no audible wheezing, no use of accessory  muscles.  Cardiac: RRR, normal S1, S2, no Murmurs. Vascular:  mild trophic changes, no open wounds Vessel Right Left  Radial Palpable Palpable  PT Not Palpable Not Palpable  DP Not Palpable Not Palpable  Gastrointestinal: soft, non-distended. No guarding/no peritoneal signs.  Musculoskeletal: M/S 5/5 throughout.  No visible deformity.  Neurologic: CN 2-12 intact. Pain and light touch intact in extremities.  Symmetrical.  Speech is fluent. Motor exam as listed above. Psychiatric: Judgment intact, Mood & affect appropriate for pt's clinical situation. Dermatologic: No rashes or ulcers noted.  No changes consistent with cellulitis.   CBC Lab Results  Component Value Date   WBC 5.5 04/24/2024   HGB 10.1 (L) 05/07/2024   HCT 31.0 (L) 05/07/2024   MCV 89.3 04/24/2024   PLT 163 04/24/2024    BMET    Component Value Date/Time   NA 136 04/24/2024 1325   K 4.3 04/24/2024 1325   CL 103 04/24/2024 1325   CO2 22 04/24/2024 1325   GLUCOSE 168 (H) 04/24/2024 1325   BUN 39 (H) 04/24/2024 1325   CREATININE 1.18 (H) 04/24/2024 1325   CALCIUM 10.0 04/24/2024 1325   GFRNONAA 45 (L) 04/24/2024 1325   GFRAA >60 02/14/2020 1318   CrCl cannot be calculated  (Patient's most recent lab result is older than the maximum 21 days allowed.).  COAG No results found for: INR, PROTIME  Radiology No results found.   Assessment/Plan 1. Renovascular hypertension (Primary) Recommend:  The patient has evidence of severe atherosclerotic changes of the renal artery with worsening of the atherosclerosis of the renal arteries associated with severe hypertension.  This represents a high risk for CVA, MI and renal failure.  Patient should undergo angiography of the bilateral renal artery with the hope for intervention for control of hypertension.  The risks and benefits as well as the alternative therapies was discussed in detail with the patient.  All questions were answered.  Patient agrees to proceed with angiography.   The patietn will follow up in the office after the angiogram.  2. Renal artery stenosis Recommend:  The patient has evidence of severe atherosclerotic changes of the renal artery with worsening of the atherosclerosis of the renal arteries associated with severe hypertension.  This represents a high risk for CVA, MI and renal failure.  Patient should undergo angiography of the bilateral renal artery with the hope for intervention for control of hypertension.  The risks and benefits as well as the alternative therapies was discussed in detail with the patient.  All questions were answered.  Patient agrees to proceed with angiography.   The patietn will follow up in the office after the angiogram.  3. Diabetes mellitus without complication (HCC) Continue hypoglycemic medications as already ordered, these medications have been reviewed and there are no changes at this time.  Hgb A1C to be monitored as already arranged by primary service  4. Hyperlipidemia, unspecified hyperlipidemia type Continue statin as ordered and reviewed, no changes at this time    Cordella Shawl, MD  05/23/2024 12:52 PM      [1]  No outpatient  medications have been marked as taking for the 05/23/24 encounter (Appointment) with Shawl, Cordella MATSU, MD.  [2]  Social History Tobacco Use   Smoking status: Former    Current packs/day: 0.00    Types: Cigarettes    Quit date: 1960    Years since quitting: 66.0   Smokeless tobacco: Never  Vaping Use   Vaping status: Never Used  Substance Use  Topics   Alcohol use: No   Drug use: No  [3]  Allergies Allergen Reactions   Biaxin [Clarithromycin] Other (See Comments)    GI UPSET    Brompheniramine Maleate    Ceclor [Cefaclor]    Ciprofloxacin    Clinoril [Sulindac]    Codeine Sulfate    Diovan [Valsartan]    Doxycycline     Able to tolerate low dose   Erythromycin    Esomeprazole    Fluoxetine Other (See Comments)    UNKNOWN   Guaifenesin & Derivatives    Levaquin [Levofloxacin In D5w]     Can take in low doses   Lisinopril    Lodine [Etodolac]    Macrodantin [Nitrofurantoin Macrocrystal]    Medrol  [Methylprednisolone ]    Mobic [Meloxicam]    Motrin [Ibuprofen]    Nexium [Esomeprazole Magnesium ]    Nitrofurantoin Other (See Comments)   Norvasc [Amlodipine Besylate]    Omnicef [Cefdinir] Other (See Comments)    Developed whelps 10 days laster    Penicillins    Prednisone  Nausea And Vomiting    Severe   Prozac [Fluoxetine Hcl] Other (See Comments)    Makes her feel crazy    Pseudoephedrine    Reglan [Metoclopramide]    Seldane [Terfenadine]    Sulfa Antibiotics Other (See Comments)   Toprol Xl [Metoprolol Tartrate]    Triamterene    Tussionex Pennkinetic Er [Hydrocod Poli-Chlorphe Poli Er]    Vibramycin [Doxycycline Calcium]    Amlodipine Rash    Other reaction(s): Other (See Comments) Edema Edema

## 2024-05-23 NOTE — H&P (View-Only) (Signed)
 MRN : [id number]  Kim Ballard is a 89 y.o. (15-Oct-1934) female who presents with chief complaint of check circulation.  History of Present Illness:   The patient is seen for evaluation of malignant hypertension which has been very difficult to control. The patient has a long history of hypertension which recently has become increasingly difficult to control utilizing medical therapy. The patient has consistently documented systolic blood pressures near 799 with diastolic pressures over 90.   The patient does have family history of hypertension.   There is no prior documented abdominal bruit. The patient occasionally has flushing symptoms but denies palpitations. No episodes of syncope. There is no history of headache. There is no history of flash pulmonary edema.  The patient denies a history of renal disease.  No recent shortening of the patient's walking distance or new symptoms consistent with claudication.  No history of rest pain symptoms. No new ulcers or wounds of the lower extremities have occurred.  The patient denies amaurosis fugax or recent TIA symptoms. There are no recent neurological changes noted. There is no history of DVT, PE or superficial thrombophlebitis. No recent episodes of angina or shortness of breath documented.   CT scan of the abdomen pelvis with contrast dated 04/17/2024 demonstrates bilateral greater than 80% stenosis of the renal arteries.  There is diffuse atherosclerosis of the distal aorta and iliac arteries noted as well.  No lower extremity hemodynamically significant stenosis identified.  Active Medications[1]  Past Medical History:  Diagnosis Date   Allergic state    Anemia    Arthritis    Cancer (HCC)    skin cancers   Chronic cystitis with hematuria    Complication of anesthesia 10/28/2022   Excessive night sweats and itching after procedure 10/28/22   Depression     Diabetes mellitus without complication (HCC)    Fibrocystic disease of both breasts    Fibromyalgia    GERD (gastroesophageal reflux disease)    Hypertension    IBS (irritable bowel syndrome)    Melanoma (HCC) 07/09/2021   lt neck/shoulder area   Melanoma of neck (HCC) 09/06/2021   left   Nausea    Skin cancer    Wears dentures    full upper and lower    Past Surgical History:  Procedure Laterality Date   ABDOMINAL HYSTERECTOMY     APPENDECTOMY     BREAST BIOPSY Right 2001   surgical bx    CATARACT EXTRACTION W/PHACO Right 03/08/2017   Procedure: CATARACT EXTRACTION PHACO AND INTRAOCULAR LENS PLACEMENT (IOC) RIGHT DIABETIC TORIC;  Surgeon: Mittie Gaskin, MD;  Location: [facility];  Service: Ophthalmology;  Laterality: Right;  Diabetic - oral meds   CATARACT EXTRACTION W/PHACO Left 04/17/2017   Procedure: CATARACT EXTRACTION PHACO AND INTRAOCULAR LENS PLACEMENT (IOC) LEFT DIABETIC;  Surgeon: Mittie Gaskin, MD;  Location: [facility];  Service: Ophthalmology;  Laterality: Left;  diabetic-oral meds   CHOLECYSTECTOMY     COLONOSCOPY     COLONOSCOPY N/A 10/05/2022   Procedure: COLONOSCOPY;  Surgeon: Toledo, Ladell POUR, MD;  Location: [facility] ENDOSCOPY;  Service: Gastroenterology;  Laterality: N/A;   COLONOSCOPY WITH PROPOFOL  N/A 08/27/2015   Procedure: COLONOSCOPY WITH PROPOFOL ;  Surgeon: Lamar ONEIDA Holmes, MD;  Location: [facility] ENDOSCOPY;  Service: Endoscopy;  Laterality: N/A;   COLONOSCOPY WITH PROPOFOL  N/A 05/02/2016   Procedure: COLONOSCOPY WITH PROPOFOL ;  Surgeon: Lamar ONEIDA Holmes, MD;  Location: [facility] ENDOSCOPY;  Service: Endoscopy;  Laterality: N/A;   ESOPHAGOGASTRODUODENOSCOPY     ESOPHAGOGASTRODUODENOSCOPY N/A 10/05/2022   Procedure: ESOPHAGOGASTRODUODENOSCOPY (EGD);  Surgeon: Toledo, Ladell POUR, MD;  Location: [facility] ENDOSCOPY;  Service: Gastroenterology;  Laterality: N/A;   ESOPHAGOGASTRODUODENOSCOPY (EGD) WITH PROPOFOL  N/A 08/27/2015   Procedure:  ESOPHAGOGASTRODUODENOSCOPY (EGD) WITH PROPOFOL ;  Surgeon: Lamar ONEIDA Holmes, MD;  Location: [facility] END[facility]COPY;  Service: Endoscopy;  Laterality: N/A;   EYE SURGERY     HERNIA REPAIR     IR BONE MARROW BIOPSY & ASPIRATION  10/28/2022   IR KYPHO LUMBAR INC FX REDUCE BONE BX UNI/BIL CANNULATION INC/IMAGING  12/12/2023   IR KYPHO THORACIC WITH BONE BIOPSY  03/19/2024   IR KYPHO THORACIC WITH BONE BIOPSY  04/16/2024   IR RADIOLOGIST EVAL & MGMT  12/05/2023   IR RADIOLOGIST EVAL & MGMT  12/14/2023   IR RADIOLOGIST EVAL & MGMT  01/02/2024   IR RADIOLOGIST EVAL & MGMT  03/14/2024   IR RADIOLOGIST EVAL & MGMT  04/11/2024   MICROLARYNGOSCOPY Right 11/11/2022   Procedure: MICROLARYNGOSCOPY WITH BIOPSY OF PYRIFORM SINUS MASS;  Surgeon: Herminio Miu, MD;  Location: [facility];  Service: ENT;  Laterality: Right;  Diabetic    Social History Social History[2]  Family History Family History  Problem Relation Age of Onset   Hypertension Mother    Ovarian cancer Paternal Grandmother    COPD Father    Hyperlipidemia Father    Kidney disease Father    Diabetes Maternal Grandmother    Breast cancer Neg Hx     Allergies[3]   REVIEW OF SYSTEMS (Negative unless checked)  Constitutional: [] Weight loss  [] Fever  [] Chills Cardiac: [] Chest pain   [] Chest pressure   [] Palpitations   [] Shortness of breath when laying flat   [] Shortness of breath with exertion. Vascular:  [x] Pain in legs with walking   [] Pain in legs at rest  [] History of DVT   [] Phlebitis   [] Swelling in legs   [] Varicose veins   [] Non-healing ulcers Pulmonary:   [] Uses home oxygen   [] Productive cough   [] Hemoptysis   [] Wheeze  [] COPD   [] Asthma Neurologic:  [] Dizziness   [] Seizures   [] History of stroke   [] History of TIA  [] Aphasia   [] Vissual changes   [] Weakness or numbness in arm   [] Weakness or numbness in leg Musculoskeletal:   [] Joint swelling   [] Joint pain   [] Low back pain Hematologic:  [] Easy bruising  [] Easy bleeding    [] Hypercoagulable state   [] Anemic Gastrointestinal:  [] Diarrhea   [] Vomiting  [] Gastroesophageal reflux/heartburn   [] Difficulty swallowing. Genitourinary:  [] Chronic kidney disease   [] Difficult urination  [] Frequent urination   [] Blood in urine Skin:  [] Rashes   [] Ulcers  Psychological:  [] History of anxiety   []  History of major depression.  Physical Examination  There were no vitals filed for this visit. There is no height or weight on file to calculate BMI. Gen: WD/WN, NAD Head: [place]/AT, No temporalis wasting.  Ear/Nose/Throat: Hearing grossly intact, nares w/o erythema or drainage Eyes: PER, EOMI, sclera nonicteric.  Neck: Supple, no masses.  No bruit or JVD.  Pulmonary:  Good air movement, no audible wheezing, no use of accessory  muscles.  Cardiac: RRR, normal S1, S2, no Murmurs. Vascular:  mild trophic changes, no open wounds Vessel Right Left  Radial Palpable Palpable  PT Not Palpable Not Palpable  DP Not Palpable Not Palpable  Gastrointestinal: soft, non-distended. No guarding/no peritoneal signs.  Musculoskeletal: M/S 5/5 throughout.  No visible deformity.  Neurologic: CN 2-12 intact. Pain and light touch intact in extremities.  Symmetrical.  Speech is fluent. Motor exam as listed above. Psychiatric: Judgment intact, Mood & affect appropriate for pt's clinical situation. Dermatologic: No rashes or ulcers noted.  No changes consistent with cellulitis.   CBC Lab Results  Component Value Date   WBC 5.5 04/24/2024   HGB 10.1 (L) 05/07/2024   HCT 31.0 (L) 05/07/2024   MCV 89.3 04/24/2024   PLT 163 04/24/2024    BMET    Component Value Date/Time   NA 136 04/24/2024 1325   K 4.3 04/24/2024 1325   CL 103 04/24/2024 1325   CO2 22 04/24/2024 1325   GLUCOSE 168 (H) 04/24/2024 1325   BUN 39 (H) 04/24/2024 1325   CREATININE 1.18 (H) 04/24/2024 1325   CALCIUM 10.0 04/24/2024 1325   GFRNONAA 45 (L) 04/24/2024 1325   GFRAA >60 02/14/2020 1318   CrCl cannot be calculated  (Patient's most recent lab result is older than the maximum 21 days allowed.).  COAG No results found for: INR, PROTIME  Radiology No results found.   Assessment/Plan 1. Renovascular hypertension (Primary) Recommend:  The patient has evidence of severe atherosclerotic changes of the renal artery with worsening of the atherosclerosis of the renal arteries associated with severe hypertension.  This represents a high risk for CVA, MI and renal failure.  Patient should undergo angiography of the bilateral renal artery with the hope for intervention for control of hypertension.  The risks and benefits as well as the alternative therapies was discussed in detail with the patient.  All questions were answered.  Patient agrees to proceed with angiography.   The patietn will follow up in the office after the angiogram.  2. Renal artery stenosis Recommend:  The patient has evidence of severe atherosclerotic changes of the renal artery with worsening of the atherosclerosis of the renal arteries associated with severe hypertension.  This represents a high risk for CVA, MI and renal failure.  Patient should undergo angiography of the bilateral renal artery with the hope for intervention for control of hypertension.  The risks and benefits as well as the alternative therapies was discussed in detail with the patient.  All questions were answered.  Patient agrees to proceed with angiography.   The patietn will follow up in the office after the angiogram.  3. Diabetes mellitus without complication (HCC) Continue hypoglycemic medications as already ordered, these medications have been reviewed and there are no changes at this time.  Hgb A1C to be monitored as already arranged by primary service  4. Hyperlipidemia, unspecified hyperlipidemia type Continue statin as ordered and reviewed, no changes at this time    Cordella Shawl, MD  05/23/2024 12:52 PM      [1]  No outpatient  medications have been marked as taking for the 05/23/24 encounter (Appointment) with Shawl, Cordella MATSU, MD.  [2]  Social History Tobacco Use   Smoking status: Former    Current packs/day: 0.00    Types: Cigarettes    Quit date: 1960    Years since quitting: 66.0   Smokeless tobacco: Never  Vaping Use   Vaping status: Never Used  Substance Use  Topics   Alcohol use: No   Drug use: No  [3]  Allergies Allergen Reactions   Biaxin [Clarithromycin] Other (See Comments)    GI UPSET    Brompheniramine Maleate    Ceclor [Cefaclor]    Ciprofloxacin    Clinoril [Sulindac]    Codeine Sulfate    Diovan [Valsartan]    Doxycycline     Able to tolerate low dose   Erythromycin    Esomeprazole    Fluoxetine Other (See Comments)    UNKNOWN   Guaifenesin & Derivatives    Levaquin [Levofloxacin In D5w]     Can take in low doses   Lisinopril    Lodine [Etodolac]    Macrodantin [Nitrofurantoin Macrocrystal]    Medrol  [Methylprednisolone ]    Mobic [Meloxicam]    Motrin [Ibuprofen]    Nexium [Esomeprazole Magnesium ]    Nitrofurantoin Other (See Comments)   Norvasc [Amlodipine Besylate]    Omnicef [Cefdinir] Other (See Comments)    Developed whelps 10 days laster    Penicillins    Prednisone  Nausea And Vomiting    Severe   Prozac [Fluoxetine Hcl] Other (See Comments)    Makes her feel crazy    Pseudoephedrine    Reglan [Metoclopramide]    Seldane [Terfenadine]    Sulfa Antibiotics Other (See Comments)   Toprol Xl [Metoprolol Tartrate]    Triamterene    Tussionex Pennkinetic Er [Hydrocod Poli-Chlorphe Poli Er]    Vibramycin [Doxycycline Calcium]    Amlodipine Rash    Other reaction(s): Other (See Comments) Edema Edema

## 2024-06-03 ENCOUNTER — Telehealth (INDEPENDENT_AMBULATORY_CARE_PROVIDER_SITE_OTHER): Payer: Self-pay

## 2024-06-03 NOTE — Telephone Encounter (Signed)
 Spoke with the patient and she has been scheduled with Dr. Jama for a bilateral renal angio stent placement on 06/18/24 with a 12:00 pm arrival time to the [facility]. Pre-procedure instructions were discussed and will be sent to Mychart and mailed.

## 2024-06-13 ENCOUNTER — Inpatient Hospital Stay (HOSPITAL_BASED_OUTPATIENT_CLINIC_OR_DEPARTMENT_OTHER): Admitting: Nurse Practitioner

## 2024-06-13 ENCOUNTER — Other Ambulatory Visit: Payer: Self-pay | Admitting: *Deleted

## 2024-06-13 ENCOUNTER — Inpatient Hospital Stay: Attending: Internal Medicine

## 2024-06-13 ENCOUNTER — Encounter: Payer: Self-pay | Admitting: Nurse Practitioner

## 2024-06-13 ENCOUNTER — Inpatient Hospital Stay

## 2024-06-13 VITALS — BP 160/82 | HR 74 | Temp 97.0°F | Ht 66.0 in | Wt 142.0 lb

## 2024-06-13 DIAGNOSIS — D509 Iron deficiency anemia, unspecified: Secondary | ICD-10-CM | POA: Diagnosis not present

## 2024-06-13 DIAGNOSIS — Z8041 Family history of malignant neoplasm of ovary: Secondary | ICD-10-CM | POA: Insufficient documentation

## 2024-06-13 DIAGNOSIS — N1831 Chronic kidney disease, stage 3a: Secondary | ICD-10-CM | POA: Diagnosis not present

## 2024-06-13 DIAGNOSIS — Z87891 Personal history of nicotine dependence: Secondary | ICD-10-CM | POA: Insufficient documentation

## 2024-06-13 DIAGNOSIS — Z79899 Other long term (current) drug therapy: Secondary | ICD-10-CM | POA: Insufficient documentation

## 2024-06-13 DIAGNOSIS — R5382 Chronic fatigue, unspecified: Secondary | ICD-10-CM | POA: Diagnosis not present

## 2024-06-13 DIAGNOSIS — D631 Anemia in chronic kidney disease: Secondary | ICD-10-CM | POA: Diagnosis not present

## 2024-06-13 DIAGNOSIS — D649 Anemia, unspecified: Secondary | ICD-10-CM

## 2024-06-13 DIAGNOSIS — I10 Essential (primary) hypertension: Secondary | ICD-10-CM | POA: Diagnosis not present

## 2024-06-13 LAB — IRON AND TIBC
Iron: 54 ug/dL (ref 28–170)
Saturation Ratios: 12 % (ref 10.4–31.8)
TIBC: 454 ug/dL — ABNORMAL HIGH (ref 250–450)
UIBC: 399 ug/dL

## 2024-06-13 LAB — CBC WITH DIFFERENTIAL (CANCER CENTER ONLY)
Abs Immature Granulocytes: 0.03 K/uL (ref 0.00–0.07)
Basophils Absolute: 0 K/uL (ref 0.0–0.1)
Basophils Relative: 1 %
Eosinophils Absolute: 0.2 K/uL (ref 0.0–0.5)
Eosinophils Relative: 5 %
HCT: 30.9 % — ABNORMAL LOW (ref 36.0–46.0)
Hemoglobin: 10.1 g/dL — ABNORMAL LOW (ref 12.0–15.0)
Immature Granulocytes: 1 %
Lymphocytes Relative: 17 %
Lymphs Abs: 0.9 K/uL (ref 0.7–4.0)
MCH: 30.2 pg (ref 26.0–34.0)
MCHC: 32.7 g/dL (ref 30.0–36.0)
MCV: 92.5 fL (ref 80.0–100.0)
Monocytes Absolute: 0.4 K/uL (ref 0.1–1.0)
Monocytes Relative: 8 %
Neutro Abs: 3.5 K/uL (ref 1.7–7.7)
Neutrophils Relative %: 68 %
Platelet Count: 179 K/uL (ref 150–400)
RBC: 3.34 MIL/uL — ABNORMAL LOW (ref 3.87–5.11)
RDW: 14.1 % (ref 11.5–15.5)
WBC Count: 5.1 K/uL (ref 4.0–10.5)
nRBC: 0 % (ref 0.0–0.2)

## 2024-06-13 LAB — FERRITIN: Ferritin: 41 ng/mL (ref 11–307)

## 2024-06-13 LAB — BASIC METABOLIC PANEL - CANCER CENTER ONLY
Anion gap: 12 (ref 5–15)
BUN: 38 mg/dL — ABNORMAL HIGH (ref 8–23)
CO2: 23 mmol/L (ref 22–32)
Calcium: 10.1 mg/dL (ref 8.9–10.3)
Chloride: 106 mmol/L (ref 98–111)
Creatinine: 1.28 mg/dL — ABNORMAL HIGH (ref 0.44–1.00)
GFR, Estimated: 40 mL/min — ABNORMAL LOW
Glucose, Bld: 139 mg/dL — ABNORMAL HIGH (ref 70–99)
Potassium: 4.8 mmol/L (ref 3.5–5.1)
Sodium: 141 mmol/L (ref 135–145)

## 2024-06-13 NOTE — Progress Notes (Signed)
 [facility] CONSULT NOTE  Patient Care Team: Sadie Manna, MD as PCP - General (Internal Medicine) Sadie Manna, MD as Physician Assistant (Internal Medicine) Rennie Cindy SAUNDERS, MD as Consulting Physician (Oncology)  CHIEF COMPLAINTS/PURPOSE OF CONSULTATION: Iron  deficiency anemia/thrombocytopenia  # Chronic [2015] mild anemia hemoglobin 11.1-question iron  deficiency versus others.-Dr. Elliott/Dr. Rudell; s/p Venofer . MAY 2024- BONE MARROW, ASPIRATE, CLOT, CORE:  - Normocellular bone marrow (20%) with trilineage hematopoiesis and no  increase in blasts. Morphologic evaluation and immunohistochemical analysis do not provide a precise explanation for the patient's anemia and thrombocytopenia. The  absence of significant morphologic dysplasia makes a myelodysplastic syndrome less likely and secondary causes should be considered; however, correlation with pending cytogenetic and molecular studies will be of interest to exclude a morphologically subtle evolving myeloid neoplasm.   # Mild thrombocytopenia platelets 120s  # Fatigue/ PN/DM; MELANOMA of mid back [May 2021] s/p excision. MELANOMA LEFT neck posterior-s/p excision. [facility] 2023]  Oncology History   No problem history exists.    HISTORY OF PRESENTING ILLNESS: Alone.  Ambulating independently.  Kim Ballard 89 y.o. female with longstanding history of iron  deficiency anemia and thrombocytopenia with chronic fatigue, depression, and refractory hypertension who presents to clinic for follow up and consideration of venofer . She was seen last month and blood pressure was uncontrolled. She was asymptomatic but recommended she follow up with PCP for medication adjustment. She has been diagnosed with renal artery stenosis with greater than 80% stenosis of renal arteries on CT. She is scheduled to undergo renal angiography with Dr Jama on 06/18/24.  She also suffered a distal radius fracture near the base of the  thumb on 06/05/24. She was seen by orthopedic surgery and no surgical management was recommended; treating with immobilization. She says that she has gotten large bills for her venofer  infusions and is worried about cost of additional infusions.    Review of Systems  Constitutional:  Positive for malaise/fatigue. Negative for chills, fever and weight loss.  Eyes:  Negative for double vision and photophobia.  Respiratory:  Negative for cough and shortness of breath.   Cardiovascular:  Negative for chest pain, palpitations, orthopnea and leg swelling.  Gastrointestinal:  Negative for abdominal pain, blood in stool, heartburn, melena and nausea.  Musculoskeletal:  Positive for back pain, falls and joint pain.       Right thumb pain- known fracture  Neurological:  Negative for dizziness, tingling, focal weakness, weakness and headaches.  Endo/Heme/Allergies:  Does not bruise/bleed easily.  Psychiatric/Behavioral:  Positive for depression. The patient is not nervous/anxious and does not have insomnia.      MEDICAL HISTORY:  Past Medical History:  Diagnosis Date   Allergic state    Anemia    Arthritis    Cancer (HCC)    skin cancers   Chronic cystitis with hematuria    Complication of anesthesia 10/28/2022   Excessive night sweats and itching after procedure 10/28/22   Depression    Diabetes mellitus without complication (HCC)    Fibrocystic disease of both breasts    Fibromyalgia    GERD (gastroesophageal reflux disease)    Hypertension    IBS (irritable bowel syndrome)    Melanoma (HCC) 07/09/2021   lt neck/shoulder area   Melanoma of neck (HCC) 09/06/2021   left   Nausea    Skin cancer    Wears dentures    full upper and lower    SURGICAL HISTORY: Past Surgical History:  Procedure Laterality Date   ABDOMINAL  HYSTERECTOMY     APPENDECTOMY     BREAST BIOPSY Right 2001   surgical bx    CATARACT EXTRACTION W/PHACO Right 03/08/2017   Procedure: CATARACT EXTRACTION PHACO AND  INTRAOCULAR LENS PLACEMENT (IOC) RIGHT DIABETIC TORIC;  Surgeon: Mittie Gaskin, MD;  Location: [facility];  Service: Ophthalmology;  Laterality: Right;  Diabetic - oral meds   CATARACT EXTRACTION W/PHACO Left 04/17/2017   Procedure: CATARACT EXTRACTION PHACO AND INTRAOCULAR LENS PLACEMENT (IOC) LEFT DIABETIC;  Surgeon: Mittie Gaskin, MD;  Location: [facility];  Service: Ophthalmology;  Laterality: Left;  diabetic-oral meds   CHOLECYSTECTOMY     COLONOSCOPY     COLONOSCOPY N/A 10/05/2022   Procedure: COLONOSCOPY;  Surgeon: Toledo, Ladell POUR, MD;  Location: [facility] END[facility]COPY;  Service: Gastroenterology;  Laterality: N/A;   COLONOSCOPY WITH PROPOFOL  N/A 08/27/2015   Procedure: COLONOSCOPY WITH PROPOFOL ;  Surgeon: Lamar ONEIDA Holmes, MD;  Location: [facility] END[facility]COPY;  Service: Endoscopy;  Laterality: N/A;   COLONOSCOPY WITH PROPOFOL  N/A 05/02/2016   Procedure: COLONOSCOPY WITH PROPOFOL ;  Surgeon: Lamar ONEIDA Holmes, MD;  Location: [facility] END[facility]COPY;  Service: Endoscopy;  Laterality: N/A;   ESOPHAGOGASTRODUODENOSCOPY     ESOPHAGOGASTRODUODENOSCOPY N/A 10/05/2022   Procedure: ESOPHAGOGASTRODUODENOSCOPY (EGD);  Surgeon: Toledo, Ladell POUR, MD;  Location: [facility] ENDOSCOPY;  Service: Gastroenterology;  Laterality: N/A;   ESOPHAGOGASTRODUODENOSCOPY (EGD) WITH PROPOFOL  N/A 08/27/2015   Procedure: ESOPHAGOGASTRODUODENOSCOPY (EGD) WITH PROPOFOL ;  Surgeon: Lamar ONEIDA Holmes, MD;  Location: [facility] ENDOSCOPY;  Service: Endoscopy;  Laterality: N/A;   EYE SURGERY     HERNIA REPAIR     IR BONE MARROW BIOPSY & ASPIRATION  10/28/2022   IR KYPHO LUMBAR INC FX REDUCE BONE BX UNI/BIL CANNULATION INC/IMAGING  12/12/2023   IR KYPHO THORACIC WITH BONE BIOPSY  03/19/2024   IR KYPHO THORACIC WITH BONE BIOPSY  04/16/2024   IR RADIOLOGIST EVAL & MGMT  12/05/2023   IR RADIOLOGIST EVAL & MGMT  12/14/2023   IR RADIOLOGIST EVAL & MGMT  01/02/2024   IR RADIOLOGIST EVAL & MGMT  03/14/2024   IR RADIOLOGIST EVAL & MGMT   04/11/2024   MICROLARYNGOSCOPY Right 11/11/2022   Procedure: MICROLARYNGOSCOPY WITH BIOPSY OF PYRIFORM SINUS MASS;  Surgeon: Herminio Miu, MD;  Location: [facility];  Service: ENT;  Laterality: Right;  Diabetic    SOCIAL HISTORY: Social History   Socioeconomic History   Marital status: Widowed    Spouse name: Not on file   Number of children: Not on file   Years of education: Not on file   Highest education level: Not on file  Occupational History   Not on file  Tobacco Use   Smoking status: Former    Current packs/day: 0.00    Types: Cigarettes    Quit date: 73    Years since quitting: 66.0   Smokeless tobacco: Never  Vaping Use   Vaping status: Never Used  Substance and Sexual Activity   Alcohol use: No   Drug use: No   Sexual activity: Not Currently  Other Topics Concern   Not on file  Social History Narrative   Not on file   Social Drivers of Health   Tobacco Use: Medium Risk (06/13/2024)   Patient History    Smoking Tobacco Use: Former    Smokeless Tobacco Use: Never    Passive Exposure: Not on file  Financial Resource Strain: Low Risk  (06/07/2024)   Received from [facility]   Overall Financial Resource Strain (CARDIA)    Difficulty of  Paying Living Expenses: Not hard at all  Food Insecurity: No Food Insecurity (06/07/2024)   Received from [facility]   Epic    Within the past 12 months, you worried that your food would run out before you got the money to buy more.: Never true    Within the past 12 months, the food you bought just didn't last and you didn't have money to get more.: Never true  Transportation Needs: No Transportation Needs (06/07/2024)   Received from [facility] - Transportation    In the past 12 months, has lack of transportation kept you from medical appointments or from getting medications?: No    Lack of Transportation (Non-Medical): No  Physical Activity: Not on file   Stress: Not on file  Social Connections: Not on file  Intimate Partner Violence: Not At Risk (05/24/2022)   Humiliation, Afraid, Rape, and Kick questionnaire    Fear of Current or Ex-Partner: No    Emotionally Abused: No    Physically Abused: No    Sexually Abused: No  Depression (PHQ2-9): Low Risk (06/13/2024)   Depression (PHQ2-9)    PHQ-2 Score: 0  Alcohol Screen: Not on file  Housing: Low Risk  (06/07/2024)   Received from [facility]   Epic    In the last 12 months, was there a time when you were not able to pay the mortgage or rent on time?: No    In the past 12 months, how many times have you moved where you were living?: 0    At any time in the past 12 months, were you homeless or living in a shelter (including now)?: No  Utilities: Not At Risk (06/07/2024)   Received from [facility]   Epic    In the past 12 months has the electric, gas, oil, or water  company threatened to shut off services in your home?: No  Health Literacy: Not on file    FAMILY HISTORY: Family History  Problem Relation Age of Onset   Hypertension Mother    Ovarian cancer Paternal Grandmother    COPD Father    Hyperlipidemia Father    Kidney disease Father    Diabetes Maternal Grandmother    Breast cancer Neg Hx     ALLERGIES:  is allergic to biaxin [clarithromycin], brompheniramine maleate, ceclor [cefaclor], ciprofloxacin, clinoril [sulindac], codeine sulfate, diovan [valsartan], doxycycline, erythromycin, esomeprazole, fluoxetine, guaifenesin & derivatives, levaquin [levofloxacin in d5w], lisinopril, lodine [etodolac], macrodantin [nitrofurantoin macrocrystal], medrol  [methylprednisolone ], mobic [meloxicam], motrin [ibuprofen], nexium [esomeprazole magnesium ], nitrofurantoin, norvasc [amlodipine besylate], omnicef [cefdinir], penicillins, prednisone , prozac [fluoxetine hcl], pseudoephedrine, reglan [metoclopramide], seldane [terfenadine], sulfa antibiotics, toprol xl  [metoprolol tartrate], triamterene, tussionex pennkinetic er [hydrocod poli-chlorphe poli er], vibramycin [doxycycline calcium], and amlodipine.  MEDICATIONS:  Current Outpatient Medications  Medication Sig Dispense Refill   Ascorbic Acid (VITAMIN C) 1000 MG tablet Take 1,000 mg by mouth 2 (two) times daily.     cholecalciferol  (VITAMIN D3) 25 MCG (1000 UT) tablet Take 1,000 Units by mouth 2 (two) times daily.     cyanocobalamin  (VITAMIN B12) 1000 MCG tablet Take 1,000 mcg by mouth daily.     fluticasone (FLONASE) 50 MCG/ACT nasal spray Place 2 sprays into both nostrils daily as needed for allergies.     gemfibrozil  (LOPID ) 600 MG tablet Take 600 mg by mouth 2 (two) times daily before a meal.     glipiZIDE (GLUCOTROL XL) 5 MG 24 hr tablet Take 10  mg by mouth in the morning and at bedtime.     hydrochlorothiazide  (HYDRODIURIL ) 25 MG tablet Take 25 mg by mouth daily.     iron  polysaccharides (NIFEREX) 150 MG capsule Take 150 mg by mouth daily.     losartan (COZAAR) 50 MG tablet Take 50 mg by mouth daily. (Patient taking differently: Take 75 mg by mouth daily.)     metFORMIN (GLUCOPHAGE-XR) 500 MG 24 hr tablet Take 1,000 mg by mouth 2 (two) times daily.     Multiple Vitamins-Minerals (HAIR SKIN & NAILS PO) Take by mouth daily.     Multiple Vitamins-Minerals (MULTIVITAMIN WITH MINERALS) tablet Take 1 tablet by mouth daily.     Omega 3 1000 MG CAPS Take 1,000 mg by mouth daily.     pantoprazole  (PROTONIX ) 40 MG tablet Take 40 mg by mouth daily.     Probiotic Product (PROBIOTIC PO) Take by mouth daily.     vitamin E 180 MG (400 UNITS) capsule Take 400 Units by mouth daily.     No current facility-administered medications for this visit.    PHYSICAL EXAMINATION: ECOG PERFORMANCE STATUS: 0 - Asymptomatic  Vitals:   06/13/24 0926 06/13/24 0949  BP: (!) 189/82 (!) 160/82  Pulse: 74   Temp: (!) 97 F (36.1 C)   SpO2: 100%    Filed Weights   06/13/24 0926  Weight: 142 lb (64.4 kg)     Physical Exam Constitutional:      Appearance: She is not ill-appearing.     Comments: Patient is alone.  Ambulating dependently.  HENT:     Head: Normocephalic and atraumatic.     Mouth/Throat:     Pharynx: No oropharyngeal exudate.  Eyes:     Extraocular Movements: Extraocular movements intact.     Pupils: Pupils are equal, round, and reactive to light.  Cardiovascular:     Rate and Rhythm: Normal rate and regular rhythm.  Pulmonary:     Effort: Pulmonary effort is normal. No respiratory distress.     Breath sounds: Normal breath sounds. No wheezing.  Abdominal:     General: There is no distension.     Palpations: Abdomen is soft.     Tenderness: There is no abdominal tenderness. There is no guarding.  Musculoskeletal:        General: No tenderness.     Cervical back: Normal range of motion and neck supple.  Skin:    General: Skin is warm.     Coloration: Skin is not pale.  Neurological:     Mental Status: She is alert and oriented to person, place, and time.  Psychiatric:        Mood and Affect: Mood and affect normal.        Behavior: Behavior normal.    LABORATORY DATA:  I have reviewed the data as listed Lab Results  Component Value Date   WBC 5.1 06/13/2024   HGB 10.1 (L) 06/13/2024   HCT 30.9 (L) 06/13/2024   MCV 92.5 06/13/2024   PLT 179 06/13/2024   Recent Labs    01/05/24 1514 04/24/24 1325 06/13/24 0927  NA 138 136 141  K 4.4 4.3 4.8  CL 106 103 106  CO2 21* 22 23  GLUCOSE 194* 168* 139*  BUN 41* 39* 38*  CREATININE 1.19* 1.18* 1.28*  CALCIUM 9.3 10.0 10.1  GFRNONAA 45* 45* 40*   Iron /TIBC/Ferritin/ %Sat    Component Value Date/Time   IRON  70 04/24/2024 1325   TIBC 463 (H) 04/24/2024  1325   FERRITIN 46 04/24/2024 1325   IRONPCTSAT 15 04/24/2024 1325    RADIOGRAPHIC STUDIES: I have personally reviewed the radiological images as listed and agreed with the findings in the report. No results found.  ASSESSMENT & PLAN:    Hypertension- managed by PCP/Dr Erroll Crimes, PA. Diagnosed with bilateral renal artery stenosis > 80% on CT. BP uncontrolled despite medication. Awaiting renal angiography with Dr Jama next week. BP improved today.   Chronic mild anemia- baseline hemoglobin 10-11. Anemia thought to be secondary to chronic kidney disease, stage III. Recommendation to optimize iron  stores prior to considering EPO. Nov 2025 hmg 9.8, ferritin 46, iron  sat 15% with elevated TIBC. Overall decreased. Venofer  has been held due to uncontrolled hypertension. Hmg today is 10.1 which is somewhat higher than previously. Ferritin and iron  stores are pending. Patient wishes to defer venofer  infusion today due to concern for costs. She requests to push out future appointments to 2 months from now which is reasonable given stability of her hemoglobin.   Disposition: No venofer  today 2 mo- lab (cbc, ferritin, iron  studies), Dr Rennie, +/- venofer - [place]  No problem-specific Assessment & Plan notes found for this encounter.  Tinnie KANDICE Dawn, NP 06/13/2024   CC: Eva Crimes, PA

## 2024-06-14 ENCOUNTER — Telehealth (INDEPENDENT_AMBULATORY_CARE_PROVIDER_SITE_OTHER): Payer: Self-pay

## 2024-06-14 NOTE — Telephone Encounter (Signed)
 Patient called and read to me the paperwork and asking if the paperwork was correct. I explained that the pre-procedure instructions listed are correct. Patient also stated that she is allergic to quite a bit of medications and when she had back surgery they gave her Vancomycin . I stated she has to arrive an hour early for prep and to make sure she inform staff.

## 2024-06-18 ENCOUNTER — Other Ambulatory Visit: Payer: Self-pay

## 2024-06-18 ENCOUNTER — Ambulatory Visit

## 2024-06-18 ENCOUNTER — Inpatient Hospital Stay
Admission: AD | Admit: 2024-06-18 | Discharge: 2024-06-23 | DRG: 673 | Disposition: A | Discharge status: Home Health | Source: Ambulatory Visit | Attending: Vascular Surgery | Admitting: Vascular Surgery

## 2024-06-18 ENCOUNTER — Encounter
Admission: AD | Disposition: A | Discharge status: Home Health | Payer: Self-pay | Source: Ambulatory Visit | Attending: Vascular Surgery

## 2024-06-18 ENCOUNTER — Encounter: Payer: Self-pay | Admitting: Vascular Surgery

## 2024-06-18 DIAGNOSIS — K589 Irritable bowel syndrome without diarrhea: Secondary | ICD-10-CM | POA: Diagnosis present

## 2024-06-18 DIAGNOSIS — R944 Abnormal results of kidney function studies: Secondary | ICD-10-CM | POA: Diagnosis not present

## 2024-06-18 DIAGNOSIS — Z880 Allergy status to penicillin: Secondary | ICD-10-CM

## 2024-06-18 DIAGNOSIS — K219 Gastro-esophageal reflux disease without esophagitis: Secondary | ICD-10-CM | POA: Diagnosis present

## 2024-06-18 DIAGNOSIS — I129 Hypertensive chronic kidney disease with stage 1 through stage 4 chronic kidney disease, or unspecified chronic kidney disease: Secondary | ICD-10-CM

## 2024-06-18 DIAGNOSIS — Z95828 Presence of other vascular implants and grafts: Secondary | ICD-10-CM | POA: Diagnosis not present

## 2024-06-18 DIAGNOSIS — Z833 Family history of diabetes mellitus: Secondary | ICD-10-CM | POA: Diagnosis not present

## 2024-06-18 DIAGNOSIS — Z85828 Personal history of other malignant neoplasm of skin: Secondary | ICD-10-CM | POA: Diagnosis not present

## 2024-06-18 DIAGNOSIS — Z8419 Family history of other disorders of kidney and ureter: Secondary | ICD-10-CM

## 2024-06-18 DIAGNOSIS — E1165 Type 2 diabetes mellitus with hyperglycemia: Secondary | ICD-10-CM | POA: Diagnosis present

## 2024-06-18 DIAGNOSIS — M797 Fibromyalgia: Secondary | ICD-10-CM | POA: Diagnosis present

## 2024-06-18 DIAGNOSIS — N179 Acute kidney failure, unspecified: Secondary | ICD-10-CM | POA: Diagnosis not present

## 2024-06-18 DIAGNOSIS — Z8249 Family history of ischemic heart disease and other diseases of the circulatory system: Secondary | ICD-10-CM

## 2024-06-18 DIAGNOSIS — E872 Acidosis, unspecified: Secondary | ICD-10-CM | POA: Diagnosis present

## 2024-06-18 DIAGNOSIS — Z7984 Long term (current) use of oral hypoglycemic drugs: Secondary | ICD-10-CM | POA: Diagnosis not present

## 2024-06-18 DIAGNOSIS — R578 Other shock: Secondary | ICD-10-CM | POA: Diagnosis not present

## 2024-06-18 DIAGNOSIS — R571 Hypovolemic shock: Secondary | ICD-10-CM | POA: Diagnosis present

## 2024-06-18 DIAGNOSIS — Z882 Allergy status to sulfonamides status: Secondary | ICD-10-CM

## 2024-06-18 DIAGNOSIS — Z9889 Other specified postprocedural states: Secondary | ICD-10-CM

## 2024-06-18 DIAGNOSIS — I701 Atherosclerosis of renal artery: Principal | ICD-10-CM | POA: Diagnosis present

## 2024-06-18 DIAGNOSIS — E861 Hypovolemia: Secondary | ICD-10-CM | POA: Diagnosis not present

## 2024-06-18 DIAGNOSIS — I871 Compression of vein: Secondary | ICD-10-CM

## 2024-06-18 DIAGNOSIS — Z8349 Family history of other endocrine, nutritional and metabolic diseases: Secondary | ICD-10-CM

## 2024-06-18 DIAGNOSIS — Z825 Family history of asthma and other chronic lower respiratory diseases: Secondary | ICD-10-CM

## 2024-06-18 DIAGNOSIS — I150 Renovascular hypertension: Secondary | ICD-10-CM | POA: Diagnosis present

## 2024-06-18 DIAGNOSIS — Z886 Allergy status to analgesic agent status: Secondary | ICD-10-CM

## 2024-06-18 DIAGNOSIS — Z888 Allergy status to other drugs, medicaments and biological substances status: Secondary | ICD-10-CM

## 2024-06-18 DIAGNOSIS — E1122 Type 2 diabetes mellitus with diabetic chronic kidney disease: Secondary | ICD-10-CM | POA: Diagnosis present

## 2024-06-18 DIAGNOSIS — E8721 Acute metabolic acidosis: Secondary | ICD-10-CM | POA: Diagnosis not present

## 2024-06-18 DIAGNOSIS — Z8041 Family history of malignant neoplasm of ovary: Secondary | ICD-10-CM

## 2024-06-18 DIAGNOSIS — K5900 Constipation, unspecified: Secondary | ICD-10-CM | POA: Diagnosis not present

## 2024-06-18 DIAGNOSIS — N99840 Postprocedural hematoma of a genitourinary system organ or structure following a genitourinary system procedure: Secondary | ICD-10-CM | POA: Diagnosis not present

## 2024-06-18 DIAGNOSIS — S37019A Minor contusion of unspecified kidney, initial encounter: Secondary | ICD-10-CM | POA: Diagnosis not present

## 2024-06-18 DIAGNOSIS — D72829 Elevated white blood cell count, unspecified: Secondary | ICD-10-CM | POA: Diagnosis not present

## 2024-06-18 DIAGNOSIS — J9601 Acute respiratory failure with hypoxia: Secondary | ICD-10-CM | POA: Diagnosis not present

## 2024-06-18 DIAGNOSIS — R109 Unspecified abdominal pain: Secondary | ICD-10-CM | POA: Diagnosis not present

## 2024-06-18 DIAGNOSIS — D62 Acute posthemorrhagic anemia: Secondary | ICD-10-CM | POA: Diagnosis not present

## 2024-06-18 DIAGNOSIS — Z85820 Personal history of malignant melanoma of skin: Secondary | ICD-10-CM

## 2024-06-18 DIAGNOSIS — Z881 Allergy status to other antibiotic agents status: Secondary | ICD-10-CM

## 2024-06-18 DIAGNOSIS — M1990 Unspecified osteoarthritis, unspecified site: Secondary | ICD-10-CM | POA: Diagnosis present

## 2024-06-18 DIAGNOSIS — E119 Type 2 diabetes mellitus without complications: Secondary | ICD-10-CM | POA: Diagnosis not present

## 2024-06-18 DIAGNOSIS — Z87891 Personal history of nicotine dependence: Secondary | ICD-10-CM | POA: Diagnosis not present

## 2024-06-18 DIAGNOSIS — Z9049 Acquired absence of other specified parts of digestive tract: Secondary | ICD-10-CM

## 2024-06-18 DIAGNOSIS — Z79899 Other long term (current) drug therapy: Secondary | ICD-10-CM | POA: Diagnosis not present

## 2024-06-18 DIAGNOSIS — Z7902 Long term (current) use of antithrombotics/antiplatelets: Secondary | ICD-10-CM | POA: Diagnosis not present

## 2024-06-18 DIAGNOSIS — E785 Hyperlipidemia, unspecified: Secondary | ICD-10-CM | POA: Diagnosis present

## 2024-06-18 DIAGNOSIS — R58 Hemorrhage, not elsewhere classified: Secondary | ICD-10-CM

## 2024-06-18 HISTORY — PX: RENAL ANGIOGRAPHY: CATH118260

## 2024-06-18 LAB — APTT: aPTT: 30 s (ref 24–36)

## 2024-06-18 LAB — BASIC METABOLIC PANEL WITH GFR
Anion gap: 9 (ref 5–15)
BUN: 28 mg/dL — ABNORMAL HIGH (ref 8–23)
CO2: 20 mmol/L — ABNORMAL LOW (ref 22–32)
Calcium: 8.2 mg/dL — ABNORMAL LOW (ref 8.9–10.3)
Chloride: 110 mmol/L (ref 98–111)
Creatinine, Ser: 1.06 mg/dL — ABNORMAL HIGH (ref 0.44–1.00)
GFR, Estimated: 51 mL/min — ABNORMAL LOW
Glucose, Bld: 281 mg/dL — ABNORMAL HIGH (ref 70–99)
Potassium: 4.2 mmol/L (ref 3.5–5.1)
Sodium: 139 mmol/L (ref 135–145)

## 2024-06-18 LAB — HEMOGLOBIN AND HEMATOCRIT, BLOOD
HCT: 19.2 % — ABNORMAL LOW (ref 36.0–46.0)
HCT: 30 % — ABNORMAL LOW (ref 36.0–46.0)
Hemoglobin: 6.1 g/dL — ABNORMAL LOW (ref 12.0–15.0)
Hemoglobin: 10.4 g/dL — ABNORMAL LOW (ref 12.0–15.0)

## 2024-06-18 LAB — BLOOD GAS, VENOUS
Acid-base deficit: 10.3 mmol/L — ABNORMAL HIGH (ref 0.0–2.0)
Acid-base deficit: 4.8 mmol/L — ABNORMAL HIGH (ref 0.0–2.0)
Bicarbonate: 17.2 mmol/L — ABNORMAL LOW (ref 20.0–28.0)
Bicarbonate: 20.5 mmol/L (ref 20.0–28.0)
O2 Saturation: 53 %
O2 Saturation: 63.1 %
Patient temperature: 37
Patient temperature: 37
pCO2, Ven: 46 mmHg (ref 44–60)
pCO2, Ven: 38 mmHg — ABNORMAL LOW (ref 44–60)
pH, Ven: 7.18 — CL (ref 7.25–7.43)
pH, Ven: 7.34 (ref 7.25–7.43)
pO2, Ven: 36 mmHg (ref 32–45)
pO2, Ven: 33 mmHg (ref 32–45)

## 2024-06-18 LAB — PROTIME-INR
INR: 1.1 (ref 0.8–1.2)
Prothrombin Time: 14.4 s (ref 11.4–15.2)

## 2024-06-18 LAB — CREATININE, SERUM
Creatinine, Ser: 1.12 mg/dL — ABNORMAL HIGH (ref 0.44–1.00)
GFR, Estimated: 47 mL/min — ABNORMAL LOW

## 2024-06-18 LAB — PREPARE RBC (CROSSMATCH)

## 2024-06-18 LAB — BUN: BUN: 28 mg/dL — ABNORMAL HIGH (ref 8–23)

## 2024-06-18 LAB — ABO/RH: ABO/RH(D): A POS

## 2024-06-18 LAB — GLUCOSE, CAPILLARY
Glucose-Capillary: 155 mg/dL — ABNORMAL HIGH (ref 70–99)
Glucose-Capillary: 190 mg/dL — ABNORMAL HIGH (ref 70–99)
Glucose-Capillary: 238 mg/dL — ABNORMAL HIGH (ref 70–99)
Glucose-Capillary: 260 mg/dL — ABNORMAL HIGH (ref 70–99)

## 2024-06-18 LAB — LACTIC ACID, PLASMA: Lactic Acid, Venous: 2.3 mmol/L (ref 0.5–1.9)

## 2024-06-18 MED ORDER — MIDAZOLAM HCL 5 MG/5ML IJ SOLN
INTRAMUSCULAR | Status: AC
  Filled 2024-06-18: qty 5

## 2024-06-18 MED ORDER — SODIUM CHLORIDE 0.9 % IV BOLUS
500.0000 mL | Freq: Once | INTRAVENOUS | Status: AC
  Administered 2024-06-18: 500 mL via INTRAVENOUS

## 2024-06-18 MED ORDER — SODIUM CHLORIDE 0.9 % IV SOLN: INTRAVENOUS | Status: DC

## 2024-06-18 MED ORDER — MIDAZOLAM HCL 2 MG/ML PO SYRP: 8.0000 mg | ORAL_SOLUTION | Freq: Once | ORAL | Status: DC | PRN

## 2024-06-18 MED ORDER — FENTANYL CITRATE (PF) 100 MCG/2ML IJ SOLN
INTRAMUSCULAR | Status: AC
  Filled 2024-06-18: qty 2

## 2024-06-18 MED ORDER — VANCOMYCIN HCL IN DEXTROSE 1-5 GM/200ML-% IV SOLN
1000.0000 mg | INTRAVENOUS | Status: AC
  Administered 2024-06-18: 1000 mg via INTRAVENOUS

## 2024-06-18 MED ORDER — SODIUM CHLORIDE 0.9% IV SOLUTION: Freq: Once | INTRAVENOUS | Status: DC

## 2024-06-18 MED ORDER — ONDANSETRON HCL 4 MG/2ML IJ SOLN: 4.0000 mg | Freq: Four times a day (QID) | INTRAMUSCULAR | Status: DC | PRN

## 2024-06-18 MED ORDER — PHENYLEPHRINE HCL-NACL 20-0.9 MG/250ML-% IV SOLN
0.0000 ug/min | INTRAVENOUS | Status: DC
  Administered 2024-06-18: 20 ug/min via INTRAVENOUS

## 2024-06-18 MED ORDER — VANCOMYCIN HCL IN DEXTROSE 1-5 GM/200ML-% IV SOLN
INTRAVENOUS | Status: AC
  Filled 2024-06-18: qty 200

## 2024-06-18 MED ORDER — PHENYLEPHRINE HCL-NACL 20-0.9 MG/250ML-% IV SOLN: 25.0000 ug/min | INTRAVENOUS | Status: DC

## 2024-06-18 MED ORDER — SODIUM CHLORIDE 0.9 % IV SOLN: INTRAVENOUS | Status: AC

## 2024-06-18 MED ORDER — HEPARIN SODIUM (PORCINE) 1000 UNIT/ML IJ SOLN
INTRAMUSCULAR | Status: AC
  Filled 2024-06-18: qty 10

## 2024-06-18 MED ORDER — VASOPRESSIN 20 UNITS/100 ML INFUSION FOR SHOCK
0.0000 [IU]/min | INTRAVENOUS | Status: DC
  Filled 2024-06-18: qty 100

## 2024-06-18 MED ORDER — HEPARIN (PORCINE) IN NACL 2000-0.9 UNIT/L-% IV SOLN
INTRAVENOUS | Status: DC | PRN
  Administered 2024-06-18: 1000 mL

## 2024-06-18 MED ORDER — SODIUM CHLORIDE 0.9 % IV SOLN: 250.0000 mL | INTRAVENOUS | Status: AC | PRN

## 2024-06-18 MED ORDER — MORPHINE SULFATE (PF) 2 MG/ML IV SOLN
2.0000 mg | INTRAVENOUS | Status: DC | PRN
  Administered 2024-06-18: 2 mg via INTRAVENOUS
  Filled 2024-06-18: qty 1

## 2024-06-18 MED ORDER — ONDANSETRON HCL 4 MG/2ML IJ SOLN
INTRAMUSCULAR | Status: AC
  Filled 2024-06-18: qty 2

## 2024-06-18 MED ORDER — SODIUM CHLORIDE 0.9% FLUSH
3.0000 mL | Freq: Two times a day (BID) | INTRAVENOUS | Status: DC
  Administered 2024-06-18 – 2024-06-23 (×10): 3 mL via INTRAVENOUS

## 2024-06-18 MED ORDER — SODIUM CHLORIDE 0.9% FLUSH: 3.0000 mL | INTRAVENOUS | Status: DC | PRN

## 2024-06-18 MED ORDER — HYDROMORPHONE HCL 1 MG/ML IJ SOLN: 0.5000 mg | INTRAMUSCULAR | Status: DC | PRN

## 2024-06-18 MED ORDER — INSULIN ASPART 100 UNIT/ML IJ SOLN
0.0000 [IU] | INTRAMUSCULAR | Status: DC
  Administered 2024-06-18: 8 [IU] via SUBCUTANEOUS
  Administered 2024-06-18: 5 [IU] via SUBCUTANEOUS
  Filled 2024-06-18: qty 8
  Filled 2024-06-18: qty 5

## 2024-06-18 MED ORDER — SODIUM CHLORIDE 0.9% IV SOLUTION: Freq: Once | INTRAVENOUS | Status: AC

## 2024-06-18 MED ORDER — SODIUM BICARBONATE 8.4 % IV SOLN
100.0000 meq | Freq: Once | INTRAVENOUS | Status: AC
  Administered 2024-06-18: 100 meq via INTRAVENOUS
  Filled 2024-06-18: qty 50

## 2024-06-18 MED ORDER — MIDAZOLAM HCL (PF) 2 MG/2ML IJ SOLN
INTRAMUSCULAR | Status: DC | PRN
  Administered 2024-06-18: 1 mg
  Administered 2024-06-18: 2 mg via INTRAVENOUS
  Administered 2024-06-18 (×3): 1 mg via INTRAVENOUS

## 2024-06-18 MED ORDER — MIDAZOLAM HCL 2 MG/2ML IJ SOLN
INTRAMUSCULAR | Status: AC
  Filled 2024-06-18: qty 2

## 2024-06-18 MED ORDER — CLOPIDOGREL BISULFATE 75 MG PO TABS: 75.0000 mg | ORAL_TABLET | Freq: Every day | ORAL | 11 refills | Status: AC

## 2024-06-18 MED ORDER — SODIUM CHLORIDE 0.9 % IV BOLUS
INTRAVENOUS | Status: AC | PRN
  Administered 2024-06-18: 500 mL via INTRAVENOUS

## 2024-06-18 MED ORDER — STERILE WATER FOR INJECTION IV SOLN
INTRAVENOUS | Status: DC
  Filled 2024-06-18 (×2): qty 150
  Filled 2024-06-18: qty 1000

## 2024-06-18 MED ORDER — NOREPINEPHRINE 4 MG/250ML-% IV SOLN
0.0000 ug/min | INTRAVENOUS | Status: DC
  Administered 2024-06-18: 4 ug/min via INTRAVENOUS
  Administered 2024-06-19: 6 ug/min via INTRAVENOUS
  Administered 2024-06-19: 14 ug/min via INTRAVENOUS
  Administered 2024-06-19: 13 ug/min via INTRAVENOUS
  Administered 2024-06-19: 2 ug/min via INTRAVENOUS
  Filled 2024-06-18 (×4): qty 250

## 2024-06-18 MED ORDER — INSULIN ASPART 100 UNIT/ML IJ SOLN
0.0000 [IU] | INTRAMUSCULAR | Status: DC
  Administered 2024-06-19: 4 [IU] via SUBCUTANEOUS
  Administered 2024-06-19: 3 [IU] via SUBCUTANEOUS
  Administered 2024-06-19: 7 [IU] via SUBCUTANEOUS
  Administered 2024-06-19: 4 [IU] via SUBCUTANEOUS
  Administered 2024-06-19: 7 [IU] via SUBCUTANEOUS
  Administered 2024-06-19: 3 [IU] via SUBCUTANEOUS
  Administered 2024-06-20: 4 [IU] via SUBCUTANEOUS
  Administered 2024-06-20: 3 [IU] via SUBCUTANEOUS
  Administered 2024-06-20: 2 [IU] via SUBCUTANEOUS
  Administered 2024-06-20: 3 [IU] via SUBCUTANEOUS
  Administered 2024-06-20 (×2): 4 [IU] via SUBCUTANEOUS
  Administered 2024-06-21 (×2): 3 [IU] via SUBCUTANEOUS
  Administered 2024-06-21 (×2): 7 [IU] via SUBCUTANEOUS
  Administered 2024-06-21: 11 [IU] via SUBCUTANEOUS
  Administered 2024-06-22 (×2): 3 [IU] via SUBCUTANEOUS
  Administered 2024-06-22: 4 [IU] via SUBCUTANEOUS
  Administered 2024-06-22: 3 [IU] via SUBCUTANEOUS
  Administered 2024-06-22: 7 [IU] via SUBCUTANEOUS
  Administered 2024-06-23 (×2): 4 [IU] via SUBCUTANEOUS
  Administered 2024-06-23: 7 [IU] via SUBCUTANEOUS
  Administered 2024-06-23: 3 [IU] via SUBCUTANEOUS
  Filled 2024-06-18: qty 4
  Filled 2024-06-18: qty 3
  Filled 2024-06-18 (×2): qty 7
  Filled 2024-06-18: qty 3
  Filled 2024-06-18: qty 10
  Filled 2024-06-18: qty 4
  Filled 2024-06-18: qty 7
  Filled 2024-06-18: qty 11
  Filled 2024-06-18: qty 3
  Filled 2024-06-18: qty 4
  Filled 2024-06-18: qty 3
  Filled 2024-06-18: qty 4
  Filled 2024-06-18 (×2): qty 7
  Filled 2024-06-18: qty 3
  Filled 2024-06-18 (×2): qty 4
  Filled 2024-06-18 (×4): qty 3
  Filled 2024-06-18: qty 7
  Filled 2024-06-18: qty 3
  Filled 2024-06-18: qty 4
  Filled 2024-06-18: qty 3

## 2024-06-18 MED ORDER — OXYCODONE HCL 5 MG PO TABS
5.0000 mg | ORAL_TABLET | ORAL | Status: DC | PRN
  Administered 2024-06-19 – 2024-06-22 (×5): 5 mg via ORAL
  Filled 2024-06-18: qty 1
  Filled 2024-06-18: qty 2
  Filled 2024-06-18 (×4): qty 1

## 2024-06-18 MED ORDER — HYDRALAZINE HCL 20 MG/ML IJ SOLN: 5.0000 mg | INTRAMUSCULAR | Status: DC | PRN

## 2024-06-18 MED ORDER — ONDANSETRON HCL 4 MG/2ML IJ SOLN
4.0000 mg | Freq: Once | INTRAMUSCULAR | Status: AC
  Administered 2024-06-18: 4 mg via INTRAVENOUS

## 2024-06-18 MED ORDER — DIPHENHYDRAMINE HCL 50 MG/ML IJ SOLN: 50.0000 mg | Freq: Once | INTRAMUSCULAR | Status: DC | PRN

## 2024-06-18 MED ORDER — LACTATED RINGERS IV BOLUS
500.0000 mL | Freq: Once | INTRAVENOUS | Status: AC
  Administered 2024-06-19: 500 mL via INTRAVENOUS

## 2024-06-18 MED ORDER — LIDOCAINE HCL (PF) 1 % IJ SOLN
INTRAMUSCULAR | Status: DC | PRN
  Administered 2024-06-18: 10 mL
  Administered 2024-06-18: 10 mL via INTRADERMAL

## 2024-06-18 MED ORDER — HEPARIN SODIUM (PORCINE) 1000 UNIT/ML IJ SOLN
INTRAMUSCULAR | Status: DC | PRN
  Administered 2024-06-18: 5000 [IU] via INTRAVENOUS

## 2024-06-18 MED ORDER — PHENYLEPHRINE HCL-NACL 20-0.9 MG/250ML-% IV SOLN
INTRAVENOUS | Status: AC
  Filled 2024-06-18: qty 250

## 2024-06-18 MED ORDER — HEPARIN (PORCINE) IN NACL 1000-0.9 UT/500ML-% IV SOLN
INTRAVENOUS | Status: DC | PRN
  Administered 2024-06-18: 1000 mL

## 2024-06-18 MED ORDER — SODIUM CHLORIDE 0.9 % IV SOLN: 250.0000 mL | INTRAVENOUS | Status: AC

## 2024-06-18 MED ORDER — FENTANYL CITRATE (PF) 100 MCG/2ML IJ SOLN
INTRAMUSCULAR | Status: DC | PRN
  Administered 2024-06-18: 25 ug via INTRAVENOUS
  Administered 2024-06-18: 50 ug via INTRAVENOUS
  Administered 2024-06-18: 25 ug
  Administered 2024-06-18: 25 ug via INTRAVENOUS

## 2024-06-18 MED ORDER — ONDANSETRON HCL 4 MG/2ML IJ SOLN
INTRAMUSCULAR | Status: DC | PRN
  Administered 2024-06-18: 4 mg via INTRAVENOUS

## 2024-06-18 MED ORDER — IODIXANOL 320 MG/ML IV SOLN
INTRAVENOUS | Status: DC | PRN
  Administered 2024-06-18: 50 mL
  Administered 2024-06-18: 40 mL via INTRA_ARTERIAL

## 2024-06-18 MED ORDER — SODIUM CHLORIDE 0.9 % IV BOLUS: 500.0000 mL | Freq: Once | INTRAVENOUS | Status: DC

## 2024-06-18 NOTE — Discharge Instructions (Addendum)
 Femoral Site Care Refer to this sheet in the next few weeks. These instructions provide you with information about caring for yourself after your procedure. Your health care provider may also give you more specific instructions. Your treatment has been planned according to current medical practices, but problems sometimes occur. Call your health care provider if you have any problems or questions after your procedure. What can I expect after the procedure? After your procedure, it is typical to have the following: Bruising at the site that usually fades within 1-2 weeks. Blood collecting in the tissue (hematoma) that may be painful to the touch. It should usually decrease in size and tenderness within 1-2 weeks.  Follow these instructions at home:  Take medicines only as directed by your health care provider.  If you take Metformin, hold for 48hours after your procedure.  The x-ray dye causes you to pass a considerate amount of urine.  For this reason, you will be asked to drink plenty of liquids after the procedure to prevent dehydration.  You may resume you regular diet.  Avoid caffeine products.   You may shower 24 hours after procedure. Leave the bandage on your access site for.  You may wash around your dressing but do not rub the site, this may cause bleeding.  Pat the area dry with a clean towel. After 48hours remove bandage and leave open to air. Do not take baths, swim, or use a hot tub for 7 days. Check your insertion site every day for redness, swelling, or drainage. If you lose feeling or develop tingling or pain in your leg or foot after the procedure, please walk around first.  If the discomfort does not improve , contact your physician and proceed to the nearest emergency room.  Loss of feeling in your leg might mean that a blockage has formed in the artery and this can be appropriately treated.  Limit your activity for the next two days after your procedure.  Avoid stooping, bending,  heavy lifting or exertion as this may put pressure on the insertion site.  Resume normal activities in 48 hours.   check the insertion site occasionally.  If any oozing occurs or there is apparent swelling, firm pressure over the site will prevent a bruise from forming.  You can not hurt anything by pressing directly on the site.  The pressure stops the bleeding by allowing a small clot to form.  If the bleeding continues after the pressure has been applied for more than 15 minutes, call 911 or go to the nearest emergency room.    Apply pressure to access site if you have to laugh, cough, or sneeze. Do not apply powder or lotion to the site. Limit use of stairs to twice a day for the first 2-3 days or as directed by your health care provider. Do not squat for the first 2-3 days or as directed by your health care provider. Do not lift over 10 lb (4.5 kg) for 5 days after your procedure or as directed by your health care provider. Ask your health care provider when it is okay to: Return to work or school. Resume usual physical activities or sports. Resume sexual activity. Do not drive home if you are discharged the same day as the procedure. Have someone else drive you. You may drive 48 hours after the procedure unless otherwise instructed by your health care provider. Do not operate machinery or power tools for 24 hours after the procedure or as directed by  your health care provider. If your procedure was done as an outpatient procedure, which means that you went home the same day as your procedure, a responsible adult should be with you for the first 24 hours after you arrive home. Keep all follow-up visits as directed by your health care provider. This is important. Contact a health care provider if: You have a fever. You have chills. You have increased bleeding from the site. Hold pressure on the site. Get help right away if: You have unusual pain at the site. You have redness, warmth, or  swelling at the site. You have drainage (other than a small amount of blood on the dressing) from the site. The site is bleeding, and the bleeding does not stop after 30 minutes of holding steady pressure on the site. Your leg or foot becomes pale, cool, tingly, or numb. This information is not intended to replace advice given to you by your health care provider. Make sure you discuss any questions you have with your health care provider. Document Released: 01/24/2014 Document Revised: 10/29/2015 Document Reviewed: 12/10/2013 Elsevier Interactive Patient Education  2018 [employer].  Contact a healthcare provider if:  Abdomen feels tight or swollen Feelings of weakness, dizziness Increasing pain

## 2024-06-18 NOTE — Op Note (Signed)
 [facility]  Percutaneous Study/Intervention Procedural Note    Surgeon(s): Best Boy: None  Pre-operative Diagnosis: Bilateral renal artery stenosis, renovascular hypertension  Post-operative diagnosis:  Same  Procedure(s) Performed:             1.  Ultrasound guidance for vascular access right femoral artery             2.  Catheter placement into right renal artery from right femoral approach             3.  Aortogram and selective right renal angiogram             4.  Balloon expandable stent placement to right renal artery with a 6 mm diameter x 16 mm length Lifestream stent              5.  StarClose closure device right common femoral artery              Contrast: 40 cc  EBL: 0  Fluoro Time: 5.3 minutes  Moderate conscious sedation: Continuous ECG pulse oximetry and cardiopulmonary monitoring is performed throughout the entire procedure by the interventional radiology nurse.  Parenteral Versed  and fentanyl  are administered.  Total sedation time is 37 minutes  Indications:  The patient has severe hypertension despite multiple antihypertensive medications.  CT angiogram was performed and demonstrates bilateral 80% renal artery stenosis. The patient has suboptimal blood pressure control. Given the clinical scenario and the noninvasive findings, angiogram is indicated for further evaluation of her renal artery and potential treatment. Risks and benefits are discussed and informed consent is obtained.  Procedure:  The patient was identified and appropriate procedural time out was performed.  The patient was then placed supine on the table and prepped and draped in the usual sterile fashion. Moderate conscious sedation was administered with a face to face encounter with the patient throughout the procedure with my supervision of the RN administering medicines and monitoring the patients vital signs and mental status throughout from the start  of the procedure until the patient was taken to the recovery room  Ultrasound was used to evaluate the right common femoral artery.  It was patent .  A digital ultrasound image was acquired.  A Seldinger needle was used to access the right common femoral artery under direct ultrasound guidance and a permanent image was performed.  A 0.035 J wire was advanced without resistance and a 5Fr sheath was placed.  Pigtail catheter was placed into the aorta at the L1 level and an AP aortogram was performed. This demonstrated bilateral renal artery stenosis. The patient was then systemically heparinized with 5000 units of intravenous heparin .   The 5 French Pinnacle sheath is then exchanged for a 6 French Ansell.  A VS1 catheter is used to cannulate the right renal artery and selective imaging was performed. This confirmed a greater than 80% stenosis of the origin of the right renal artery.  At this point I selected the super core wire and crossed the lesion without difficulty.   I then selected a 6 mm diameter x 16 mm length balloon expandable Lifestream stent and brought this across the lesion.  This was deployed encompassing the lesion with its proximal extent going back into the aorta for a approximately 3 mm.  This was inflated to 8 ATM and the waist resolved.  Completion angiogram showed wide patency of the stent and origin of the right renal artery with less than 10% residual  stenosis.   The sheath was removed. Oblique arteriogram was performed of the right femoral artery and StarClose closure device was deployed in the usual fashion with excellent hemostatic result. The patient was taken to the recovery room in stable condition having tolerated the procedure well.  Findings:               Aortogram/Renal Arteries: Abdominal aorta is opacified with a bolus injection contrast.  It is widely patent and free of any hemodynamically significant atherosclerotic changes.  There is bilateral renal artery stenosis that  appears consistent with the CT angiogram at greater than 80%.  Selective imaging of the right renal and renal artery demonstrates normal distal anatomy with a high-grade lesion at the ostia.  Following placement of a 6 mm x 60 mm Lifestream stent there is now resolution of the ostial lesion with less than 10% residual stenosis.   Condition:  Stable  Complications: None   Kim Ballard 06/18/2024 2:11 PM  This note was created with Dragon Medical transcription system. Any errors in dictation are purely unintentional.

## 2024-06-18 NOTE — Consult Note (Signed)
 "  NAME:  Kim Ballard, MRN:  [id number], DOB:  1934-08-20, LOS: 0 ADMISSION DATE:  06/18/2024, CONSULTATION DATE:  06/18/24 REFERRING MD:  Dr. Jama, CHIEF COMPLAINT:  Hypotension   History of Present Illness:  Kim Ballard is an 89 year old female with a past medical history significant for hypertension, bilateral renal artery stenosis, renovascular hypertension, GERD, fibromyalgia and depression who was admitted to [facility] for a scheduled right renal stent placement performed by Dr. Jama.  The patient was taken to the Vascular Lab for the procedure. At approximately 3:45pm, Dr. Jama was contacted due to persistently low blood pressure, as well as new onset abdominal pain. Upon Dr. Kathryn arrival at bedside, the patient's blood pressure was 91/70, although post-op BP goal was to keep her SBP >160. Given these changes, an CT abd/pelvis without contrast was obtained and demonstrated perinephric changes consistent with hematoma. It was decided that Dr. Jama would take her back to the OR/special procedures and repeat angiography to determine if a bleeding source could be identified. Prior to returning to the cath lab, the patient was given a 500cc bolus of fluids. Blood pressure continued to decrease, a central line was placed and she was started on a phenylephrine  drip. Hemoglobin was checked, resulted at 6.1. The patient was ordered 2 units pRBCs.   After returning to the cath lab, the stent placed was patent and in good position without evidence of extravasation. There was an area in the mid to middle upper area of the kidney identified as a bleeding source and was embolized. The patient tolerated the procedure well and was taken to PACU.   Pre-Operative Vitals: temp 98.6, BP 193/73, HR 91, RR 20, SpO2 98% on room air Post- Op BP: 90s/40s  Pertinent Labs/Diagnostics: Following surgery, labs were drawn and she was found to have metabolic acidosis, acute blood loss anemia and  hyperglycemia.  Chemistry: Na+:139, K+: 4.2, BUN/Cr.: 28/1.06, Serum CO2/ AG: 20/9, Glucose: 281  Hematology: Hgb: 6.1, HCT: 19.2, aPTT 30 VBG: pH 7.18, pCO2 46, pO2 46, Acid Base Deficit 10.3, Bicarb 17.2  CT Abd/Pelvis wo Impression: 1. Large right renal subcapsular hematoma with extension into the surrounding perinephric and retroperitoneal space. 2. Mild subcutaneous soft tissue stranding in the right inguinal region, likely related to recent vascular access. 3.  Aortic Atherosclerosis  Medications Administered:  NaCl 500mL bolus 2 units pRBCs Zofran  4mg  Phenylephrine   PCCM consulted for admission due to hypotension requiring vasopressors.   Pertinent Medical History  Hypertension Type II Diabetes Mellitus Anemia Fibromyalgia Depression  Significant Hospital Events: Including procedures, antibiotic start and stop dates in addition to other pertinent events   06/18/24: Taken to the cath lab for a scheduled right renal artery stent placement. Post-operatively became hypotensive requiring vasopressors. CT Abd/Pelvis identified area of the kidney likely to the be the source of the bleeding causing acute hypotension. Hgb checked and resulted at 6.1. Was administered 2 units pRBCs. Was taken back to the cath lab and the bleed was cauterized. PCCM consulted for admission d/t vasopressor requirements.  Interim History / Subjective:  Repeat VBG much improved. Decreased bicarb gtt to 75/hr. Remains on levophed , no longer on neo.   Objective    Blood pressure (!) 106/54, pulse (!) 109, temperature 98.6 F (37 C), temperature source Axillary, resp. rate 18, height 5' 6 (1.676 m), weight 62.6 kg, SpO2 100%.        Intake/Output Summary (Last 24 hours) at 06/18/2024 2341 Last data filed at 06/18/2024 2319  Gross per 24 hour  Intake 732 ml  Output 215 ml  Net 517 ml   Filed Weights   06/18/24 1224  Weight: 62.6 kg    Examination: General: Adult female, acutely ill, lying in  bed on room air, in NAD HENT: anicteric sclera, atraumatic, normocephalic, neck supple, no JVD CV: RRR, S1 S2, NSR on monitor, no r/m/g Pulm: Regular, non labored on 4L [place] , breath sounds equal throughout, no wheezing/rhonchi/rales GI: soft, non-distended, non- tender, no rebound/guarding, bowel sounds x 4 Extremities: warm/dry, pulses + 2 R/P, no edema Neuro: A&O x 4, follows commands, PERRL GU: foley in place   Resolved problem list   Assessment and Plan   #Acute Pain s/t Right Renal Artery Stent Hx: Depression - Supportive care - Control pain as able - Dilaudid  0.5-1mg  q2h prn - Morphine  2 mg q1h prn - Delirium precautions  #Acute Hypoxic Respiratory Failure s/t Acute Blood Loss Anemia - Supplemental oxygen to maintain SpO2 > 92% - Currently on 4L nasal cannula - CXR as indicated  #Hypovolemic/Hemorrhagic Shock secondary to Acute Blood Loss due to .SABRA #Perinephric Hematoma - Continuous cardiac monitoring - Vasopressors to maintain MAP >65 ~ currently on levophed  - Weaned off of Neo; wean Levo as able - Hold home losartan and hydrochlorothiazide  for now - EKG prn - Trend lactic acid: initial 2.3 - Ordered bolus of LR - Vascular is following  #Metabolic Acidosis + Lactic Acidosis s/t Hypovolemic Shock - Monitor I/O - Trend BMP (Baseline Cr 1.04-1.18; 1.12 on admission) - Avoid nephrotoxins as able - Ensure adequate renal perfusion - Previously received 2 amps bicarb - Continue bicarb gtt as recent VBG bicarb was only 20; decreased rate to 75 from 125 - Repeat VBG in the am  #Nausea #GERD - Antiemetics as indicated - Zofran  4mg  q6h prn  #Acute Blood Loss Anemia s/t Perinephric Hematoma #VTE prophylaxis Hx: Iron  Deficiency Anemia - Trend CBC and monitor coags - Transfuse if Hgb < 7 or platelets <10 - Received 2 units pRBCs, recheck Hgb 10.4 (up from 6.1) - An additional 2 units have been ordered and are being held - Monitor for s/sx of bleeding -  SCDs  #Type II Diabetes Mellitus - ICU hyper/hypoglycemic protocol - CBG Q4h - Goal range 140-180 - SSI: Novolog  - Hold metformin for now  Labs   CBC: Recent Labs  Lab 06/13/24 0927 06/18/24 1645  WBC 5.1  --   NEUTROABS 3.5  --   HGB 10.1* 6.1*  HCT 30.9* 19.2*  MCV 92.5  --   PLT 179  --     Basic Metabolic Panel: Recent Labs  Lab 06/13/24 0927 06/18/24 1218 06/18/24 1645  NA 141  --  139  K 4.8  --  4.2  CL 106  --  110  CO2 23  --  20*  GLUCOSE 139*  --  281*  BUN 38* 28* 28*  CREATININE 1.28* 1.12* 1.06*  CALCIUM 10.1  --  8.2*   GFR: Estimated Creatinine Clearance: 35 mL/min (A) (by C-G formula based on SCr of 1.06 mg/dL (H)). Recent Labs  Lab 06/13/24 0927  WBC 5.1    Liver Function Tests: No results for input(s): AST, ALT, ALKPHOS, BILITOT, PROT, ALBUMIN in the last 168 hours. No results for input(s): LIPASE, AMYLASE in the last 168 hours. No results for input(s): AMMONIA in the last 168 hours.  ABG    Component Value Date/Time   HCO3 20.5 06/18/2024 2306   ACIDBASEDEF 4.8 (  H) 06/18/2024 2306   O2SAT 63.1 06/18/2024 2306     Coagulation Profile: No results for input(s): INR, PROTIME in the last 168 hours.  Cardiac Enzymes: No results for input(s): CKTOTAL, CKMB, CKMBINDEX, TROPONINI in the last 168 hours.  HbA1C: No results found for: HGBA1C  CBG: Recent Labs  Lab 06/18/24 1215 06/18/24 1520 06/18/24 1902 06/18/24 2317  GLUCAP 155* 190* 238* 260*    Review of Systems:   Review of Systems  Constitutional:  Positive for malaise/fatigue. Negative for chills and fever.  Respiratory:  Negative for shortness of breath and wheezing.   Cardiovascular:  Negative for chest pain and palpitations.  Gastrointestinal:  Positive for nausea. Negative for abdominal pain and vomiting.  Neurological:  Negative for dizziness, weakness and headaches.     Past Medical History:  She,  has a past medical history  of Allergic state, Anemia, Arthritis, Cancer (HCC), Chronic cystitis with hematuria, Complication of anesthesia (10/28/2022), Depression, Diabetes mellitus without complication (HCC), Fibrocystic disease of both breasts, Fibromyalgia, GERD (gastroesophageal reflux disease), Hypertension, IBS (irritable bowel syndrome), Melanoma (HCC) (07/09/2021), Melanoma of neck (HCC) (09/06/2021), Nausea, Skin cancer, and Wears dentures.   Surgical History:   Past Surgical History:  Procedure Laterality Date   ABDOMINAL HYSTERECTOMY     APPENDECTOMY     BREAST BIOPSY Right 2001   surgical bx    CATARACT EXTRACTION W/PHACO Right 03/08/2017   Procedure: CATARACT EXTRACTION PHACO AND INTRAOCULAR LENS PLACEMENT (IOC) RIGHT DIABETIC TORIC;  Surgeon: Mittie Gaskin, MD;  Location: [facility];  Service: Ophthalmology;  Laterality: Right;  Diabetic - oral meds   CATARACT EXTRACTION W/PHACO Left 04/17/2017   Procedure: CATARACT EXTRACTION PHACO AND INTRAOCULAR LENS PLACEMENT (IOC) LEFT DIABETIC;  Surgeon: Mittie Gaskin, MD;  Location: [facility];  Service: Ophthalmology;  Laterality: Left;  diabetic-oral meds   CHOLECYSTECTOMY     COLONOSCOPY     COLONOSCOPY N/A 10/05/2022   Procedure: COLONOSCOPY;  Surgeon: Toledo, Ladell POUR, MD;  Location: [facility] ENDOSCOPY;  Service: Gastroenterology;  Laterality: N/A;   COLONOSCOPY WITH PROPOFOL  N/A 08/27/2015   Procedure: COLONOSCOPY WITH PROPOFOL ;  Surgeon: Lamar ONEIDA Holmes, MD;  Location: [facility];  Service: Endoscopy;  Laterality: N/A;   COLONOSCOPY WITH PROPOFOL  N/A 05/02/2016   Procedure: COLONOSCOPY WITH PROPOFOL ;  Surgeon: Lamar ONEIDA Holmes, MD;  Location: [facility] ENDOSCOPY;  Service: Endoscopy;  Laterality: N/A;   ESOPHAGOGASTRODUODENOSCOPY     ESOPHAGOGASTRODUODENOSCOPY N/A 10/05/2022   Procedure: ESOPHAGOGASTRODUODENOSCOPY (EGD);  Surgeon: Toledo, Ladell POUR, MD;  Location: [facility] ENDOSCOPY;  Service: Gastroenterology;  Laterality: N/A;    ESOPHAGOGASTRODUODENOSCOPY (EGD) WITH PROPOFOL  N/A 08/27/2015   Procedure: ESOPHAGOGASTRODUODENOSCOPY (EGD) WITH PROPOFOL ;  Surgeon: Lamar ONEIDA Holmes, MD;  Location: [facility] END[facility]COPY;  Service: Endoscopy;  Laterality: N/A;   EYE SURGERY     HERNIA REPAIR     IR BONE MARROW BIOPSY & ASPIRATION  10/28/2022   IR KYPHO LUMBAR INC FX REDUCE BONE BX UNI/BIL CANNULATION INC/IMAGING  12/12/2023   IR KYPHO THORACIC WITH BONE BIOPSY  03/19/2024   IR KYPHO THORACIC WITH BONE BIOPSY  04/16/2024   IR RADIOLOGIST EVAL & MGMT  12/05/2023   IR RADIOLOGIST EVAL & MGMT  12/14/2023   IR RADIOLOGIST EVAL & MGMT  01/02/2024   IR RADIOLOGIST EVAL & MGMT  03/14/2024   IR RADIOLOGIST EVAL & MGMT  04/11/2024   MICROLARYNGOSCOPY Right 11/11/2022   Procedure: MICROLARYNGOSCOPY WITH BIOPSY OF PYRIFORM SINUS MASS;  Surgeon: Herminio Miu, MD;  Location: [facility];  Service: ENT;  Laterality: Right;  Diabetic     Social History:   reports that she quit smoking about 66 years ago. Her smoking use included cigarettes. She has never used smokeless tobacco. She reports that she does not drink alcohol and does not use drugs.   Family History:  Her family history includes COPD in her father; Diabetes in her maternal grandmother; Hyperlipidemia in her father; Hypertension in her mother; Kidney disease in her father; Ovarian cancer in her paternal grandmother. There is no history of Breast cancer.   Allergies Allergies[1]   Home Medications  Prior to Admission medications  Medication Sig Start Date End Date Taking? Authorizing Provider  Ascorbic Acid (VITAMIN C) 1000 MG tablet Take 1,000 mg by mouth 2 (two) times daily.   Yes [provider]  cholecalciferol  (VITAMIN D3) 25 MCG (1000 UT) tablet Take 1,000 Units by mouth 2 (two) times daily.   Yes [provider]  clopidogrel  (PLAVIX ) 75 MG tablet Take 1 tablet (75 mg total) by mouth daily. 06/18/24  Yes Schnier, Cordella MATSU, MD  cyanocobalamin  (VITAMIN B12)  1000 MCG tablet Take 1,000 mcg by mouth daily.   Yes [provider]  glipiZIDE (GLUCOTROL XL) 5 MG 24 hr tablet Take 10 mg by mouth in the morning and at bedtime.   Yes [provider]  hydrochlorothiazide  (HYDRODIURIL ) 25 MG tablet Take 25 mg by mouth daily.   Yes [provider]  losartan (COZAAR) 50 MG tablet Take 50 mg by mouth daily. Patient taking differently: Take 75 mg by mouth daily. 05/01/24 05/01/25 Yes [provider]  Multiple Vitamins-Minerals (HAIR SKIN & NAILS PO) Take by mouth daily.   Yes [provider]  Multiple Vitamins-Minerals (MULTIVITAMIN WITH MINERALS) tablet Take 1 tablet by mouth daily.   Yes [provider]  Omega 3 1000 MG CAPS Take 1,000 mg by mouth daily.   Yes [provider]  fluticasone (FLONASE) 50 MCG/ACT nasal spray Place 2 sprays into both nostrils daily as needed for allergies.    [provider]  gemfibrozil  (LOPID ) 600 MG tablet Take 600 mg by mouth 2 (two) times daily before a meal.    [provider]  iron  polysaccharides (NIFEREX) 150 MG capsule Take 150 mg by mouth daily.    [provider]  metFORMIN (GLUCOPHAGE-XR) 500 MG 24 hr tablet Take 1,000 mg by mouth 2 (two) times daily. 01/03/24   [provider]  pantoprazole  (PROTONIX ) 40 MG tablet Take 40 mg by mouth daily.    [provider]  Probiotic Product (PROBIOTIC PO) Take by mouth daily.    [provider]  vitamin E 180 MG (400 UNITS) capsule Take 400 Units by mouth daily.    [provider]     Critical care time: 65 minutes     Robet Kim, PA-C [facility] PCCM Team Contact Info: [phone]         [1]  Allergies Allergen Reactions   Biaxin [Clarithromycin] Other (See Comments)    GI UPSET    Brompheniramine Maleate    Ceclor [Cefaclor]    Ciprofloxacin    Clinoril [Sulindac]    Codeine Sulfate    Diovan [Valsartan]     Doxycycline     Able to tolerate low dose   Erythromycin    Esomeprazole    Fluoxetine Other (See Comments)    UNKNOWN   Guaifenesin & Derivatives    Levaquin [Levofloxacin In D5w]     Can take  in low doses   Lisinopril    Lodine [Etodolac]    Macrodantin [Nitrofurantoin Macrocrystal]    Medrol  [Methylprednisolone ]    Mobic [Meloxicam]    Motrin [Ibuprofen]    Nexium [Esomeprazole Magnesium ]    Nitrofurantoin Other (See Comments)   Norvasc [Amlodipine Besylate]    Omnicef [Cefdinir] Other (See Comments)    Developed whelps 10 days laster    Penicillins    Prednisone  Nausea And Vomiting    Severe   Prozac [Fluoxetine Hcl] Other (See Comments)    Makes her feel crazy    Reglan [Metoclopramide]    Seldane [Terfenadine]    Sulfa Antibiotics Other (See Comments)   Toprol Xl [Metoprolol Tartrate]    Triamterene    Tussionex Pennkinetic Er [Hydrocod Poli-Chlorphe Poli Er]    Vibramycin [Doxycycline Calcium]    Amlodipine Rash    Other reaction(s): Other (See Comments) Edema Edema   Pseudoephedrine Palpitations   "

## 2024-06-18 NOTE — Progress Notes (Signed)
 Took patient to CT for abdominal scan.

## 2024-06-18 NOTE — Progress Notes (Addendum)
 Patient taken back to vascular lab.

## 2024-06-18 NOTE — Progress Notes (Signed)
 500cc bolus finished. MD aware of BP

## 2024-06-18 NOTE — Progress Notes (Signed)
 Dr. Jama at bedside consenting pt for blood and placing central line

## 2024-06-18 NOTE — Op Note (Signed)
 [facility]  Percutaneous Study/Intervention Procedural Note     Surgeon(s): Best Boy: none  Pre-operative Diagnosis:  1.  Perinephric hematoma status post right renal artery stent placement 2.  Hypotensive shock  Post-operative diagnosis:  Same  Procedure(s) Performed:             1.  Ultrasound guidance for vascular access left femoral artery             2.  Catheter placement into right renal artery third order catheter placement             3.  Aortogram and selective angiogram of the right renal artery             4.  Coil embolization of a middle branch of the distal right renal artery.             5.  StarClose closure device left common femoral artery  Anesthesia: Continuous ECG pulse oximetry and cardiopulmonary monitoring was performed throughout the entire procedure by the interventional radiology nurse.  Parenteral Versed  and fentanyl  were administered.  Total sedation time was 46 minutes             Fluoro Time: 7.5 minutes  Contrast: 50 cc              Indications:  Patient is a 89 y.o.female who underwent right renal artery stenting earlier today.  She was initially hemodynamically stable and then approximately 1 hour after the procedure abruptly dropped her pressure.  She was complaining of abdominal pain.  A stat noncontrast CT scan of the abdomen and pelvis was obtained which demonstrated a Perry nephric hematoma.  Angiography with the hope for embolization was recommended. Risks and benefits are discussed and informed consent is obtained.  Procedure:  The patient was identified and appropriate procedural time out was performed.  The patient was then placed supine on the table and prepped and draped in the usual sterile fashion. Moderate conscious sedation was administered during a face to face encounter with the patient throughout the procedure with my supervision of the RN administering medicines and monitoring the  patient's vital signs, pulse oximetry, telemetry and mental status throughout from the start of the procedure until the patient was taken to the recovery room.    Ultrasound was used to evaluate the left common femoral artery.  It was patent .  A digital ultrasound image was acquired.  A Seldinger needle was used to access the left common femoral artery under direct ultrasound guidance and a permanent image was performed.  A 0.035 J wire was advanced without resistance and a 5Fr sheath was placed.  Pigtail catheter was placed into the aorta and an AP aortogram was performed. This demonstrated the right renal artery stent was widely patent and well-positioned.  There was no bleeding identified from the aorta or proximal renal artery.  A faint nephrogram was identified again no extravasation was noted based on this initial image.   A V S1 catheter was used to selectively cannulate the right renal artery through the existing stent.  Hand-injection of contrast was then performed with a magnified view focusing on the kidney in the AP projection this demonstrated a faint blush in the mid to upper pole of the kidney. Based on her likely continued bleeding I elected to treat this area with embolization. I initially advanced the Pro-Great microcatheter out the the distal renal arteries (essentially the right main renal artery trifurcated distally).  Initially I selected the superior branch and advance the Progreat out almost to the hilum image the kidney.  Selective injection using a 3 cc syringe did not identify the previously noted blush.  Based on the initial selective run it was quite clear that the inferior branch was not likely the source of the bleeding.  Therefore, I selected the middle branch and advance the Progreat out essentially to the hilum and hand-injection of contrast using a 3 cc syringe was performed.  This did indeed demonstrate the extravasation previously noted.  Based on this image I selected Ruby  coils initially using a 2 mm x 1 cm soft coil followed by a 2 mm x 4 cm soft coil and then a 2 mm x 2 cm soft coil.  Follow-up imaging initially through the Progreat demonstrated cessation of flow beyond the coils and no evidence of extravasation.  I then removed the Progreat and did a follow-up imaging using 10 cc through the V S1 catheter.  Again no extravasation was noted embolization was successful.   I elected to terminate the procedure. The diagnostic catheter was removed. StarClose closure device was deployed in usual fashion with excellent hemostatic result. The patient was taken to the recovery room in stable condition having tolerated the procedure well.     Findings: Initial images demonstrated the aorta was widely patent as was the right proximal renal artery.  Stent was in good position and widely patent without any evidence of extravasation associated with the site of intervention.  More selective imaging did identify an area in the mid to middle upper area of the kidney and this was selectively embolized using 3 Ruby coils as described above with successful cessation of the bleeding.  Disposition: Patient was taken to the recovery room in stable condition having tolerated the procedure well.  Complications:  None  Cordella Shawl 06/18/2024 7:32 PM   This note was created with Dragon Medical transcription system. Any errors in dictation are purely unintentional.

## 2024-06-18 NOTE — Consult Note (Incomplete)
 "  NAME:  Kim Ballard, MRN:  [id number], DOB:  08-12-1934, LOS: 0 ADMISSION DATE:  06/18/2024, CONSULTATION DATE:  06/18/24 REFERRING MD:  Dr. Jama, CHIEF COMPLAINT:  hypotension   History of Present Illness:  Kim Ballard is an 89 year old female with a past medical history significant for hypertension, bilateral renal artery stenosis, renovascular hypertension, GERD, fibromyalgia and depression who was admitted to [facility] for a scheduled right renal stent placement performed by Dr. Jama.  Vitals on Arrival: ***  Pertinent Labs/Diagnostics: ***  I, Robet Kim, PA-C personally viewed and interpreted this ECG. EKG Interpretation: Date: ***, EKG Time: ***, Rate: ***, Rhythm: ***, QRS Axis:  *** Intervals: ***, ST/T Wave abnormalities: ***, Narrative Interpretation: ***  Chemistry: Na+:***, K+: ***, BUN/Cr.: ***, Serum CO2/ AG: ***  Hematology: WBC: ***, Hgb: ***, Platlets *** Troponin: ***, BNP: ***, Lactic/ PCT: ***, COVID-19 & Influenza A/B: *** ABG: ***  CXR ***: *** CT ***: ***  Medications Administered: ***   PCCM consulted for admission due to hypotension requiring vasopressors.   Pertinent Medical History  Hypertension Type II Diabetes Mellitus Anemia Fibromyalgia Depression  Significant Hospital Events: Including procedures, antibiotic start and stop dates in addition to other pertinent events     Interim History / Subjective:  ***  Objective    Blood pressure (!) 106/54, pulse (!) 109, temperature 98.6 F (37 C), temperature source Axillary, resp. rate 18, height 5' 6 (1.676 m), weight 62.6 kg, SpO2 100%.        Intake/Output Summary (Last 24 hours) at 06/18/2024 2341 Last data filed at 06/18/2024 2319 Gross per 24 hour  Intake 732 ml  Output 215 ml  Net 517 ml   Filed Weights   06/18/24 1224  Weight: 62.6 kg    Examination: General: *** HENT: *** Lungs: *** Cardiovascular: *** Abdomen: *** Extremities: *** Neuro: *** GU:  ***  Resolved problem list   Assessment and Plan   #Hypovolemic/Hemorrhagic Shock secondary to Acute Blood Loss   Labs   CBC: Recent Labs  Lab 06/13/24 0927 06/18/24 1645  WBC 5.1  --   NEUTROABS 3.5  --   HGB 10.1* 6.1*  HCT 30.9* 19.2*  MCV 92.5  --   PLT 179  --     Basic Metabolic Panel: Recent Labs  Lab 06/13/24 0927 06/18/24 1218 06/18/24 1645  NA 141  --  139  K 4.8  --  4.2  CL 106  --  110  CO2 23  --  20*  GLUCOSE 139*  --  281*  BUN 38* 28* 28*  CREATININE 1.28* 1.12* 1.06*  CALCIUM 10.1  --  8.2*   GFR: Estimated Creatinine Clearance: 35 mL/min (A) (by C-G formula based on SCr of 1.06 mg/dL (H)). Recent Labs  Lab 06/13/24 0927  WBC 5.1    Liver Function Tests: No results for input(s): AST, ALT, ALKPHOS, BILITOT, PROT, ALBUMIN in the last 168 hours. No results for input(s): LIPASE, AMYLASE in the last 168 hours. No results for input(s): AMMONIA in the last 168 hours.  ABG    Component Value Date/Time   HCO3 20.5 06/18/2024 2306   ACIDBASEDEF 4.8 (H) 06/18/2024 2306   O2SAT 63.1 06/18/2024 2306     Coagulation Profile: No results for input(s): INR, PROTIME in the last 168 hours.  Cardiac Enzymes: No results for input(s): CKTOTAL, CKMB, CKMBINDEX, TROPONINI in the last 168 hours.  HbA1C: No results found for: HGBA1C  CBG: Recent Labs  Lab  06/18/24 1215 06/18/24 1520 06/18/24 1902 06/18/24 2317  GLUCAP 155* 190* 238* 260*    Review of Systems:   ***  Past Medical History:  She,  has a past medical history of Allergic state, Anemia, Arthritis, Cancer (HCC), Chronic cystitis with hematuria, Complication of anesthesia (10/28/2022), Depression, Diabetes mellitus without complication (HCC), Fibrocystic disease of both breasts, Fibromyalgia, GERD (gastroesophageal reflux disease), Hypertension, IBS (irritable bowel syndrome), Melanoma (HCC) (07/09/2021), Melanoma of neck (HCC) (09/06/2021), Nausea,  Skin cancer, and Wears dentures.   Surgical History:   Past Surgical History:  Procedure Laterality Date   ABDOMINAL HYSTERECTOMY     APPENDECTOMY     BREAST BIOPSY Right 2001   surgical bx    CATARACT EXTRACTION W/PHACO Right 03/08/2017   Procedure: CATARACT EXTRACTION PHACO AND INTRAOCULAR LENS PLACEMENT (IOC) RIGHT DIABETIC TORIC;  Surgeon: Mittie Gaskin, MD;  Location: [facility];  Service: Ophthalmology;  Laterality: Right;  Diabetic - oral meds   CATARACT EXTRACTION W/PHACO Left 04/17/2017   Procedure: CATARACT EXTRACTION PHACO AND INTRAOCULAR LENS PLACEMENT (IOC) LEFT DIABETIC;  Surgeon: Mittie Gaskin, MD;  Location: [facility];  Service: Ophthalmology;  Laterality: Left;  diabetic-oral meds   CHOLECYSTECTOMY     COLONOSCOPY     COLONOSCOPY N/A 10/05/2022   Procedure: COLONOSCOPY;  Surgeon: Toledo, Ladell POUR, MD;  Location: [facility] ENDOSCOPY;  Service: Gastroenterology;  Laterality: N/A;   COLONOSCOPY WITH PROPOFOL  N/A 08/27/2015   Procedure: COLONOSCOPY WITH PROPOFOL ;  Surgeon: Lamar ONEIDA Holmes, MD;  Location: [facility] ENDOSCOPY;  Service: Endoscopy;  Laterality: N/A;   COLONOSCOPY WITH PROPOFOL  N/A 05/02/2016   Procedure: COLONOSCOPY WITH PROPOFOL ;  Surgeon: Lamar ONEIDA Holmes, MD;  Location: [facility] ENDOSCOPY;  Service: Endoscopy;  Laterality: N/A;   ESOPHAGOGASTRODUODENOSCOPY     ESOPHAGOGASTRODUODENOSCOPY N/A 10/05/2022   Procedure: ESOPHAGOGASTRODUODENOSCOPY (EGD);  Surgeon: Toledo, Ladell POUR, MD;  Location: [facility] ENDOSCOPY;  Service: Gastroenterology;  Laterality: N/A;   ESOPHAGOGASTRODUODENOSCOPY (EGD) WITH PROPOFOL  N/A 08/27/2015   Procedure: ESOPHAGOGASTRODUODENOSCOPY (EGD) WITH PROPOFOL ;  Surgeon: Lamar ONEIDA Holmes, MD;  Location: [facility] ENDOSCOPY;  Service: Endoscopy;  Laterality: N/A;   EYE SURGERY     HERNIA REPAIR     IR BONE MARROW BIOPSY & ASPIRATION  10/28/2022   IR KYPHO LUMBAR INC FX REDUCE BONE BX UNI/BIL CANNULATION INC/IMAGING   12/12/2023   IR KYPHO THORACIC WITH BONE BIOPSY  03/19/2024   IR KYPHO THORACIC WITH BONE BIOPSY  04/16/2024   IR RADIOLOGIST EVAL & MGMT  12/05/2023   IR RADIOLOGIST EVAL & MGMT  12/14/2023   IR RADIOLOGIST EVAL & MGMT  01/02/2024   IR RADIOLOGIST EVAL & MGMT  03/14/2024   IR RADIOLOGIST EVAL & MGMT  04/11/2024   MICROLARYNGOSCOPY Right 11/11/2022   Procedure: MICROLARYNGOSCOPY WITH BIOPSY OF PYRIFORM SINUS MASS;  Surgeon: Herminio Miu, MD;  Location: [facility];  Service: ENT;  Laterality: Right;  Diabetic     Social History:   reports that she quit smoking about 66 years ago. Her smoking use included cigarettes. She has never used smokeless tobacco. She reports that she does not drink alcohol and does not use drugs.   Family History:  Her family history includes COPD in her father; Diabetes in her maternal grandmother; Hyperlipidemia in her father; Hypertension in her mother; Kidney disease in her father; Ovarian cancer in her paternal grandmother. There is no history of Breast cancer.   Allergies Allergies[1]   Home Medications  Prior to Admission medications  Medication Sig Start Date End Date Taking? Authorizing  Provider  Ascorbic Acid (VITAMIN C) 1000 MG tablet Take 1,000 mg by mouth 2 (two) times daily.   Yes [provider]  cholecalciferol  (VITAMIN D3) 25 MCG (1000 UT) tablet Take 1,000 Units by mouth 2 (two) times daily.   Yes [provider]  clopidogrel  (PLAVIX ) 75 MG tablet Take 1 tablet (75 mg total) by mouth daily. 06/18/24  Yes Schnier, Cordella MATSU, MD  cyanocobalamin  (VITAMIN B12) 1000 MCG tablet Take 1,000 mcg by mouth daily.   Yes [provider]  glipiZIDE (GLUCOTROL XL) 5 MG 24 hr tablet Take 10 mg by mouth in the morning and at bedtime.   Yes [provider]  hydrochlorothiazide  (HYDRODIURIL ) 25 MG tablet Take 25 mg by mouth daily.   Yes [provider]  losartan (COZAAR) 50 MG tablet Take 50 mg by mouth  daily. Patient taking differently: Take 75 mg by mouth daily. 05/01/24 05/01/25 Yes [provider]  Multiple Vitamins-Minerals (HAIR SKIN & NAILS PO) Take by mouth daily.   Yes [provider]  Multiple Vitamins-Minerals (MULTIVITAMIN WITH MINERALS) tablet Take 1 tablet by mouth daily.   Yes [provider]  Omega 3 1000 MG CAPS Take 1,000 mg by mouth daily.   Yes [provider]  fluticasone (FLONASE) 50 MCG/ACT nasal spray Place 2 sprays into both nostrils daily as needed for allergies.    [provider]  gemfibrozil  (LOPID ) 600 MG tablet Take 600 mg by mouth 2 (two) times daily before a meal.    [provider]  iron  polysaccharides (NIFEREX) 150 MG capsule Take 150 mg by mouth daily.    [provider]  metFORMIN (GLUCOPHAGE-XR) 500 MG 24 hr tablet Take 1,000 mg by mouth 2 (two) times daily. 01/03/24   [provider]  pantoprazole  (PROTONIX ) 40 MG tablet Take 40 mg by mouth daily.    [provider]  Probiotic Product (PROBIOTIC PO) Take by mouth daily.    [provider]  vitamin E 180 MG (400 UNITS) capsule Take 400 Units by mouth daily.    [provider]     Critical care time: ***               [1] Allergies Allergen Reactions   Biaxin [Clarithromycin] Other (See Comments)    GI UPSET    Brompheniramine Maleate    Ceclor [Cefaclor]    Ciprofloxacin    Clinoril [Sulindac]    Codeine Sulfate    Diovan [Valsartan]    Doxycycline     Able to tolerate low dose   Erythromycin    Esomeprazole    Fluoxetine Other (See Comments)    UNKNOWN   Guaifenesin & Derivatives    Levaquin [Levofloxacin In D5w]     Can take in low doses   Lisinopril    Lodine [Etodolac]    Macrodantin [Nitrofurantoin Macrocrystal]    Medrol  [Methylprednisolone ]    Mobic [Meloxicam]    Motrin [Ibuprofen]    Nexium [Esomeprazole Magnesium ]    Nitrofurantoin Other (See  Comments)   Norvasc [Amlodipine Besylate]    Omnicef [Cefdinir] Other (See Comments)    Developed whelps 10 days laster    Penicillins    Prednisone  Nausea And Vomiting    Severe   Prozac [Fluoxetine Hcl] Other (See Comments)    Makes her feel crazy    Reglan [Metoclopramide]    Seldane [Terfenadine]    Sulfa Antibiotics Other (See Comments)   Toprol Xl [Metoprolol Tartrate]    Triamterene  Tussionex Pennkinetic Er [Hydrocod Poli-Chlorphe Poli Er]    Vibramycin [Doxycycline Calcium]    Amlodipine Rash    Other reaction(s): Other (See Comments) Edema Edema   Pseudoephedrine Palpitations  "

## 2024-06-18 NOTE — Progress Notes (Signed)
 Per Dr. Jama orders to titrate NEO by 25 mcg every 2 minutes.Dr. MD has been in room and will be placing central line.

## 2024-06-18 NOTE — Progress Notes (Signed)
 MD notified of H&H: 6.1/. Await orders.

## 2024-06-18 NOTE — Op Note (Signed)
 OPERATIVE NOTE   PROCEDURE: Insertion of triple-lumen central venous catheter right femoral approach. Ultrasound evaluation of the right internal jugular vein  PRE-OPERATIVE DIAGNOSIS:  Hypotensive shock secondary to blood loss Perinephric hematoma status post right renal artery stent angioplasty and stent placement  POST-OPERATIVE DIAGNOSIS: Same  SURGEON: Cordella KANDICE Shawl M.D.  ANESTHESIA: 1% lidocaine  local infiltration  ESTIMATED BLOOD LOSS: Minimal cc  INDICATIONS:   Kim Ballard is a 89 y.o. female who presents with hypotension and shock status post renal artery angioplasty and stent placement.  Venous access is very limited and given her critical life-threatening condition central venous access is indicated.  Risks and benefits were discussed all questions were answered patient has agreed to proceed.  Additionally situation is discussed with the ICU physician and emergency consent is also indicated.  DESCRIPTION: After obtaining full informed written consent, the patient was positioned supine. The right neck was prepped and draped in a sterile fashion. Ultrasound was placed in a sterile sleeve. 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  We then proceeded to expose the right groin.  Right groin was prepped and draped in a sterile fashion.  The right common femoral vein was identified.  It was echolucent and compressible indicating patency. Images recorded for the permanent record. Under real-time visualization a Seldinger needle is inserted into the vein and the guidewires advanced without difficulty. Small counterincision was made at the wire insertion site. Dilator was passed over the wire and the triple-lumen catheter is fed without difficulty.  All 3 lumens aspirate and flush easily and are packed with heparin  saline. Catheter secured to the skin of  the right thigh with 2-0 silk. A sterile dressing is applied with Biopatch.  COMPLICATIONS: None  CONDITION: Good  Cordella KANDICE Shawl, M.D. [facility]. Office:  [phone]   06/18/2024, 7:42 PM

## 2024-06-18 NOTE — Progress Notes (Addendum)
 Called to see patient for low blood pressure.  S: Patient complaining of vague abdominal pain does not seem to be getting worse.  O: Upon arrival to the bedside patient's blood pressure is 91/70.  Post procedure upon arrival in PACU her systolics were greater than 160.  Groin appears clean dry and intact.  CT scan noncontrast was obtained and demonstrates perinephric changes consistent with hematoma.  A: Perinephric hematoma status post right renal artery stent placement  P: I will bring the patient back immediately to special procedures where repeat angiography will be performed to determine whether a bleeding source can be identified if this is a distal branch it will be embolized.  Risks and benefits as well as the indications of been reviewed all questions have been answered and we will proceed immediately.

## 2024-06-19 ENCOUNTER — Telehealth (INDEPENDENT_AMBULATORY_CARE_PROVIDER_SITE_OTHER): Payer: Self-pay | Admitting: Vascular Surgery

## 2024-06-19 ENCOUNTER — Encounter: Payer: Self-pay | Admitting: Vascular Surgery

## 2024-06-19 ENCOUNTER — Inpatient Hospital Stay

## 2024-06-19 DIAGNOSIS — N99840 Postprocedural hematoma of a genitourinary system organ or structure following a genitourinary system procedure: Secondary | ICD-10-CM | POA: Diagnosis not present

## 2024-06-19 DIAGNOSIS — N179 Acute kidney failure, unspecified: Secondary | ICD-10-CM | POA: Diagnosis not present

## 2024-06-19 DIAGNOSIS — R944 Abnormal results of kidney function studies: Secondary | ICD-10-CM | POA: Diagnosis not present

## 2024-06-19 DIAGNOSIS — D72829 Elevated white blood cell count, unspecified: Secondary | ICD-10-CM | POA: Diagnosis not present

## 2024-06-19 DIAGNOSIS — E8721 Acute metabolic acidosis: Secondary | ICD-10-CM

## 2024-06-19 DIAGNOSIS — R571 Hypovolemic shock: Secondary | ICD-10-CM

## 2024-06-19 DIAGNOSIS — E119 Type 2 diabetes mellitus without complications: Secondary | ICD-10-CM

## 2024-06-19 DIAGNOSIS — Z9889 Other specified postprocedural states: Secondary | ICD-10-CM | POA: Diagnosis not present

## 2024-06-19 DIAGNOSIS — S37019A Minor contusion of unspecified kidney, initial encounter: Secondary | ICD-10-CM | POA: Diagnosis not present

## 2024-06-19 DIAGNOSIS — J9601 Acute respiratory failure with hypoxia: Secondary | ICD-10-CM

## 2024-06-19 DIAGNOSIS — Z95828 Presence of other vascular implants and grafts: Secondary | ICD-10-CM | POA: Diagnosis not present

## 2024-06-19 DIAGNOSIS — D62 Acute posthemorrhagic anemia: Secondary | ICD-10-CM

## 2024-06-19 LAB — CBC
HCT: 26.7 % — ABNORMAL LOW (ref 36.0–46.0)
Hemoglobin: 9.4 g/dL — ABNORMAL LOW (ref 12.0–15.0)
MCH: 29.9 pg (ref 26.0–34.0)
MCHC: 35.2 g/dL (ref 30.0–36.0)
MCV: 85 fL (ref 80.0–100.0)
Platelets: 192 K/uL (ref 150–400)
RBC: 3.14 MIL/uL — ABNORMAL LOW (ref 3.87–5.11)
RDW: 16 % — ABNORMAL HIGH (ref 11.5–15.5)
WBC: 17.3 K/uL — ABNORMAL HIGH (ref 4.0–10.5)
nRBC: 0 % (ref 0.0–0.2)

## 2024-06-19 LAB — BASIC METABOLIC PANEL WITH GFR
Anion gap: 9 (ref 5–15)
Anion gap: 9 (ref 5–15)
BUN: 34 mg/dL — ABNORMAL HIGH (ref 8–23)
BUN: 34 mg/dL — ABNORMAL HIGH (ref 8–23)
CO2: 27 mmol/L (ref 22–32)
CO2: 27 mmol/L (ref 22–32)
Calcium: 8.4 mg/dL — ABNORMAL LOW (ref 8.9–10.3)
Calcium: 8.4 mg/dL — ABNORMAL LOW (ref 8.9–10.3)
Chloride: 101 mmol/L (ref 98–111)
Chloride: 100 mmol/L (ref 98–111)
Creatinine, Ser: 1.7 mg/dL — ABNORMAL HIGH (ref 0.44–1.00)
Creatinine, Ser: 1.73 mg/dL — ABNORMAL HIGH (ref 0.44–1.00)
GFR, Estimated: 29 mL/min — ABNORMAL LOW
GFR, Estimated: 28 mL/min — ABNORMAL LOW
Glucose, Bld: 154 mg/dL — ABNORMAL HIGH (ref 70–99)
Glucose, Bld: 165 mg/dL — ABNORMAL HIGH (ref 70–99)
Potassium: 4.3 mmol/L (ref 3.5–5.1)
Potassium: 4.2 mmol/L (ref 3.5–5.1)
Sodium: 137 mmol/L (ref 135–145)
Sodium: 136 mmol/L (ref 135–145)

## 2024-06-19 LAB — PREPARE RBC (CROSSMATCH)

## 2024-06-19 LAB — GLUCOSE, CAPILLARY
Glucose-Capillary: 118 mg/dL — ABNORMAL HIGH (ref 70–99)
Glucose-Capillary: 121 mg/dL — ABNORMAL HIGH (ref 70–99)
Glucose-Capillary: 121 mg/dL — ABNORMAL HIGH (ref 70–99)
Glucose-Capillary: 177 mg/dL — ABNORMAL HIGH (ref 70–99)
Glucose-Capillary: 209 mg/dL — ABNORMAL HIGH (ref 70–99)
Glucose-Capillary: 226 mg/dL — ABNORMAL HIGH (ref 70–99)

## 2024-06-19 LAB — HEMOGLOBIN AND HEMATOCRIT, BLOOD
HCT: 22.3 % — ABNORMAL LOW (ref 36.0–46.0)
HCT: 21.5 % — ABNORMAL LOW (ref 36.0–46.0)
HCT: 19.4 % — ABNORMAL LOW (ref 36.0–46.0)
Hemoglobin: 8 g/dL — ABNORMAL LOW (ref 12.0–15.0)
Hemoglobin: 7.3 g/dL — ABNORMAL LOW (ref 12.0–15.0)
Hemoglobin: 6.7 g/dL — ABNORMAL LOW (ref 12.0–15.0)

## 2024-06-19 LAB — BLOOD GAS, VENOUS
Acid-base deficit: 5.8 mmol/L — ABNORMAL HIGH (ref 0.0–2.0)
Bicarbonate: 18.5 mmol/L — ABNORMAL LOW (ref 20.0–28.0)
O2 Saturation: 69.8 %
Patient temperature: 37
pCO2, Ven: 32 mmHg — ABNORMAL LOW (ref 44–60)
pH, Ven: 7.37 (ref 7.25–7.43)
pO2, Ven: 36 mmHg (ref 32–45)

## 2024-06-19 LAB — HEMOGLOBIN A1C
Hgb A1c MFr Bld: 6.2 % — ABNORMAL HIGH (ref 4.8–5.6)
Mean Plasma Glucose: 131.24 mg/dL

## 2024-06-19 LAB — MRSA NEXT GEN BY PCR, NASAL: MRSA by PCR Next Gen: DETECTED — AB

## 2024-06-19 LAB — PHOSPHORUS: Phosphorus: 4.3 mg/dL (ref 2.5–4.6)

## 2024-06-19 LAB — PROCALCITONIN: Procalcitonin: 0.81 ng/mL

## 2024-06-19 LAB — LACTIC ACID, PLASMA: Lactic Acid, Venous: 1.6 mmol/L (ref 0.5–1.9)

## 2024-06-19 LAB — MAGNESIUM: Magnesium: 2 mg/dL (ref 1.7–2.4)

## 2024-06-19 MED ORDER — SODIUM CHLORIDE 0.9% IV SOLUTION: Freq: Once | INTRAVENOUS | Status: AC

## 2024-06-19 MED ORDER — PANTOPRAZOLE SODIUM 40 MG PO TBEC
40.0000 mg | DELAYED_RELEASE_TABLET | Freq: Every day | ORAL | Status: DC
  Administered 2024-06-19 – 2024-06-23 (×5): 40 mg via ORAL
  Filled 2024-06-19 (×6): qty 1

## 2024-06-19 MED ORDER — LACTATED RINGERS IV SOLN: INTRAVENOUS | Status: DC

## 2024-06-19 MED ORDER — CHLORHEXIDINE GLUCONATE CLOTH 2 % EX PADS
6.0000 | MEDICATED_PAD | Freq: Every day | CUTANEOUS | Status: DC
  Administered 2024-06-19 – 2024-06-20 (×2): 6 via TOPICAL

## 2024-06-19 NOTE — Plan of Care (Signed)
 Patient remains on levo drip for hypotension and sodium bicarbonate  due to a high lactic acid. Patient has received a total of 2 units of blood.   Problem: Activity: Goal: Ability to return to baseline activity level will improve Outcome: Progressing   Problem: Education: Goal: Knowledge of General Education information will improve Description: Including pain rating scale, medication(s)/side effects and non-pharmacologic comfort measures Outcome: Progressing   Problem: Activity: Goal: Risk for activity intolerance will decrease Outcome: Progressing   Problem: Nutrition: Goal: Adequate nutrition will be maintained Outcome: Progressing   Problem: Coping: Goal: Level of anxiety will decrease Outcome: Progressing   Problem: Elimination: Goal: Will not experience complications related to urinary retention Outcome: Progressing   Problem: Pain Managment: Goal: General experience of comfort will improve and/or be controlled Outcome: Progressing   Problem: Safety: Goal: Ability to remain free from injury will improve Outcome: Progressing   Problem: Skin Integrity: Goal: Risk for impaired skin integrity will decrease Outcome: Progressing   Problem: Elimination: Goal: Will not experience complications related to bowel motility Outcome: Not Progressing

## 2024-06-19 NOTE — Evaluation (Signed)
 Physical Therapy Evaluation Patient Details Name: Kim Ballard MRN: [id number] DOB: Mar 02, 1935 Today's Date: 06/19/2024  History of Present Illness  89 y.o female presenting for right renal artery intervention with stenting at the ostia of the right renal artery complicated by a perinephric hematoma with hemorrhagic shock, status post embolization of mid to upper pole bleed.  Clinical Impression  Pt was pleasant and willing to work with PT.  She was a little hesitant to do too much (continues to have R flank pain) but did acknowledge she needs to get up and move.)  She typically does not need AD or assist, but is weaker at this time and had definite need for AD and fatigued much quicker than her baseline but she did not need a lot of assist or cuing.  Pt will benefit from continued PT to address functional limitations.        If plan is discharge home, recommend the following: Assist for transportation;Assistance with cooking/housework;Two people to help with walking and/or transfers;Two people to help with bathing/dressing/bathroom   Can travel by private vehicle        Equipment Recommendations None recommended by PT  Recommendations for Other Services       Functional Status Assessment Patient has had a recent decline in their functional status and demonstrates the ability to make significant improvements in function in a reasonable and predictable amount of time.     Precautions / Restrictions Precautions Precautions: Fall Recall of Precautions/Restrictions: Intact Required Braces or Orthoses:  (L wrist brace from recent fracture) Restrictions Weight Bearing Restrictions Per Provider Order: No      Mobility  Bed Mobility Overal bed mobility: Needs Assistance Bed Mobility: Supine to Sit, Sit to Supine     Supine to sit: Contact guard Sit to supine: Contact guard assist        Transfers Overall transfer level: Needs assistance Equipment used: Rolling walker (2  wheels) Transfers: Sit to/from Stand Sit to Stand: Contact guard assist           General transfer comment: Pt was able to rise to standing w/o direct assist, was reliant on walker for balance    Ambulation/Gait Ambulation/Gait assistance: Contact guard assist Gait Distance (Feet): 35 Feet Assistive device: Rolling walker (2 wheels)         General Gait Details: Pt was able to ambulate with slow but safe cadence, she does not typically need AD but was clearly reliant on it today.  Quick to fatigue.  Stairs            Wheelchair Mobility     Tilt Bed    Modified Rankin (Stroke Patients Only)       Balance Overall balance assessment: Needs assistance Sitting-balance support: No upper extremity supported Sitting balance-Leahy Scale: Normal     Standing balance support: Bilateral upper extremity supported Standing balance-Leahy Scale: Good Standing balance comment: lacking confidence trying to static stand w/o AD, which is her baseline`                             Pertinent Vitals/Pain Pain Assessment Pain Assessment: 0-10 Pain Score: 5  Pain Location: R flank    Home Living Family/patient expects to be discharged to:: Private residence Living Arrangements: Alone Available Help at Discharge: Friend(s) (minimal assist available) Type of Home: House Home Access: Ramped entrance       Home Layout: One level Home Equipment: Agricultural Consultant (2 wheels);Cane -  single point      Prior Function Prior Level of Function : Independent/Modified Independent             Mobility Comments: Pt was driving, running errands, doing all she needed - rarely needing to use a cane ADLs Comments: reports independence with all tasks     Extremity/Trunk Assessment   Upper Extremity Assessment Upper Extremity Assessment: Overall WFL for tasks assessed;Generalized weakness    Lower Extremity Assessment Lower Extremity Assessment: Overall WFL for tasks  assessed;Generalized weakness       Communication   Communication Communication: No apparent difficulties    Cognition Arousal: Alert Behavior During Therapy: WFL for tasks assessed/performed   PT - Cognitive impairments: No apparent impairments                         Following commands: Intact       Cueing       General Comments      Exercises     Assessment/Plan    PT Assessment Patient needs continued PT services  PT Problem List Decreased strength;Decreased range of motion;Decreased activity tolerance;Decreased balance;Decreased mobility;Decreased knowledge of use of DME;Decreased safety awareness;Pain;Cardiopulmonary status limiting activity       PT Treatment Interventions DME instruction;Gait training;Functional mobility training;Therapeutic activities;Therapeutic exercise;Balance training;Patient/family education    PT Goals (Current goals can be found in the Care Plan section)  Acute Rehab PT Goals Patient Stated Goal: go home PT Goal Formulation: With patient Time For Goal Achievement: 07/02/24 Potential to Achieve Goals: Good    Frequency Min 2X/week     Co-evaluation               AM-PAC PT 6 Clicks Mobility  Outcome Measure Help needed turning from your back to your side while in a flat bed without using bedrails?: None Help needed moving from lying on your back to sitting on the side of a flat bed without using bedrails?: A Little Help needed moving to and from a bed to a chair (including a wheelchair)?: A Little Help needed standing up from a chair using your arms (e.g., wheelchair or bedside chair)?: A Little Help needed to walk in hospital room?: A Little Help needed climbing 3-5 steps with a railing? : A Little 6 Click Score: 19    End of Session Equipment Utilized During Treatment: Gait belt Activity Tolerance: Patient tolerated treatment well Patient left: with nursing/sitter in room;in chair;with call bell/phone  within reach Nurse Communication: Mobility status PT Visit Diagnosis: Muscle weakness (generalized) (M62.81);Difficulty in walking, not elsewhere classified (R26.2)    Time: 8857-8797 PT Time Calculation (min) (ACUTE ONLY): 20 min   Charges:   PT Evaluation $PT Eval Low Complexity: 1 Low PT Treatments $Gait Training: 8-22 mins PT General Charges $$ ACUTE PT VISIT: 1 Visit         Carmin JONELLE Deed, DPT 06/19/2024, 1:45 PM

## 2024-06-19 NOTE — Telephone Encounter (Signed)
 Attempted to call and leave a vm for scheduling post op with no success.Follow up with Schnier, Cordella MATSU, MD (Vascular Surgery) in 3 weeks (07/09/2024); follow up after procedure no studies

## 2024-06-19 NOTE — Evaluation (Signed)
 Occupational Therapy Evaluation Patient Details Name: Kim Ballard MRN: [id number] DOB: Dec 11, 1934 Today's Date: 06/19/2024   History of Present Illness   89 y.o female presenting for right renal artery intervention with stenting at the ostia of the right renal artery complicated by a perinephric hematoma with hemorrhagic shock, status post embolization of mid to upper pole bleed.     Clinical Impressions Chart reviewed to date, pt greeted sitting on toilet, agreeable to OT evaluation. PTA pt is MOD I-I in ADL/IADL,amb with no AD. Pt presents with deficits in strength, endurance, activity tolerance, balance, affecting safe and optimal ADL completion. STS completed with supervision from ICU toilet height, amb in room with RW with CGA. MOD A required for peri care following BM. Pt is performing ADL/functional mobility below PLOF, will benefit from acute OT to address functional deficits and to facilitate optimal ADL/functional mobility engagement. Pt is left in bed,all needs met. OT will follow.      If plan is discharge home, recommend the following:   A little help with walking and/or transfers;A little help with bathing/dressing/bathroom;Assist for transportation;Assistance with cooking/housework     Functional Status Assessment   Patient has had a recent decline in their functional status and demonstrates the ability to make significant improvements in function in a reasonable and predictable amount of time.     Equipment Recommendations   BSC/3in1     Recommendations for Other Services         Precautions/Restrictions   Precautions Precautions: Fall Recall of Precautions/Restrictions: Intact Restrictions Weight Bearing Restrictions Per Provider Order: No Other Position/Activity Restrictions: per ortho note on 06/07/24- Ordered fitting for a thumb spica splint for immobilization. - Instructed continuous wear of the splint, except when showering      Mobility  Bed Mobility Overal bed mobility: Needs Assistance Bed Mobility: Sit to Supine       Sit to supine: Min assist        Transfers Overall transfer level: Needs assistance Equipment used: Rolling walker (2 wheels) Transfers: Sit to/from Stand Sit to Stand: Contact guard assist                  Balance Overall balance assessment: Needs assistance Sitting-balance support: Feet supported Sitting balance-Leahy Scale: Good     Standing balance support: Bilateral upper extremity supported, During functional activity, Reliant on assistive device for balance Standing balance-Leahy Scale: Good                             ADL either performed or assessed with clinical judgement   ADL Overall ADL's : Needs assistance/impaired     Grooming: Supervision/safety;Sitting                   Toilet Transfer: Contact guard assist;Rolling walker (2 wheels);Ambulation;Regular Toilet   Toileting- Architect and Hygiene: Moderate assistance;Sit to/from stand Toileting - Clothing Manipulation Details (indicate cue type and reason): continent BM     Functional mobility during ADLs: Contact guard assist;Rolling walker (2 wheels) (approx 10' in room)       Vision Patient Visual Report: No change from baseline       Perception         Praxis         Pertinent Vitals/Pain Pain Assessment Pain Assessment: 0-10 Pain Score: 6  Pain Location: R flank Pain Descriptors / Indicators: Discomfort Pain Intervention(s): Monitored during session, Limited activity within patient's tolerance  Extremity/Trunk Assessment Upper Extremity Assessment Upper Extremity Assessment: Generalized weakness   Lower Extremity Assessment Lower Extremity Assessment: Generalized weakness       Communication Communication Communication: No apparent difficulties   Cognition Arousal: Alert Behavior During Therapy: WFL for tasks assessed/performed Cognition: No  apparent impairments                               Following commands: Intact       Cueing  General Comments   Cueing Techniques: Verbal cues      Exercises Other Exercises Other Exercises: edu re role of OT, role of rehab, safer ADL completion with AE/DME   Shoulder Instructions      Home Living Family/patient expects to be discharged to:: Private residence Living Arrangements: Alone Available Help at Discharge: Friend(s) Type of Home: House Home Access: Ramped entrance     Home Layout: One level     Bathroom Shower/Tub: Producer, Television/film/video: Standard     Home Equipment: Agricultural Consultant (2 wheels);Cane - single point;Grab bars - tub/shower          Prior Functioning/Environment Prior Level of Function : Independent/Modified Independent;Driving;History of Falls (last six months)             Mobility Comments: one fall in the last six months ADLs Comments: reports independence with all tasks    OT Problem List: Decreased strength;Decreased activity tolerance;Decreased knowledge of use of DME or AE;Impaired balance (sitting and/or standing)   OT Treatment/Interventions: Self-care/ADL training;Therapeutic exercise;DME and/or AE instruction;Therapeutic activities;Patient/family education      OT Goals(Current goals can be found in the care plan section)   Acute Rehab OT Goals Patient Stated Goal: improve function OT Goal Formulation: With patient Time For Goal Achievement: 07/03/24 Potential to Achieve Goals: Good ADL Goals Pt Will Perform Grooming: with modified independence;standing;sitting Pt Will Perform Lower Body Dressing: with modified independence;sitting/lateral leans;sit to/from stand Pt Will Transfer to Toilet: with modified independence;ambulating Pt Will Perform Toileting - Clothing Manipulation and hygiene: with modified independence;sitting/lateral leans;sit to/from stand   OT Frequency:  Min 2X/week     Co-evaluation              AM-PAC OT 6 Clicks Daily Activity     Outcome Measure Help from another person eating meals?: None Help from another person taking care of personal grooming?: None Help from another person toileting, which includes using toliet, bedpan, or urinal?: A Little Help from another person bathing (including washing, rinsing, drying)?: A Little Help from another person to put on and taking off regular upper body clothing?: None Help from another person to put on and taking off regular lower body clothing?: A Little 6 Click Score: 21   End of Session Equipment Utilized During Treatment: Rolling walker (2 wheels) Nurse Communication: Mobility status  Activity Tolerance: Patient tolerated treatment well Patient left: in bed;with call bell/phone within reach;with bed alarm set  OT Visit Diagnosis: Other abnormalities of gait and mobility (R26.89);Muscle weakness (generalized) (M62.81)                Time: 8654-8592 OT Time Calculation (min): 22 min Charges:  OT General Charges $OT Visit: 1 Visit OT Evaluation $OT Eval Moderate Complexity: 1 Mod  Therisa Sheffield, OTD OTR/L  06/19/2024, 2:26 PM

## 2024-06-19 NOTE — Progress Notes (Signed)
 [facility]  Daily Progress Note   Subjective  - 1 Day Post-Op  Status post right renal artery intervention with stenting at the ostia of the right renal artery complicated by a perinephric hematoma status post embolization of mid to upper pole bleed.  This morning the patient is awake and alert.  She is appropriate.  She notes significant pain in the right flank.  She is also quite hungry.  Objective Vitals:   06/19/24 0400 06/19/24 0430 06/19/24 0500 06/19/24 0530  BP: (!) 135/53 (!) 121/52 (!) 137/54 (!) 159/61  Pulse: 90 91 86 85  Resp: 12 16 14 14   Temp:   98.7 F (37.1 C)   TempSrc:   Axillary   SpO2: 92% 91% 95% 93%  Weight:      Height:        Intake/Output Summary (Last 24 hours) at 06/19/2024 0738 Last data filed at 06/19/2024 0500 Gross per 24 hour  Intake 2791.13 ml  Output 250 ml  Net 2541.13 ml    PULM  Normal effort , no use of accessory muscles CV  No JVD, RRR Abd      No distended, nontender VASC  groin clean dry and intact  Laboratory CBC    Component Value Date/Time   WBC 17.3 (H) 06/19/2024 0542   HGB 9.4 (L) 06/19/2024 0542   HGB 10.1 (L) 06/13/2024 0927   HCT 26.7 (L) 06/19/2024 0542   PLT 192 06/19/2024 0542   PLT 179 06/13/2024 0927    BMET    Component Value Date/Time   NA 137 06/19/2024 0504   K 4.3 06/19/2024 0504   CL 101 06/19/2024 0504   CO2 27 06/19/2024 0504   GLUCOSE 154 (H) 06/19/2024 0504   BUN 34 (H) 06/19/2024 0504   CREATININE 1.70 (H) 06/19/2024 0504   CREATININE 1.28 (H) 06/13/2024 0927   CALCIUM 8.4 (L) 06/19/2024 0504   GFRNONAA 29 (L) 06/19/2024 0504   GFRNONAA 40 (L) 06/13/2024 0927   GFRAA >60 02/14/2020 1318    Assessment/Planning: POD # 1 s/p right renal artery stenting with secondary embolization  Continue to monitor the renal function as there has been a increase in her creatinine and decrease in her GFR.  I am hopeful this is all related to the perioperative changes and will  recover with time. We can certainly advance her diet and increase her activities. If we can wean her off pressors potentially she could be transferred to to a  [employer]  06/19/2024, 7:38 AM

## 2024-06-19 NOTE — Progress Notes (Signed)
 "  NAME:  Kim Ballard, MRN:  [id number], DOB:  [birth year], LOS: 1 ADMISSION DATE:  06/18/2024, CONSULTATION DATE:  06/18/24 REFERRING MD:  Dr. Jama, CHIEF COMPLAINT:  Hypotension    Brief Pt Description / Synopsis:  89 y.o female presenting for right renal artery intervention with stenting at the ostia of the right renal artery complicated by a perinephric hematoma with hemorrhagic shock, status post embolization of mid to upper pole bleed.   History of Present Illness:  Kim Ballard is an 89 year old female with a past medical history significant for hypertension, bilateral renal artery stenosis, renovascular hypertension, GERD, fibromyalgia and depression who was admitted [facility] for a scheduled right renal stent placement performed by Dr. Jama.   The patient was taken to the Vascular Lab for the procedure. At approximately 3:45pm, Dr. Jama was contacted due to persistently low blood pressure, as well as new onset abdominal pain. Upon Dr. Kathryn arrival at bedside, the patient's blood pressure was 91/70, although post-op BP goal was to keep her SBP >160. Given these changes, an CT abd/pelvis without contrast was obtained and demonstrated perinephric changes consistent with hematoma. It was decided that Dr. Jama would take her back to the OR/special procedures and repeat angiography to determine if a bleeding source could be identified. Prior to returning to the cath lab, the patient was given a 500cc bolus of fluids. Blood pressure continued to decrease, a central line was placed and she was started on a phenylephrine  drip. Hemoglobin was checked, resulted at 6.1. The patient was ordered 2 units pRBCs.    After returning to the cath lab, the stent placed was patent and in good position without evidence of extravasation. There was an area in the mid to middle upper area of the kidney identified as a bleeding source and was embolized. The patient tolerated the procedure well and was  taken to PACU.    Pre-Operative Vitals: temp 98.6, BP 193/73, HR 91, RR 20, SpO2 98% on room air Post- Op BP: 90s/40s   Pertinent Labs/Diagnostics: Following surgery, labs were drawn and she was found to have metabolic acidosis, acute blood loss anemia and hyperglycemia.   Chemistry: Na+:139, K+: 4.2, BUN/Cr.: 28/1.06, Serum CO2/ AG: 20/9, Glucose: 281   Hematology: Hgb: 6.1, HCT: 19.2, aPTT 30 VBG: pH 7.18, pCO2 46, pO2 46, Acid Base Deficit 10.3, Bicarb 17.2   CT Abd/Pelvis wo Impression: 1. Large right renal subcapsular hematoma with extension into the surrounding perinephric and retroperitoneal space. 2. Mild subcutaneous soft tissue stranding in the right inguinal region, likely related to recent vascular access. 3.  Aortic Atherosclerosis   Medications Administered:  NaCl 500mL bolus 2 units pRBCs Zofran  4mg  Phenylephrine    PCCM consulted for admission due to hypotension requiring vasopressors.    Please see Significant Hospital Events section below for full detailed hospital course.   Pertinent  Medical History  Hypertension Type II Diabetes Mellitus Anemia Fibromyalgia Depression  Micro Data:  1/13: MRSA PCR +  Antimicrobials:   Anti-infectives (From admission, onward)    Start     Dose/Rate Route Frequency Ordered Stop   06/18/24 1201  vancomycin  (VANCOCIN ) IVPB 1000 mg/200 mL premix        1,000 mg 200 mL/hr over 60 Minutes Intravenous 60 min pre-op 06/18/24 1202 06/18/24 1900       Significant Hospital Events: Including procedures, antibiotic start and stop dates in addition to other pertinent events   06/18/24: Taken to the cath lab for  a scheduled right renal artery stent placement. Post-operatively became hypotensive requiring vasopressors. CT Abd/Pelvis identified area of the kidney likely to the be the source of the bleeding causing acute hypotension. Hgb checked and resulted at 6.1. Was administered 2 units pRBCs. Was taken back to the cath lab  and the bleed was cauterized. PCCM consulted for admission d/t vasopressor requirements. 06/19/24: Remains on Levophed , weaning down, lactic is now normal.  Received 2 units pRBC's overnight, following H&H q6h.  Advance diet at tolerated, PT/OT consulted. With Leukocytosis, suspect reactive, but will obtain UA and CXR.  Interim History / Subjective:  As outlined above under Significant Hospital Events section  Objective   Blood pressure 126/88, pulse 80, temperature 98.7 F (37.1 C), temperature source Axillary, resp. rate 14, height 5' 6 (1.676 m), weight 62.6 kg, SpO2 94%.        Intake/Output Summary (Last 24 hours) at 06/19/2024 0805 Last data filed at 06/19/2024 0500 Gross per 24 hour  Intake 2791.13 ml  Output 250 ml  Net 2541.13 ml   Filed Weights   06/18/24 1224  Weight: 62.6 kg    Examination: General: Adult female, acutely ill, lying in bed on room air, in NAD HENT: anicteric sclera, atraumatic, normocephalic, neck supple, no JVD CV: RRR, S1 S2, NSR on monitor, no r/m/g Pulm: Regular, non labored on 4L [place] , breath sounds equal throughout, no wheezing/rhonchi/rales GI: soft, non-distended, non- tender, no rebound/guarding, bowel sounds x 4 Extremities: warm/dry, pulses + 2 R/P, no edema Neuro: A&O x 4, follows commands, PERRL GU: foley in place  Resolved Hospital Problem list     Assessment & Plan:   #Shock: Hemorrhagic secondary to acute blood loss due to... #Perinephric hematoma status post embolization of mid to upper pole bleed.  -Continuous cardiac monitoring -Maintain MAP >65 -IV fluids -Transfusions as indicated -Vasopressors as needed to maintain MAP goal -Lactic acid has normalized -Vascular surgery following, appreciate input -Monitor for S/Sx of bleeding -Trend CBC (H&H q6h) -SCD's for VTE Prophylaxis  -Transfuse for Hgb <7  #Metabolic Acidosis due to lactic acidosis #AKI -Monitor I&O's / urinary output -Follow BMP -Ensure adequate renal  perfusion -Avoid nephrotoxic agents as able -Replace electrolytes as indicated ~ Pharmacy following for assistance with electrolyte replacement -Bicarb drip stopped 1/14 -IV fluids  #Leukocytosis, likely reactive -Monitor fever curve -Trend WBC's & Procalcitonin -Follow cultures as above -Will obtain CXR and UA -Will hold off for now on empiric ABX pending cultures & sensitivities, with low threshold to initiate should she become febrile  #Acute Pain s/t Right Renal Artery Stent Hx: Depression - Supportive care - Control pain as able - Dilaudid  0.5-1mg  q2h prn - Morphine  2 mg q1h prn - Delirium precautions   #Acute Hypoxic Respiratory Failure s/t Acute Blood Loss Anemia - Supplemental oxygen to maintain SpO2 > 92% - Currently on 4L nasal cannula - CXR as indicated  #Type II Diabetes Mellitus - ICU hyper/hypoglycemic protocol - CBG Q4h - Goal range 140-180 - SSI: Novolog  - Hold metformin for now    Best Practice (right click and Reselect all SmartList Selections daily)   Diet/type: Regular consistency (see orders) DVT prophylaxis: SCD GI prophylaxis: PPI Lines: Central line and yes and it is still needed Foley:  Yes, and it is still needed Code Status:  full code Last date of multidisciplinary goals of care discussion [1/14]  1/14: Pt updated at bedside on plan of care.  Labs   CBC: Recent Labs  Lab 06/13/24 0927 06/18/24 1645  06/18/24 2306 06/19/24 0542  WBC 5.1  --   --  17.3*  NEUTROABS 3.5  --   --   --   HGB 10.1* 6.1* 10.4* 9.4*  HCT 30.9* 19.2* 30.0* 26.7*  MCV 92.5  --   --  85.0  PLT 179  --   --  192    Basic Metabolic Panel: Recent Labs  Lab 06/13/24 0927 06/18/24 1218 06/18/24 1645 06/19/24 0504  NA 141  --  139 137  K 4.8  --  4.2 4.3  CL 106  --  110 101  CO2 23  --  20* 27  GLUCOSE 139*  --  281* 154*  BUN 38* 28* 28* 34*  CREATININE 1.28* 1.12* 1.06* 1.70*  CALCIUM 10.1  --  8.2* 8.4*  MG  --   --   --  2.0  PHOS  --   --    --  4.3   GFR: Estimated Creatinine Clearance: 21.8 mL/min (A) (by C-G formula based on SCr of 1.7 mg/dL (H)). Recent Labs  Lab 06/13/24 0927 06/18/24 2306 06/19/24 0542  WBC 5.1  --  17.3*  LATICACIDVEN  --  2.3*  --     Liver Function Tests: No results for input(s): AST, ALT, ALKPHOS, BILITOT, PROT, ALBUMIN in the last 168 hours. No results for input(s): LIPASE, AMYLASE in the last 168 hours. No results for input(s): AMMONIA in the last 168 hours.  ABG    Component Value Date/Time   HCO3 18.5 (L) 06/19/2024 0504   ACIDBASEDEF 5.8 (H) 06/19/2024 0504   O2SAT 69.8 06/19/2024 0504     Coagulation Profile: Recent Labs  Lab 06/18/24 2306  INR 1.1    Cardiac Enzymes: No results for input(s): CKTOTAL, CKMB, CKMBINDEX, TROPONINI in the last 168 hours.  HbA1C: Hgb A1c MFr Bld  Date/Time Value Ref Range Status  06/18/2024 11:13 PM 6.2 (H) 4.8 - 5.6 % Final    Comment:    (NOTE) Diagnosis of Diabetes The following HbA1c ranges recommended by the American Diabetes Association (ADA) may be used as an aid in the diagnosis of diabetes mellitus.  Hemoglobin             Suggested A1C NGSP%              Diagnosis  <5.7                   Non Diabetic  5.7-6.4                Pre-Diabetic  >6.4                   Diabetic  <7.0                   Glycemic control for                       adults with diabetes.      CBG: Recent Labs  Lab 06/18/24 1520 06/18/24 1902 06/18/24 2317 06/19/24 0028 06/19/24 0433  GLUCAP 190* 238* 260* 226* 121*    Review of Systems:   Positives in BOLD: Right flank pain  Gen: Denies fever, chills, weight change, fatigue, night sweats HEENT: Denies blurred vision, double vision, hearing loss, tinnitus, sinus congestion, rhinorrhea, sore throat, neck stiffness, dysphagia PULM: Denies shortness of breath, cough, sputum production, hemoptysis, wheezing CV: Denies chest pain, edema, orthopnea, paroxysmal  nocturnal dyspnea, palpitations GI: Denies abdominal pain, nausea,  vomiting, diarrhea, hematochezia, melena, constipation, change in bowel habits GU: Denies dysuria, hematuria, polyuria, oliguria, urethral discharge Endocrine: Denies hot or cold intolerance, polyuria, polyphagia or appetite change Derm: Denies rash, dry skin, scaling or peeling skin change Heme: Denies easy bruising, bleeding, bleeding gums Neuro: Denies headache, numbness, weakness, slurred speech, loss of memory or consciousness   Past Medical History:  She,  has a past medical history of Allergic state, Anemia, Arthritis, Cancer (HCC), Chronic cystitis with hematuria, Complication of anesthesia (10/28/2022), Depression, Diabetes mellitus without complication (HCC), Fibrocystic disease of both breasts, Fibromyalgia, GERD (gastroesophageal reflux disease), Hypertension, IBS (irritable bowel syndrome), Melanoma (HCC) (07/09/2021), Melanoma of neck (HCC) (09/06/2021), Nausea, Skin cancer, and Wears dentures.   Surgical History:   Past Surgical History:  Procedure Laterality Date   ABDOMINAL HYSTERECTOMY     APPENDECTOMY     BREAST BIOPSY Right 2001   surgical bx    CATARACT EXTRACTION W/PHACO Right 03/08/2017   Procedure: CATARACT EXTRACTION PHACO AND INTRAOCULAR LENS PLACEMENT (IOC) RIGHT DIABETIC TORIC;  Surgeon: Mittie Gaskin, MD;  Location: [facility];  Service: Ophthalmology;  Laterality: Right;  Diabetic - oral meds   CATARACT EXTRACTION W/PHACO Left 04/17/2017   Procedure: CATARACT EXTRACTION PHACO AND INTRAOCULAR LENS PLACEMENT (IOC) LEFT DIABETIC;  Surgeon: Mittie Gaskin, MD;  Location: [facility];  Service: Ophthalmology;  Laterality: Left;  diabetic-oral meds   CHOLECYSTECTOMY     COLONOSCOPY     COLONOSCOPY N/A 10/05/2022   Procedure: COLONOSCOPY;  Surgeon: Toledo, Ladell POUR, MD;  Location: [facility];  Service: Gastroenterology;  Laterality: N/A;   COLONOSCOPY WITH PROPOFOL   N/A 08/27/2015   Procedure: COLONOSCOPY WITH PROPOFOL ;  Surgeon: Lamar ONEIDA Holmes, MD;  Location: [facility];  Service: Endoscopy;  Laterality: N/A;   COLONOSCOPY WITH PROPOFOL  N/A 05/02/2016   Procedure: COLONOSCOPY WITH PROPOFOL ;  Surgeon: Lamar ONEIDA Holmes, MD;  Location: [facility];  Service: Endoscopy;  Laterality: N/A;   ESOPHAGOGASTRODUODENOSCOPY     ESOPHAGOGASTRODUODENOSCOPY N/A 10/05/2022   Procedure: ESOPHAGOGASTRODUODENOSCOPY (EGD);  Surgeon: Toledo, Ladell POUR, MD;  Location: [facility];  Service: Gastroenterology;  Laterality: N/A;   ESOPHAGOGASTRODUODENOSCOPY (EGD) WITH PROPOFOL  N/A 08/27/2015   Procedure: ESOPHAGOGASTRODUODENOSCOPY (EGD) WITH PROPOFOL ;  Surgeon: Lamar ONEIDA Holmes, MD;  Location: [facility];  Service: Endoscopy;  Laterality: N/A;   EYE SURGERY     HERNIA REPAIR     IR BONE MARROW BIOPSY & ASPIRATION  10/28/2022   IR KYPHO LUMBAR INC FX REDUCE BONE BX UNI/BIL CANNULATION INC/IMAGING  12/12/2023   IR KYPHO THORACIC WITH BONE BIOPSY  03/19/2024   IR KYPHO THORACIC WITH BONE BIOPSY  04/16/2024   IR RADIOLOGIST EVAL & MGMT  12/05/2023   IR RADIOLOGIST EVAL & MGMT  12/14/2023   IR RADIOLOGIST EVAL & MGMT  01/02/2024   IR RADIOLOGIST EVAL & MGMT  03/14/2024   IR RADIOLOGIST EVAL & MGMT  04/11/2024   MICROLARYNGOSCOPY Right 11/11/2022   Procedure: MICROLARYNGOSCOPY WITH BIOPSY OF PYRIFORM SINUS MASS;  Surgeon: Herminio Miu, MD;  Location: [facility];  Service: ENT;  Laterality: Right;  Diabetic   RENAL ANGIOGRAPHY Bilateral 06/18/2024   Procedure: RENAL ANGIOGRAPHY;  Surgeon: Jama Cordella MATSU, MD;  Location: [facility];  Service: Cardiovascular;  Laterality: Bilateral;   RENAL ANGIOGRAPHY Right 06/18/2024   Procedure: RENAL ANGIOGRAPHY;  Surgeon: Jama Cordella MATSU, MD;  Location: [facility];  Service: Cardiovascular;  Laterality: Right;     Social History:   reports that she quit smoking about 40  years ago. Her smoking use  included cigarettes. She has never used smokeless tobacco. She reports that she does not drink alcohol and does not use drugs.   Family History:  Her family history includes COPD in her father; Diabetes in her maternal grandmother; Hyperlipidemia in her father; Hypertension in her mother; Kidney disease in her father; Ovarian cancer in her paternal grandmother. There is no history of Breast cancer.   Allergies Allergies[1]   Home Medications  Prior to Admission medications  Medication Sig Start Date End Date Taking? Authorizing Provider  Ascorbic Acid (VITAMIN C) 1000 MG tablet Take 1,000 mg by mouth 2 (two) times daily.   Yes [provider]  cholecalciferol  (VITAMIN D3) 25 MCG (1000 UT) tablet Take 1,000 Units by mouth 2 (two) times daily.   Yes [provider]  clopidogrel  (PLAVIX ) 75 MG tablet Take 1 tablet (75 mg total) by mouth daily. 06/18/24  Yes Schnier, Cordella MATSU, MD  cyanocobalamin  (VITAMIN B12) 1000 MCG tablet Take 1,000 mcg by mouth daily.   Yes [provider]  glipiZIDE (GLUCOTROL XL) 5 MG 24 hr tablet Take 10 mg by mouth in the morning and at bedtime.   Yes [provider]  hydrochlorothiazide  (HYDRODIURIL ) 25 MG tablet Take 25 mg by mouth daily.   Yes [provider]  losartan (COZAAR) 50 MG tablet Take 50 mg by mouth daily. Patient taking differently: Take 75 mg by mouth daily. 05/01/24 05/01/25 Yes [provider]  Multiple Vitamins-Minerals (HAIR SKIN & NAILS PO) Take by mouth daily.   Yes [provider]  Multiple Vitamins-Minerals (MULTIVITAMIN WITH MINERALS) tablet Take 1 tablet by mouth daily.   Yes [provider]  Omega 3 1000 MG CAPS Take 1,000 mg by mouth daily.   Yes [provider]  fluticasone (FLONASE) 50 MCG/ACT nasal spray Place 2 sprays into both nostrils daily as needed for allergies.    [provider]  gemfibrozil  (LOPID ) 600 MG tablet Take 600 mg by mouth 2 (two)  times daily before a meal.    [provider]  iron  polysaccharides (NIFEREX) 150 MG capsule Take 150 mg by mouth daily.    [provider]  metFORMIN (GLUCOPHAGE-XR) 500 MG 24 hr tablet Take 1,000 mg by mouth 2 (two) times daily. 01/03/24   [provider]  pantoprazole  (PROTONIX ) 40 MG tablet Take 40 mg by mouth daily.    [provider]  Probiotic Product (PROBIOTIC PO) Take by mouth daily.    [provider]  vitamin E 180 MG (400 UNITS) capsule Take 400 Units by mouth daily.    [provider]     Critical care time: 40 minutes     Inge Lecher, AGACNP-BC [facility] Prefer epic messenger for cross cover needs If after hours, please call E-link       [1]  Allergies Allergen Reactions   Biaxin [Clarithromycin] Other (See Comments)    GI UPSET    Brompheniramine Maleate    Ceclor [Cefaclor]    Ciprofloxacin    Clinoril [Sulindac]    Codeine Sulfate    Diovan [Valsartan]    Doxycycline     Able to tolerate low dose   Erythromycin    Esomeprazole    Fluoxetine Other (See Comments)    UNKNOWN   Guaifenesin & Derivatives    Levaquin [Levofloxacin In D5w]     Can take in low doses   Lisinopril    Lodine [Etodolac]  Macrodantin [Nitrofurantoin Macrocrystal]    Medrol  [Methylprednisolone ]    Mobic [Meloxicam]    Motrin [Ibuprofen]    Nexium [Esomeprazole Magnesium ]    Nitrofurantoin Other (See Comments)   Norvasc [Amlodipine Besylate]    Omnicef [Cefdinir] Other (See Comments)    Developed whelps 10 days laster    Penicillins    Prednisone  Nausea And Vomiting    Severe   Prozac [Fluoxetine Hcl] Other (See Comments)    Makes her feel crazy    Reglan [Metoclopramide]    Seldane [Terfenadine]    Sulfa Antibiotics Other (See Comments)   Toprol Xl [Metoprolol Tartrate]    Triamterene    Tussionex Pennkinetic Er [Hydrocod Poli-Chlorphe Poli Er]    Vibramycin [Doxycycline Calcium]     Amlodipine Rash    Other reaction(s): Other (See Comments) Edema Edema   Pseudoephedrine Palpitations   "

## 2024-06-20 DIAGNOSIS — Z9889 Other specified postprocedural states: Secondary | ICD-10-CM | POA: Diagnosis not present

## 2024-06-20 DIAGNOSIS — Z95828 Presence of other vascular implants and grafts: Secondary | ICD-10-CM | POA: Diagnosis not present

## 2024-06-20 LAB — RENAL FUNCTION PANEL
Albumin: 3.1 g/dL — ABNORMAL LOW (ref 3.5–5.0)
Anion gap: 8 (ref 5–15)
BUN: 32 mg/dL — ABNORMAL HIGH (ref 8–23)
CO2: 27 mmol/L (ref 22–32)
Calcium: 8.2 mg/dL — ABNORMAL LOW (ref 8.9–10.3)
Chloride: 101 mmol/L (ref 98–111)
Creatinine, Ser: 1.73 mg/dL — ABNORMAL HIGH (ref 0.44–1.00)
GFR, Estimated: 28 mL/min — ABNORMAL LOW
Glucose, Bld: 144 mg/dL — ABNORMAL HIGH (ref 70–99)
Phosphorus: 3.9 mg/dL (ref 2.5–4.6)
Potassium: 4 mmol/L (ref 3.5–5.1)
Sodium: 136 mmol/L (ref 135–145)

## 2024-06-20 LAB — CBC
HCT: 22.3 % — ABNORMAL LOW (ref 36.0–46.0)
Hemoglobin: 7.7 g/dL — ABNORMAL LOW (ref 12.0–15.0)
MCH: 30.1 pg (ref 26.0–34.0)
MCHC: 34.5 g/dL (ref 30.0–36.0)
MCV: 87.1 fL (ref 80.0–100.0)
Platelets: 99 K/uL — ABNORMAL LOW (ref 150–400)
RBC: 2.56 MIL/uL — ABNORMAL LOW (ref 3.87–5.11)
RDW: 15.2 % (ref 11.5–15.5)
WBC: 8.1 K/uL (ref 4.0–10.5)
nRBC: 0 % (ref 0.0–0.2)

## 2024-06-20 LAB — URINALYSIS, COMPLETE (UACMP) WITH MICROSCOPIC
Bilirubin Urine: NEGATIVE
Glucose, UA: NEGATIVE mg/dL
Ketones, ur: NEGATIVE mg/dL
Leukocytes,Ua: NEGATIVE
Nitrite: NEGATIVE
Protein, ur: NEGATIVE mg/dL
Specific Gravity, Urine: 1.006 (ref 1.005–1.030)
Squamous Epithelial / HPF: NONE SEEN /HPF (ref 0–5)
pH: 6 (ref 5.0–8.0)

## 2024-06-20 LAB — GLUCOSE, CAPILLARY
Glucose-Capillary: 142 mg/dL — ABNORMAL HIGH (ref 70–99)
Glucose-Capillary: 145 mg/dL — ABNORMAL HIGH (ref 70–99)
Glucose-Capillary: 169 mg/dL — ABNORMAL HIGH (ref 70–99)
Glucose-Capillary: 194 mg/dL — ABNORMAL HIGH (ref 70–99)
Glucose-Capillary: 198 mg/dL — ABNORMAL HIGH (ref 70–99)
Glucose-Capillary: 199 mg/dL — ABNORMAL HIGH (ref 70–99)

## 2024-06-20 LAB — HEMOGLOBIN AND HEMATOCRIT, BLOOD
HCT: 22.3 % — ABNORMAL LOW (ref 36.0–46.0)
Hemoglobin: 7.6 g/dL — ABNORMAL LOW (ref 12.0–15.0)

## 2024-06-20 LAB — MAGNESIUM: Magnesium: 1.8 mg/dL (ref 1.7–2.4)

## 2024-06-20 LAB — PREPARE RBC (CROSSMATCH)

## 2024-06-20 MED ORDER — ACETAMINOPHEN 325 MG PO TABS: 650.0000 mg | ORAL_TABLET | Freq: Four times a day (QID) | ORAL | Status: DC | PRN

## 2024-06-20 MED ORDER — ACETAMINOPHEN 500 MG PO TABS
500.0000 mg | ORAL_TABLET | Freq: Four times a day (QID) | ORAL | Status: DC | PRN
  Administered 2024-06-20 – 2024-06-22 (×4): 500 mg via ORAL
  Filled 2024-06-20 (×5): qty 1

## 2024-06-20 NOTE — Progress Notes (Signed)
 "  NAME:  Kim Ballard, MRN:  [id number], DOB:  1934/10/12, LOS: 2 ADMISSION DATE:  06/18/2024, CONSULTATION DATE:  06/18/24 REFERRING MD:  Dr. Jama, CHIEF COMPLAINT:  Hypotension    Brief Pt Description / Synopsis:  89 y.o female presenting for right renal artery intervention with stenting at the ostia of the right renal artery complicated by a perinephric hematoma with hemorrhagic shock, status post embolization of mid to upper pole bleed.   History of Present Illness:  Kim Ballard is an 89 year old female with a past medical history significant for hypertension, bilateral renal artery stenosis, renovascular hypertension, GERD, fibromyalgia and depression who was admitted to [facility] for a scheduled right renal stent placement performed by Dr. Jama.   The patient was taken to the Vascular Lab for the procedure. At approximately 3:45pm, Dr. Jama was contacted due to persistently low blood pressure, as well as new onset abdominal pain. Upon Dr. Kathryn arrival at bedside, the patient's blood pressure was 91/70, although post-op BP goal was to keep her SBP >160. Given these changes, an CT abd/pelvis without contrast was obtained and demonstrated perinephric changes consistent with hematoma. It was decided that Dr. Jama would take her back to the OR/special procedures and repeat angiography to determine if a bleeding source could be identified. Prior to returning to the cath lab, the patient was given a 500cc bolus of fluids. Blood pressure continued to decrease, a central line was placed and she was started on a phenylephrine  drip. Hemoglobin was checked, resulted at 6.1. The patient was ordered 2 units pRBCs.    After returning to the cath lab, the stent placed was patent and in good position without evidence of extravasation. There was an area in the mid to middle upper area of the kidney identified as a bleeding source and was embolized. The patient tolerated the procedure well and was  taken to PACU.    Pre-Operative Vitals: temp 98.6, BP 193/73, HR 91, RR 20, SpO2 98% on room air Post- Op BP: 90s/40s   Pertinent Labs/Diagnostics: Following surgery, labs were drawn and she was found to have metabolic acidosis, acute blood loss anemia and hyperglycemia.   Chemistry: Na+:139, K+: 4.2, BUN/Cr.: 28/1.06, Serum CO2/ AG: 20/9, Glucose: 281   Hematology: Hgb: 6.1, HCT: 19.2, aPTT 30 VBG: pH 7.18, pCO2 46, pO2 46, Acid Base Deficit 10.3, Bicarb 17.2   CT Abd/Pelvis wo Impression: 1. Large right renal subcapsular hematoma with extension into the surrounding perinephric and retroperitoneal space. 2. Mild subcutaneous soft tissue stranding in the right inguinal region, likely related to recent vascular access. 3.  Aortic Atherosclerosis   Medications Administered:  NaCl 500mL bolus 2 units pRBCs Zofran  4mg  Phenylephrine    PCCM consulted for admission due to hypotension requiring vasopressors.     06/20/24- patient s/p weaning from vasopressors.  Mental status is imrpoved. Has abd pain but is clinically improved.  On ambient air.  Foley catheter can come out. PT/OT is working with patient.  Will move her to stepdown.   Pertinent  Medical History  Hypertension Type II Diabetes Mellitus Anemia Fibromyalgia Depression  Micro Data:  1/13: MRSA PCR +  Antimicrobials:   Anti-infectives (From admission, onward)    Start     Dose/Rate Route Frequency Ordered Stop   06/18/24 1201  vancomycin  (VANCOCIN ) IVPB 1000 mg/200 mL premix        1,000 mg 200 mL/hr over 60 Minutes Intravenous 60 min pre-op 06/18/24 1202 06/18/24 1900  Significant Hospital Events: Including procedures, antibiotic start and stop dates in addition to other pertinent events   06/18/24: Taken to the cath lab for a scheduled right renal artery stent placement. Post-operatively became hypotensive requiring vasopressors. CT Abd/Pelvis identified area of the kidney likely to the be the source of  the bleeding causing acute hypotension. Hgb checked and resulted at 6.1. Was administered 2 units pRBCs. Was taken back to the cath lab and the bleed was cauterized. PCCM consulted for admission d/t vasopressor requirements. 06/19/24: Remains on Levophed , weaning down, lactic is now normal.  Received 2 units pRBC's overnight, following H&H q6h.  Advance diet at tolerated, PT/OT consulted. With Leukocytosis, suspect reactive, but will obtain UA and CXR.  Interim History / Subjective:  As outlined above under Significant Hospital Events section  Objective   Blood pressure (!) 145/64, pulse 98, temperature 99 F (37.2 C), temperature source Axillary, resp. rate 19, height 5' 6 (1.676 m), weight 62.6 kg, SpO2 (!) 89%.        Intake/Output Summary (Last 24 hours) at 06/20/2024 9081 Last data filed at 06/20/2024 0755 Gross per 24 hour  Intake 2482.68 ml  Output 2695 ml  Net -212.32 ml   Filed Weights   06/18/24 1224  Weight: 62.6 kg    Examination: General: Adult female, acutely ill, lying in bed on room air, in NAD HENT: anicteric sclera, atraumatic, normocephalic, neck supple, no JVD CV: RRR, S1 S2, NSR on monitor, no r/m/g Pulm: Regular, non labored on 4L [place] , breath sounds equal throughout, no wheezing/rhonchi/rales GI: soft, non-distended, non- tender, no rebound/guarding, bowel sounds x 4 Extremities: warm/dry, pulses + 2 R/P, no edema Neuro: A&O x 4, follows commands, PERRL GU: foley in place  Resolved Hospital Problem list     Assessment & Plan:   #Shock: Hemorrhagic secondary to acute blood loss due to... #Perinephric hematoma status post embolization of mid to upper pole bleed.  -Continuous cardiac monitoring -Maintain MAP >65 -IV fluids -Transfusions as indicated -Vasopressors as needed to maintain MAP goal -Lactic acid has normalized -Vascular surgery following, appreciate input -Monitor for S/Sx of bleeding -Trend CBC (H&H q6h) -SCD's for VTE Prophylaxis   -Transfuse for Hgb <7  #Metabolic Acidosis due to lactic acidosis #AKI -Monitor I&O's / urinary output -Follow BMP -Ensure adequate renal perfusion -Avoid nephrotoxic agents as able -Replace electrolytes as indicated ~ Pharmacy following for assistance with electrolyte replacement -Bicarb drip stopped 1/14 -IV fluids  #Leukocytosis, likely reactive -Monitor fever curve -Trend WBC's & Procalcitonin -Follow cultures as above -Will obtain CXR and UA -Will hold off for now on empiric ABX pending cultures & sensitivities, with low threshold to initiate should she become febrile  #Acute Pain s/t Right Renal Artery Stent Hx: Depression - Supportive care - Control pain as able - Dilaudid  0.5-1mg  q2h prn - Morphine  2 mg q1h prn - Delirium precautions   #Acute Hypoxic Respiratory Failure s/t Acute Blood Loss Anemia - Supplemental oxygen to maintain SpO2 > 92% - Currently on 4L nasal cannula - CXR as indicated  #Type II Diabetes Mellitus - ICU hyper/hypoglycemic protocol - CBG Q4h - Goal range 140-180 - SSI: Novolog  - Hold metformin for now    Best Practice (right click and Reselect all SmartList Selections daily)   Diet/type: Regular consistency (see orders) DVT prophylaxis: SCD GI prophylaxis: PPI Lines: Central line and yes and it is still needed Foley:  Yes, and it is still needed Code Status:  full code Last date of multidisciplinary goals  of care discussion [1/14]  1/14: Pt updated at bedside on plan of care.  Labs   CBC: Recent Labs  Lab 06/13/24 0927 06/18/24 1645 06/19/24 0542 06/19/24 1125 06/19/24 1825 06/19/24 2006 06/20/24 0317  WBC 5.1  --  17.3*  --   --   --  8.1  NEUTROABS 3.5  --   --   --   --   --   --   HGB 10.1*   < > 9.4* 8.0* 7.3* 6.7* 7.7*  HCT 30.9*   < > 26.7* 22.3* 21.5* 19.4* 22.3*  MCV 92.5  --  85.0  --   --   --  87.1  PLT 179  --  192  --   --   --  99*   < > = values in this interval not displayed.    Basic Metabolic  Panel: Recent Labs  Lab 06/13/24 0927 06/18/24 1218 06/18/24 1645 06/19/24 0504 06/19/24 1125 06/20/24 0317  NA 141  --  139 137 136 136  K 4.8  --  4.2 4.3 4.2 4.0  CL 106  --  110 101 100 101  CO2 23  --  20* 27 27 27   GLUCOSE 139*  --  281* 154* 165* 144*  BUN 38* 28* 28* 34* 34* 32*  CREATININE 1.28* 1.12* 1.06* 1.70* 1.73* 1.73*  CALCIUM 10.1  --  8.2* 8.4* 8.4* 8.2*  MG  --   --   --  2.0  --  1.8  PHOS  --   --   --  4.3  --  3.9   GFR: Estimated Creatinine Clearance: 21.4 mL/min (A) (by C-G formula based on SCr of 1.73 mg/dL (H)). Recent Labs  Lab 06/13/24 0927 06/18/24 2306 06/19/24 0542 06/19/24 1125 06/19/24 2006 06/20/24 0317  PROCALCITON  --   --   --   --  0.81  --   WBC 5.1  --  17.3*  --   --  8.1  LATICACIDVEN  --  2.3*  --  1.6  --   --     Liver Function Tests: Recent Labs  Lab 06/20/24 0317  ALBUMIN 3.1*   No results for input(s): LIPASE, AMYLASE in the last 168 hours. No results for input(s): AMMONIA in the last 168 hours.  ABG    Component Value Date/Time   HCO3 18.5 (L) 06/19/2024 0504   ACIDBASEDEF 5.8 (H) 06/19/2024 0504   O2SAT 69.8 06/19/2024 0504     Coagulation Profile: Recent Labs  Lab 06/18/24 2306  INR 1.1    Cardiac Enzymes: No results for input(s): CKTOTAL, CKMB, CKMBINDEX, TROPONINI in the last 168 hours.  HbA1C: Hgb A1c MFr Bld  Date/Time Value Ref Range Status  06/18/2024 11:13 PM 6.2 (H) 4.8 - 5.6 % Final    Comment:    (NOTE) Diagnosis of Diabetes The following HbA1c ranges recommended by the American Diabetes Association (ADA) may be used as an aid in the diagnosis of diabetes mellitus.  Hemoglobin             Suggested A1C NGSP%              Diagnosis  <5.7                   Non Diabetic  5.7-6.4                Pre-Diabetic  >6.4  Diabetic  <7.0                   Glycemic control for                       adults with diabetes.      CBG: Recent Labs  Lab  06/19/24 1635 06/19/24 1919 06/19/24 2311 06/20/24 0321 06/20/24 0748  GLUCAP 177* 209* 118* 142* 145*    Review of Systems:   Positives in BOLD: Right flank pain  Gen: Denies fever, chills, weight change, fatigue, night sweats HEENT: Denies blurred vision, double vision, hearing loss, tinnitus, sinus congestion, rhinorrhea, sore throat, neck stiffness, dysphagia PULM: Denies shortness of breath, cough, sputum production, hemoptysis, wheezing CV: Denies chest pain, edema, orthopnea, paroxysmal nocturnal dyspnea, palpitations GI: Denies abdominal pain, nausea, vomiting, diarrhea, hematochezia, melena, constipation, change in bowel habits GU: Denies dysuria, hematuria, polyuria, oliguria, urethral discharge Endocrine: Denies hot or cold intolerance, polyuria, polyphagia or appetite change Derm: Denies rash, dry skin, scaling or peeling skin change Heme: Denies easy bruising, bleeding, bleeding gums Neuro: Denies headache, numbness, weakness, slurred speech, loss of memory or consciousness   Past Medical History:  She,  has a past medical history of Allergic state, Anemia, Arthritis, Cancer (HCC), Chronic cystitis with hematuria, Complication of anesthesia (10/28/2022), Depression, Diabetes mellitus without complication (HCC), Fibrocystic disease of both breasts, Fibromyalgia, GERD (gastroesophageal reflux disease), Hypertension, IBS (irritable bowel syndrome), Melanoma (HCC) (07/09/2021), Melanoma of neck (HCC) (09/06/2021), Nausea, Skin cancer, and Wears dentures.   Surgical History:   Past Surgical History:  Procedure Laterality Date   ABDOMINAL HYSTERECTOMY     APPENDECTOMY     BREAST BIOPSY Right 2001   surgical bx    CATARACT EXTRACTION W/PHACO Right 03/08/2017   Procedure: CATARACT EXTRACTION PHACO AND INTRAOCULAR LENS PLACEMENT (IOC) RIGHT DIABETIC TORIC;  Surgeon: Mittie Gaskin, MD;  Location: [facility];  Service: Ophthalmology;  Laterality: Right;  Diabetic  - oral meds   CATARACT EXTRACTION W/PHACO Left 04/17/2017   Procedure: CATARACT EXTRACTION PHACO AND INTRAOCULAR LENS PLACEMENT (IOC) LEFT DIABETIC;  Surgeon: Mittie Gaskin, MD;  Location: [facility];  Service: Ophthalmology;  Laterality: Left;  diabetic-oral meds   CHOLECYSTECTOMY     COLONOSCOPY     COLONOSCOPY N/A 10/05/2022   Procedure: COLONOSCOPY;  Surgeon: Toledo, Ladell POUR, MD;  Location: [facility] ENDOSCOPY;  Service: Gastroenterology;  Laterality: N/A;   COLONOSCOPY WITH PROPOFOL  N/A 08/27/2015   Procedure: COLONOSCOPY WITH PROPOFOL ;  Surgeon: Lamar ONEIDA Holmes, MD;  Location: [facility] END[facility]COPY;  Service: Endoscopy;  Laterality: N/A;   COLONOSCOPY WITH PROPOFOL  N/A 05/02/2016   Procedure: COLONOSCOPY WITH PROPOFOL ;  Surgeon: Lamar ONEIDA Holmes, MD;  Location: [facility] END[facility];  Service: Endoscopy;  Laterality: N/A;   ESOPHAGOGASTRODUODENOSCOPY     ESOPHAGOGASTRODUODENOSCOPY N/A 10/05/2022   Procedure: ESOPHAGOGASTRODUODENOSCOPY (EGD);  Surgeon: Toledo, Ladell POUR, MD;  Location: [facility] ENDOSCOPY;  Service: Gastroenterology;  Laterality: N/A;   ESOPHAGOGASTRODUODENOSCOPY (EGD) WITH PROPOFOL  N/A 08/27/2015   Procedure: ESOPHAGOGASTRODUODENOSCOPY (EGD) WITH PROPOFOL ;  Surgeon: Lamar ONEIDA Holmes, MD;  Location: [facility] ENDOSCOPY;  Service: Endoscopy;  Laterality: N/A;   EYE SURGERY     HERNIA REPAIR     IR BONE MARROW BIOPSY & ASPIRATION  10/28/2022   IR KYPHO LUMBAR INC FX REDUCE BONE BX UNI/BIL CANNULATION INC/IMAGING  12/12/2023   IR KYPHO THORACIC WITH BONE BIOPSY  03/19/2024   IR KYPHO THORACIC WITH BONE BIOPSY  04/16/2024   IR RADIOLOGIST EVAL & MGMT  12/05/2023   IR RADIOLOGIST EVAL & MGMT  12/14/2023   IR RADIOLOGIST EVAL & MGMT  01/02/2024   IR RADIOLOGIST EVAL & MGMT  03/14/2024   IR RADIOLOGIST EVAL & MGMT  04/11/2024   MICROLARYNGOSCOPY Right 11/11/2022   Procedure: MICROLARYNGOSCOPY WITH BIOPSY OF PYRIFORM SINUS MASS;  Surgeon: Herminio Miu, MD;  Location: [facility];   Service: ENT;  Laterality: Right;  Diabetic   RENAL ANGIOGRAPHY Bilateral 06/18/2024   Procedure: RENAL ANGIOGRAPHY;  Surgeon: Jama Cordella MATSU, MD;  Location: [facility] INVASIVE CV LAB;  Service: Cardiovascular;  Laterality: Bilateral;   RENAL ANGIOGRAPHY Right 06/18/2024   Procedure: RENAL ANGIOGRAPHY;  Surgeon: Jama Cordella MATSU, MD;  Location: [facility] INVASIVE CV LAB;  Service: Cardiovascular;  Laterality: Right;     Social History:   reports that she quit smoking about 66 years ago. Her smoking use included cigarettes. She has never used smokeless tobacco. She reports that she does not drink alcohol and does not use drugs.   Family History:  Her family history includes COPD in her father; Diabetes in her maternal grandmother; Hyperlipidemia in her father; Hypertension in her mother; Kidney disease in her father; Ovarian cancer in her paternal grandmother. There is no history of Breast cancer.   Allergies Allergies[1]   Home Medications  Prior to Admission medications  Medication Sig Start Date End Date Taking? Authorizing Provider  Ascorbic Acid (VITAMIN C) 1000 MG tablet Take 1,000 mg by mouth 2 (two) times daily.   Yes [provider]  cholecalciferol  (VITAMIN D3) 25 MCG (1000 UT) tablet Take 1,000 Units by mouth 2 (two) times daily.   Yes [provider]  clopidogrel  (PLAVIX ) 75 MG tablet Take 1 tablet (75 mg total) by mouth daily. 06/18/24  Yes Schnier, Cordella MATSU, MD  cyanocobalamin  (VITAMIN B12) 1000 MCG tablet Take 1,000 mcg by mouth daily.   Yes [provider]  glipiZIDE (GLUCOTROL XL) 5 MG 24 hr tablet Take 10 mg by mouth in the morning and at bedtime.   Yes [provider]  hydrochlorothiazide  (HYDRODIURIL ) 25 MG tablet Take 25 mg by mouth daily.   Yes [provider]  losartan (COZAAR) 50 MG tablet Take 50 mg by mouth daily. Patient taking differently: Take 75 mg by mouth daily. 05/01/24 05/01/25 Yes [provider]  Multiple  Vitamins-Minerals (HAIR SKIN & NAILS PO) Take by mouth daily.   Yes [provider]  Multiple Vitamins-Minerals (MULTIVITAMIN WITH MINERALS) tablet Take 1 tablet by mouth daily.   Yes [provider]  Omega 3 1000 MG CAPS Take 1,000 mg by mouth daily.   Yes [provider]  fluticasone (FLONASE) 50 MCG/ACT nasal spray Place 2 sprays into both nostrils daily as needed for allergies.    [provider]  gemfibrozil  (LOPID ) 600 MG tablet Take 600 mg by mouth 2 (two) times daily before a meal.    [provider]  iron  polysaccharides (NIFEREX) 150 MG capsule Take 150 mg by mouth daily.    [provider]  metFORMIN (GLUCOPHAGE-XR) 500 MG 24 hr tablet Take 1,000 mg by mouth 2 (two) times daily. 01/03/24   [provider]  pantoprazole  (PROTONIX ) 40 MG tablet Take 40 mg by mouth daily.    [provider]  Probiotic Product (PROBIOTIC PO) Take by mouth daily.    [provider]  vitamin E 180 MG (400 UNITS) capsule Take 400 Units by mouth daily.    [provider]     Critical  care provider statement:   Total critical care time: 33 minutes   Performed by: Parris MD   Critical care time was exclusive of separately billable procedures and treating other patients.   Critical care was necessary to treat or prevent imminent or life-threatening deterioration.   Critical care was time spent personally by me on the following activities: development of treatment plan with patient and/or surrogate as well as nursing, discussions with consultants, evaluation of patient's response to treatment, examination of patient, obtaining history from patient or surrogate, ordering and performing treatments and interventions, ordering and review of laboratory studies, ordering and review of radiographic studies, pulse oximetry and re-evaluation of patient's condition.    Anelise Staron, M.D.  Pulmonary & Critical Care Medicine             [1]  Allergies Allergen Reactions   Biaxin [Clarithromycin] Other (See Comments)    GI UPSET    Brompheniramine Maleate    Ceclor [Cefaclor]    Ciprofloxacin    Clinoril [Sulindac]    Codeine Sulfate    Diovan [Valsartan]    Doxycycline     Able to tolerate low dose   Erythromycin    Esomeprazole    Fluoxetine Other (See Comments)    UNKNOWN   Guaifenesin & Derivatives    Levaquin [Levofloxacin In D5w]     Can take in low doses   Lisinopril    Lodine [Etodolac]    Macrodantin [Nitrofurantoin Macrocrystal]    Medrol  [Methylprednisolone ]    Mobic [Meloxicam]    Motrin [Ibuprofen]    Nexium [Esomeprazole Magnesium ]    Nitrofurantoin Other (See Comments)   Norvasc [Amlodipine Besylate]    Omnicef [Cefdinir] Other (See Comments)    Developed whelps 10 days laster    Penicillins    Prednisone  Nausea And Vomiting    Severe   Prozac [Fluoxetine Hcl] Other (See Comments)    Makes her feel crazy    Reglan [Metoclopramide]    Seldane [Terfenadine]    Sulfa Antibiotics Other (See Comments)   Toprol Xl [Metoprolol Tartrate]    Triamterene    Tussionex Pennkinetic Er [Hydrocod Poli-Chlorphe Poli Er]    Vibramycin [Doxycycline Calcium]    Amlodipine Rash    Other reaction(s): Other (See Comments) Edema Edema   Pseudoephedrine Palpitations   "

## 2024-06-20 NOTE — Plan of Care (Signed)

## 2024-06-20 NOTE — Progress Notes (Signed)
 [facility]  Daily Progress Note   Subjective  - 2 Days Post-Op  Patient resting in bed appears comfortable.  She notes pain is better in some positions but is still very bad if she tries to lie on her left side.  She has been out of bed today.  Objective Vitals:   06/20/24 1445 06/20/24 1500 06/20/24 1515 06/20/24 1530  BP: (!) 175/71 (!) 159/59 (!) 157/73 (!) 175/68  Pulse: 93 92 96 (!) 113  Resp: (!) 21 (!) 21 (!) 24 20  Temp:      TempSrc:      SpO2: 100% 100% 100% 92%  Weight:      Height:        Intake/Output Summary (Last 24 hours) at 06/20/2024 1644 Last data filed at 06/20/2024 1404 Gross per 24 hour  Intake 2722.68 ml  Output 2970 ml  Net -247.32 ml    PULM  Normal effort , no use of accessory muscles CV  No JVD, RRR Abd      No distended, nontender VASC  groin clean dry and intact  Laboratory CBC    Component Value Date/Time   WBC 8.1 06/20/2024 0317   HGB 7.6 (L) 06/20/2024 1245   HGB 10.1 (L) 06/13/2024 0927   HCT 22.3 (L) 06/20/2024 1245   PLT 99 (L) 06/20/2024 0317   PLT 179 06/13/2024 0927    BMET    Component Value Date/Time   NA 136 06/20/2024 0317   K 4.0 06/20/2024 0317   CL 101 06/20/2024 0317   CO2 27 06/20/2024 0317   GLUCOSE 144 (H) 06/20/2024 0317   BUN 32 (H) 06/20/2024 0317   CREATININE 1.73 (H) 06/20/2024 0317   CREATININE 1.28 (H) 06/13/2024 0927   CALCIUM 8.2 (L) 06/20/2024 0317   GFRNONAA 28 (L) 06/20/2024 0317   GFRNONAA 40 (L) 06/13/2024 0927   GFRAA >60 02/14/2020 1318    Assessment/Planning: POD #2 s/p  right renal artery stenting with secondary embolization for bleeding  Patient's hemoglobin is stable, her renal function is also stable.  She is okay to transfer to the regular floor we will continue to monitor her blood count and renal function.  Will continue to work with physical therapy.  Hopefully plan discharge Saturday.   Cordella Shawl  06/20/2024, 4:44 PM

## 2024-06-20 NOTE — Progress Notes (Signed)
 Physical Therapy Treatment Patient Details Name: Kim Ballard MRN: [id number] DOB: 07/21/1934 Today's Date: 06/20/2024   History of Present Illness 89 y.o female presenting for right renal artery intervention with stenting at the ostia of the right renal artery complicated by a perinephric hematoma with hemorrhagic shock, status post embolization of mid to upper pole bleed.    PT Comments  Pt reports some fatigue from a busy day (guests recently left, she spent much of the day up in the chair) but was eager to do some activity with PT.  We trialed in room ambulation (~31ft) with walker on room air and pt did quite well.  No LOBs, mild fatigue with SpO2 staying in the 90s.  Pt pleased with the improvement, eager to lay down and get a nap afterward.  Pt will benefit from ongoing PT, continue with POC.     If plan is discharge home, recommend the following: Assist for transportation;Assistance with cooking/housework;Two people to help with walking and/or transfers;Two people to help with bathing/dressing/bathroom   Can travel by private vehicle        Equipment Recommendations  None recommended by PT    Recommendations for Other Services       Precautions / Restrictions Precautions Precautions: Fall Restrictions Weight Bearing Restrictions Per Provider Order: No Other Position/Activity Restrictions: per ortho note on 06/07/24- Ordered fitting for a thumb spica splint for immobilization. - Instructed continuous wear of the splint, except when showering     Mobility  Bed Mobility Overal bed mobility: Needs Assistance Bed Mobility: Sit to Supine     Supine to sit: Contact guard Sit to supine: Min assist   General bed mobility comments: Pt with some guarding 2/2 pain, but showed good effort and need for only light assist to get back up into bed post ambulation    Transfers Overall transfer level: Needs assistance Equipment used: Rolling walker (2 wheels) Transfers: Sit to/from  Stand Sit to Stand: Contact guard assist           General transfer comment: Pt was able to rise to standing w/o direct assist, less reliant on walker for balance today    Ambulation/Gait Ambulation/Gait assistance: Contact guard assist Gait Distance (Feet): 75 Feet Assistive device: Rolling walker (2 wheels)         General Gait Details: Pt with increased confidence during ambulation today, less reliant on the walker though still using the entire time.  Pt on room air with SpO2 remaining in the 90s, mild c/o fatigue.   Stairs             Wheelchair Mobility     Tilt Bed    Modified Rankin (Stroke Patients Only)       Balance Overall balance assessment: Needs assistance Sitting-balance support: Feet supported Sitting balance-Leahy Scale: Good     Standing balance support: Bilateral upper extremity supported, During functional activity, Reliant on assistive device for balance Standing balance-Leahy Scale: Good Standing balance comment: able to maintain static standing w/o UE support today while side stepping along EOB                            Communication Communication Communication: No apparent difficulties  Cognition Arousal: Alert Behavior During Therapy: WFL for tasks assessed/performed   PT - Cognitive impairments: No apparent impairments  Following commands: Intact      Cueing Cueing Techniques: Verbal cues  Exercises      General Comments General comments (skin integrity, edema, etc.): Pt was on 2L O2 on arrival, SpO2 in the high 90s, maintained 90s during in room ambulation - nursing aware      Pertinent Vitals/Pain Pain Assessment Pain Assessment: 0-10 Pain Score: 6  Pain Location: R flank/ribs    Home Living                          Prior Function            PT Goals (current goals can now be found in the care plan section) Progress towards PT goals: Progressing toward  goals    Frequency    Min 2X/week      PT Plan      Co-evaluation              AM-PAC PT 6 Clicks Mobility   Outcome Measure  Help needed turning from your back to your side while in a flat bed without using bedrails?: None Help needed moving from lying on your back to sitting on the side of a flat bed without using bedrails?: A Little Help needed moving to and from a bed to a chair (including a wheelchair)?: A Little Help needed standing up from a chair using your arms (e.g., wheelchair or bedside chair)?: A Little Help needed to walk in hospital room?: A Little Help needed climbing 3-5 steps with a railing? : A Little 6 Click Score: 19    End of Session Equipment Utilized During Treatment: Gait belt Activity Tolerance: Patient tolerated treatment well Patient left: with call bell/phone within reach;with bed alarm set Nurse Communication: Mobility status PT Visit Diagnosis: Muscle weakness (generalized) (M62.81);Difficulty in walking, not elsewhere classified (R26.2)     Time: 8481-8461 PT Time Calculation (min) (ACUTE ONLY): 20 min  Charges:    $Gait Training: 8-22 mins PT General Charges $$ ACUTE PT VISIT: 1 Visit                     Carmin JONELLE Deed, DPT 06/20/2024, 4:58 PM

## 2024-06-20 NOTE — Plan of Care (Signed)
" °  Problem: Education: Goal: Understanding of CV disease, CV risk reduction, and recovery process will improve Outcome: Progressing   Problem: Activity: Goal: Ability to return to baseline activity level will improve Outcome: Progressing   Problem: Cardiovascular: Goal: Ability to achieve and maintain adequate cardiovascular perfusion will improve Outcome: Progressing Goal: Vascular access site(s) Level 0-1 will be maintained Outcome: Progressing   Problem: Health Behavior/Discharge Planning: Goal: Ability to safely manage health-related needs after discharge will improve Outcome: Progressing   Problem: Clinical Measurements: Goal: Ability to maintain clinical measurements within normal limits will improve Outcome: Progressing Goal: Will remain free from infection Outcome: Progressing Goal: Diagnostic test results will improve Outcome: Progressing Goal: Cardiovascular complication will be avoided Outcome: Progressing   Problem: Activity: Goal: Risk for activity intolerance will decrease Outcome: Progressing   "

## 2024-06-20 NOTE — TOC Initial Note (Signed)
 Transition of Care [facility]) - Initial/Assessment Note    Patient Details  Name: Kim Ballard MRN: [id number] Date of Birth: 25-Jul-1934  Transition of [facility]) CM/SW Contact:    Corrie JINNY Ruts, LCSW Phone Number: 06/20/2024, 12:12 PM  Clinical Narrative:                 Chart reviewed. There was a recommendation for Home health PT/OT. I was able to speak with the patient at bedside. I introduced myself, my role, and reason for visit. The patient PCP is Vishwasnath Hande. The patient reports that she lives by herself and all her family is deceased. The patient reports that she was able to complete ADLs independently and drove herself to medical appointments. The patient reports that her church friend will assist her at D/C.   The patient reports that she uses Psychologist, Forensic. The patient reports that she has had home health years ago for her back. The patient was not able to remember the agency. The patient reports that she has never been admitted into a SNF in the past and Is not interested in SNF. The patient was accepting to [facility] services.   The patient consented to [facility] referral being sent into the HUB. The patient reports that she has a cane, walker, and ramp.   There are no more ICM need. SW will send out referral for [facility].     Barriers to Discharge: Continued Medical Work up   Patient Goals and CMS Choice     Choice offered to / list presented to : Patient      Expected Discharge Plan and Services       Living arrangements for the past 2 months: Single Family Home Expected Discharge Date: 06/18/24                                    Prior Living Arrangements/Services Living arrangements for the past 2 months: Single Family Home Lives with:: Self Patient language and need for interpreter reviewed:: Yes        Need for Family Participation in Patient Care: Yes (Comment)     Criminal Activity/Legal Involvement Pertinent to Current Situation/Hospitalization: No -  Comment as needed  Activities of Daily Living   ADL Screening (condition at time of admission) Independently performs ADLs?: Yes (appropriate for developmental age) Is the patient deaf or have difficulty hearing?: No Does the patient have difficulty seeing, even when wearing glasses/contacts?: No Does the patient have difficulty concentrating, remembering, or making decisions?: No  Permission Sought/Granted                  Emotional Assessment Appearance:: Appears stated age Attitude/Demeanor/Rapport: Gracious Affect (typically observed): Calm Orientation: : Oriented to Self, Oriented to  Time, Oriented to Situation, Oriented to Place Alcohol / Substance Use: Not Applicable Psych Involvement: No (comment)  Admission diagnosis:  Renal hypertension [I12.9] Perinephric hematoma [S37.019A] Patient Active Problem List   Diagnosis Date Noted   Perinephric hematoma 06/18/2024   Renovascular hypertension 05/23/2024   Renal artery stenosis 05/23/2024   Anemia due to stage 3a chronic kidney disease (HCC) 11/04/2022   Hypertension 07/26/2022   Depression 07/26/2022   Diabetes mellitus without complication (HCC) 05/23/2022   Hypokalemia 05/23/2022   Right hip pain 05/23/2022   Hypomagnesemia 05/23/2022   Swelling of left lower extremity 11/19/2021   Melanoma of neck (HCC) 07/01/2021   Moderate episode of recurrent  major depressive disorder (HCC) 11/05/2020   Irritable bowel syndrome with constipation and diarrhea 11/05/2020   Sensorineural hearing loss (SNHL) of both ears 07/29/2020   Osteopenia 01/30/2020   Neuropathic pain of both feet 01/30/2020   Melanoma of back (HCC) 01/30/2020   Iron  deficiency 08/13/2019   Recurrent UTI 07/01/2019   Hx of adenomatous colonic polyps 01/22/2019   Esophageal dysphagia 01/21/2019   Fatigue 09/18/2017   Leukopenia 04/26/2016   Pancytopenia (HCC) 04/26/2016   Mesenteric artery stenosis 02/19/2016   Hyperlipidemia 02/19/2016   Diabetes  mellitus (HCC) 02/19/2016   Iron  deficiency anemia 02/08/2016   Symptomatic anemia 01/18/2016   Thrombocytopenia 01/18/2016   Gastroesophageal reflux disease without esophagitis 07/30/2015   Urge incontinence 05/14/2015   Other chronic cystitis without hematuria 05/14/2015   PCP:  Sadie Manna, MD Pharmacy:   [facility] [address] ([address]), [place] - [address] [place]) [place] [address] Phone: [phone] Fax: [phone]     Social Drivers of Health (SDOH) Social History: SDOH Screenings   Food Insecurity: No Food Insecurity (06/18/2024)  Housing: Low Risk (06/18/2024)  Transportation Needs: Unmet Transportation Needs (06/18/2024)  Utilities: Not At Risk (06/18/2024)  Depression (PHQ2-9): Low Risk (06/13/2024)  Financial Resource Strain: Low Risk  (06/07/2024)   Received from [facility]  Social Connections: Socially Isolated (06/18/2024)  Tobacco Use: Medium Risk (06/18/2024)   SDOH Interventions:     Readmission Risk Interventions     No data to display

## 2024-06-21 DIAGNOSIS — Z95828 Presence of other vascular implants and grafts: Secondary | ICD-10-CM | POA: Diagnosis not present

## 2024-06-21 DIAGNOSIS — Z9889 Other specified postprocedural states: Secondary | ICD-10-CM | POA: Diagnosis not present

## 2024-06-21 DIAGNOSIS — K5900 Constipation, unspecified: Secondary | ICD-10-CM

## 2024-06-21 DIAGNOSIS — R109 Unspecified abdominal pain: Secondary | ICD-10-CM

## 2024-06-21 LAB — CBC
HCT: 21.9 % — ABNORMAL LOW (ref 36.0–46.0)
Hemoglobin: 7.3 g/dL — ABNORMAL LOW (ref 12.0–15.0)
MCH: 29.7 pg (ref 26.0–34.0)
MCHC: 33.3 g/dL (ref 30.0–36.0)
MCV: 89 fL (ref 80.0–100.0)
Platelets: 91 K/uL — ABNORMAL LOW (ref 150–400)
RBC: 2.46 MIL/uL — ABNORMAL LOW (ref 3.87–5.11)
RDW: 14.8 % (ref 11.5–15.5)
WBC: 8.2 K/uL (ref 4.0–10.5)
nRBC: 0 % (ref 0.0–0.2)

## 2024-06-21 LAB — RENAL FUNCTION PANEL
Albumin: 3.1 g/dL — ABNORMAL LOW (ref 3.5–5.0)
Anion gap: 7 (ref 5–15)
BUN: 28 mg/dL — ABNORMAL HIGH (ref 8–23)
CO2: 28 mmol/L (ref 22–32)
Calcium: 8.7 mg/dL — ABNORMAL LOW (ref 8.9–10.3)
Chloride: 100 mmol/L (ref 98–111)
Creatinine, Ser: 1.57 mg/dL — ABNORMAL HIGH (ref 0.44–1.00)
GFR, Estimated: 32 mL/min — ABNORMAL LOW
Glucose, Bld: 155 mg/dL — ABNORMAL HIGH (ref 70–99)
Phosphorus: 3 mg/dL (ref 2.5–4.6)
Potassium: 3.9 mmol/L (ref 3.5–5.1)
Sodium: 136 mmol/L (ref 135–145)

## 2024-06-21 LAB — GLUCOSE, CAPILLARY
Glucose-Capillary: 129 mg/dL — ABNORMAL HIGH (ref 70–99)
Glucose-Capillary: 135 mg/dL — ABNORMAL HIGH (ref 70–99)
Glucose-Capillary: 137 mg/dL — ABNORMAL HIGH (ref 70–99)
Glucose-Capillary: 209 mg/dL — ABNORMAL HIGH (ref 70–99)
Glucose-Capillary: 242 mg/dL — ABNORMAL HIGH (ref 70–99)
Glucose-Capillary: 257 mg/dL — ABNORMAL HIGH (ref 70–99)

## 2024-06-21 LAB — MAGNESIUM: Magnesium: 1.8 mg/dL (ref 1.7–2.4)

## 2024-06-21 MED ORDER — MAGNESIUM HYDROXIDE 400 MG/5ML PO SUSP
30.0000 mL | Freq: Every day | ORAL | Status: DC | PRN
  Administered 2024-06-21: 30 mL via ORAL
  Filled 2024-06-21: qty 30

## 2024-06-21 MED ORDER — MAGNESIUM HYDROXIDE 400 MG/5ML PO SUSP: 15.0000 mL | Freq: Once | ORAL | Status: DC

## 2024-06-21 MED ORDER — POLYETHYLENE GLYCOL 3350 17 G PO PACK
17.0000 g | PACK | Freq: Every day | ORAL | Status: DC
  Administered 2024-06-21 – 2024-06-22 (×2): 17 g via ORAL
  Filled 2024-06-21 (×3): qty 1

## 2024-06-21 MED ORDER — OXYCODONE HCL 5 MG PO TABS: 5.0000 mg | ORAL_TABLET | Freq: Four times a day (QID) | ORAL | 0 refills | Status: AC | PRN

## 2024-06-21 MED ORDER — FUROSEMIDE 10 MG/ML IJ SOLN
40.0000 mg | Freq: Once | INTRAMUSCULAR | Status: AC
  Administered 2024-06-21: 40 mg via INTRAVENOUS
  Filled 2024-06-21: qty 4

## 2024-06-21 NOTE — Interval H&P Note (Signed)
 History and Physical Interval Note:  06/21/2024 3:56 PM  Kim Ballard  has presented today for surgery, with the diagnosis of Bilateral renal angio   Renal vascular hypertension.  The various methods of treatment have been discussed with the patient and family. After consideration of risks, benefits and other options for treatment, the patient has consented to  Procedures: RENAL ANGIOGRAPHY (Bilateral) as a surgical intervention.  The patient's history has been reviewed, patient examined, no change in status, stable for surgery.  I have reviewed the patient's chart and labs.  Questions were answered to the patient's satisfaction.     Cordella Shawl

## 2024-06-21 NOTE — Progress Notes (Signed)
 " PROGRESS NOTE    Kim Ballard  FMW:[id number] DOB: [birth year] DOA: 06/18/2024 PCP: Sadie Manna, MD    Brief Narrative:   89 year old female with a past medical history significant for hypertension, bilateral renal artery stenosis, renovascular hypertension, GERD, fibromyalgia and depression who was admitted to [facility] for a scheduled right renal stent placement performed by Dr. Jama.   06/18/24: right renal artery stent placement. Post-operatively became hypotensive requiring vasopressors. CT Abd/Pelvis identified area of the kidney likely to the be the source of the bleeding causing acute hypotension. Hgb checked and resulted at 6.1. Was administered 2 units pRBCs. Was taken back to the cath lab and the bleed was cauterized. PCCM consulted for admission d/t vasopressor requirements. 06/19/24: Remains on Levophed , weaning down, lactic is now normal.  Received 2 units pRBC's overnight, following H&H q6h.  Advance diet at tolerated, PT/OT consulted. With Leukocytosis, suspect reactive, but will obtain UA and CXR. 06/20/24- patient s/p weaning from vasopressors. Mental status is imrpoved. Has abd pain but is clinically improved. On ambient air. Foley catheter can come out. PT/OT is working with patient. Will move her to stepdown.  06/21/24: transferred out of ICU, picked by TRH. Lasix  40 mg IV once as some hypoxia reported by nursing   Assessment & Plan:   Principal Problem:   Perinephric hematoma   #Shock: Hemorrhagic secondary to acute blood loss due to... #Perinephric hematoma status post embolization of mid to upper pole bleed.  -stable H & H -Transfuse for Hgb <7   #Metabolic Acidosis due to lactic acidosis #AKI -improving with hydration.  -Bicarb drip stopped 1/14   #Leukocytosis, likely reactive -no need for abx as no s/s of infection   #Acute Pain s/t Right Renal Artery Stent Continue Oxycodone  prn   #Acute Hypoxic Respiratory Failure s/t Acute Blood Loss Anemia -  Supplemental oxygen to maintain SpO2 > 88% - sats 92-95% at rest but drops to 82-87% when sleeping. Could have underlying sleep apnea - will give Lasix  40 mg IV once as she is 2.8 Liters + - may need one more dose of Lasix  tomorrow   #Type II Diabetes Mellitus - Goal range 140-180 - SSI: Novolog  - Hold metformin for now  # Constipation On Miralax , last BM documented on 1/16   DVT prophylaxis: SCD's      Code Status: (Full code Family Communication: (none at bedside Disposition Plan: (possible DC in next 1-2 days per primary team    Procedures:  right renal artery stent placement on 1/13     Subjective:  Reports some abd pain but overall feels well  Objective: Vitals:   06/21/24 0559 06/21/24 0747 06/21/24 0755 06/21/24 1635  BP:  (!) 143/53  (!) 162/65  Pulse:  91  98  Resp:  18  17  Temp: 99.6 F (37.6 C) 99.4 F (37.4 C)  99.4 F (37.4 C)  TempSrc:      SpO2:  (!) 87% 95% 92%  Weight:      Height:        Intake/Output Summary (Last 24 hours) at 06/21/2024 1802 Last data filed at 06/21/2024 1401 Gross per 24 hour  Intake 603 ml  Output --  Net 603 ml   Filed Weights   06/18/24 1224  Weight: 62.6 kg    Examination:  General exam: Appears calm and comfortable  Respiratory system: Clear to auscultation. Respiratory effort normal. Cardiovascular system: S1 & S2 heard, RRR. No murmurs. No pedal edema. Gastrointestinal system: Abdomen is  soft, benign Central nervous system: Alert and oriented. No focal neurological deficits. Extremities: Symmetric 5 x 5 power. Skin: No rashes, lesions or ulcers Psychiatry: Judgement and insight appear normal. Mood & affect appropriate.     Data Reviewed: I have personally reviewed following labs and imaging studies  CBC: Recent Labs  Lab 06/19/24 0542 06/19/24 1125 06/19/24 1825 06/19/24 2006 06/20/24 0317 06/20/24 1245 06/21/24 0527  WBC 17.3*  --   --   --  8.1  --  8.2  HGB 9.4*   < > 7.3* 6.7* 7.7*  7.6* 7.3*  HCT 26.7*   < > 21.5* 19.4* 22.3* 22.3* 21.9*  MCV 85.0  --   --   --  87.1  --  89.0  PLT 192  --   --   --  99*  --  91*   < > = values in this interval not displayed.   Basic Metabolic Panel: Recent Labs  Lab 06/18/24 1645 06/19/24 0504 06/19/24 1125 06/20/24 0317 06/21/24 0527  NA 139 137 136 136 136  K 4.2 4.3 4.2 4.0 3.9  CL 110 101 100 101 100  CO2 20* 27 27 27 28   GLUCOSE 281* 154* 165* 144* 155*  BUN 28* 34* 34* 32* 28*  CREATININE 1.06* 1.70* 1.73* 1.73* 1.57*  CALCIUM 8.2* 8.4* 8.4* 8.2* 8.7*  MG  --  2.0  --  1.8 1.8  PHOS  --  4.3  --  3.9 3.0   GFR: Estimated Creatinine Clearance: 23.6 mL/min (A) (by C-G formula based on SCr of 1.57 mg/dL (H)). Liver Function Tests: Recent Labs  Lab 06/20/24 0317 06/21/24 0527  ALBUMIN 3.1* 3.1*   No results for input(s): LIPASE, AMYLASE in the last 168 hours. No results for input(s): AMMONIA in the last 168 hours. Coagulation Profile: Recent Labs  Lab 06/18/24 2306  INR 1.1   Cardiac Enzymes: No results for input(s): CKTOTAL, CKMB, CKMBINDEX, TROPONINI in the last 168 hours. BNP (last 3 results) No results for input(s): PROBNP in the last 8760 hours. HbA1C: Recent Labs    06/18/24 2313  HGBA1C 6.2*   CBG: Recent Labs  Lab 06/20/24 2331 06/21/24 0355 06/21/24 0747 06/21/24 1122 06/21/24 1636  GLUCAP 199* 137* 135* 242* 257*    Sepsis Labs: Recent Labs  Lab 06/18/24 2306 06/19/24 1125 06/19/24 2006  PROCALCITON  --   --  0.81  LATICACIDVEN 2.3* 1.6  --     Recent Results (from the past 240 hours)  MRSA Next Gen by PCR, Nasal     Status: Abnormal   Collection Time: 06/18/24 11:16 PM   Specimen: Nasal Mucosa; Nasal Swab  Result Value Ref Range Status   MRSA by PCR Next Gen DETECTED (A) NOT DETECTED Final    Comment: RESULT CALLED TO, READ BACK BY AND VERIFIED WITH: NALLELY CALDERON RN @0123  06/19/24 ASW (NOTE) The GeneXpert MRSA Assay (FDA approved for NASAL  specimens only), is one component of a comprehensive MRSA colonization surveillance program. It is not intended to diagnose MRSA infection nor to guide or monitor treatment for MRSA infections. Test performance is not FDA approved in patients less than 64 years old. Performed at [facility], [address] [place], [place] [address]         Scheduled Meds:  insulin  aspart  0-20 Units Subcutaneous Q4H   pantoprazole   40 mg Oral Daily   polyethylene glycol  17 g Oral Daily   sodium chloride  flush  3 mL Intravenous  Q12H   sodium chloride  flush  3 mL Intravenous Q12H   Continuous Infusions:   LOS: 3 days    Time spent: 35 mins    Samyak Sackmann Maree, MD [facility] Pager [phone]  If 7PM-7AM, please contact night-coverage [email or web address]  06/21/2024, 6:02 PM   "

## 2024-06-21 NOTE — Progress Notes (Signed)
 Occupational Therapy Treatment Patient Details Name: Kim Ballard MRN: [id number] DOB: 03-Jul-1934 Today's Date: 06/21/2024   History of present illness 89 y.o female presenting for right renal artery intervention with stenting at the ostia of the right renal artery complicated by a perinephric hematoma with hemorrhagic shock, status post embolization of mid to upper pole bleed.   OT comments  Pt is supine in bed on arrival. Pleasant and agreeable to OT session. She reports R flank pain, but is tolerable. Pt performed bed mobility with CGA, increased time/effort but no physical assist. Pt required CGA for LB dressing and progressed functional mobility ~190 ft using RW with no LOB and CGA/SBA. Pt's sp02 on RA 89% following mobility with quick improvement to 93% with PLB/rest. Pt returned to bed with all needs in place and will cont to require skilled acute OT services to maximize [name].       If plan is discharge home, recommend the following:  A little help with walking and/or transfers;A little help with bathing/dressing/bathroom;Assist for transportation;Assistance with cooking/housework   Equipment Recommendations  BSC/3in1    Recommendations for Other Services      Precautions / Restrictions Precautions Precautions: Fall Recall of Precautions/Restrictions: Intact Required Braces or Orthoses:  (L hand/wrist brace) Restrictions Weight Bearing Restrictions Per Provider Order: No Other Position/Activity Restrictions: per ortho note on 06/07/24- Ordered fitting for a thumb spica splint for immobilization. - Instructed continuous wear of the splint, except when showering       Mobility Bed Mobility Overal bed mobility: Needs Assistance Bed Mobility: Sit to Supine, Supine to Sit     Supine to sit: Contact guard Sit to supine: Contact guard assist   General bed mobility comments: increased time/effort, no physical assist    Transfers Overall  transfer level: Needs assistance Equipment used: Rolling walker (2 wheels) Transfers: Sit to/from Stand Sit to Stand: Contact guard assist           General transfer comment: able to stand from EOB and progress mobility ~190 ft using RW without LOB     Balance Overall balance assessment: Needs assistance Sitting-balance support: Feet supported Sitting balance-Leahy Scale: Good     Standing balance support: Bilateral upper extremity supported, During functional activity, Reliant on assistive device for balance Standing balance-Leahy Scale: Good Standing balance comment: RW                           ADL either performed or assessed with clinical judgement   ADL Overall ADL's : Needs assistance/impaired                     Lower Body Dressing: Contact guard assist;Sitting/lateral leans;Sit to/from stand Lower Body Dressing Details (indicate cue type and reason): donn mesh underwear             Functional mobility during ADLs: Contact guard assist;Rolling walker (2 wheels);Supervision/safety      Extremity/Trunk Assessment              Occupational Psychologist Communication: No apparent difficulties   Cognition Arousal: Alert Behavior During Therapy: WFL for tasks assessed/performed Cognition: No apparent impairments                               Following  commands: Intact        Cueing   Cueing Techniques: Verbal cues  Exercises      Shoulder Instructions       General Comments desat to 89-90% on RA after ambulation    Pertinent Vitals/ Pain       Pain Assessment Pain Assessment: Faces Faces Pain Scale: Hurts little more Pain Location: R flank/ribs Pain Descriptors / Indicators: Discomfort Pain Intervention(s): Monitored during session, Limited activity within patient's tolerance, Repositioned  Home Living                                           Prior Functioning/Environment              Frequency  Min 2X/week        Progress Toward Goals  OT Goals(current goals can now be found in the care plan section)  Progress towards OT goals: Progressing toward goals  Acute Rehab OT Goals Patient Stated Goal: get better OT Goal Formulation: With patient Time For Goal Achievement: 07/03/24 Potential to Achieve Goals: Good  Plan      Co-evaluation                 AM-PAC OT 6 Clicks Daily Activity     Outcome Measure   Help from another person eating meals?: None Help from another person taking care of personal grooming?: None Help from another person toileting, which includes using toliet, bedpan, or urinal?: A Little Help from another person bathing (including washing, rinsing, drying)?: A Little Help from another person to put on and taking off regular upper body clothing?: None Help from another person to put on and taking off regular lower body clothing?: A Little 6 Click Score: 21    End of Session Equipment Utilized During Treatment: Rolling walker (2 wheels)  OT Visit Diagnosis: Other abnormalities of gait and mobility (R26.89);Muscle weakness (generalized) (M62.81)   Activity Tolerance Patient tolerated treatment well   Patient Left in bed;with call bell/phone within reach;with bed alarm set   Nurse Communication Mobility status        Time: 8554-8491 OT Time Calculation (min): 23 min  Charges: OT General Charges $OT Visit: 1 Visit OT Treatments $Self Care/Home Management : 8-22 mins $Therapeutic Exercise: 8-22 mins  Gerda Yin Chrismon, OTR/L  06/21/24, 3:55 PM   Raydin Bielinski E Chrismon 06/21/2024, 3:50 PM

## 2024-06-21 NOTE — Progress Notes (Signed)
 [facility]  Daily Progress Note   Subjective  - 3 Days Post-Op  Patient doing better today.  She notes that she walked around the nursing station and was very happy with this.  Her flank/abdominal pain is still present but seems to be better.  She is concerned about needing oxygen at night  Objective Vitals:   06/21/24 0559 06/21/24 0747 06/21/24 0755 06/21/24 1635  BP:  (!) 143/53  (!) 162/65  Pulse:  91  98  Resp:  18  17  Temp: 99.6 F (37.6 C) 99.4 F (37.4 C)  99.4 F (37.4 C)  TempSrc:      SpO2:  (!) 87% 95% 92%  Weight:      Height:        Intake/Output Summary (Last 24 hours) at 06/21/2024 1746 Last data filed at 06/21/2024 1401 Gross per 24 hour  Intake 603 ml  Output --  Net 603 ml    PULM  Normal effort , no use of accessory muscles CV  No JVD, RRR Abd      No distended, nontender VASC  groin clean dry and intact  Laboratory CBC    Component Value Date/Time   WBC 8.2 06/21/2024 0527   HGB 7.3 (L) 06/21/2024 0527   HGB 10.1 (L) 06/13/2024 0927   HCT 21.9 (L) 06/21/2024 0527   PLT 91 (L) 06/21/2024 0527   PLT 179 06/13/2024 0927    BMET    Component Value Date/Time   NA 136 06/21/2024 0527   K 3.9 06/21/2024 0527   CL 100 06/21/2024 0527   CO2 28 06/21/2024 0527   GLUCOSE 155 (H) 06/21/2024 0527   BUN 28 (H) 06/21/2024 0527   CREATININE 1.57 (H) 06/21/2024 0527   CREATININE 1.28 (H) 06/13/2024 0927   CALCIUM 8.7 (L) 06/21/2024 0527   GFRNONAA 32 (L) 06/21/2024 0527   GFRNONAA 40 (L) 06/13/2024 0927   GFRAA >60 02/14/2020 1318    Assessment/Planning: POD # 3 s/p right renal artery stent placement with Abran nephric hematoma  Patient is doing better.  Possible discharge for tomorrow but I would say more likely Sunday.  Given her complaint of constipation I will order milk of magnesia with warm prune juice.  Continue to check labs.  I have discussed her desaturations at night and agree with a single dose of Lasix  given  her positive fluid balance.  Face-to-face has been done.   Cordella Shawl  06/21/2024, 5:46 PM

## 2024-06-21 NOTE — TOC Progression Note (Signed)
 Transition of Care [facility]) - Progression Note    Patient Details  Name: Kim Ballard MRN: [id number] Date of Birth: 05/18/1935  [facility]) CM/SW Contact  Corean ONEIDA Haddock, RN Phone Number: 06/21/2024, 2:46 PM  Clinical Narrative:     Followed up with patient about HH preference.  CMS Medicare.gov Compare Post Acute Care list reviewed with patient.  She accepts [facility].  Accepted in HUB and notified Cory with [facility]  - patient declined [facility]  -if O2 indicated will need qualifying sats and order     Barriers to Discharge: Continued Medical Work up               Expected Discharge Plan and Services       Living arrangements for the past 2 months: Single Family Home Expected Discharge Date: 06/18/24                                     Social Drivers of Health (SDOH) Interventions SDOH Screenings   Food Insecurity: No Food Insecurity (06/18/2024)  Housing: Low Risk (06/18/2024)  Transportation Needs: Unmet Transportation Needs (06/18/2024)  Utilities: Not At Risk (06/18/2024)  Depression (PHQ2-9): Low Risk (06/13/2024)  Financial Resource Strain: Low Risk  (06/07/2024)   Received from [facility]  Social Connections: Socially Isolated (06/18/2024)  Tobacco Use: Medium Risk (06/18/2024)    Readmission Risk Interventions     No data to display

## 2024-06-21 NOTE — Care Management Important Message (Signed)
 Important Message  Patient Details  Name: Kim Ballard MRN: [id number] Date of Birth: 1935-05-02   Important Message Given:  Yes - Medicare IM     Shamyra Farias W, CMA 06/21/2024, 12:21 PM

## 2024-06-22 DIAGNOSIS — Z9889 Other specified postprocedural states: Secondary | ICD-10-CM

## 2024-06-22 DIAGNOSIS — Z95828 Presence of other vascular implants and grafts: Secondary | ICD-10-CM

## 2024-06-22 DIAGNOSIS — N99840 Postprocedural hematoma of a genitourinary system organ or structure following a genitourinary system procedure: Secondary | ICD-10-CM

## 2024-06-22 LAB — TYPE AND SCREEN
ABO/RH(D): A POS
Antibody Screen: NEGATIVE
Unit division: 0
Unit division: 0
Unit division: 0
Unit division: 0
Unit division: 0
Unit division: 0
Unit division: 0
Unit division: 0
Unit division: 0
Unit division: 0
Unit division: 0

## 2024-06-22 LAB — BPAM RBC
Blood Product Expiration Date: 202601202359
Blood Product Expiration Date: 202601202359
Blood Product Expiration Date: 202601202359
Blood Product Expiration Date: 202601252359
Blood Product Expiration Date: 202602072359
Blood Product Expiration Date: 202602072359
Blood Product Expiration Date: 202602082359
Blood Product Expiration Date: 202602132359
Blood Product Expiration Date: 202602132359
Blood Product Expiration Date: 202602132359
Blood Product Expiration Date: 202602132359
ISSUE DATE / TIME: 202601131830
ISSUE DATE / TIME: 202601132006
ISSUE DATE / TIME: 202601140441
ISSUE DATE / TIME: 202601141017
ISSUE DATE / TIME: 202601141453
ISSUE DATE / TIME: 202601142331
ISSUE DATE / TIME: 202601151501
ISSUE DATE / TIME: 202601160840
ISSUE DATE / TIME: 202601161333
Unit Type and Rh: 5100
Unit Type and Rh: 5100
Unit Type and Rh: 600
Unit Type and Rh: 600
Unit Type and Rh: 6200
Unit Type and Rh: 6200
Unit Type and Rh: 6200
Unit Type and Rh: 6200
Unit Type and Rh: 6200
Unit Type and Rh: 6200
Unit Type and Rh: 6200

## 2024-06-22 LAB — RENAL FUNCTION PANEL
Albumin: 3.3 g/dL — ABNORMAL LOW (ref 3.5–5.0)
Anion gap: 10 (ref 5–15)
BUN: 29 mg/dL — ABNORMAL HIGH (ref 8–23)
CO2: 28 mmol/L (ref 22–32)
Calcium: 9.2 mg/dL (ref 8.9–10.3)
Chloride: 98 mmol/L (ref 98–111)
Creatinine, Ser: 1.59 mg/dL — ABNORMAL HIGH (ref 0.44–1.00)
GFR, Estimated: 31 mL/min — ABNORMAL LOW
Glucose, Bld: 148 mg/dL — ABNORMAL HIGH (ref 70–99)
Phosphorus: 2.7 mg/dL (ref 2.5–4.6)
Potassium: 4.2 mmol/L (ref 3.5–5.1)
Sodium: 136 mmol/L (ref 135–145)

## 2024-06-22 LAB — GLUCOSE, CAPILLARY
Glucose-Capillary: 122 mg/dL — ABNORMAL HIGH (ref 70–99)
Glucose-Capillary: 125 mg/dL — ABNORMAL HIGH (ref 70–99)
Glucose-Capillary: 150 mg/dL — ABNORMAL HIGH (ref 70–99)
Glucose-Capillary: 154 mg/dL — ABNORMAL HIGH (ref 70–99)
Glucose-Capillary: 172 mg/dL — ABNORMAL HIGH (ref 70–99)
Glucose-Capillary: 176 mg/dL — ABNORMAL HIGH (ref 70–99)
Glucose-Capillary: 231 mg/dL — ABNORMAL HIGH (ref 70–99)

## 2024-06-22 LAB — CBC
HCT: 23.7 % — ABNORMAL LOW (ref 36.0–46.0)
Hemoglobin: 7.9 g/dL — ABNORMAL LOW (ref 12.0–15.0)
MCH: 29.9 pg (ref 26.0–34.0)
MCHC: 33.3 g/dL (ref 30.0–36.0)
MCV: 89.8 fL (ref 80.0–100.0)
Platelets: 96 K/uL — ABNORMAL LOW (ref 150–400)
RBC: 2.64 MIL/uL — ABNORMAL LOW (ref 3.87–5.11)
RDW: 14.4 % (ref 11.5–15.5)
WBC: 7.3 K/uL (ref 4.0–10.5)
nRBC: 0 % (ref 0.0–0.2)

## 2024-06-22 NOTE — Plan of Care (Signed)
  Problem: Education: Goal: Understanding of CV disease, CV risk reduction, and recovery process will improve Outcome: Progressing Goal: Individualized Educational Video(s) Outcome: Progressing   Problem: Activity: Goal: Ability to return to baseline activity level will improve Outcome: Progressing   Problem: Cardiovascular: Goal: Ability to achieve and maintain adequate cardiovascular perfusion will improve Outcome: Progressing Goal: Vascular access site(s) Level 0-1 will be maintained Outcome: Progressing   Problem: Health Behavior/Discharge Planning: Goal: Ability to safely manage health-related needs after discharge will improve Outcome: Progressing   Problem: Education: Goal: Knowledge of General Education information will improve Description: Including pain rating scale, medication(s)/side effects and non-pharmacologic comfort measures Outcome: Progressing   Problem: Health Behavior/Discharge Planning: Goal: Ability to manage health-related needs will improve Outcome: Progressing   Problem: Clinical Measurements: Goal: Ability to maintain clinical measurements within normal limits will improve Outcome: Progressing Goal: Will remain free from infection Outcome: Progressing Goal: Diagnostic test results will improve Outcome: Progressing Goal: Respiratory complications will improve Outcome: Progressing Goal: Cardiovascular complication will be avoided Outcome: Progressing   Problem: Activity: Goal: Risk for activity intolerance will decrease Outcome: Progressing   Problem: Nutrition: Goal: Adequate nutrition will be maintained Outcome: Progressing   Problem: Coping: Goal: Level of anxiety will decrease Outcome: Progressing   Problem: Elimination: Goal: Will not experience complications related to bowel motility Outcome: Progressing Goal: Will not experience complications related to urinary retention Outcome: Progressing   Problem: Pain Managment: Goal:  General experience of comfort will improve and/or be controlled Outcome: Progressing   Problem: Safety: Goal: Ability to remain free from injury will improve Outcome: Progressing   Problem: Skin Integrity: Goal: Risk for impaired skin integrity will decrease Outcome: Progressing   Problem: Education: Goal: Ability to describe self-care measures that may prevent or decrease complications (Diabetes Survival Skills Education) will improve Outcome: Progressing Goal: Individualized Educational Video(s) Outcome: Progressing   Problem: Coping: Goal: Ability to adjust to condition or change in health will improve Outcome: Progressing   Problem: Fluid Volume: Goal: Ability to maintain a balanced intake and output will improve Outcome: Progressing   Problem: Health Behavior/Discharge Planning: Goal: Ability to identify and utilize available resources and services will improve Outcome: Progressing Goal: Ability to manage health-related needs will improve Outcome: Progressing   Problem: Metabolic: Goal: Ability to maintain appropriate glucose levels will improve Outcome: Progressing   Problem: Nutritional: Goal: Maintenance of adequate nutrition will improve Outcome: Progressing Goal: Progress toward achieving an optimal weight will improve Outcome: Progressing   Problem: Skin Integrity: Goal: Risk for impaired skin integrity will decrease Outcome: Progressing   Problem: Tissue Perfusion: Goal: Adequacy of tissue perfusion will improve Outcome: Progressing

## 2024-06-22 NOTE — Progress Notes (Signed)
 " PROGRESS NOTE    Kim Ballard  FMW:[id number] DOB: [birth year] DOA: 06/18/2024 PCP: Kim Manna, MD    Brief Narrative:   89 year old female with a past medical history significant for hypertension, bilateral renal artery stenosis, renovascular hypertension, GERD, fibromyalgia and depression who was admitted to [facility] for a scheduled right renal stent placement performed by Dr. Jama.   06/18/24: right renal artery stent placement. Post-operatively became hypotensive requiring vasopressors. CT Abd/Pelvis identified area of the kidney likely to the be the source of the bleeding causing acute hypotension. Hgb checked and resulted at 6.1. Was administered 2 units pRBCs. Was taken back to the cath lab and the bleed was cauterized. PCCM consulted for admission d/t vasopressor requirements. 06/19/24: Remains on Levophed , weaning down, lactic is now normal.  Received 2 units pRBC's overnight, following H&H q6h.  Advance diet at tolerated, PT/OT consulted. With Leukocytosis, suspect reactive, but will obtain UA and CXR. 06/20/24- patient s/p weaning from vasopressors. Mental status is imrpoved. Has abd pain but is clinically improved. On ambient air. Foley catheter can come out. PT/OT is working with patient. Will move her to stepdown.  06/21/24: transferred out of ICU, picked by TR[facility]. Lasix  40 mg IV once as some hypoxia reported by nursing 06/22/24: Given milk of magnesia overnight   Assessment & Plan:   Principal Problem:   Perinephric hematoma   #Shock: Hemorrhagic secondary to acute blood loss due to... #Perinephric hematoma status post embolization of mid to upper pole bleed.  -stable H & H -Transfuse for Hgb <7   #Metabolic Acidosis due to lactic acidosis #AKI -improving with hydration.  -Bicarb drip stopped 1/14   #Leukocytosis, likely reactive -no need for abx as no s/s of infection   #Acute Pain s/t Right Renal Artery Stent Continue Oxycodone  prn   #Acute Hypoxic Respiratory  Failure s/t Acute Blood Loss Anemia - Supplemental oxygen to maintain SpO2 > 88% - sats 92-95% at rest but drops to 82-87% when sleeping. Could have underlying sleep apnea.  Recheck oxygen saturations today, she may not need oxygen at discharge - Status post one-time dose of Lasix  40 mg IV yesterday as she was 2.8 L positive   #Type II Diabetes Mellitus - Goal range 140-180 - SSI: Novolog  - Hold metformin for now.  Resume at discharge  # Constipation On Miralax , last BM documented on 1/16.  Added milk of magnesia overnight   DVT prophylaxis: SCD's Place and maintain sequential compression device Start: 06/21/24 1821     Code Status: Full code Family Communication: none at bedside Disposition Plan: Patient is medically stable so will sign off.  Will defer discharge planning to the primary team.  Discussed with Dr. Laurence    Procedures:  right renal artery stent placement on 1/13     Subjective:  No new issues.  Feels fine  Objective: Vitals:   06/21/24 2043 06/22/24 0415 06/22/24 0725 06/22/24 1016  BP: (!) 164/73 135/61 130/68 (!) 139/53  Pulse: (!) 109 (!) 101 100 88  Resp: 16 16 16 16   Temp: 99.3 F (37.4 C) 99 F (37.2 C) 98.7 F (37.1 C) 98.4 F (36.9 C)  TempSrc: Oral Oral Oral   SpO2: 99% 91% 94% 92%  Weight:      Height:        Intake/Output Summary (Last 24 hours) at 06/22/2024 1321 Last data filed at 06/22/2024 0000 Gross per 24 hour  Intake 960 ml  Output --  Net 960 ml   Kim Ballard  Weights   06/18/24 1224  Weight: 62.6 kg    Examination:  General exam: Appears calm and comfortable  Respiratory system: Clear to auscultation. Respiratory effort normal. Cardiovascular system: S1 & S2 heard, RRR. No murmurs. No pedal edema. Gastrointestinal system: Abdomen is soft, benign Central nervous system: Alert and oriented. No focal neurological deficits. Extremities: Symmetric 5 x 5 power. Skin: No rashes, lesions or ulcers Psychiatry: Judgement and  insight appear normal. Mood & affect appropriate.     Data Reviewed: I have personally reviewed following labs and imaging studies  CBC: Recent Labs  Lab 06/19/24 0542 06/19/24 1125 06/19/24 2006 06/20/24 0317 06/20/24 1245 06/21/24 0527 06/22/24 0520  WBC 17.3*  --   --  8.1  --  8.2 7.3  HGB 9.4*   < > 6.7* 7.7* 7.6* 7.3* 7.9*  HCT 26.7*   < > 19.4* 22.3* 22.3* 21.9* 23.7*  MCV 85.0  --   --  87.1  --  89.0 89.8  PLT 192  --   --  99*  --  91* 96*   < > = values in this interval not displayed.   Basic Metabolic Panel: Recent Labs  Lab 06/19/24 0504 06/19/24 1125 06/20/24 0317 06/21/24 0527 06/22/24 0520  NA 137 136 136 136 136  K 4.3 4.2 4.0 3.9 4.2  CL 101 100 101 100 98  CO2 27 27 27 28 28   GLUCOSE 154* 165* 144* 155* 148*  BUN 34* 34* 32* 28* 29*  CREATININE 1.70* 1.73* 1.73* 1.57* 1.59*  CALCIUM 8.4* 8.4* 8.2* 8.7* 9.2  MG 2.0  --  1.8 1.8  --   PHOS 4.3  --  3.9 3.0 2.7   GFR: Estimated Creatinine Clearance: 23.3 mL/min (A) (by C-G formula based on SCr of 1.59 mg/dL (H)). Liver Function Tests: Recent Labs  Lab 06/20/24 0317 06/21/24 0527 06/22/24 0520  ALBUMIN 3.1* 3.1* 3.3*   No results for input(s): LIPASE, AMYLASE in the last 168 hours. No results for input(s): AMMONIA in the last 168 hours. Coagulation Profile: Recent Labs  Lab 06/18/24 2306  INR 1.1   Cardiac Enzymes: No results for input(s): CKTOTAL, CKMB, CKMBINDEX, TROPONINI in the last 168 hours. BNP (last 3 results) No results for input(s): PROBNP in the last 8760 hours. HbA1C: No results for input(s): HGBA1C in the last 72 hours.  CBG: Recent Labs  Lab 06/21/24 2342 06/22/24 0412 06/22/24 0730 06/22/24 1151 06/22/24 1211  GLUCAP 209* 125* 122* 154* 150*    Sepsis Labs: Recent Labs  Lab 06/18/24 2306 06/19/24 1125 06/19/24 2006  PROCALCITON  --   --  0.81  LATICACIDVEN 2.3* 1.6  --     Recent Results (from the past 240 hours)  MRSA Next Gen by  PCR, Nasal     Status: Abnormal   Collection Time: 06/18/24 11:16 PM   Specimen: Nasal Mucosa; Nasal Swab  Result Value Ref Range Status   MRSA by PCR Next Gen DETECTED (A) NOT DETECTED Final    Comment: RESULT CALLED TO, READ BACK BY AND VERIFIED WITH: NALLELY CALDERON RN @0123  06/19/24 ASW (NOTE) The GeneXpert MRSA Assay (FDA approved for NASAL specimens only), is one component of a comprehensive MRSA colonization surveillance program. It is not intended to diagnose MRSA infection nor to guide or monitor treatment for MRSA infections. Test performance is not FDA approved in patients less than 76 years old. Performed at [facility], [address] [place], [place] [address]  Scheduled Meds:  insulin  aspart  0-20 Units Subcutaneous Q4H   magnesium  hydroxide  15 mL Oral Once   pantoprazole   40 mg Oral Daily   polyethylene glycol  17 g Oral Daily   sodium chloride  flush  3 mL Intravenous Q12H   sodium chloride  flush  3 mL Intravenous Q12H   Continuous Infusions:   LOS: 4 days    Time spent: 35 mins    Irma Delancey Maree, MD [facility] Pager [phone]  If 7PM-7AM, please contact night-coverage [email or web address]  06/22/2024, 1:21 PM   "

## 2024-06-22 NOTE — Progress Notes (Signed)
 Physical Therapy Treatment Patient Details Name: Kim Ballard MRN: [id number] DOB: [birth year] Today's Date: 06/22/2024   History of Present Illness 89 y.o female presenting for right renal artery intervention with stenting at the ostia of the right renal artery complicated by a perinephric hematoma with hemorrhagic shock, status post embolization of mid to upper pole bleed.    PT Comments  Patient found supine in bed with pastor and family friend present in room. Patient reports soreness in L rib/flank region but agreeable to therapy services. Patient completed bed mobility and modified independence with usage of bed rails to perform supine to EOB. Patient able to complete sts from EOB to rolling walker at CGA. Patient completed ambulation with rolling walker at [facility] for 170 feet with patient displaying no apparent gait deviations and maintain 02 sats on room air between 88-92%. Patient maintaining conversing during ambulation but reports moderate SOB during ambulation. Patient also stated L rid/flank pain reducing once standing and ambulating. Patient returned to EOB to supine at modified independence with 02 sats at 94%. Patient left supine in bed with bed alarm set and call bell in close proximity to patient.    If plan is discharge home, recommend the following: Assist for transportation;Assistance with cooking/housework;Two people to help with walking and/or transfers;Two people to help with bathing/dressing/bathroom   Can travel by private vehicle        Equipment Recommendations       Recommendations for Other Services       Precautions / Restrictions Precautions Precautions: Fall Recall of Precautions/Restrictions: Intact Restrictions Weight Bearing Restrictions Per Provider Order: No Other Position/Activity Restrictions: per ortho note on 06/07/24- Ordered fitting for a thumb spica splint for immobilization. - Instructed continuous wear of the splint, except when showering      Mobility  Bed Mobility Overal bed mobility: Modified Independent Bed Mobility: Sit to Supine, Supine to Sit     Supine to sit: Modified independent (Device/Increase time) (requires hand rail support) Sit to supine: Modified independent (Device/Increase time)   General bed mobility comments: increased time/effort, no physical assist    Transfers   Equipment used: Rolling walker (2 wheels) Transfers: Sit to/from Stand Sit to Stand: Contact guard assist                Ambulation/Gait Ambulation/Gait assistance: Contact guard assist Gait Distance (Feet): 170 Feet Assistive device: Rolling walker (2 wheels) Gait Pattern/deviations: WFL(Within Functional Limits)       General Gait Details:  (Patient complete ambulation for 170 feet with no LOB using RW, 02 sats on room teetering between 88-92 on room air with patient able to converse during ambulation.)   Stairs             Wheelchair Mobility     Tilt Bed    Modified Rankin (Stroke Patients Only)       Balance Overall balance assessment: Needs assistance Sitting-balance support: Feet supported Sitting balance-Leahy Scale: Good     Standing balance support: Bilateral upper extremity supported                                Communication Communication Communication: No apparent difficulties  Cognition Arousal: Alert Behavior During Therapy: WFL for tasks assessed/performed   PT - Cognitive impairments: No apparent impairments                         Following commands:  Intact      Cueing Cueing Techniques: Verbal cues  Exercises      General Comments        Pertinent Vitals/Pain Pain Assessment Pain Assessment: 0-10 Pain Score: 5  Pain Location: L flank/ribs Pain Descriptors / Indicators: Discomfort Pain Intervention(s): Monitored during session    Home Living                          Prior Function            PT Goals (current goals can  now be found in the care plan section) Acute Rehab PT Goals Patient Stated Goal: go home PT Goal Formulation: With patient Potential to Achieve Goals: Good    Frequency    Min 2X/week      PT Plan      Co-evaluation              AM-PAC PT 6 Clicks Mobility   Outcome Measure  Help needed turning from your back to your side while in a flat bed without using bedrails?: None Help needed moving from lying on your back to sitting on the side of a flat bed without using bedrails?: A Little Help needed moving to and from a bed to a chair (including a wheelchair)?: A Little Help needed standing up from a chair using your arms (e.g., wheelchair or bedside chair)?: A Little Help needed to walk in hospital room?: A Little Help needed climbing 3-5 steps with a railing? : A Little 6 Click Score: 19    End of Session   Activity Tolerance: Patient tolerated treatment well   Nurse Communication: Mobility status PT Visit Diagnosis: Muscle weakness (generalized) (M62.81);Difficulty in walking, not elsewhere classified (R26.2)     Time: 8492-8479 PT Time Calculation (min) (ACUTE ONLY): 13 min  Charges:    $Gait Training: 8-22 mins PT General Charges $$ ACUTE PT VISIT: 1 Visit                     Waddell Lesches, PTA

## 2024-06-22 NOTE — Progress Notes (Signed)
" °  °  °  Daily Progress Note   Assessment/Planning:   POD #4 s/p R RAS -> perinephric hematoma -> coil embolization of middle branch of distal R renal artery  Stable H/H and stable Cr Still uncomfortable so will keep hospitalized per pt's wishes Will see if ready tomorrow   Subjective  - 4 Days Post-Op   Still uncomfortable moving around   Objective   Vitals:   06/21/24 1949 06/21/24 2043 06/22/24 0415 06/22/24 0725  BP: (!) 169/67 (!) 164/73 135/61 130/68  Pulse: (!) 110 (!) 109 (!) 101 100  Resp: 16 16 16 16   Temp: 98.5 F (36.9 C) 99.3 F (37.4 C) 99 F (37.2 C) 98.7 F (37.1 C)  TempSrc: Oral Oral Oral Oral  SpO2: 93% 99% 91% 94%  Weight:      Height:         Intake/Output Summary (Last 24 hours) at 06/22/2024 0908 Last data filed at 06/22/2024 0000 Gross per 24 hour  Intake 1200 ml  Output --  Net 1200 ml    PULM  CTAB  CV  RRR  GI  soft, NTND, R flank TTP  VASC B groin: bandages still in place, no hematoma  NEURO A&O x 3, MAE spont    Laboratory   CBC    Latest Ref Rng & Units 06/22/2024    5:20 AM 06/21/2024    5:27 AM 06/20/2024   12:45 PM  CBC  WBC 4.0 - 10.5 K/uL 7.3  8.2    Hemoglobin 12.0 - 15.0 g/dL 7.9  7.3  7.6   Hematocrit 36.0 - 46.0 % 23.7  21.9  22.3   Platelets 150 - 400 K/uL 96  91      BMET    Component Value Date/Time   NA 136 06/22/2024 0520   K 4.2 06/22/2024 0520   CL 98 06/22/2024 0520   CO2 28 06/22/2024 0520   GLUCOSE 148 (H) 06/22/2024 0520   BUN 29 (H) 06/22/2024 0520   CREATININE 1.59 (H) 06/22/2024 0520   CREATININE 1.28 (H) 06/13/2024 0927   CALCIUM 9.2 06/22/2024 0520   GFRNONAA 31 (L) 06/22/2024 0520   GFRNONAA 40 (L) 06/13/2024 0927   GFRAA >60 02/14/2020 1318     Redell Door, MD, FACS, FSVS Covering for [facility]: [phone]  06/22/2024, 9:08 AM       "

## 2024-06-23 LAB — GLUCOSE, CAPILLARY
Glucose-Capillary: 169 mg/dL — ABNORMAL HIGH (ref 70–99)
Glucose-Capillary: 145 mg/dL — ABNORMAL HIGH (ref 70–99)
Glucose-Capillary: 204 mg/dL — ABNORMAL HIGH (ref 70–99)

## 2024-06-23 LAB — CBC
HCT: 26 % — ABNORMAL LOW (ref 36.0–46.0)
Hemoglobin: 8.6 g/dL — ABNORMAL LOW (ref 12.0–15.0)
MCH: 30.4 pg (ref 26.0–34.0)
MCHC: 33.1 g/dL (ref 30.0–36.0)
MCV: 91.9 fL (ref 80.0–100.0)
Platelets: 118 K/uL — ABNORMAL LOW (ref 150–400)
RBC: 2.83 MIL/uL — ABNORMAL LOW (ref 3.87–5.11)
RDW: 14.4 % (ref 11.5–15.5)
WBC: 6.5 K/uL (ref 4.0–10.5)
nRBC: 0 % (ref 0.0–0.2)

## 2024-06-23 LAB — BASIC METABOLIC PANEL WITH GFR
Anion gap: 10 (ref 5–15)
BUN: 29 mg/dL — ABNORMAL HIGH (ref 8–23)
CO2: 29 mmol/L (ref 22–32)
Calcium: 9.3 mg/dL (ref 8.9–10.3)
Chloride: 99 mmol/L (ref 98–111)
Creatinine, Ser: 1.5 mg/dL — ABNORMAL HIGH (ref 0.44–1.00)
GFR, Estimated: 33 mL/min — ABNORMAL LOW
Glucose, Bld: 178 mg/dL — ABNORMAL HIGH (ref 70–99)
Potassium: 4.1 mmol/L (ref 3.5–5.1)
Sodium: 137 mmol/L (ref 135–145)

## 2024-06-23 MED ORDER — HYDROCHLOROTHIAZIDE 25 MG PO TABS
25.0000 mg | ORAL_TABLET | Freq: Every day | ORAL | Status: DC
  Administered 2024-06-23: 25 mg via ORAL
  Filled 2024-06-23: qty 1

## 2024-06-23 NOTE — Plan of Care (Signed)
  Problem: Education: Goal: Understanding of CV disease, CV risk reduction, and recovery process will improve Outcome: Progressing Goal: Individualized Educational Video(s) Outcome: Progressing   Problem: Activity: Goal: Ability to return to baseline activity level will improve Outcome: Progressing   Problem: Cardiovascular: Goal: Ability to achieve and maintain adequate cardiovascular perfusion will improve Outcome: Progressing Goal: Vascular access site(s) Level 0-1 will be maintained Outcome: Progressing   Problem: Health Behavior/Discharge Planning: Goal: Ability to safely manage health-related needs after discharge will improve Outcome: Progressing   Problem: Education: Goal: Knowledge of General Education information will improve Description: Including pain rating scale, medication(s)/side effects and non-pharmacologic comfort measures Outcome: Progressing   

## 2024-06-23 NOTE — Discharge Summary (Signed)
 Physician Discharge Summary  Patient ID: Kim Ballard MRN: [id number] DOB/AGE: [birth year] 89 y.o.  Admit date: 06/18/2024 Discharge date: 06/23/2024  Admission Diagnoses:  Discharge Diagnoses:  Principal Problem:   Perinephric hematoma   Discharged Condition: stable  Hospital Course:  Kim Ballard had R renal artery stenting on 06/18/24.  This was complicated by a perinephric hematoma which was symptomatic.  Patient had embolization of a branch off the distal renal artery on the same day.  Patient was stabilized in the ICU.   Pt did require some transfusion as documented.  Pt stabilized over the next few days, with renal function improving and hemoglobin levels stabilized.  By POD #5, pt had stable labs with tolerateable residual RIGHT flank pain.   Patient's BP improved with her improvement in blood level.   Pt was restarted on her hydrochlorothiazide  and her Losartan was held.  Patient was instructed to contact her PCP this coming week for further titration of BP regimen.  Consults: Int Med  Discharge Exam: Blood pressure (!) 159/68, pulse 91, temperature 97.9 F (36.6 C), resp. rate 16, height 5' 6 (1.676 m), weight 62.6 kg, SpO2 95%. See Program Note dated 06/23/24  Disposition: Discharge disposition: 01-Home or Self Care       Discharge Instructions     Call MD for:  redness, tenderness, or signs of infection (pain, swelling, bleeding, redness, odor or green/yellow discharge around incision site)   Complete by: As directed    Call MD for:  severe or increased pain, loss or decreased feeling  in affected limb(s)   Complete by: As directed    Call MD for:  temperature >100.5   Complete by: As directed    Discharge instructions   Complete by: As directed    Okay to shower after 36 hours.  Please remove the right groin bandage before showering and replace with a Band-Aid daily as needed   Driving Restrictions   Complete by: As directed    No driving for 36 hours    Lifting restrictions   Complete by: As directed    No lifting for 1 week   No dressing needed   Complete by: As directed    Replace only if drainage present   Resume previous diet   Complete by: As directed       Allergies as of 06/23/2024       Reactions   Biaxin [clarithromycin] Other (See Comments)   GI UPSET   Brompheniramine Maleate    Ceclor [cefaclor]    Ciprofloxacin    Clinoril [sulindac]    Codeine Sulfate    Diovan [valsartan]    Doxycycline    Able to tolerate low dose   Erythromycin    Esomeprazole    Fluoxetine Other (See Comments)   UNKNOWN   Guaifenesin & Derivatives    Levaquin [levofloxacin In D5w]    Can take in low doses   Lisinopril    Lodine [etodolac]    Macrodantin [nitrofurantoin Macrocrystal]    Medrol  [methylprednisolone ]    Mobic [meloxicam]    Motrin [ibuprofen]    Nexium [esomeprazole Magnesium ]    Nitrofurantoin Other (See Comments)   Norvasc [amlodipine Besylate]    Omnicef [cefdinir] Other (See Comments)   Developed whelps 10 days laster   Penicillins    Prednisone  Nausea And Vomiting   Severe   Prozac [fluoxetine Hcl] Other (See Comments)   Makes her feel crazy   Reglan [metoclopramide]    Seldane [terfenadine]  Sulfa Antibiotics Other (See Comments)   Toprol Xl [metoprolol Tartrate]    Triamterene    Tussionex Pennkinetic Er [hydrocod Poli-chlorphe Poli Er]    Vibramycin [doxycycline Calcium]    Amlodipine Rash   Other reaction(s): Other (See Comments) Edema Edema   Pseudoephedrine Palpitations        Medication List     STOP taking these medications    losartan 50 MG tablet Commonly known as: COZAAR       TAKE these medications    cholecalciferol  25 MCG (1000 UNIT) tablet Commonly known as: VITAMIN D3 Take 1,000 Units by mouth 2 (two) times daily.   clopidogrel  75 MG tablet Commonly known as: Plavix  Take 1 tablet (75 mg total) by mouth daily.   cyanocobalamin  1000 MCG tablet Commonly known as:  VITAMIN B12 Take 1,000 mcg by mouth daily.   fluticasone 50 MCG/ACT nasal spray Commonly known as: FLONASE Place 2 sprays into both nostrils daily as needed for allergies.   gemfibrozil  600 MG tablet Commonly known as: LOPID  Take 600 mg by mouth 2 (two) times daily before a meal.   glipiZIDE 10 MG tablet Commonly known as: GLUCOTROL Take 10 mg by mouth 2 (two) times daily.   hydrochlorothiazide  25 MG tablet Commonly known as: HYDRODIURIL  Take 25 mg by mouth daily.   iron  polysaccharides 150 MG capsule Commonly known as: NIFEREX Take 150 mg by mouth daily.   metFORMIN 500 MG 24 hr tablet Commonly known as: GLUCOPHAGE-XR Take 1,000 mg by mouth 2 (two) times daily.   multivitamin with minerals tablet Take 1 tablet by mouth daily.   HAIR SKIN & NAILS PO Take by mouth daily.   Omega 3 1000 MG Caps Take 1,000 mg by mouth daily.   oxyCODONE  5 MG immediate release tablet Commonly known as: Roxicodone  Take 1 tablet (5 mg total) by mouth every 6 (six) hours as needed for moderate pain (pain score 4-6) or severe pain (pain score 7-10).   pantoprazole  40 MG tablet Commonly known as: PROTONIX  Take 40 mg by mouth daily.   PROBIOTIC PO Take by mouth daily.   vitamin C 1000 MG tablet Take 1,000 mg by mouth 2 (two) times daily.   vitamin E 180 MG (400 UNITS) capsule Take 400 Units by mouth daily.        Contact information for follow-up providers     Schnier, Cordella MATSU, MD Follow up in 3 week(s).   Specialties: Vascular Surgery, Cardiology, Radiology, Vascular Surgery Why: follow up after procedure no studies Left office a message to call patient for follow up or can call me until patient d/c at 4:15 pm. Contact information: [address] [place] [address] [phone]         Sadie Manna, MD. Schedule an appointment as soon as possible for a visit in 2 day(s).   Specialty: Internal Medicine Why: Call PCP for further titration of  anti-hypertensive regimen. Contact information: [address] [place] [address] [phone]              Contact information for after-discharge care     Home Medical Care     [facility]) .   Service: Home Health Services Contact information: [address] [place]  [address] [phone]                     Signed: Redell Door 06/23/2024, 8:11 AM

## 2024-06-23 NOTE — Plan of Care (Signed)
 IV removed, discharge instructions will be removed when reconciled, patient to be discharged to home with friend.

## 2024-06-23 NOTE — Progress Notes (Addendum)
" °  °  °  Daily Progress Note   Assessment/Planning:   POD #5 s/p R RAS -> perinephric hematoma -> coil embolization of middle branch of distal R renal artery   Pt wants to go home today Cr stable, H/H better SBP 130-180s Add back hydrochlorothiazide ,  Hold Losartan Discussed with patient further rx titration with PCP as outpatient   Subjective  - 5 Days Post-Op   Sill uncomfortable but wants to go home   Objective   Vitals:   06/22/24 2010 06/23/24 0350 06/23/24 0400 06/23/24 0732  BP: (!) 166/52 (!) 181/78 (!) 171/69 (!) 159/68  Pulse: (!) 103 88  91  Resp: 18 18  16   Temp: 99.9 F (37.7 C) 98 F (36.7 C)  97.9 F (36.6 C)  TempSrc:      SpO2: 95% 100%  95%  Weight:      Height:        No intake or output data in the 24 hours ending 06/23/24 0759  PULM  CTAB  CV  RRR  GI  soft, NTND, R flank TTP  NEURO A&O x 3    Laboratory   CBC    Latest Ref Rng & Units 06/23/2024    3:52 AM 06/22/2024    5:20 AM 06/21/2024    5:27 AM  CBC  WBC 4.0 - 10.5 K/uL 6.5  7.3  8.2   Hemoglobin 12.0 - 15.0 g/dL 8.6  7.9  7.3   Hematocrit 36.0 - 46.0 % 26.0  23.7  21.9   Platelets 150 - 400 K/uL 118  96  91     BMET    Component Value Date/Time   NA 137 06/23/2024 0352   K 4.1 06/23/2024 0352   CL 99 06/23/2024 0352   CO2 29 06/23/2024 0352   GLUCOSE 178 (H) 06/23/2024 0352   BUN 29 (H) 06/23/2024 0352   CREATININE 1.50 (H) 06/23/2024 0352   CREATININE 1.28 (H) 06/13/2024 0927   CALCIUM 9.3 06/23/2024 0352   GFRNONAA 33 (L) 06/23/2024 0352   GFRNONAA 40 (L) 06/13/2024 0927   GFRAA >60 02/14/2020 1318     Redell Door, MD, FACS, FSVS Covering for [facility]: [phone]  06/23/2024, 7:59 AM       "

## 2024-06-28 NOTE — Progress Notes (Signed)
 Chief Complaint  Patient presents with   Annual Exam    HPI  Kim Ballard is a 89 y.o. here for an Annual Physical  C/o Diarrhea x 6 days associated with lower abdominal pain  Underwent Right renal artery stent on 06/18/24 complicated by Peri nephric hematoma  Underwent Kyphoplasty twice in the last few months and also had a fracture of left wrist   Left calf swelling has improved to some extent  Continues to experience Abdominal bloating and epigastric discomfort  EGD: Small sub mucosal nodule sub glottic space and had dilatation of distal esophagus   Denies  Chest pains or  Shortness of breath.  Recent labs showed Hgb of 10.7 , Platelets :169 Sugar :78 A1c; 7.0  BUN; 33 Se Creat: 0.9  (EGFR: 62) ,Total Cholesterol: 174  Triglycerides; 200 and TSH: 1.500 and evidence for Proteinuria  Rest of 10 point review of systems is normal    Outpatient Encounter Medications as of 06/28/2024  Medication Sig Dispense Refill   albuterol MDI, PROVENTIL, VENTOLIN, PROAIR, HFA 90 mcg/actuation inhaler Inhale 2 inhalations into the lungs every 6 (six) hours as needed for Wheezing 1 each 2   ascorbic acid, vitamin C, (VITAMIN C) 1000 MG tablet Take 1,000 mg by mouth once daily     cholecalciferol  (VITAMIN D3) 1000 unit tablet Take 1 tablet by mouth once daily     clidinium-chlordiazePOXIDE (LIBRAX) 5-2.5 mg capsule Take 1 capsule by mouth 2 (two) times daily as needed 30 capsule 5   clopidogreL  (PLAVIX ) 75 mg tablet Take 75 mg by mouth once daily     CRANBERRY EXTRACT (CRANBERRY ORAL) Take 2 tablets by mouth 2 (two) times daily     CYANOCOBALAMIN , VITAMIN B-12, (VITAMIN B-12 ORAL) Take 2,000 mcg by mouth once daily       dicyclomine (BENTYL) 10 mg capsule 2 capsules     fluticasone propionate (FLONASE) 50 mcg/actuation nasal spray Place 1 spray into both nostrils 2 (two) times daily 1 g 5   gemfibroziL  (LOPID ) 600 mg tablet Take 1 tablet (600 mg total) by mouth 2 (two) times daily before  meals for 180 days 180 tablet 1   glipiZIDE (GLUCOTROL) 10 MG tablet Take 1 tablet (10 mg total) by mouth 2 (two) times daily before meals 180 tablet 1   hydroCHLOROthiazide  (HYDRODIURIL ) 25 MG tablet Take 1 tablet by mouth once daily 90 tablet 1   IFEREX 150 150 mg iron  capsule Take 1 capsule by mouth once daily 90 capsule 1   insulin  GLARGINE (LANTUS U-100 INSULIN ) injection (concentration 100 units/mL) 20 units qd 10 mL 3   Lactobacillus acidophilus 10 billion cell Cap Take 1 capsule by mouth once daily.     lidocaine  4 % Gel Apply 1 Application topically 3 (three) times a day 120 mL 1   metFORMIN (GLUCOPHAGE-XR) 500 MG XR tablet Take 2 tablets by mouth twice daily 360 tablet 0   MULTIVITAMIN W-MINERALS/LUTEIN (CENTRUM SILVER ORAL) Take 1 caplet by mouth once daily     omega-3 fatty acids-vitamin E 1,000 mg Take 1 capsule by mouth 2 (two) times daily.       pantoprazole  (PROTONIX ) 40 MG DR tablet 1 tablet Orally Once a day; Duration: 30 day(s)     SITagliptin phosphate (JANUVIA) 50 MG tablet Take 1 tablet (50 mg total) by mouth once daily 30 tablet 2   blood glucose diagnostic (TRUE METRIX GLUCOSE TEST STRIP) test strip 3 (three) times daily for 180 days Use as instructed.  300 strip 1   blood glucose meter kit 3 (three) times daily 1 each 0   lancets (TRUEPLUS LANCETS) 3 (three) times daily for 180 days Use as instructed. 300 each 1   lancets 33 gauge Misc Use 1 each 3 (three) times daily 300 each 3   losartan (COZAAR) 100 MG tablet Take 1 tablet (100 mg total) by mouth once daily 30 tablet 1   No facility-administered encounter medications on file as of 06/28/2024.    Allergies as of 06/28/2024 - Reviewed 06/28/2024  Allergen Reaction Noted   Dyrenium [triamterene] Headache 08/26/2015   Fluoxetine hcl Other (See Comments) 08/26/2015   Omnicef [cefdinir] Hives 08/26/2015   Dextromethorphan-guaifenesin Other (See Comments) 02/13/2024   Metoclopramide Other (See  Comments) 02/13/2024   Oxycodone  Itching 06/28/2024   Biaxin [clarithromycin] Other (See Comments) 08/26/2015   Brompheniramine maleate Other (See Comments) 05/14/2015   Ceclor [cefaclor] Other (See Comments) 08/26/2015   Cipro [ciprofloxacin] Nausea 08/26/2015   Clinoril [sulindac] Cough 08/26/2015   Decongestant [pseudoephedrine hcl] Other (See Comments)    Diovan [valsartan] Headache 08/26/2015   Doxycycline calcium Other (See Comments) 05/14/2015   Erythromycin (bulk) Other (See Comments)    Esomeprazole sodium Headache 05/14/2015   Guaifenesin Other (See Comments) 05/14/2015   Levaquin [levofloxacin] Itching and Muscle Pain 11/04/2013   Lisinopril Cough 08/26/2015   Lodine [etodolac] Other (See Comments) 08/26/2015   Macrodantin [nitrofurantoin macrocrystalline] Nausea 10/25/2013   Medrol  [methylprednisolone ] Headache 08/26/2015   Mobic [meloxicam] Headache 08/26/2015   Motrin [ibuprofen] Other (See Comments) 08/26/2015   Nexium [esomeprazole magnesium ] Headache    Norvasc [amlodipine] Other (See Comments) 08/26/2015   Penicillins Itching 08/26/2015   Prozac [fluoxetine] Other (See Comments) 08/26/2015   Pseudoephedrine Other (See Comments) 08/26/2015   Sulfa (sulfonamide antibiotics) Rash    Toprol xl [metoprolol succinate] Headache 08/26/2015   Tussi-organidin nr [codeine-guaifenesin] Nausea    Zoloft [sertraline] Nausea 09/21/2015    Past Medical History:  Diagnosis Date   Abdominal pain, RUQ (right upper quadrant) 07/30/2015   Allergic state    Change in bowel habits 01/22/2019   Depression    Diabetes mellitus type 2, uncomplicated (CMS/HHS-HCC)    Fibrocystic breast disease    Fibromyalgia    Gastroesophageal reflux disease without esophagitis 07/30/2015   GERD (gastroesophageal reflux disease)    History of bone density study 11/25/2010   History of skin cancer    Arm, Neck and left side of face.   History of urinary tract  infection    Hx of adenomatous colonic polyps 01/22/2019   Hypertension    IBS (irritable bowel syndrome)    Migraine    Nausea 07/30/2015   Osteoarthritis    Pancytopenia (CMS-HCC) 08/23/2016   Thrombocytopenia () 06/29/2017    Past Surgical History:  Procedure Laterality Date   COLONOSCOPY  11/03/2009   Fundic Gland Polyp: CBF 10/2014; Recall Ltr mailed 07/31/2014 (dw)   EGD  11/03/2009   No repeat per RTE   COLONOSCOPY  08/27/2015   Tubulovillous Adenoma w/High-Grade Dysplasia: CBF 02/2016; Recall Ltr mailed 12/21/2015 (dw)   EGD  08/27/2015   No repeat per RTE   COLONOSCOPY  05/02/2016   Adenomatous Polyps: No repeat due to age per RTE (dw)   CATARACT EXTRACTION W/ INTRAOCULAR LENS IMPLANT & ANTERIOR VITRECTOMY, BILATERAL Bilateral 04/2017   Colon @ [facility]  10/05/2022   Tubular adenomas/No repeat/TKT   EGD @ [facility]  10/05/2022   Normal EGD biopsy/No repeat/TKT   APPENDECTOMY     CHOLECYSTECTOMY  ENDOSCOPY OF BILIARY DUCT     HERNIA REPAIR     HYSTERECTOMY     Skin cancer removal     Left arm, neck x2, and left side of face.    Wt Readings from Last 3 Encounters:  06/28/24 64.9 kg (143 lb)  06/07/24 64.8 kg (142 lb 12.8 oz)  05/28/24 64.2 kg (141 lb 9.6 oz)    Wt Readings from Last 3 Encounters:  06/28/24 64.9 kg (143 lb)  06/07/24 64.8 kg (142 lb 12.8 oz)  05/28/24 64.2 kg (141 lb 9.6 oz)   Exam Blood pressure (!) 184/78, pulse 99, weight 64.9 kg (143 lb), SpO2 99%.     Wt Readings from Last 3 Encounters:  06/28/24 64.9 kg (143 lb)  06/07/24 64.8 kg (142 lb 12.8 oz)  05/28/24 64.2 kg (141 lb 9.6 oz)      Blood pressure (!) 160/78, pulse 99, weight 64.9 kg (143 lb), SpO2 99%.   VS reviewed     Thin built Pale mucosa In moderate pain. Tearful Eyes. Sclera and conjunctiva clear; Vision grossly intact; extraocular movements intact Oropharynx. Normal  Neck. Supple. No swelling, masses, thyroid  normal size, no masses palpated.   No  swelling or erythema  Bruit noted Left brachial artery  Lungs. Respirations unlabored; CTA  Cardiovascular. Heart regular rate and rhythm without murmurs, gallops, or rubs Abdomen: Soft.-Tenderness + Rt upper quadrant area  No masses felt. BS +  PELVIC: Declined  Varicose veins noted  Spine; Tenderness + Lumbar spine and posterior ribs  Neurologic. Alert and oriented x3; CN 2-12 grossly intact; no focal deficits  Assessment and Plan:  1 Diarrhea with dark stool;Check stool for WBC's, culture, and C. Diff colitis. Rec: Po Vancomycin  250 mg po qid x 10 days  Increase fluid intake If no better- go to ER  2 Hx of Right renal artery stent with perinephric hematoma ; Follow up with Vascular  3 GERD; On Pantoprazole  to 40 mg po qd Had  esophagus dilatated in the past  4 HTN;On HCTZ Unable to tolerate Amlodipine  Low sodium diet 5 Type 2 DM with hyperglycemia  (A1c; 7.0 ) On Glipizide to 10 mg in AM and 10  mg in PM and Metformin 1000 mg po twice a day  States she is experiencing side effects of Metformin (Bloating and loose stools). Has not started Januvia as yet  Continue to monitor sugars  Low carb diet  Increase fluid intake  6 HJI:Ujxzd Librax as needed 7 Hypersplenism/ Anemia; Hgb is 10.7  ,/ Platelets;169 Sees Dr. Rennie Has received  iron  infusions in the past  8 Melanoma:  Had skin lesion excised from Lt side of neck  and Rt ear (Dr. Chrystie)  9 CKD - 3 A/ Proteinuria: On Losartan 25 mg po qd 10 Health maintenance:  No need for pap because of Hysterectomy. Mammogram-ok.-May 2025 - Negative  Wants to hold off on Flu shot  Up to date with  COVID 19 vaccine  Colonoscopy-2011. Divertics - Single polyp- internal hemorrhoids Repeated in 2017 1 polyp in cecum Repeat colonoscopy in Nov 2017: 2 polyps, Divertics and hemorrhoids  Repeated May 2024; 4 polyps, Diverticulosis  DEXA scan : Osteopenia  Labs today  Follow up as scheduled       Tamra Leventhal  MD

## 2024-07-22 ENCOUNTER — Ambulatory Visit (INDEPENDENT_AMBULATORY_CARE_PROVIDER_SITE_OTHER): Admitting: Vascular Surgery

## 2024-08-14 ENCOUNTER — Inpatient Hospital Stay

## 2024-08-14 ENCOUNTER — Inpatient Hospital Stay: Admitting: Internal Medicine

## 2024-08-15 ENCOUNTER — Inpatient Hospital Stay: Admitting: Internal Medicine

## 2024-08-15 ENCOUNTER — Inpatient Hospital Stay

## 2024-11-05 ENCOUNTER — Inpatient Hospital Stay

## 2024-11-05 ENCOUNTER — Inpatient Hospital Stay: Admitting: Nurse Practitioner
